# Patient Record
Sex: Female | Born: 1960 | Race: Black or African American | Hispanic: No | Marital: Married | Smoking: Current some day smoker
Health system: Southern US, Community
[De-identification: ages and names within clinical notes are randomized; demographics above are authoritative.]

## PROBLEM LIST (undated history)

## (undated) ENCOUNTER — Emergency Department (HOSPITAL_COMMUNITY): Admission: EM | Payer: Medicare Other | Source: Home / Self Care

## (undated) DIAGNOSIS — I10 Essential (primary) hypertension: Secondary | ICD-10-CM

## (undated) DIAGNOSIS — I5032 Chronic diastolic (congestive) heart failure: Secondary | ICD-10-CM

## (undated) DIAGNOSIS — F32A Depression, unspecified: Secondary | ICD-10-CM

## (undated) DIAGNOSIS — M1990 Unspecified osteoarthritis, unspecified site: Secondary | ICD-10-CM

## (undated) DIAGNOSIS — N189 Chronic kidney disease, unspecified: Secondary | ICD-10-CM

## (undated) DIAGNOSIS — J449 Chronic obstructive pulmonary disease, unspecified: Secondary | ICD-10-CM

## (undated) DIAGNOSIS — E0590 Thyrotoxicosis, unspecified without thyrotoxic crisis or storm: Secondary | ICD-10-CM

## (undated) DIAGNOSIS — E039 Hypothyroidism, unspecified: Secondary | ICD-10-CM

## (undated) DIAGNOSIS — I219 Acute myocardial infarction, unspecified: Secondary | ICD-10-CM

## (undated) DIAGNOSIS — F419 Anxiety disorder, unspecified: Secondary | ICD-10-CM

## (undated) DIAGNOSIS — F329 Major depressive disorder, single episode, unspecified: Secondary | ICD-10-CM

## (undated) DIAGNOSIS — G459 Transient cerebral ischemic attack, unspecified: Secondary | ICD-10-CM

## (undated) DIAGNOSIS — E7800 Pure hypercholesterolemia, unspecified: Secondary | ICD-10-CM

## (undated) HISTORY — PX: OTHER SURGICAL HISTORY: SHX169

## (undated) HISTORY — DX: Pure hypercholesterolemia, unspecified: E78.00

## (undated) HISTORY — DX: Transient cerebral ischemic attack, unspecified: G45.9

## (undated) HISTORY — DX: Thyrotoxicosis, unspecified without thyrotoxic crisis or storm: E05.90

## (undated) HISTORY — DX: Depression, unspecified: F32.A

## (undated) HISTORY — DX: Essential (primary) hypertension: I10

## (undated) HISTORY — DX: Anxiety disorder, unspecified: F41.9

## (undated) HISTORY — DX: Acute myocardial infarction, unspecified: I21.9

## (undated) HISTORY — DX: Major depressive disorder, single episode, unspecified: F32.9

## (undated) HISTORY — PX: BREAST CYST EXCISION: SHX579

## (undated) HISTORY — PX: THYROIDECTOMY: SHX17

## (undated) HISTORY — DX: Chronic obstructive pulmonary disease, unspecified: J44.9

## (undated) HISTORY — PX: KNEE SURGERY: SHX244

## (undated) HISTORY — PX: COLONOSCOPY: SHX174

## (undated) HISTORY — DX: Chronic kidney disease, unspecified: N18.9

---

## 1997-08-01 HISTORY — PX: CORONARY STENT PLACEMENT: SHX1402

## 2013-08-01 DIAGNOSIS — N200 Calculus of kidney: Secondary | ICD-10-CM

## 2013-08-01 HISTORY — DX: Calculus of kidney: N20.0

## 2014-12-03 ENCOUNTER — Telehealth: Payer: Self-pay | Admitting: Cardiology

## 2014-12-03 NOTE — Telephone Encounter (Signed)
Received records from [place] for appointment with Dr Martinique on 01/16/15.  Records given to [facility] (medical records) for Dr Doug Sou schedule on 01/16/15. lp

## 2015-01-14 ENCOUNTER — Ambulatory Visit (INDEPENDENT_AMBULATORY_CARE_PROVIDER_SITE_OTHER): Payer: Medicaid Other | Admitting: Cardiology

## 2015-01-14 ENCOUNTER — Encounter: Payer: Self-pay | Admitting: Cardiology

## 2015-01-14 VITALS — BP 120/60 | HR 73 | Ht 64.0 in | Wt 193.0 lb

## 2015-01-14 DIAGNOSIS — I2510 Atherosclerotic heart disease of native coronary artery without angina pectoris: Secondary | ICD-10-CM | POA: Insufficient documentation

## 2015-01-14 DIAGNOSIS — E7800 Pure hypercholesterolemia, unspecified: Secondary | ICD-10-CM

## 2015-01-14 DIAGNOSIS — I5022 Chronic systolic (congestive) heart failure: Secondary | ICD-10-CM

## 2015-01-14 DIAGNOSIS — Z720 Tobacco use: Secondary | ICD-10-CM | POA: Diagnosis not present

## 2015-01-14 DIAGNOSIS — I25118 Atherosclerotic heart disease of native coronary artery with other forms of angina pectoris: Secondary | ICD-10-CM | POA: Diagnosis not present

## 2015-01-14 DIAGNOSIS — I10 Essential (primary) hypertension: Secondary | ICD-10-CM | POA: Diagnosis not present

## 2015-01-14 MED ORDER — ISOSORBIDE MONONITRATE ER 30 MG PO TB24: 30.0000 mg | ORAL_TABLET | Freq: Every day | ORAL | Status: DC

## 2015-01-14 NOTE — Patient Instructions (Addendum)
Continue your current therapy  Start Imdur 30 mg daily  Stop smoking  We will schedule you for an Echocardiogram  We will request your lab work from primary care and the cath films and records from [place].  Dr Martinique recommends that you schedule a follow-up appointment in 2 months.

## 2015-01-14 NOTE — Progress Notes (Addendum)
Cardiology Office Note   Date:  01/14/2015   ID:  Jocelyn [name], DOB [birth year], MRN [id number]  PCP:  Benito Mccreedy, MD  Cardiologist:   Jacalyn Biggs Martinique, MD   Chief Complaint  Patient presents with  . Chest Pain      History of Present Illness: Jocelyn [name] is a 54 y.o. female who presents for evaluation of chest pain. She has a history of CAD with prior MI in 1999. She apparently had a stent placed at that time. She reports several heart cath since then at several places but she cannot remember details. Was admitted in February of this year in [place] and reports cardiac cath showed occlusion of the vessel that was stented with collateral flow. Medical therapy was recommended. She reports symptoms of chest heaviness, SOB, and left shoulder and arm pain. Not really related to activity. Some relief with sl Ntg. She states she has been told she has CHF. EF is unknown. She also reports a history of TIA 1.5 years ago. She has a history of HTN, hypercholesterolemia, and tobacco abuse. Still smokes at least 0.5 pk/day. She does tire easily. She is under a lot of stress with recent separation from her husband. No edema.   Past Medical History  Diagnosis Date  . Myocardial infarction   . Thyrotoxicosis   . TIA (transient ischemic attack)   . HTN (hypertension)   . Hypercholesterolemia   . CHF (congestive heart failure)     Past Surgical History  Procedure Laterality Date  . Partial hysterec    . Knee surgery    . Thyroidectomy    . Breast cyst excision    . Coronary stent placement  1999     Current Outpatient Prescriptions  Medication Sig Dispense Refill  . ALPRAZolam (XANAX) 1 MG tablet 1 mg.  0  . aspirin 325 MG tablet Take 325 mg by mouth daily.    Marland Kitchen doxepin (SINEQUAN) 10 MG capsule 10 mg.  0  . estradiol (ESTRACE) 0.5 MG tablet Take 0.5 mg by mouth daily.  1  . FLUoxetine (PROZAC) 10 MG capsule Take 10 mg by mouth.  0  . furosemide (LASIX) 40 MG tablet Take 40 mg by  mouth daily.  1  . KLOR-CON M10 10 MEQ tablet Take 10 mEq by mouth 2 (two) times daily.  0  . metoprolol tartrate (LOPRESSOR) 25 MG tablet Take 12.5 mg by mouth 2 (two) times daily.  2  . simvastatin (ZOCOR) 20 MG tablet Take 20 mg by mouth at bedtime.  1  . spironolactone (ALDACTONE) 25 MG tablet Take 25 mg by mouth 2 (two) times daily.  1  . isosorbide mononitrate (IMDUR) 30 MG 24 hr tablet Take 1 tablet (30 mg total) by mouth daily. 30 tablet 11   No current facility-administered medications for this visit.    Allergies:   Review of patient's allergies indicates not on file.    Social History:  The patient  reports that she has been smoking Cigarettes.  She has been smoking about 0.50 packs per day. She does not have any smokeless tobacco history on file.   Family History:  The patient's family history is significant for CAD and HTN.   ROS:  Please see the history of present illness.   Otherwise, review of systems are positive for none.   All other systems are reviewed and negative.    PHYSICAL EXAM: VS:  BP 120/60 mmHg  Pulse 73  Ht 5\' 4"  (1.626 m)  Wt 87.544 kg (193 lb)  BMI 33.11 kg/m2 , BMI Body mass index is 33.11 kg/(m^2). GEN: Well nourished, well developed, in no acute distress HEENT: normal Neck: no JVD, carotid bruits, or masses Cardiac: RRR; no murmurs, rubs, or gallops,no edema  Respiratory:  clear to auscultation bilaterally, normal work of breathing GI: soft, nontender, nondistended, + BS MS: no deformity or atrophy Skin: warm and dry, no rash Neuro:  Strength and sensation are intact Psych: euthymic mood, full affect   EKG:  EKG is ordered today. The ekg ordered today demonstrates NSR with early transition. Otherwise normal. I have personally reviewed and interpreted this study.    Recent Labs: No results found for requested labs within last 365 days.    Lipid Panel No results found for: CHOL, TRIG, HDL, CHOLHDL, VLDL, LDLCALC, LDLDIRECT    Wt  Readings from Last 3 Encounters:  01/14/15 87.544 kg (193 lb)      Other studies Reviewed: Additional studies/ records that were reviewed today include: none. Review of the above records demonstrates: N/A   ASSESSMENT AND PLAN:  1.  CAD with class 2 symptoms of angina. On lopressor. Will add Imdur 30 mg daily. Request records from last heart cath including cath films to review to see if she would be a candidate for CTO PCI. Continue statin and ASA therapy  2. Hypercholesterolemia. Request most recent lab work from primary care. On statin.   3. Hypertension. Controlled  4. Tobacco abuse. Counseled on smoking cessation.  5. CHF chronic. EF is unknown. Will schedule for Echocardiogram. Continue beta blocker, lasix, and aldactone. If EF low consider ACEi/ ARB. She does not appear volume overloaded today.   Current medicines are reviewed at length with the patient today.  The patient does not have concerns regarding medicines.  The following changes have been made:  See above  Labs/ tests ordered today include:  Orders Placed This Encounter  Procedures  . EKG 12-Lead  . ECHOCARDIOGRAM COMPLETE     Disposition:   FU with me  in 2 months  Signed, Faydra Korman Martinique, [place]  01/14/2015 3:28 PM    [facility] [address] [place], [place], [address] Phone [phone], Fax [phone]   I received and reviewed cardiac cath films from [facility] center from 10/29/14. She has occlusion of the first OM. There are some left to left collaterals. The vessel is small and diffusely diseased. Otherwise no significant disease. This does not appear suitable for CTO PCI due to diffusely diseased distal vessel.  Gennifer Potenza Martinique MD, [facility]  06/10/2015 2:28 PM

## 2015-01-16 ENCOUNTER — Ambulatory Visit: Payer: Self-pay | Admitting: Cardiology

## 2015-01-19 ENCOUNTER — Inpatient Hospital Stay (HOSPITAL_COMMUNITY): Admission: RE | Admit: 2015-01-19 | Payer: Medicaid Other | Source: Ambulatory Visit

## 2015-01-23 ENCOUNTER — Telehealth (HOSPITAL_COMMUNITY): Payer: Self-pay | Admitting: *Deleted

## 2015-01-27 ENCOUNTER — Telehealth (HOSPITAL_COMMUNITY): Payer: Self-pay | Admitting: *Deleted

## 2015-02-09 ENCOUNTER — Ambulatory Visit (HOSPITAL_COMMUNITY)
Admission: RE | Admit: 2015-02-09 | Discharge: 2015-02-09 | Disposition: A | Discharge status: Home / Self Care | Payer: 59 | Source: Ambulatory Visit | Attending: Cardiovascular Disease | Admitting: Cardiovascular Disease

## 2015-02-09 DIAGNOSIS — I10 Essential (primary) hypertension: Secondary | ICD-10-CM

## 2015-02-09 DIAGNOSIS — I25118 Atherosclerotic heart disease of native coronary artery with other forms of angina pectoris: Secondary | ICD-10-CM | POA: Diagnosis not present

## 2015-02-09 DIAGNOSIS — I2510 Atherosclerotic heart disease of native coronary artery without angina pectoris: Secondary | ICD-10-CM | POA: Diagnosis not present

## 2015-02-09 DIAGNOSIS — I5022 Chronic systolic (congestive) heart failure: Secondary | ICD-10-CM

## 2015-02-11 ENCOUNTER — Telehealth: Payer: Self-pay | Admitting: Cardiology

## 2015-02-11 NOTE — Telephone Encounter (Signed)
Received records from [facility] however did not receive disc of Echo images that was requested per Dr Martinique..  Re Faxed and ask when we would receive.  lp

## 2015-02-11 NOTE — Telephone Encounter (Signed)
Re faxed release to get Disc for upcoming appointment on 03/18/15 with Dr Martinique.

## 2015-02-11 NOTE — Telephone Encounter (Signed)
Received records from [facility] for appointment on 03/18/15 with Dr Martinique.  Records given to C. Pugh, RN for Dr Doug Sou schedule on 02/11/15. lp

## 2015-03-06 ENCOUNTER — Encounter: Payer: Self-pay | Admitting: *Deleted

## 2015-03-18 ENCOUNTER — Encounter: Payer: Self-pay | Admitting: Cardiology

## 2015-03-18 ENCOUNTER — Ambulatory Visit (INDEPENDENT_AMBULATORY_CARE_PROVIDER_SITE_OTHER): Payer: Medicaid Other | Admitting: Cardiology

## 2015-03-18 VITALS — BP 106/70 | HR 81 | Ht 63.0 in | Wt 187.0 lb

## 2015-03-18 DIAGNOSIS — Z720 Tobacco use: Secondary | ICD-10-CM | POA: Diagnosis not present

## 2015-03-18 DIAGNOSIS — E7800 Pure hypercholesterolemia, unspecified: Secondary | ICD-10-CM

## 2015-03-18 DIAGNOSIS — I25118 Atherosclerotic heart disease of native coronary artery with other forms of angina pectoris: Secondary | ICD-10-CM | POA: Diagnosis not present

## 2015-03-18 DIAGNOSIS — I10 Essential (primary) hypertension: Secondary | ICD-10-CM | POA: Diagnosis not present

## 2015-03-18 NOTE — Progress Notes (Signed)
Cardiology Office Note   Date:  03/18/2015   ID:  Jocelyn [name], DOB [birth year], MRN [id number]  PCP:  Benito Mccreedy, MD  Cardiologist:   Veva Grimley Martinique, MD   Chief Complaint  Patient presents with  . Follow-up    2 mo/one time visit  . Chest Pain    running down left arm/ burning feeling in chest area, pain in arm  . Shortness of Breath    constant  . Dizziness    frequent dizziness, can happen even when just sitting  . Fatigue    pt states 98% of the time  . Weight Loss    has lost 70 lbs in 6 months/ pt states shes not trying to lose weight      History of Present Illness: Jocelyn [name] is a 54 y.o. female is seen for follow up CAD. She has a history of CAD with prior MI in 45. She  had a stent placed at that time. She reports several heart cath since then at several places but she cannot remember details. Was admitted in March of this year in [place]. She had an Echo that showed mild inferior HK with normal EF. Myoview showed a lateral scar with some ischemia in the inferior wall. EF 55%. Cardiac cath showed occlusion of the first OM at site of prior stent. There were some left to left collaterals.  Medical therapy was recommended.  On follow up today she reports symptoms of chest pain intermittently. This is described as burning in her throat, chest, and left arm. Not really related to activity. She has not tried sl Ntg. Takes antacids and states discomfort only lasts 3 minutes.  She states she has been told she has CHF.  She also reports a history of TIA 1.5 years ago. She has a history of HTN, hypercholesterolemia, and tobacco abuse. Still smokes at least 0.5 pk/day. She does tire easily. She is under a lot of stress with recent separation from her husband. No edema.   Past Medical History  Diagnosis Date  . Myocardial infarction   . Thyrotoxicosis   . TIA (transient ischemic attack)   . HTN (hypertension)   . Hypercholesterolemia   . CHF (congestive heart failure)      Past Surgical History  Procedure Laterality Date  . Partial hysterec    . Knee surgery    . Thyroidectomy    . Breast cyst excision    . Coronary stent placement  1999     Current Outpatient Prescriptions  Medication Sig Dispense Refill  . ALPRAZolam (XANAX) 0.5 MG tablet Take 1 tablet by mouth 3 (three) times daily.  1  . aspirin 325 MG tablet Take 325 mg by mouth daily.    Marland Kitchen doxepin (SINEQUAN) 10 MG capsule 10 mg.  0  . estradiol (ESTRACE) 0.5 MG tablet Take 0.5 mg by mouth daily.  1  . FLUoxetine (PROZAC) 10 MG capsule Take 10 mg by mouth.  0  . isosorbide mononitrate (IMDUR) 30 MG 24 hr tablet Take 1 tablet (30 mg total) by mouth daily. 30 tablet 11  . metoprolol tartrate (LOPRESSOR) 25 MG tablet Take 12.5 mg by mouth 2 (two) times daily.  2  . simvastatin (ZOCOR) 20 MG tablet Take 20 mg by mouth at bedtime.  1  . spironolactone (ALDACTONE) 25 MG tablet Take 25 mg by mouth 2 (two) times daily.  1   No current facility-administered medications for this visit.    Allergies:   Review  of patient's allergies indicates not on file.    Social History:  The patient  reports that she has been smoking Cigarettes.  She has been smoking about 0.50 packs per day. She does not have any smokeless tobacco history on file.   Family History:  The patient's family history is significant for CAD and HTN.   ROS:  Please see the history of present illness.   Otherwise, review of systems are positive for none.   All other systems are reviewed and negative.    PHYSICAL EXAM: VS:  BP 106/70 mmHg  Pulse 81  Ht 5\' 3"  (1.6 m)  Wt 84.823 kg (187 lb)  BMI 33.13 kg/m2 , BMI Body mass index is 33.13 kg/(m^2). GEN: Well nourished, well developed, in no acute distress HEENT: normal Neck: no JVD, carotid bruits, or masses Cardiac: RRR; no murmurs, rubs, or gallops,no edema  Respiratory:  clear to auscultation bilaterally, normal work of breathing GI: soft, nontender, nondistended, + BS MS: no  deformity or atrophy Skin: warm and dry, no rash Neuro:  Strength and sensation are intact Psych: euthymic mood, full affect   EKG:  EKG is ordered today. The ekg ordered today demonstrates NSR with early transition. Otherwise normal. I have personally reviewed and interpreted this study.    Recent Labs: No results found for requested labs within last 365 days.    Lipid Panel No results found for: CHOL, TRIG, HDL, CHOLHDL, VLDL, LDLCALC, LDLDIRECT    Wt Readings from Last 3 Encounters:  03/18/15 84.823 kg (187 lb)  01/14/15 87.544 kg (193 lb)      Other studies Reviewed: Additional studies/ records that were reviewed today include: Complete hospital records from [place]. Review of the above records demonstrates: as noted above. Also BNP was 116. LV EDP was 10 at time of cath.    ASSESSMENT AND PLAN:  1.  CAD with class 2 symptoms of angina. On lopressor and  Imdur 30 mg daily. Request films from last heart cath  to review to see if she would be a candidate for CTO PCI but given that this was an OM branch and previously stented it will most likely be treated medically. Continue statin and ASA therapy  2. Hypercholesterolemia.  On statin.   3. Hypertension. Controlled  4. Tobacco abuse. Counseled on smoking cessation.  5. ? History of CHF. Echo is really normal. She states she has taken lasix since the 1990s but I doubt this is needed. Will stop lasix and potassium. Continue aldactone for now.    Current medicines are reviewed at length with the patient today.  The patient does not have concerns regarding medicines.  The following changes have been made:  See above  Labs/ tests ordered today include:  No orders of the defined types were placed in this encounter.     Disposition:   FU with me  in 6  months  Signed, Eberardo Demello Martinique, [place]  03/18/2015 5:24 PM    [facility] [address], [place], [place], [address] Phone [phone], Fax  [phone]

## 2015-03-18 NOTE — Patient Instructions (Signed)
Stop taking lasix and potassium  Continue your other medications  I will see you in 6 months

## 2015-08-21 ENCOUNTER — Emergency Department (HOSPITAL_COMMUNITY): Payer: Medicaid Other

## 2015-08-21 ENCOUNTER — Inpatient Hospital Stay (HOSPITAL_COMMUNITY)
Admission: EM | Admit: 2015-08-21 | Discharge: 2015-08-24 | DRG: 287 | Disposition: A | Discharge status: Home / Self Care | Payer: Medicaid Other | Attending: Cardiology | Admitting: Cardiology

## 2015-08-21 ENCOUNTER — Encounter (HOSPITAL_COMMUNITY): Payer: Self-pay | Admitting: *Deleted

## 2015-08-21 DIAGNOSIS — I252 Old myocardial infarction: Secondary | ICD-10-CM | POA: Diagnosis not present

## 2015-08-21 DIAGNOSIS — Z8673 Personal history of transient ischemic attack (TIA), and cerebral infarction without residual deficits: Secondary | ICD-10-CM | POA: Diagnosis not present

## 2015-08-21 DIAGNOSIS — Z720 Tobacco use: Secondary | ICD-10-CM | POA: Diagnosis present

## 2015-08-21 DIAGNOSIS — I25118 Atherosclerotic heart disease of native coronary artery with other forms of angina pectoris: Secondary | ICD-10-CM

## 2015-08-21 DIAGNOSIS — I509 Heart failure, unspecified: Secondary | ICD-10-CM | POA: Diagnosis present

## 2015-08-21 DIAGNOSIS — Z7982 Long term (current) use of aspirin: Secondary | ICD-10-CM | POA: Diagnosis not present

## 2015-08-21 DIAGNOSIS — I10 Essential (primary) hypertension: Secondary | ICD-10-CM | POA: Diagnosis not present

## 2015-08-21 DIAGNOSIS — E039 Hypothyroidism, unspecified: Secondary | ICD-10-CM | POA: Diagnosis present

## 2015-08-21 DIAGNOSIS — E7800 Pure hypercholesterolemia, unspecified: Secondary | ICD-10-CM | POA: Diagnosis present

## 2015-08-21 DIAGNOSIS — F17210 Nicotine dependence, cigarettes, uncomplicated: Secondary | ICD-10-CM | POA: Diagnosis present

## 2015-08-21 DIAGNOSIS — I200 Unstable angina: Secondary | ICD-10-CM | POA: Diagnosis not present

## 2015-08-21 DIAGNOSIS — R079 Chest pain, unspecified: Secondary | ICD-10-CM | POA: Diagnosis not present

## 2015-08-21 DIAGNOSIS — I472 Ventricular tachycardia: Secondary | ICD-10-CM | POA: Diagnosis present

## 2015-08-21 DIAGNOSIS — I110 Hypertensive heart disease with heart failure: Secondary | ICD-10-CM | POA: Diagnosis present

## 2015-08-21 DIAGNOSIS — Z79899 Other long term (current) drug therapy: Secondary | ICD-10-CM | POA: Diagnosis not present

## 2015-08-21 DIAGNOSIS — Z7951 Long term (current) use of inhaled steroids: Secondary | ICD-10-CM | POA: Diagnosis not present

## 2015-08-21 DIAGNOSIS — I25110 Atherosclerotic heart disease of native coronary artery with unstable angina pectoris: Secondary | ICD-10-CM | POA: Diagnosis present

## 2015-08-21 DIAGNOSIS — E785 Hyperlipidemia, unspecified: Secondary | ICD-10-CM | POA: Diagnosis present

## 2015-08-21 DIAGNOSIS — T82855A Stenosis of coronary artery stent, initial encounter: Secondary | ICD-10-CM | POA: Diagnosis present

## 2015-08-21 DIAGNOSIS — E0590 Thyrotoxicosis, unspecified without thyrotoxic crisis or storm: Secondary | ICD-10-CM | POA: Diagnosis present

## 2015-08-21 DIAGNOSIS — Y838 Other surgical procedures as the cause of abnormal reaction of the patient, or of later complication, without mention of misadventure at the time of the procedure: Secondary | ICD-10-CM | POA: Diagnosis present

## 2015-08-21 DIAGNOSIS — I2510 Atherosclerotic heart disease of native coronary artery without angina pectoris: Secondary | ICD-10-CM | POA: Insufficient documentation

## 2015-08-21 HISTORY — DX: Hypothyroidism, unspecified: E03.9

## 2015-08-21 HISTORY — DX: Chronic diastolic (congestive) heart failure: I50.32

## 2015-08-21 LAB — BASIC METABOLIC PANEL
ANION GAP: 8 (ref 5–15)
BUN: 10 mg/dL (ref 6–20)
CHLORIDE: 104 mmol/L (ref 101–111)
CO2: 27 mmol/L (ref 22–32)
Calcium: 9.6 mg/dL (ref 8.9–10.3)
Creatinine, Ser: 0.92 mg/dL (ref 0.44–1.00)
GFR calc Af Amer: >60 mL/min (ref >60)
GFR calc non Af Amer: >60 mL/min (ref >60)
Glucose, Bld: 92 mg/dL (ref 65–99)
POTASSIUM: 4.5 mmol/L (ref 3.5–5.1)
SODIUM: 139 mmol/L (ref 135–145)

## 2015-08-21 LAB — I-STAT TROPONIN, ED
TROPONIN I, POC: 0 ng/mL (ref 0.00–0.08)
Troponin i, poc: 0.01 ng/mL (ref 0.00–0.08)

## 2015-08-21 LAB — MAGNESIUM: MAGNESIUM: 1.9 mg/dL (ref 1.7–2.4)

## 2015-08-21 LAB — CBC
HEMATOCRIT: 42 % (ref 36.0–46.0)
HEMOGLOBIN: 14.6 g/dL (ref 12.0–15.0)
MCH: 30.7 pg (ref 26.0–34.0)
MCHC: 34.8 g/dL (ref 30.0–36.0)
MCV: 88.4 fL (ref 78.0–100.0)
Platelets: 253 10*3/uL (ref 150–400)
RBC: 4.75 MIL/uL (ref 3.87–5.11)
RDW: 13 % (ref 11.5–15.5)
WBC: 7.6 10*3/uL (ref 4.0–10.5)

## 2015-08-21 LAB — PHOSPHORUS: PHOSPHORUS: 3.1 mg/dL (ref 2.5–4.6)

## 2015-08-21 LAB — PROTIME-INR
INR: 1.08 (ref 0.00–1.49)
PROTHROMBIN TIME: 14.2 s (ref 11.6–15.2)

## 2015-08-21 MED ORDER — HEPARIN BOLUS VIA INFUSION
4000.0000 [IU] | Freq: Once | INTRAVENOUS | Status: AC
  Administered 2015-08-21: 4000 [IU] via INTRAVENOUS
  Filled 2015-08-21: qty 4000

## 2015-08-21 MED ORDER — HEPARIN (PORCINE) IN NACL 100-0.45 UNIT/ML-% IJ SOLN
900.0000 [IU]/h | INTRAMUSCULAR | Status: DC
  Administered 2015-08-21: 900 [IU]/h via INTRAVENOUS
  Filled 2015-08-21 (×2): qty 250

## 2015-08-21 MED ORDER — ASPIRIN 81 MG PO CHEW
324.0000 mg | CHEWABLE_TABLET | Freq: Once | ORAL | Status: AC
  Administered 2015-08-21: 324 mg via ORAL
  Filled 2015-08-21: qty 4

## 2015-08-21 MED ORDER — ALUM & MAG HYDROXIDE-SIMETH 200-200-20 MG/5ML PO SUSP
30.0000 mL | Freq: Once | ORAL | Status: AC
  Administered 2015-08-21: 30 mL via ORAL
  Filled 2015-08-21: qty 30

## 2015-08-21 NOTE — ED Provider Notes (Signed)
CSN: [id number]     Arrival date & time 08/21/15  1221 History   First MD Initiated Contact with Patient 08/21/15 1313     Chief Complaint  Patient presents with  . Chest Pain     (Consider location/radiation/quality/duration/timing/severity/associated sxs/prior Treatment) Patient is a 55 y.o. female presenting with chest pain. The history is provided by the patient.  Chest Pain Pain location:  L chest Pain quality: burning and pressure   Pain radiates to:  L arm Pain radiates to the back: no   Pain severity:  Mild Onset quality:  Gradual Duration:  1 month Timing:  Intermittent Progression:  Waxing and waning Chronicity:  New Context: movement and at rest   Context: not breathing, no stress and no trauma   Relieved by:  Rest and nitroglycerin Worsened by:  Exertion Ineffective treatments:  None tried Associated symptoms: no abdominal pain, no altered mental status, no cough, no fever, no lower extremity edema, no nausea, no shortness of breath and not vomiting   Risk factors: coronary artery disease     Past Medical History  Diagnosis Date  . Myocardial infarction ([place])   . Thyrotoxicosis   . TIA (transient ischemic attack)   . HTN (hypertension)   . Hypercholesterolemia   . CHF (congestive heart failure) [facility])    Past Surgical History  Procedure Laterality Date  . Partial hysterec    . Knee surgery    . Thyroidectomy    . Breast cyst excision    . Coronary stent placement  1999   Family History  Problem Relation Age of Onset  . Heart disease Mother   . Heart disease Father   . Cancer Father    Social History  Substance Use Topics  . Smoking status: Current Every Day Smoker -- 0.50 packs/day    Types: Cigarettes  . Smokeless tobacco: None  . Alcohol Use: None   OB History    No data available     Review of Systems  Constitutional: Negative for fever.  HENT: Negative.   Eyes: Negative for visual disturbance.  Respiratory: Negative for cough and  shortness of breath.   Cardiovascular: Positive for chest pain.  Gastrointestinal: Negative for nausea, vomiting and abdominal pain.  Genitourinary: Negative.   Musculoskeletal: Negative.   Skin: Negative.   Neurological: Negative.       Allergies  Review of patient's allergies indicates no known allergies.  Home Medications   Prior to Admission medications   Medication Sig Start Date End Date Taking? Authorizing Provider  albuterol (PROVENTIL HFA;VENTOLIN HFA) 108 (90 Base) MCG/ACT inhaler Inhale 2 puffs into the lungs every 4 (four) hours as needed. 06/21/15 06/20/16 Yes Historical Provider, MD  ALPRAZolam [name]) 0.5 MG tablet Take 1 mg by mouth 3 (three) times daily as needed for anxiety or sleep.  02/27/15  Yes Historical Provider, MD  aspirin 325 MG tablet Take 325 mg by mouth daily.   Yes Historical Provider, MD  Cholecalciferol (VITAMIN D-3) 1000 units CAPS Take 1 capsule by mouth daily.   Yes Historical Provider, MD  cyclobenzaprine (FLEXERIL) 10 MG tablet Take 10 mg by mouth daily.  06/30/15  Yes Historical Provider, MD  doxepin (SINEQUAN) 10 MG capsule Take 10-20 mg by mouth at bedtime as needed (for sleep).  01/02/15  Yes Historical Provider, MD  estradiol (ESTRACE) 0.5 MG tablet Take 0.5 mg by mouth daily. 01/05/15  Yes Historical Provider, MD  FLUoxetine (PROZAC) 20 MG capsule Take 20 mg by mouth daily as  needed.  07/27/15  Yes Historical Provider, MD  furosemide (LASIX) 40 MG tablet Take 40 mg by mouth daily as needed for fluid.   Yes Historical Provider, MD  isosorbide mononitrate (IMDUR) 30 MG 24 hr tablet Take 1 tablet (30 mg total) by mouth daily. 01/14/15  Yes Peter M Martinique, MD  KLOR-CON M10 10 MEQ tablet Take 10 mEq by mouth 2 (two) times daily. 07/06/15  Yes Historical Provider, MD  levothyroxine (SYNTHROID, LEVOTHROID) 25 MCG tablet Take 25 mcg by mouth daily. 07/06/15  Yes Historical Provider, MD  metoprolol tartrate (LOPRESSOR) 25 MG tablet Take 12.5 mg by mouth 2  (two) times daily. 01/03/15  Yes Historical Provider, MD  simvastatin (ZOCOR) 20 MG tablet Take 20 mg by mouth at bedtime. 01/03/15  Yes Historical Provider, MD  spironolactone (ALDACTONE) 25 MG tablet Take 25 mg by mouth 2 (two) times daily. 01/03/15  Yes Historical Provider, MD  triamcinolone cream (KENALOG) 0.5 % Apply 1 application topically daily as needed (for sun exposure).   Yes Historical Provider, MD   BP 117/79 mmHg  Pulse 84  Temp(Src) 98 F (36.7 C) (Oral)  Resp 23  Ht 5\' 4"  (1.626 m)  Wt 91.173 kg  BMI 34.48 kg/m2  SpO2 100% Physical Exam  Constitutional: She is oriented to person, place, and time. She appears well-developed and well-nourished. No distress.  HENT:  Head: Atraumatic.  Mouth/Throat: No oropharyngeal exudate.  Eyes: Pupils are equal, round, and reactive to light. No scleral icterus.  Neck: Normal range of motion. Neck supple. No JVD present.  Cardiovascular: Normal rate, regular rhythm, normal heart sounds and intact distal pulses.  Exam reveals no gallop and no friction rub.   No murmur heard. Pulmonary/Chest: Effort normal and breath sounds normal. No respiratory distress. She has no wheezes. She has no rales.  Abdominal: Soft. She exhibits no distension. There is no tenderness. There is no rebound and no guarding.  Musculoskeletal: Normal range of motion. She exhibits no edema.  Neurological: She is alert and oriented to person, place, and time. No cranial nerve deficit. She exhibits normal muscle tone. Coordination normal.  Skin: Skin is warm and dry. No rash noted. She is not diaphoretic. No erythema. No pallor.  Psychiatric: She has a normal mood and affect.  Nursing note and vitals reviewed.   ED Course  Procedures (including critical care time) Labs Review Labs Reviewed  BASIC METABOLIC PANEL  CBC  MAGNESIUM  PHOSPHORUS  PROTIME-INR  CBC  HEPARIN LEVEL (UNFRACTIONATED)  I-STAT TROPOININ, ED  Randolm Idol, ED    Imaging Review Dg Chest  2 View  08/21/2015  CLINICAL DATA:  Acute chest pain. EXAM: CHEST  2 VIEW COMPARISON:  None. FINDINGS: The heart size and mediastinal contours are within normal limits. Both lungs are clear. No pneumothorax or pleural effusion is noted. The visualized skeletal structures are unremarkable. IMPRESSION: No active cardiopulmonary disease. Electronically Signed   By: Marijo Conception, M.D.   On: 08/21/2015 12:56   I have personally reviewed and evaluated these images and lab results as part of my medical decision-making.   EKG Interpretation   Date/Time:  Friday August 21 2015 12:25:21 EST Ventricular Rate:  89 PR Interval:  150 QRS Duration: 82 QT Interval:  380 QTC Calculation: 462 R Axis:   58 Text Interpretation:  Normal sinus rhythm Possible Lateral infarct , age  undetermined Abnormal ECG No old tracing to compare Confirmed by KNAPP   MD-J, JON [id number]) on 08/21/2015 1:16:30 PM  MDM   Final diagnoses:  Chest pain, unspecified chest pain type  Coronary artery disease involving native coronary artery with other forms of angina pectoris [facility])    Patient is a 55 year old female with a history of CAD who presents with intermittent left-sided chest pain that radiates to her throat and left arm with some mild shortness of breath. This been going on for about a month but is increased in frequency and severity and now unrelieved with nitroglycerin. She has a high heart score but a negative troponin. EKG unchanged. Given continued pain despite aspirin and 2 nitros at home, cardiology consult for admission for ACS rule out.   During stay, patient had a run of v-tach but spontaneously converted.  Patient care handed off to Dr. Bess Harvest at [facility]. F/u cardiology recs.    Heriberto Antigua, MD 08/21/15 2100

## 2015-08-21 NOTE — ED Provider Notes (Signed)
I saw and evaluated the patient, reviewed the resident's note and I agree with the findings and plan.  Patient presented to the emergency room with complaints of chest pain that was radiating to her throat and left arm. Some mild shortness of breath. No nausea or vomiting.  She is given a dose of nitroglycerin with resolution of the pain.  She has known history of coronary artery disease. The symptoms feel similar. Plan on cardiac consultation.    EKG Interpretation   Date/Time:  Friday August 21 2015 12:25:21 EST Ventricular Rate:  89 PR Interval:  150 QRS Duration: 82 QT Interval:  380 QTC Calculation: 462 R Axis:   58 Text Interpretation:  Normal sinus rhythm Possible Lateral infarct , age  undetermined Abnormal ECG No old tracing to compare Confirmed by Aylana Hirschfeld   MD-J, Esparanza Krider [phone]) on 08/21/2015 1:16:30 PM        Dorie Rank, MD 08/21/15 [phone]

## 2015-08-21 NOTE — ED Notes (Signed)
Pt reports intermittent chest pains that radiates into her throat and left arm. Reports mild sob, denies swelling to extremities. Has cardiac hx, reports temporary decrease in pain when taking nitro.

## 2015-08-21 NOTE — ED Provider Notes (Signed)
Patient care assumed at 3pm.  Briefly, 1 month of chest pain that has been increasing in intensity and frequency. Today began having L sided chest pain that radiates to the L arm and jaw.  Worse with exertion and associated with sob. Patient has had 3 MI's. In the past.  Patient had a couple of PVC's.  Plan to admit for rule out.  Patient will be admitted to cards.  Awaiting step down bed.  Patient is on heparin gtt per cards.  Jarome Matin, MD 08/21/15 [id number]  Macarthur Critchley, MD 08/22/15 [id number]

## 2015-08-21 NOTE — Progress Notes (Signed)
ANTICOAGULATION CONSULT NOTE - Initial Consult  Pharmacy Consult for Heparin Indication: chest pain/ACS  No Known Allergies  Patient Measurements: Height: 5\' 4"  (162.6 cm) Weight: 201 lb (91.173 kg) IBW/kg (Calculated) : 54.7 Heparin Dosing Weight: 75.2  Vital Signs: Temp: 98 F (36.7 C) (01/20 1230) Temp Source: Oral (01/20 1230) BP: 117/79 mmHg (01/20 1745) Pulse Rate: 84 (01/20 1745)  Labs:  Recent Labs  08/21/15 1220  HGB 14.6  HCT 42.0  PLT 253  CREATININE 0.92    Estimated Creatinine Clearance: 76.5 mL/min (by C-G formula based on Cr of 0.92).   Medical History: Past Medical History  Diagnosis Date  . Myocardial infarction ([place])   . Thyrotoxicosis   . TIA (transient ischemic attack)   . HTN (hypertension)   . Hypercholesterolemia   . CHF (congestive heart failure) ([facility])     Medications:   (Not in a hospital admission) Scheduled:  Infusions:   Assessment: 55yo F w/ hx of coronary artery disease. Presented w/ intermittent chest pains that radiate into her throat and left arm, mild SOB, was given dose of nitro w/ resolution of pain. Pt had 5 beat run of V-tach and self-converted. Pharmacy consulted to dose heparin for ACS/chest pain. Trops 0.00, sCr 0.92, CrCl 76.5, Hgb 14.6, Plts 253, No s/sx of bleeding, not on PTA AC meds.  Goal of Therapy:  Heparin level 0.3-0.7 units/ml Monitor platelets by anticoagulation protocol: Yes   Plan:  Give 4000 units bolus x 1 Start heparin infusion at 900 units/hr Check anti-Xa level in 6 hours and daily while on heparin Continue to monitor H&H and platelets, s/sx of bleeding, clinical course,   Georgiann Mohs, PharmD Student   I agree with the assessment and plan   Hughes Better, PharmD, BCPS Clinical Pharmacist Pager: [phone] 08/21/2015 6:16 PM

## 2015-08-21 NOTE — ED Notes (Signed)
Report called to [phone] -- [phone]

## 2015-08-21 NOTE — ED Notes (Signed)
Pt had five beat run of V-tach and self converted.  Rhythm printed off and provided to MD.  Further orders placed.

## 2015-08-21 NOTE — H&P (Signed)
Admit date: 08/21/2015 Referring Physician: Dr. Marye Round Primary Cardiologist: Dr. Martinique Chief complaint/reason for admission:Chest pain  HPI: This is a 55yo AAF with a history of ASCAD and prior MI in 1999 at which time a stent was placed.  According to Dr. Doug Sou notes she has had several caths since then but does not remember details.  Myoview in the past showed lateral scar with some ischemia in the inferior wall and normal LVF.  Cath showed occluded OM at site of prior stent with left to left collaterals and she has been on medical therapy.    The patient presents today with complaints of chest pain that has been occurring for the past month intermittently.  She has a hard time describing the quality of the pain but it is left sided and radiates across into her left arm, jaw and between her shoulder blades.  It can occur with rest or with exertion.  She saw Dr. Martinique back in the summer with what sounds to be similar pain associated with dizziness.  She had pain this am and took SL NTG with really no relief and now presents to the ER.  While in the ER she has had several runs of nonsustained ventricular tachycardia.  Cardiology is now asked to admit for further evaluation       PMH:    Past Medical History  Diagnosis Date  . Myocardial infarction ([place])   . Thyrotoxicosis   . TIA (transient ischemic attack)   . HTN (hypertension)   . Hypercholesterolemia   . CHF (congestive heart failure) ([facility])     PSH:    Past Surgical History  Procedure Laterality Date  . Partial hysterec    . Knee surgery    . Thyroidectomy    . Breast cyst excision    . Coronary stent placement  1999    ALLERGIES:   Review of patient's allergies indicates no known allergies.  Prior to Admit Meds:   (Not in a hospital admission) Family HX:    Family History  Problem Relation Age of Onset  . Heart disease Mother   . Heart disease Father   . Cancer Father    Social HX:    Social History    Social History  . Marital Status: Legally Separated    Spouse Name: N/A  . Number of Children: 2  . Years of Education: N/A   Occupational History  . [employer]    Social History Main Topics  . Smoking status: Current Every Day Smoker -- 0.50 packs/day    Types: Cigarettes  . Smokeless tobacco: Not on file  . Alcohol Use: Not on file  . Drug Use: Not on file  . Sexual Activity: Not on file   Other Topics Concern  . Not on file   Social History Narrative     ROS:  All 11 ROS were addressed and are negative except what is stated in the HPI  PHYSICAL EXAM Filed Vitals:   08/21/15 2300 08/21/15 2323  BP: 93/58 90/60  Pulse: 89 86  Temp:    Resp: 18 16   General: Well developed, well nourished, in no acute distress Head: Eyes PERRLA, No xanthomas.   Normal cephalic and atramatic  Lungs:   Clear bilaterally to auscultation and percussion. Heart:   HRRR S1 S2 Pulses are 2+ & equal.            No carotid bruit. No JVD.  No abdominal bruits. No femoral bruits. Abdomen:  Bowel sounds are positive, abdomen soft and non-tender without masses  Extremities:   No clubbing, cyanosis or edema.  DP +1 Neuro: Alert and oriented X 3. Psych:  Good affect, responds appropriately   Labs:   Lab Results  Component Value Date   WBC 7.6 08/21/2015   HGB 14.6 08/21/2015   HCT 42.0 08/21/2015   MCV 88.4 08/21/2015   PLT 253 08/21/2015    Recent Labs Lab 08/21/15 1220  NA 139  K 4.5  CL 104  CO2 27  BUN 10  CREATININE 0.92  CALCIUM 9.6  GLUCOSE 92   No results found for: CKTOTAL, CKMB, CKMBINDEX, TROPONINI No results found for: PTT Lab Results  Component Value Date   INR 1.08 08/21/2015    No results found for: CHOL No results found for: HDL No results found for: LDLCALC No results found for: TRIG No results found for: CHOLHDL No results found for: LDLDIRECT    Radiology:  Dg Chest 2 View  08/21/2015  CLINICAL DATA:  Acute chest pain. EXAM: CHEST  2 VIEW COMPARISON:   None. FINDINGS: The heart size and mediastinal contours are within normal limits. Both lungs are clear. No pneumothorax or pleural effusion is noted. The visualized skeletal structures are unremarkable. IMPRESSION: No active cardiopulmonary disease. Electronically Signed   By: Marijo Conception, M.D.   On: 08/21/2015 12:56    EKG:  NSR with no ST changes  ASSESSMENT/PLAN: 1.  Chest pain that is somewhat atypical in that it seems to be worse at night and at rest but can come on with exertion as well . It does radiated into her jaw and shoulder and across her back.  NTG has not seemed to help.  It is somewhat different from her MI pain in the past in that it was pressure at that time.  She has had several runs of NSVT in the ER making coronary ischemia more concerning.  Will admit to stepdown to rule out MI.  Initial troponin is neg.  Start IV Heparin gtt.  Hold BB and NTG due to soft BP.  Continue ASA/statin.  Plan left heart cath on Monday to redefine coronary anatomy. 2.  ASCAD with several MIs in the past and known occlusion of OM1 at site of prior stent 3.  HTN - BP soft in the ER with SBP in the mid 90's 4.  Dyslipidemia - continue statin 5.  History of CHF with normal EF on last cath 6.  Nonsustained ventricular tachycardia - check 2D echo in am to assess LVF.   Sueanne Margarita, MD  08/21/2015  11:52 PM

## 2015-08-22 LAB — CBC WITH DIFFERENTIAL/PLATELET
BASOS PCT: 0 %
Basophils Absolute: 0 10*3/uL (ref 0.0–0.1)
EOS ABS: 0.1 10*3/uL (ref 0.0–0.7)
EOS PCT: 2 %
HEMATOCRIT: 38 % (ref 36.0–46.0)
Hemoglobin: 12.8 g/dL (ref 12.0–15.0)
Lymphocytes Relative: 54 %
Lymphs Abs: 4.3 10*3/uL — ABNORMAL HIGH (ref 0.7–4.0)
MCH: 29.6 pg (ref 26.0–34.0)
MCHC: 33.7 g/dL (ref 30.0–36.0)
MCV: 88 fL (ref 78.0–100.0)
Monocytes Absolute: 0.4 10*3/uL (ref 0.1–1.0)
Monocytes Relative: 5 %
NEUTROS PCT: 39 %
Neutro Abs: 3.1 10*3/uL (ref 1.7–7.7)
PLATELETS: 229 10*3/uL (ref 150–400)
RBC: 4.32 MIL/uL (ref 3.87–5.11)
RDW: 12.9 % (ref 11.5–15.5)
WBC: 8 10*3/uL (ref 4.0–10.5)

## 2015-08-22 LAB — COMPREHENSIVE METABOLIC PANEL
ALBUMIN: 3.2 g/dL — AB (ref 3.5–5.0)
ALK PHOS: 48 U/L (ref 38–126)
ALT: 13 U/L — ABNORMAL LOW (ref 14–54)
ANION GAP: 12 (ref 5–15)
AST: 18 U/L (ref 15–41)
BUN: 14 mg/dL (ref 6–20)
CHLORIDE: 103 mmol/L (ref 101–111)
CO2: 23 mmol/L (ref 22–32)
Calcium: 9.2 mg/dL (ref 8.9–10.3)
Creatinine, Ser: 1 mg/dL (ref 0.44–1.00)
GFR calc Af Amer: >60 mL/min (ref >60)
GFR calc non Af Amer: >60 mL/min (ref >60)
GLUCOSE: 121 mg/dL — AB (ref 65–99)
POTASSIUM: 3.7 mmol/L (ref 3.5–5.1)
SODIUM: 138 mmol/L (ref 135–145)
Total Bilirubin: 0.3 mg/dL (ref 0.3–1.2)
Total Protein: 5.9 g/dL — ABNORMAL LOW (ref 6.5–8.1)

## 2015-08-22 LAB — PROTIME-INR
INR: 1.18 (ref 0.00–1.49)
Prothrombin Time: 15.2 seconds (ref 11.6–15.2)

## 2015-08-22 LAB — HEPARIN LEVEL (UNFRACTIONATED)
HEPARIN UNFRACTIONATED: 0.55 [IU]/mL (ref 0.30–0.70)
Heparin Unfractionated: 0.64 IU/mL (ref 0.30–0.70)

## 2015-08-22 LAB — TROPONIN I

## 2015-08-22 LAB — CBC
HEMATOCRIT: 39.2 % (ref 36.0–46.0)
Hemoglobin: 13.4 g/dL (ref 12.0–15.0)
MCH: 29.8 pg (ref 26.0–34.0)
MCHC: 34.2 g/dL (ref 30.0–36.0)
MCV: 87.3 fL (ref 78.0–100.0)
PLATELETS: 248 10*3/uL (ref 150–400)
RBC: 4.49 MIL/uL (ref 3.87–5.11)
RDW: 13 % (ref 11.5–15.5)
WBC: 6.4 10*3/uL (ref 4.0–10.5)

## 2015-08-22 LAB — APTT: APTT: 101 s — AB (ref 24–37)

## 2015-08-22 LAB — MRSA PCR SCREENING: MRSA by PCR: NEGATIVE

## 2015-08-22 LAB — MAGNESIUM: Magnesium: 1.8 mg/dL (ref 1.7–2.4)

## 2015-08-22 LAB — TSH: TSH: 10.048 u[IU]/mL — ABNORMAL HIGH (ref 0.350–4.500)

## 2015-08-22 MED ORDER — VITAMIN D 1000 UNITS PO TABS
1000.0000 [IU] | ORAL_TABLET | Freq: Every day | ORAL | Status: DC
  Administered 2015-08-22 – 2015-08-24 (×3): 1000 [IU] via ORAL
  Filled 2015-08-22 (×3): qty 1

## 2015-08-22 MED ORDER — SPIRONOLACTONE 25 MG PO TABS
25.0000 mg | ORAL_TABLET | Freq: Two times a day (BID) | ORAL | Status: DC
  Administered 2015-08-22 – 2015-08-24 (×6): 25 mg via ORAL
  Filled 2015-08-22 (×6): qty 1

## 2015-08-22 MED ORDER — ALPRAZOLAM 0.5 MG PO TABS
1.0000 mg | ORAL_TABLET | Freq: Three times a day (TID) | ORAL | Status: DC | PRN
  Administered 2015-08-22 – 2015-08-24 (×3): 1 mg via ORAL
  Filled 2015-08-22 (×3): qty 2

## 2015-08-22 MED ORDER — NICOTINE 14 MG/24HR TD PT24
14.0000 mg | MEDICATED_PATCH | Freq: Every day | TRANSDERMAL | Status: DC
  Administered 2015-08-23 – 2015-08-24 (×2): 14 mg via TRANSDERMAL
  Filled 2015-08-22 (×2): qty 1

## 2015-08-22 MED ORDER — LEVOTHYROXINE SODIUM 25 MCG PO TABS
25.0000 ug | ORAL_TABLET | Freq: Every day | ORAL | Status: DC
  Administered 2015-08-22 – 2015-08-24 (×3): 25 ug via ORAL
  Filled 2015-08-22 (×3): qty 1

## 2015-08-22 MED ORDER — METOPROLOL TARTRATE 12.5 MG HALF TABLET: 12.5000 mg | ORAL_TABLET | Freq: Two times a day (BID) | ORAL | Status: DC

## 2015-08-22 MED ORDER — ISOSORBIDE MONONITRATE ER 30 MG PO TB24
30.0000 mg | ORAL_TABLET | Freq: Every day | ORAL | Status: DC
  Administered 2015-08-22 – 2015-08-24 (×3): 30 mg via ORAL
  Filled 2015-08-22 (×3): qty 1

## 2015-08-22 MED ORDER — SIMVASTATIN 20 MG PO TABS
20.0000 mg | ORAL_TABLET | Freq: Every day | ORAL | Status: DC
  Administered 2015-08-22 – 2015-08-24 (×3): 20 mg via ORAL
  Filled 2015-08-22 (×3): qty 1

## 2015-08-22 MED ORDER — DOXEPIN HCL 10 MG PO CAPS
10.0000 mg | ORAL_CAPSULE | Freq: Every evening | ORAL | Status: DC | PRN
  Filled 2015-08-22: qty 2

## 2015-08-22 MED ORDER — ASPIRIN 325 MG PO TABS: 325.0000 mg | ORAL_TABLET | Freq: Every day | ORAL | Status: DC

## 2015-08-22 MED ORDER — ONDANSETRON HCL 4 MG/2ML IJ SOLN: 4.0000 mg | Freq: Four times a day (QID) | INTRAMUSCULAR | Status: DC | PRN

## 2015-08-22 MED ORDER — ASPIRIN EC 81 MG PO TBEC
81.0000 mg | DELAYED_RELEASE_TABLET | Freq: Every day | ORAL | Status: DC
  Administered 2015-08-22 – 2015-08-24 (×3): 81 mg via ORAL
  Filled 2015-08-22 (×3): qty 1

## 2015-08-22 MED ORDER — NITROGLYCERIN 0.4 MG SL SUBL
0.4000 mg | SUBLINGUAL_TABLET | SUBLINGUAL | Status: DC | PRN
  Filled 2015-08-22: qty 1

## 2015-08-22 MED ORDER — ACETAMINOPHEN 325 MG PO TABS: 650.0000 mg | ORAL_TABLET | ORAL | Status: DC | PRN

## 2015-08-22 MED ORDER — ASPIRIN 81 MG PO CHEW: 324.0000 mg | CHEWABLE_TABLET | ORAL | Status: DC

## 2015-08-22 MED ORDER — ASPIRIN 300 MG RE SUPP: 300.0000 mg | RECTAL | Status: DC

## 2015-08-22 MED ORDER — CETYLPYRIDINIUM CHLORIDE 0.05 % MT LIQD
7.0000 mL | Freq: Two times a day (BID) | OROMUCOSAL | Status: DC
  Administered 2015-08-22 – 2015-08-24 (×4): 7 mL via OROMUCOSAL

## 2015-08-22 NOTE — Progress Notes (Signed)
Pt watched video #115 regarding cath lab and pci

## 2015-08-22 NOTE — Progress Notes (Signed)
ANTICOAGULATION CONSULT NOTE - Follow Up Consult  Pharmacy Consult for Heparin  Indication: chest pain/ACS  No Known Allergies  Patient Measurements: Height: 5\' 4"  (162.6 cm) Weight: 202 lb 9.6 oz (91.9 kg) IBW/kg (Calculated) : 54.7   Vital Signs: Temp: 98.1 F (36.7 C) (01/20 2354) Temp Source: Oral (01/20 2354) BP: 103/58 mmHg (01/20 2354) Pulse Rate: 86 (01/20 2323)  Labs:  Recent Labs  08/21/15 1220 08/21/15 1856 08/22/15 0125  HGB 14.6  --  12.8  HCT 42.0  --  38.0  PLT 253  --  229  LABPROT  --  14.2 15.2  INR  --  1.08 1.18  HEPARINUNFRC  --   --  0.55  CREATININE 0.92  --   --     Estimated Creatinine Clearance: 76.8 mL/min (by C-G formula based on Cr of 0.92).  Assessment: Initial heparin level is therapeutic   Goal of Therapy:  Heparin level 0.3-0.7 units/ml Monitor platelets by anticoagulation protocol: Yes   Plan:  -Continue heparin at 900 units/hr -0800 confirmatory HL   Jocelyn [name] 08/22/2015,2:31 AM

## 2015-08-22 NOTE — Progress Notes (Signed)
SUBJECTIVE: The patient is doing well today.  She did have another episode of chest pain this morning around 8AM which lasted for approximately 4 minutes.  8/10 radiating down her arm, no changes on ECG.  She says the pain was similar to the pain that she has been having.  Marland Kitchen antiseptic oral rinse  7 mL Mouth Rinse BID  . aspirin EC  81 mg Oral Daily  . cholecalciferol  1,000 Units Oral Daily  . isosorbide mononitrate  30 mg Oral Daily  . levothyroxine  25 mcg Oral QAC breakfast  . simvastatin  20 mg Oral QHS  . spironolactone  25 mg Oral BID   . heparin 900 Units/hr (08/22/15 0100)    OBJECTIVE: Physical Exam: Filed Vitals:   08/21/15 2354 08/22/15 0353 08/22/15 0752 08/22/15 0805  BP: 103/58 94/54 98/59  105/66  Pulse:  80 79 75  Temp: 98.1 F (36.7 C) 97.7 F (36.5 C) 97.9 F (36.6 C)   TempSrc: Oral Oral Oral   Resp:  16 14 17   Height: 5\' 4"  (1.626 m)     Weight: 202 lb 9.6 oz (91.9 kg)     SpO2: 98% 97% 98% 100%    Intake/Output Summary (Last 24 hours) at 08/22/15 1023 Last data filed at 08/22/15 0800  Gross per 24 hour  Intake    310 ml  Output    125 ml  Net    185 ml    Telemetry reveals sinus rhythm  GEN- The patient is well appearing, alert and oriented x 3 today.   Head- normocephalic, atraumatic Eyes-  Sclera clear, conjunctiva pink Ears- hearing intact Oropharynx- clear Neck- supple, no JVP Lymph- no cervical lymphadenopathy Lungs- Clear to ausculation bilaterally, normal work of breathing Heart- Regular rate and rhythm, no murmurs, rubs or gallops, PMI not laterally displaced GI- soft, NT, ND, + BS Extremities- no clubbing, cyanosis, or edema Skin- no rash or lesion Psych- euthymic mood, full affect Neuro- strength and sensation are intact  LABS: Basic Metabolic Panel:  Recent Labs  08/21/15 1220 08/21/15 1658 08/22/15 0125  NA 139  --  138  K 4.5  --  3.7  CL 104  --  103  CO2 27  --  23  GLUCOSE 92  --  121*  BUN 10  --  14    CREATININE 0.92  --  1.00  CALCIUM 9.6  --  9.2  MG  --  1.9 1.8  PHOS  --  3.1  --    Liver Function Tests:  Recent Labs  08/22/15 0125  AST 18  ALT 13*  ALKPHOS 48  BILITOT 0.3  PROT 5.9*  ALBUMIN 3.2*   No results for input(s): LIPASE, AMYLASE in the last 72 hours. CBC:  Recent Labs  08/22/15 0125 08/22/15 0550  WBC 8.0 6.4  NEUTROABS 3.1  --   HGB 12.8 13.4  HCT 38.0 39.2  MCV 88.0 87.3  PLT 229 248   Cardiac Enzymes:  Recent Labs  08/22/15 0125 08/22/15 0550  TROPONINI <0.03 <0.03   BNP: Invalid input(s): POCBNP D-Dimer: No results for input(s): DDIMER in the last 72 hours. Hemoglobin A1C: No results for input(s): HGBA1C in the last 72 hours. Fasting Lipid Panel: No results for input(s): CHOL, HDL, LDLCALC, TRIG, CHOLHDL, LDLDIRECT in the last 72 hours. Thyroid Function Tests:  Recent Labs  08/22/15 0125  TSH 10.048*   Anemia Panel: No results for input(s): VITAMINB12, FOLATE, FERRITIN, TIBC, IRON, RETICCTPCT in the  last 72 hours.  RADIOLOGY: Dg Chest 2 View  08/21/2015  CLINICAL DATA:  Acute chest pain. EXAM: CHEST  2 VIEW COMPARISON:  None. FINDINGS: The heart size and mediastinal contours are within normal limits. Both lungs are clear. No pneumothorax or pleural effusion is noted. The visualized skeletal structures are unremarkable. IMPRESSION: No active cardiopulmonary disease. Electronically Signed   By: Marijo Conception, M.D.   On: 08/21/2015 12:56    ASSESSMENT AND PLAN:  Active Problems:   Unstable angina ([place]) 1. Unstable angina: continue IV Heparin gtt. Continue ASA/statin. Episode of chest pain today without ECG changes or other abnormalities.  Plan left heart cath on Monday to redefine coronary anatomy. 2. ASCAD with several MIs in the past and known occlusion of OM1 at site of prior stent 3. HTN - BP continues to be low, hold BB, NTG if more chest pain 4. Dyslipidemia - continue statin 5. History of CHF with normal EF on  last cath 6. Nonsustained ventricular tachycardia - check 2D echo in am to assess LVF.  Plan for cath on Monday.  Tanijah Morais Meredith Leeds, MD 08/22/2015 10:23 AM

## 2015-08-22 NOTE — Progress Notes (Signed)
Notified MD Patient BP 94/54 orders received patient stable with good mentation.

## 2015-08-22 NOTE — Progress Notes (Signed)
Pt c/o chest pain 8/10 , appliet 2l/min of oxygen, bp 105/66,  Went to get nitro sublingual, pt stated pain gone. Did not give nitro, 12 lead ekg tech arrived to room to do AM ekg

## 2015-08-23 DIAGNOSIS — I10 Essential (primary) hypertension: Secondary | ICD-10-CM

## 2015-08-23 DIAGNOSIS — I25110 Atherosclerotic heart disease of native coronary artery with unstable angina pectoris: Principal | ICD-10-CM

## 2015-08-23 LAB — CBC
HCT: 39 % (ref 36.0–46.0)
Hemoglobin: 13.3 g/dL (ref 12.0–15.0)
MCH: 29.9 pg (ref 26.0–34.0)
MCHC: 34.1 g/dL (ref 30.0–36.0)
MCV: 87.6 fL (ref 78.0–100.0)
PLATELETS: 244 10*3/uL (ref 150–400)
RBC: 4.45 MIL/uL (ref 3.87–5.11)
RDW: 13.1 % (ref 11.5–15.5)
WBC: 7.4 10*3/uL (ref 4.0–10.5)

## 2015-08-23 LAB — HEPARIN LEVEL (UNFRACTIONATED)
HEPARIN UNFRACTIONATED: 0.8 [IU]/mL — AB (ref 0.30–0.70)
Heparin Unfractionated: 0.4 IU/mL (ref 0.30–0.70)

## 2015-08-23 MED ORDER — HEPARIN (PORCINE) IN NACL 100-0.45 UNIT/ML-% IJ SOLN
800.0000 [IU]/h | INTRAMUSCULAR | Status: DC
  Filled 2015-08-23: qty 250

## 2015-08-23 NOTE — Progress Notes (Signed)
ANTICOAGULATION CONSULT NOTE - Follow Up Consult  Pharmacy Consult for Heparin  Indication: chest pain/ACS  No Known Allergies  Patient Measurements: Height: 5\' 4"  (162.6 cm) Weight: 202 lb 9.6 oz (91.9 kg) IBW/kg (Calculated) : 54.7   Vital Signs: Temp: 98.3 F (36.8 C) (01/22 0403) Temp Source: Oral (01/22 0403) BP: 108/67 mmHg (01/22 0403) Pulse Rate: 71 (01/22 0403)  Labs:  Recent Labs  08/21/15 1220 08/21/15 1856 08/22/15 0125 08/22/15 0550 08/22/15 0743 08/22/15 1219 08/23/15 0546  HGB 14.6  --  12.8 13.4  --   --  13.3  HCT 42.0  --  38.0 39.2  --   --  39.0  PLT 253  --  229 248  --   --  244  APTT  --   --  101*  --   --   --   --   LABPROT  --  14.2 15.2  --   --   --   --   INR  --  1.08 1.18  --   --   --   --   HEPARINUNFRC  --   --  0.55  --  0.64  --  0.80*  CREATININE 0.92  --  1.00  --   --   --   --   TROPONINI  --   --  <0.03 <0.03  --  <0.03  --     Estimated Creatinine Clearance: 70.7 mL/min (by C-G formula based on Cr of 1).  Assessment: Heparin level is supra-therapeutic this AM, no issues per RN.   Goal of Therapy:  Heparin level 0.3-0.7 units/ml Monitor platelets by anticoagulation protocol: Yes   Plan:  -Decrease heparin to 750 units/hr -1300 HL  Olie Scaffidi 08/23/2015,6:33 AM

## 2015-08-23 NOTE — Progress Notes (Signed)
ANTICOAGULATION CONSULT NOTE - Follow Up Consult  Pharmacy Consult for Heparin  Indication: chest pain/ACS  No Known Allergies  Patient Measurements: Height: 5\' 4"  (162.6 cm) Weight: 202 lb 9.6 oz (91.9 kg) IBW/kg (Calculated) : 54.7   Vital Signs: Temp: 98.4 F (36.9 C) (01/22 1445) Temp Source: Oral (01/22 1445) BP: 100/74 mmHg (01/22 1445) Pulse Rate: 95 (01/22 1445)  Labs:  Recent Labs  08/21/15 1220 08/21/15 1856  08/22/15 0125 08/22/15 0550 08/22/15 0743 08/22/15 1219 08/23/15 0546 08/23/15 1417  HGB 14.6  --   --  12.8 13.4  --   --  13.3  --   HCT 42.0  --   --  38.0 39.2  --   --  39.0  --   PLT 253  --   --  229 248  --   --  244  --   APTT  --   --   --  101*  --   --   --   --   --   LABPROT  --  14.2  --  15.2  --   --   --   --   --   INR  --  1.08  --  1.18  --   --   --   --   --   HEPARINUNFRC  --   --   < > 0.55  --  0.64  --  0.80* 0.40  CREATININE 0.92  --   --  1.00  --   --   --   --   --   TROPONINI  --   --   --  <0.03 <0.03  --  <0.03  --   --   < > = values in this interval not displayed.  Estimated Creatinine Clearance: 70.7 mL/min (by C-G formula based on Cr of 1).  Assessment: 62 yof continues on IV heparin. Heparin level is now therapeutic on 750 units/hr. No bleeding noted.   Goal of Therapy:  Heparin level 0.3-0.7 units/ml Monitor platelets by anticoagulation protocol: Yes   Plan:  - Continue heparin gtt 750 units/hr - Daily heparin level and CBC  Salome Arnt, PharmD, BCPS Pager # [phone] 08/23/2015 3:32 PM

## 2015-08-23 NOTE — Progress Notes (Addendum)
SUBJECTIVE:  Intermittent chest pain.  Currently pain free. She had many questions about cath.  OBJECTIVE:   Vitals:   Filed Vitals:   08/23/15 0403 08/23/15 0715 08/23/15 0741 08/23/15 1126  BP: 108/67  103/69 99/60  Pulse: 71  76 107  Temp: 98.3 F (36.8 C) 98.1 F (36.7 C)  98.2 F (36.8 C)  TempSrc: Oral Oral  Axillary  Resp: 13  16 20   Height:      Weight:      SpO2: 100%  98% 97%   I&O's:   Intake/Output Summary (Last 24 hours) at 08/23/15 1235 Last data filed at 08/23/15 1126  Gross per 24 hour  Intake 1039.88 ml  Output   2800 ml  Net -1760.12 ml   TELEMETRY: Reviewed telemetry pt in NSR, sinus tachycardia:     PHYSICAL EXAM General: Well developed, well nourished, in no acute distress Head:   Normal cephalic and atramatic  Lungs:   Clear bilaterally to auscultation. Heart:   HRRR S1 S2  No JVD.   Abdomen: abdomen soft and non-tender Msk:  Back normal,  Normal strength and tone for age. Extremities:   No edema.  2+ right radial pulse Neuro: Alert and oriented. Psych:  Normal affect, responds appropriately Skin: No rash   LABS: Basic Metabolic Panel:  Recent Labs  08/21/15 1220 08/21/15 1658 08/22/15 0125  NA 139  --  138  K 4.5  --  3.7  CL 104  --  103  CO2 27  --  23  GLUCOSE 92  --  121*  BUN 10  --  14  CREATININE 0.92  --  1.00  CALCIUM 9.6  --  9.2  MG  --  1.9 1.8  PHOS  --  3.1  --    Liver Function Tests:  Recent Labs  08/22/15 0125  AST 18  ALT 13*  ALKPHOS 48  BILITOT 0.3  PROT 5.9*  ALBUMIN 3.2*   No results for input(s): LIPASE, AMYLASE in the last 72 hours. CBC:  Recent Labs  08/22/15 0125 08/22/15 0550 08/23/15 0546  WBC 8.0 6.4 7.4  NEUTROABS 3.1  --   --   HGB 12.8 13.4 13.3  HCT 38.0 39.2 39.0  MCV 88.0 87.3 87.6  PLT 229 248 244   Cardiac Enzymes:  Recent Labs  08/22/15 0125 08/22/15 0550 08/22/15 1219  TROPONINI <0.03 <0.03 <0.03   BNP: Invalid input(s): POCBNP D-Dimer: No results for  input(s): DDIMER in the last 72 hours. Hemoglobin A1C: No results for input(s): HGBA1C in the last 72 hours. Fasting Lipid Panel: No results for input(s): CHOL, HDL, LDLCALC, TRIG, CHOLHDL, LDLDIRECT in the last 72 hours. Thyroid Function Tests:  Recent Labs  08/22/15 0125  TSH 10.048*   Anemia Panel: No results for input(s): VITAMINB12, FOLATE, FERRITIN, TIBC, IRON, RETICCTPCT in the last 72 hours. Coag Panel:   Lab Results  Component Value Date   INR 1.18 08/22/2015   INR 1.08 08/21/2015    RADIOLOGY: Dg Chest 2 View  08/21/2015  CLINICAL DATA:  Acute chest pain. EXAM: CHEST  2 VIEW COMPARISON:  None. FINDINGS: The heart size and mediastinal contours are within normal limits. Both lungs are clear. No pneumothorax or pleural effusion is noted. The visualized skeletal structures are unremarkable. IMPRESSION: No active cardiopulmonary disease. Electronically Signed   By: Marijo Conception, M.D.   On: 08/21/2015 12:56      ASSESSMENT: Unstable angina  PLAN:  1) Plan for  cath in the AM.  Recurrent pain but negative enzymes.  Known occlusion of the OM stent from cath done in [place].  Continue IV heparin at this time. Risks and benefits of cath explained to the patient and all questions answered.   2) HTN: BP stable  3) Hyperlipidemia: Continue statin.  Normal LV function in 7/16.  Jettie Booze, MD  08/23/2015  12:35 PM

## 2015-08-24 ENCOUNTER — Encounter (HOSPITAL_COMMUNITY): Payer: Self-pay | Admitting: Physician Assistant

## 2015-08-24 ENCOUNTER — Encounter (HOSPITAL_COMMUNITY)
Admission: EM | Disposition: A | Discharge status: Home / Self Care | Payer: Self-pay | Source: Home / Self Care | Attending: Cardiology

## 2015-08-24 DIAGNOSIS — E7800 Pure hypercholesterolemia, unspecified: Secondary | ICD-10-CM

## 2015-08-24 DIAGNOSIS — E039 Hypothyroidism, unspecified: Secondary | ICD-10-CM

## 2015-08-24 HISTORY — PX: CARDIAC CATHETERIZATION: SHX172

## 2015-08-24 LAB — CBC
HEMATOCRIT: 37.1 % (ref 36.0–46.0)
Hemoglobin: 12.8 g/dL (ref 12.0–15.0)
MCH: 30 pg (ref 26.0–34.0)
MCHC: 34.5 g/dL (ref 30.0–36.0)
MCV: 87.1 fL (ref 78.0–100.0)
Platelets: 250 10*3/uL (ref 150–400)
RBC: 4.26 MIL/uL (ref 3.87–5.11)
RDW: 13.2 % (ref 11.5–15.5)
WBC: 6.9 10*3/uL (ref 4.0–10.5)

## 2015-08-24 LAB — HEPARIN LEVEL (UNFRACTIONATED): Heparin Unfractionated: 0.26 IU/mL — ABNORMAL LOW (ref 0.30–0.70)

## 2015-08-24 LAB — T4, FREE: Free T4: 0.74 ng/dL (ref 0.61–1.12)

## 2015-08-24 LAB — HEMOGLOBIN A1C
Hgb A1c MFr Bld: 6.3 % — ABNORMAL HIGH (ref 4.8–5.6)
Mean Plasma Glucose: 134 mg/dL

## 2015-08-24 SURGERY — LEFT HEART CATH AND CORONARY ANGIOGRAPHY
Anesthesia: LOCAL

## 2015-08-24 MED ORDER — FENTANYL CITRATE (PF) 100 MCG/2ML IJ SOLN
INTRAMUSCULAR | Status: AC
  Filled 2015-08-24: qty 2

## 2015-08-24 MED ORDER — SODIUM CHLORIDE 0.9 % WEIGHT BASED INFUSION
3.0000 mL/kg/h | INTRAVENOUS | Status: AC
  Administered 2015-08-24: 3 mL/kg/h via INTRAVENOUS

## 2015-08-24 MED ORDER — MORPHINE SULFATE (PF) 2 MG/ML IV SOLN
2.0000 mg | Freq: Once | INTRAVENOUS | Status: AC
  Administered 2015-08-24: 2 mg via INTRAVENOUS

## 2015-08-24 MED ORDER — HEPARIN SODIUM (PORCINE) 1000 UNIT/ML IJ SOLN
INTRAMUSCULAR | Status: AC
  Filled 2015-08-24: qty 1

## 2015-08-24 MED ORDER — VERAPAMIL HCL 2.5 MG/ML IV SOLN
INTRAVENOUS | Status: AC
  Filled 2015-08-24: qty 2

## 2015-08-24 MED ORDER — ASPIRIN 81 MG PO TBEC: 81.0000 mg | DELAYED_RELEASE_TABLET | Freq: Every day | ORAL | Status: DC

## 2015-08-24 MED ORDER — MIDAZOLAM HCL 2 MG/2ML IJ SOLN
INTRAMUSCULAR | Status: AC
  Filled 2015-08-24: qty 2

## 2015-08-24 MED ORDER — LIDOCAINE HCL (PF) 1 % IJ SOLN
INTRAMUSCULAR | Status: DC | PRN
  Administered 2015-08-24: 2 mL via INTRADERMAL

## 2015-08-24 MED ORDER — HEPARIN (PORCINE) IN NACL 2-0.9 UNIT/ML-% IJ SOLN
INTRAMUSCULAR | Status: DC | PRN
  Administered 2015-08-24: 1000 mL via INTRA_ARTERIAL

## 2015-08-24 MED ORDER — ASPIRIN 81 MG PO CHEW
81.0000 mg | CHEWABLE_TABLET | ORAL | Status: AC
  Administered 2015-08-24: 81 mg via ORAL
  Filled 2015-08-24: qty 1

## 2015-08-24 MED ORDER — SODIUM CHLORIDE 0.9 % IJ SOLN
3.0000 mL | Freq: Two times a day (BID) | INTRAMUSCULAR | Status: DC
  Administered 2015-08-24: 3 mL via INTRAVENOUS

## 2015-08-24 MED ORDER — SODIUM CHLORIDE 0.9 % WEIGHT BASED INFUSION: 1.0000 mL/kg/h | INTRAVENOUS | Status: DC

## 2015-08-24 MED ORDER — NITROGLYCERIN 0.4 MG SL SUBL: 0.4000 mg | SUBLINGUAL_TABLET | SUBLINGUAL | Status: DC | PRN

## 2015-08-24 MED ORDER — VERAPAMIL HCL 2.5 MG/ML IV SOLN
INTRAVENOUS | Status: DC | PRN
  Administered 2015-08-24: 17:00:00 via INTRA_ARTERIAL

## 2015-08-24 MED ORDER — HEPARIN SODIUM (PORCINE) 1000 UNIT/ML IJ SOLN
INTRAMUSCULAR | Status: DC | PRN
  Administered 2015-08-24: 5000 [IU] via INTRAVENOUS

## 2015-08-24 MED ORDER — SODIUM CHLORIDE 0.9 % IJ SOLN: 3.0000 mL | INTRAMUSCULAR | Status: DC | PRN

## 2015-08-24 MED ORDER — LIDOCAINE HCL (PF) 1 % IJ SOLN
INTRAMUSCULAR | Status: AC
  Filled 2015-08-24: qty 30

## 2015-08-24 MED ORDER — SODIUM CHLORIDE 0.9 % WEIGHT BASED INFUSION
1.0000 mL/kg/h | INTRAVENOUS | Status: DC
  Administered 2015-08-24: 1 mL/kg/h via INTRAVENOUS

## 2015-08-24 MED ORDER — MIDAZOLAM HCL 2 MG/2ML IJ SOLN
INTRAMUSCULAR | Status: DC | PRN
  Administered 2015-08-24: 1 mg via INTRAVENOUS
  Administered 2015-08-24: 2 mg via INTRAVENOUS

## 2015-08-24 MED ORDER — HEPARIN (PORCINE) IN NACL 2-0.9 UNIT/ML-% IJ SOLN
INTRAMUSCULAR | Status: AC
  Filled 2015-08-24: qty 1000

## 2015-08-24 MED ORDER — SODIUM CHLORIDE 0.9 % IV SOLN: 250.0000 mL | INTRAVENOUS | Status: DC | PRN

## 2015-08-24 MED ORDER — MORPHINE SULFATE (PF) 2 MG/ML IV SOLN
INTRAVENOUS | Status: AC
  Filled 2015-08-24: qty 1

## 2015-08-24 MED ORDER — FENTANYL CITRATE (PF) 100 MCG/2ML IJ SOLN
INTRAMUSCULAR | Status: DC | PRN
  Administered 2015-08-24 (×2): 25 ug via INTRAVENOUS

## 2015-08-24 MED ORDER — IOHEXOL 350 MG/ML SOLN
INTRAVENOUS | Status: DC | PRN
  Administered 2015-08-24: 55 mL via INTRA_ARTERIAL

## 2015-08-24 SURGICAL SUPPLY — 11 items

## 2015-08-24 NOTE — Progress Notes (Signed)
Patient Name: Jocelyn [name] Date of Encounter: 08/24/2015  Principal Problem:   Unstable angina ([place]) Active Problems:   HTN (hypertension)   Hypercholesterolemia   Tobacco abuse   Hypothyroidism (acquired)   Primary Cardiologist: Dr Martinique Patient Profile: 55 yo female w/ hx MI 1991>>stent OM, occluded at cath in [place] 09/2014, HTN, HL, TIA, D-CHF, hypothyroid (hx thyrotoxicosis), admitted 01/20 w/ chest pain, cath 01/23 pm  SUBJECTIVE: Feels palpitations at times, no angina; primary MD checked lipids a few weeks ago.  OBJECTIVE Filed Vitals:   08/24/15 0357 08/24/15 0400 08/24/15 0600 08/24/15 0736  BP:  100/53  112/71  Pulse:  87  83  Temp: 97.8 F (36.6 C)   97.9 F (36.6 C)  TempSrc: Oral     Resp:  16  15  Height:      Weight:   205 lb 11 oz (93.3 kg)   SpO2:  100%  99%    Intake/Output Summary (Last 24 hours) at 08/24/15 0917 Last data filed at 08/24/15 0000  Gross per 24 hour  Intake  912.5 ml  Output   2500 ml  Net -1587.5 ml   Filed Weights   08/21/15 1231 08/21/15 2354 08/24/15 0600  Weight: 201 lb (91.173 kg) 202 lb 9.6 oz (91.9 kg) 205 lb 11 oz (93.3 kg)    PHYSICAL EXAM General: Well developed, well nourished, female in no acute distress. Head: Normocephalic, atraumatic.  Neck: Supple without bruits, JVD not elevated. Lungs:  Resp regular and unlabored, CTA. Heart: RRR, S1, S2, no S3, S4, soft murmur; no rub. Abdomen: Soft, non-tender, non-distended, BS + x 4.  Extremities: No clubbing, cyanosis, edema.  Neuro: Alert and oriented X 3. Moves all extremities spontaneously. Psych: Normal affect.  LABS: CBC: Recent Labs  08/22/15 0125  08/23/15 0546 08/24/15 0422  WBC 8.0  < > 7.4 6.9  NEUTROABS 3.1  --   --   --   HGB 12.8  < > 13.3 12.8  HCT 38.0  < > 39.0 37.1  MCV 88.0  < > 87.6 87.1  PLT 229  < > 244 250  < > = values in this interval not displayed. INR:  Recent Labs  08/22/15 0125  INR [id number]   Basic Metabolic  Panel:  Recent Labs  08/21/15 1220 08/21/15 1658 08/22/15 0125  NA 139  --  138  K 4.5  --  3.7  CL 104  --  103  CO2 27  --  23  GLUCOSE 92  --  121*  BUN 10  --  14  CREATININE 0.92  --  1.00  CALCIUM 9.6  --  9.2  MG  --  1.9 1.8  PHOS  --  3.1  --    Liver Function Tests:  Recent Labs  08/22/15 0125  AST 18  ALT 13*  ALKPHOS 48  BILITOT 0.3  PROT 5.9*  ALBUMIN 3.2*   Cardiac Enzymes:  Recent Labs  08/22/15 0125 08/22/15 0550 08/22/15 1219  TROPONINI <0.03 <0.03 <0.03    Recent Labs  08/21/15 1247 08/21/15 1558  TROPIPOC 0.01 0.00   Thyroid Function Tests:  Recent Labs  08/22/15 0125  TSH 10.048*   TELE:  SR, ST, PVCs and rare pairs   Current Medications:  . antiseptic oral rinse  7 mL Mouth Rinse BID  . aspirin EC  81 mg Oral Daily  . cholecalciferol  1,000 Units Oral Daily  . isosorbide mononitrate  30 mg Oral  Daily  . levothyroxine  25 mcg Oral QAC breakfast  . nicotine  14 mg Transdermal Daily  . simvastatin  20 mg Oral QHS  . sodium chloride  3 mL Intravenous Q12H  . spironolactone  25 mg Oral BID   . sodium chloride 1 mL/kg/hr (08/24/15 0609)  . heparin 750 Units/hr (08/24/15 0000)    ASSESSMENT AND PLAN: Principal Problem:   Unstable angina ([facility]) - ez neg MI, for cath today - known occluded OM (previous stent site)  Active Problems:   HTN (hypertension) - good control on current rx    Hypercholesterolemia - followed by Dr Vista Lawman - recent profile - she will get records for [place]    Tobacco abuse - on a patch - reinforced need to quit    Hypothyroidism (acquired) - on home rx - TSH elevated - ck free T4  Signed, Barrett, Rhonda , PA-C 9:17 AM 08/24/2015 As above; patient seen and examined; mild chest pain this AM; enzymes negative; plan for cath today; risks and benefits discussed and pt agrees to proceed. Given h/o CAD, dc zocor and treat with lipitor 80 mg daily. Will need fu with primary care for elevated  TSH. Kirk Ruths

## 2015-08-24 NOTE — Discharge Instructions (Signed)
Radial Site Care °Refer to this sheet in the next few weeks. These instructions provide you with information about caring for yourself after your procedure. Your health care provider may also give you more specific instructions. Your treatment has been planned according to current medical practices, but problems sometimes occur. Call your health care provider if you have any problems or questions after your procedure. °WHAT TO EXPECT AFTER THE PROCEDURE °After your procedure, it is typical to have the following: °· Bruising at the radial site that usually fades within 1-2 weeks. °· Blood collecting in the tissue (hematoma) that may be painful to the touch. It should usually decrease in size and tenderness within 1-2 weeks. °HOME CARE INSTRUCTIONS °· Take medicines only as directed by your health care provider. °· You may shower 24-48 hours after the procedure or as directed by your health care provider. Remove the bandage (dressing) and gently wash the site with plain soap and water. Pat the area dry with a clean towel. Do not rub the site, because this may cause bleeding. °· Do not take baths, swim, or use a hot tub until your health care provider approves. °· Check your insertion site every day for redness, swelling, or drainage. °· Do not apply powder or lotion to the site. °· Do not flex or bend the affected arm for 24 hours or as directed by your health care provider. °· Do not push or pull heavy objects with the affected arm for 24 hours or as directed by your health care provider. °· Do not lift over 10 lb (4.5 kg) for 5 days after your procedure or as directed by your health care provider. °· Ask your health care provider when it is okay to: °¨ Return to work or school. °¨ Resume usual physical activities or sports. °¨ Resume sexual activity. °· Do not drive home if you are discharged the same day as the procedure. Have someone else drive you. °· You may drive 24 hours after the procedure unless otherwise  instructed by your health care provider. °· Do not operate machinery or power tools for 24 hours after the procedure. °· If your procedure was done as an outpatient procedure, which means that you went home the same day as your procedure, a responsible adult should be with you for the first 24 hours after you arrive home. °· Keep all follow-up visits as directed by your health care provider. This is important. °SEEK MEDICAL CARE IF: °· You have a fever. °· You have chills. °· You have increased bleeding from the radial site. Hold pressure on the site. °SEEK IMMEDIATE MEDICAL CARE IF: °· You have unusual pain at the radial site. °· You have redness, warmth, or swelling at the radial site. °· You have drainage (other than a small amount of blood on the dressing) from the radial site. °· The radial site is bleeding, and the bleeding does not stop after 30 minutes of holding steady pressure on the site. °· Your arm or hand becomes pale, cool, tingly, or numb. °  °This information is not intended to replace advice given to you by your health care provider. Make sure you discuss any questions you have with your health care provider. °  °Document Released: 08/20/2010 Document Revised: 08/08/2014 Document Reviewed: 02/03/2014 °[employer]vier Interactive Patient Education ©2016 [employer] ° °

## 2015-08-24 NOTE — Progress Notes (Signed)
    Cath unchanged. Patient stable and wants to go home.  Discharge orders done.  Full discharge summary to follow.  ASA dose decresed to 81 mg daily.  SL NTG prescribed.    F/u with Dr. Martinique to discuss possible CTO PCI.  Jettie Booze, MD

## 2015-08-24 NOTE — Care Management Note (Signed)
Case Management Note  Patient Details  Name: Jocelyn [name] MRN: [id number] Date of Birth: [birth year]  Subjective/Objective:      Date: 08/24/15 Spoke with patient at the bedside .  Introduced self as Tourist information centre manager and explained role in discharge planning and how to be reached.  Verified patient lives in town, alone. Expressed potential need for cpap machine, the one she had before does not work and she no longer has it, patient is also interested in a back brace and a knee brace.  NCM informed patient that she needs to speak with her MD about this because MD will have to order this if needed and she would need a sleep study. Verified patient anticipates to go home alone at time of discharge. Patient denied needing help with their medication.  Patient is driven by daughter to MD appointments.  Verified patient has PCP [name]. Patient was asking about the [facility] services with Medicaid,  NCM gave patient the paperwork to give to her PCP to fill out and send to [facility].     Plan: CM will continue to follow for discharge planning and [facility] resources.               Action/Plan:   Expected Discharge Date:  08/24/15               Expected Discharge Plan:  [place]  In-House Referral:     Discharge planning Services  CM Consult  Post Acute Care Choice:    Choice offered to:     DME Arranged:    DME Agency:     HH Arranged:    [place] Agency:     Status of Service:  In process, will continue to follow  Medicare Important Message Given:    Date Medicare IM Given:    Medicare IM give by:    Date Additional Medicare IM Given:    Additional Medicare Important Message give by:     If discussed at [place] of Stay Meetings, dates discussed:    Additional Comments:  Zenon Mayo, RN 08/24/2015, 3:16 PM

## 2015-08-24 NOTE — Progress Notes (Signed)
ANTICOAGULATION CONSULT NOTE - Follow Up Consult  Pharmacy Consult for Heparin  Indication: chest pain/ACS  No Known Allergies  Patient Measurements: Height: 5\' 4"  (162.6 cm) Weight: 205 lb 11 oz (93.3 kg) IBW/kg (Calculated) : 54.7   Vital Signs: Temp: Jocelyn.9 F (36.6 C) (01/23 0736) Temp Source: Oral (01/23 0357) BP: 112/71 mmHg (01/23 0736) Pulse Rate: 83 (01/23 0736)  Labs:  Recent Labs  08/21/15 1220 08/21/15 1856 08/22/15 0125 08/22/15 0550  08/22/15 1219 08/23/15 0546 08/23/15 1417 08/24/15 0422  HGB 14.6  --  12.8 13.4  --   --  13.3  --  12.8  HCT 42.0  --  38.0 39.2  --   --  39.0  --  37.1  PLT 253  --  229 248  --   --  244  --  250  APTT  --   --  101*  --   --   --   --   --   --   LABPROT  --  14.2 15.2  --   --   --   --   --   --   INR  --  1.08 1.18  --   --   --   --   --   --   HEPARINUNFRC  --   --  0.55  --   < >  --  0.80* 0.40 0.26*  CREATININE 0.92  --  1.00  --   --   --   --   --   --   TROPONINI  --   --  <0.03 <0.03  --  <0.03  --   --   --   < > = values in this interval not displayed.  Estimated Creatinine Clearance: 71.2 mL/min (by C-G formula based on Cr of 1).  Assessment: Jocelyn [name] continues on IV heparin. Heparin level is now therapeutic on 750 units/hr. No bleeding noted. Plan for cath at noon.  Goal of Therapy:  Heparin level 0.3-0.7 units/ml Monitor platelets by anticoagulation protocol: Yes   Plan:  - Increase heparin gtt slightly to 800 units/hr - f/u 6 hr heparin level at 1700 or f/u after cath. - Daily heparin level and CBC  Maryanna Shape, PharmD, BCPS  Clinical Pharmacist  Pager: [phone]   08/24/2015 10:22 AM

## 2015-08-24 NOTE — Discharge Summary (Signed)
Discharge Summary    Patient ID: Jocelyn [name],  MRN: [id number], DOB/AGE: [birth year] 55 y.o.  Admit date: 08/21/2015 Discharge date: 08/24/2015  Primary Care Provider: OSEI-BONSU,GEORGE Primary Cardiologist: Dr. 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 The lesion was previously treated with a stent (unknown type).  The left ventricular systolic function is normal.  No other obstructive disease.  Continue medical therapy. Noncardiac causes of her chest pain should be considered. Cardiology f/u with Dr. Martinique.     History of Present Illness     55 yo female w/ hx MI 1991>>stent OM, occluded at cath in [place] 09/2014, HTN, HL, TIA, D-CHF, hypothyroid (hx thyrotoxicosis), admitted 01/20 w/ chest pain,   She has had several caths since MI  but does not remember details. Myoview in the past showed lateral scar with some ischemia in the inferior wall and normal LVF. Cath showed occluded OM at site of prior stent with left to left collaterals and she has been on medical therapy.   The patient presented 08/21/15  with complaints of chest pain that has been occurring for the past month intermittently. She has a hard time describing the quality of the pain but it is left sided and radiates across into her left arm, jaw and between her shoulder blades. It can occur with rest or with exertion. She saw Dr. Martinique back in the summer with what sounds to be similar pain associated with dizziness. She had pain this am and took SL NTG with really no relief and now presents to the ER. While in the ER she has had several runs of nonsustained ventricular tachycardia.  Cardiology is now asked to admit for further evaluation. Initial troponin is neg. Start IV Heparin gtt. Hold BB and NTG due to soft BP. Continue ASA/statin.  Echo 02/09/15 showd normal LV function.   Hospital Course     Consultants: None  She continued to have a chest pain radiating down to her arm without change in EKD. She was treated medically over weekend with asa/statin and IV heparin. Top x 3 was negative. Cath showed 100% in stent restenosis in 1st Mrg lesion, The lesion was previously treated with a stent (unknown type) with left-to-left collateral flow. Unchanged from prior cath.  Patient stable and wanted to go home. No further chest pain.   A1c of 6.3. TSH was elevated with normal Free T4. Continued home medication dose.  Hypercholesterolemia followed by Dr Vista Lawman. Given h/o CAD, dc zocor and treat with lipitor 80 mg daily.   The patient has been seen by Dr. Irish Lack today and deemed ready for discharge home. All follow-up appointments have been scheduled. Discharge medications are listed below.   Will need f/u with primary care for elevated TSH. F/u with Dr. Martinique to discuss possible CTO PCI.  Discharge Vitals Blood pressure 154/80, pulse 89, temperature 97.9 F (36.6 C), temperature source Oral, resp. rate 22, height 5\' 4"  (1.626 m), weight 205 lb 11 oz (93.3 kg), SpO2 97 %.  Filed Weights   08/21/15 1231 08/21/15 2354 08/24/15 0600  Weight: 201 lb (91.173 kg) 202 lb 9.6 oz (91.9 kg) 205 lb 11 oz (93.3  kg)    Labs & Radiologic Studies     CBC  Recent Labs  08/22/15 0125  08/23/15 0546 08/24/15 0422  WBC 8.0  < > 7.4 6.9  NEUTROABS 3.1  --   --   --   HGB 12.8  < > 13.3 12.8  HCT 38.0  < > 39.0 37.1  MCV 88.0  < > 87.6 87.1  PLT 229  < > 244 250  < > = values in this interval not displayed. Basic Metabolic Panel  Recent Labs  08/22/15 0125  NA 138  K 3.7  CL 103  CO2 23  GLUCOSE 121*  BUN 14  CREATININE 1.00  CALCIUM 9.2  MG 1.8   Liver  Function Tests  Recent Labs  08/22/15 0125  AST 18  ALT 13*  ALKPHOS 48  BILITOT 0.3  PROT 5.9*  ALBUMIN 3.2*   No results for input(s): LIPASE, AMYLASE in the last 72 hours. Cardiac Enzymes  Recent Labs  08/22/15 0125 08/22/15 0550 08/22/15 1219  TROPONINI <0.03 <0.03 <0.03   Hemoglobin A1C  Recent Labs  08/22/15 0125  HGBA1C 6.3*   Thyroid Function Tests  Recent Labs  08/22/15 0125  TSH 10.048*    Dg Chest 2 View  08/21/2015  CLINICAL DATA:  Acute chest pain. EXAM: CHEST  2 VIEW COMPARISON:  None. FINDINGS: The heart size and mediastinal contours are within normal limits. Both lungs are clear. No pneumothorax or pleural effusion is noted. The visualized skeletal structures are unremarkable. IMPRESSION: No active cardiopulmonary disease. Electronically Signed   By: Marijo Conception, M.D.   On: 08/21/2015 12:56    Disposition   Pt is being discharged home today in good condition.  Follow-up Plans & Appointments    Follow-up Information    Follow up with Peter Martinique, MD. Call in 3 weeks.   Specialty:  Cardiology   Contact information:   [address] [place] [address] [phone]       Follow up with OSEI-BONSU,GEORGE, MD. Call in 1 week.   Specialty:  Internal Medicine   Why:  for TSH managment   Contact information:   [id number] [place] [address] [phone]        Discharge Medications   Current Discharge Medication List    START taking these medications   Details  aspirin EC 81 MG EC tablet Take 1 tablet (81 mg total) by mouth daily.    nitroGLYCERIN (NITROSTAT) 0.4 MG SL tablet Place 1 tablet (0.4 mg total) under the tongue every 5 (five) minutes x 3 doses as needed for chest pain. Qty: 25 tablet, Refills: 12      CONTINUE these medications which have NOT CHANGED   Details  albuterol (PROVENTIL HFA;VENTOLIN HFA) 108 (90 Base) MCG/ACT inhaler Inhale 2 puffs into the lungs every 4 (four) hours  as needed.    ALPRAZolam (XANAX) 0.5 MG tablet Take 1 mg by mouth 3 (three) times daily as needed for anxiety or sleep.  Refills: 1    Cholecalciferol (VITAMIN D-3) 1000 units CAPS Take 1 capsule by mouth daily.    cyclobenzaprine (FLEXERIL) 10 MG tablet Take 10 mg by mouth daily.  Refills: 0    doxepin (SINEQUAN) 10 MG capsule Take 10-20 mg by mouth at bedtime as needed (for sleep).  Refills: 0    estradiol (ESTRACE) 0.5 MG tablet Take 0.5 mg by mouth daily. Refills: 1    FLUoxetine (PROZAC) 20 MG  capsule Take 20 mg by mouth daily as needed.  Refills: 2    furosemide (LASIX) 40 MG tablet Take 40 mg by mouth daily as needed for fluid.    isosorbide mononitrate (IMDUR) 30 MG 24 hr tablet Take 1 tablet (30 mg total) by mouth daily. Qty: 30 tablet, Refills: 11   Associated Diagnoses: Coronary artery disease involving native coronary artery with other forms of angina pectoris (HCC)    KLOR-CON M10 10 MEQ tablet Take 10 mEq by mouth 2 (two) times daily. Refills: 0    levothyroxine (SYNTHROID, LEVOTHROID) 25 MCG tablet Take 25 mcg by mouth daily. Refills: 1    metoprolol tartrate (LOPRESSOR) 25 MG tablet Take 12.5 mg by mouth 2 (two) times daily. Refills: 2    simvastatin (ZOCOR) 20 MG tablet Take 20 mg by mouth at bedtime. Refills: 1    spironolactone (ALDACTONE) 25 MG tablet Take 25 mg by mouth 2 (two) times daily. Refills: 1    triamcinolone cream (KENALOG) 0.5 % Apply 1 application topically daily as needed (for sun exposure).      STOP taking these medications     aspirin 325 MG tablet             Outstanding Labs/Studies   None  Duration of Discharge Encounter   Greater than 30 minutes including physician time.  Signed, Bhagat,Bhavinkumar PA-C 08/24/2015, 10:04 PM    I have examined the patient and reviewed assessment and plan and discussed with patient.  Agree with above as stated.    Cath unchanged. Patient stable and wants to go home. Discharge  orders done. Full discharge summary to follow. ASA dose decresed to 81 mg daily. SL NTG prescribed. Continue aggressive secondary prevention.  F/u with Dr. Martinique to discuss possible CTO PCI.  Harrold Fitchett S.

## 2015-08-24 NOTE — Progress Notes (Signed)
TR BAND REMOVAL  LOCATION:    right radial  DEFLATED PER PROTOCOL:    Yes.    TIME BAND OFF / DRESSING APPLIED:    1940p   SITE UPON ARRIVAL:    Level 0  SITE AFTER BAND REMOVAL:    Level 0  CIRCULATION SENSATION AND MOVEMENT:    Within Normal Limits   Yes.    COMMENTS:   Pt medicated for Migraine HA with Morphine 2mg  IV .  Pain med effective

## 2015-08-25 ENCOUNTER — Encounter (HOSPITAL_COMMUNITY): Payer: Self-pay | Admitting: Interventional Cardiology

## 2015-10-14 ENCOUNTER — Telehealth: Payer: Self-pay | Admitting: Cardiology

## 2015-10-14 NOTE — Telephone Encounter (Signed)
Returned call to patient.She stated she would like to change care to Dr.Varanasi.Stated he did her cath 08/28/15 and she has not been seen since then.Advised I will let Dr.Jordan know.Scheduler will call back to schedule appointment with Dr.Varanasi.

## 2015-10-14 NOTE — Telephone Encounter (Signed)
Pt would like to see Dr Irish Lack please.  Pt stated he did her Cath and would prefer him.   Pt has called to make and appointment with him told her of the process.  Please give her a call back.

## 2015-10-30 ENCOUNTER — Other Ambulatory Visit: Payer: Self-pay | Admitting: Physician Assistant

## 2015-10-30 DIAGNOSIS — N644 Mastodynia: Secondary | ICD-10-CM

## 2015-11-06 ENCOUNTER — Ambulatory Visit
Admission: RE | Admit: 2015-11-06 | Discharge: 2015-11-06 | Disposition: A | Discharge status: Home / Self Care | Payer: Medicaid Other | Source: Ambulatory Visit | Attending: Physician Assistant | Admitting: Physician Assistant

## 2015-11-06 DIAGNOSIS — N644 Mastodynia: Secondary | ICD-10-CM

## 2015-11-12 ENCOUNTER — Other Ambulatory Visit: Payer: Self-pay | Admitting: Internal Medicine

## 2015-11-12 DIAGNOSIS — I714 Abdominal aortic aneurysm, without rupture, unspecified: Secondary | ICD-10-CM

## 2015-11-19 ENCOUNTER — Ambulatory Visit
Admission: RE | Admit: 2015-11-19 | Discharge: 2015-11-19 | Disposition: A | Discharge status: Home / Self Care | Payer: Medicaid Other | Source: Ambulatory Visit | Attending: Internal Medicine | Admitting: Internal Medicine

## 2015-11-19 DIAGNOSIS — I714 Abdominal aortic aneurysm, without rupture, unspecified: Secondary | ICD-10-CM

## 2015-11-19 MED ORDER — IOPAMIDOL (ISOVUE-300) INJECTION 61%
125.0000 mL | Freq: Once | INTRAVENOUS | Status: AC | PRN
  Administered 2015-11-19: 125 mL via INTRAVENOUS

## 2015-11-20 ENCOUNTER — Telehealth: Payer: Self-pay | Admitting: Interventional Cardiology

## 2015-11-20 NOTE — Telephone Encounter (Signed)
New message      Called pt to confirm her appt for 11-24-15.  She stated that she had a CT of her abdomin on yesterday at [facility].  It was ordered by Dr Manson Passey Bonsu.  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 She want Dr Irish Lack to know in case he want to look at the results in the computer.

## 2015-11-20 NOTE — Telephone Encounter (Signed)
The pt is advised that I will forward this message to Dr Irish Lack as Juluis Rainier. She verbalized understanding.

## 2015-11-24 ENCOUNTER — Encounter: Payer: Self-pay | Admitting: Interventional Cardiology

## 2015-11-24 ENCOUNTER — Ambulatory Visit (INDEPENDENT_AMBULATORY_CARE_PROVIDER_SITE_OTHER): Payer: Medicaid Other | Admitting: Interventional Cardiology

## 2015-11-24 VITALS — BP 100/60 | HR 102 | Ht 64.0 in | Wt 215.0 lb

## 2015-11-24 DIAGNOSIS — I700 Atherosclerosis of aorta: Secondary | ICD-10-CM

## 2015-11-24 DIAGNOSIS — I10 Essential (primary) hypertension: Secondary | ICD-10-CM

## 2015-11-24 DIAGNOSIS — E7800 Pure hypercholesterolemia, unspecified: Secondary | ICD-10-CM | POA: Diagnosis not present

## 2015-11-24 DIAGNOSIS — I25118 Atherosclerotic heart disease of native coronary artery with other forms of angina pectoris: Secondary | ICD-10-CM

## 2015-11-24 NOTE — Patient Instructions (Signed)
Medication Instructions:  Same-no change  Labwork: None  Testing/Procedures: None      If you need a refill on your cardiac medications before your next appointment, please call your pharmacy.

## 2015-11-24 NOTE — Progress Notes (Signed)
Patient ID: Jocelyn [name], female   DOB: 1960/08/13, 55 y.o.   MRN: [id number]     Cardiology Office Note   Date:  11/24/2015   ID:  Jocelyn [name], DOB Mar 14, 1961, MRN [id number]  PCP:  Benito Mccreedy, MD    No chief complaint on file. Follow-up CAD   Wt Readings from Last 3 Encounters:  11/24/15 215 lb (97.523 kg)  08/24/15 205 lb 11 oz (93.3 kg)  03/18/15 187 lb (84.823 kg)       History of Present Illness: Jocelyn [name] is a 55 y.o. female  who has had coronary artery disease. She had a chronically occluded obtuse marginal noted on catheterization from January. She has not had much in the LAD angina since that time. She continues on her medications and seems to be doing well. Her biggest complaints are weight gain, and ankle swelling. She had normal left ventricular function in 2016.  She does not exercise regularly. She eats restaurant food frequently. She eats canned food frequently.    Past Medical History  Diagnosis Date  . Myocardial infarction ([place])   . Thyrotoxicosis   . TIA (transient ischemic attack)   . HTN (hypertension)   . Hypercholesterolemia   . Chronic diastolic CHF (congestive heart failure), NYHA class 2 ([place])   . Hypothyroidism (acquired)     Past Surgical History  Procedure Laterality Date  . Partial hysterec    . Knee surgery    . Thyroidectomy    . Breast cyst excision    . Coronary stent placement  1999  . Cardiac catheterization N/A 08/24/2015    Procedure: Left Heart Cath and Coronary Angiography;  Surgeon: Jettie Booze, MD;  Location: [facility];  Service: Cardiovascular;  Laterality: N/A;     Current Outpatient Prescriptions  Medication Sig Dispense Refill  . albuterol (PROVENTIL HFA;VENTOLIN HFA) 108 (90 Base) MCG/ACT inhaler Inhale 2 puffs into the lungs every 4 (four) hours as needed for shortness of breath.     . ALPRAZolam (XANAX) 0.5 MG tablet Take 1 mg by mouth 3 (three) times daily as needed for anxiety or  sleep.   1  . aspirin EC 81 MG EC tablet Take 1 tablet (81 mg total) by mouth daily.    . Cholecalciferol (VITAMIN D-3) 1000 units CAPS Take 1 capsule by mouth daily.    . cyclobenzaprine (FLEXERIL) 10 MG tablet Take 10 mg by mouth daily.   0  . doxepin (SINEQUAN) 10 MG capsule Take 10-20 mg by mouth at bedtime as needed (for sleep).   0  . estradiol (ESTRACE) 0.5 MG tablet Take 0.5 mg by mouth daily.  1  . FLUoxetine (PROZAC) 20 MG capsule Take 20 mg by mouth daily as needed (ANXIETY).   2  . furosemide (LASIX) 40 MG tablet Take 40 mg by mouth daily as needed for fluid.    . isosorbide mononitrate (IMDUR) 30 MG 24 hr tablet Take 1 tablet (30 mg total) by mouth daily. 30 tablet 11  . KLOR-CON M10 10 MEQ tablet Take 10 mEq by mouth 2 (two) times daily.  0  . levothyroxine (SYNTHROID, LEVOTHROID) 50 MCG tablet Take 50 mcg by mouth daily.  2  . metoprolol tartrate (LOPRESSOR) 25 MG tablet Take 12.5 mg by mouth 2 (two) times daily.  2  . simvastatin (ZOCOR) 20 MG tablet Take 20 mg by mouth at bedtime.  1  . spironolactone (ALDACTONE) 25 MG tablet Take 25 mg by mouth 2 (two) times daily.  1  . triamcinolone cream (KENALOG) 0.5 % Apply 1 application topically daily as needed (for sun exposure).    . nitroGLYCERIN (NITROSTAT) 0.3 MG SL tablet TAKE 1 TABLET UNDER THE TONGUE EVERY 5 MINUTES AS NEEDED  0  . sulindac (CLINORIL) 200 MG tablet Take 200 mg by mouth as needed (BACK PAIN).   0   No current facility-administered medications for this visit.    Allergies:   Review of patient's allergies indicates no known allergies.    Social History:  The patient  reports that she has been smoking Cigarettes.  She has been smoking about 0.50 packs per day. She does not have any smokeless tobacco history on file.   Family History:  The patient's family history includes Cancer in her father; Heart disease in her father and mother; Hypertension in her maternal grandmother and mother; Stroke in her brother and  mother. There is no history of Heart attack.    ROS:  Please see the history of present illness.   Otherwise, review of systems are positive for ankle swelling.   All other systems are reviewed and negative.    PHYSICAL EXAM: VS:  BP 100/60 mmHg  Pulse 102  Ht 5\' 4"  (1.626 m)  Wt 215 lb (97.523 kg)  BMI 36.89 kg/m2 , BMI Body mass index is 36.89 kg/(m^2). GEN: Well nourished, well developed, in no acute distress HEENT: normal Neck: no JVD, carotid bruits, or masses Cardiac: RRR; no murmurs, rubs, or gallops,no edema  Respiratory:  clear to auscultation bilaterally, normal work of breathing GI: soft, nontender, nondistended, + BS MS: no deformity or atrophy Skin: warm and dry, no rash Neuro:  Strength and sensation are intact Psych: euthymic mood, full affect   EKG:   The ekg ordered today demonstrates sinus tachycardia, PVCs   Recent Labs: 08/22/2015: ALT 13*; BUN 14; Creatinine, Ser 1.00; Magnesium 1.8; Potassium 3.7; Sodium 138; TSH 10.048* 08/24/2015: Hemoglobin 12.8; Platelets 250   Lipid Panel No results found for: CHOL, TRIG, HDL, CHOLHDL, VLDL, LDLCALC, LDLDIRECT   Other studies Reviewed: Additional studies/ records that were reviewed today with results demonstrating: cath record reviewed.   ASSESSMENT AND PLAN:  1. CAD:  Angina well controlled. Continue current medical therapy. I stressed the importance of exercise to help lose weight and improve her overall cardiac status.   2. Hypertension: Blood pressure well controlled. Continue current medicines. 3. Hyperlipidemia: Continue simvastatin. 4. Aortic atherosclerosis: I reviewed her CT scan results. There is no evidence of aneurysm. This was clarified to the patient. She needs risk factor modification to help prevent additional atherosclerosis. 5. She was given instructions about reducing salt intake in her diet.   Current medicines are reviewed at length with the patient today.  The patient concerns regarding  her medicines were addressed.  The following changes have been made:  No change  Labs/ tests ordered today include:  No orders of the defined types were placed in this encounter.    Recommend 150 minutes/week of aerobic exercise Low fat, low carb, high fiber diet recommended  Disposition:   FU with Dr. Martinique in 6 months.  She was inadvertently placed on my clinic schedule today.   Teresita Madura., MD  11/24/2015 10:02 AM    [facility] [place], [place], [place]  [address] Phone: [phone]; Fax: [phone]

## 2015-11-25 NOTE — Addendum Note (Signed)
Addended by: Freada Bergeron on: 11/25/2015 05:10 PM   Modules accepted: Orders

## 2016-01-14 ENCOUNTER — Other Ambulatory Visit: Payer: Self-pay | Admitting: Cardiology

## 2016-01-15 NOTE — Telephone Encounter (Signed)
Rx(s) sent to pharmacy electronically.  

## 2016-03-14 ENCOUNTER — Emergency Department (HOSPITAL_COMMUNITY)
Admission: EM | Admit: 2016-03-14 | Discharge: 2016-03-14 | Disposition: A | Discharge status: Home / Self Care | Payer: Medicaid Other | Attending: Emergency Medicine | Admitting: Emergency Medicine

## 2016-03-14 DIAGNOSIS — H578 Other specified disorders of eye and adnexa: Secondary | ICD-10-CM | POA: Diagnosis present

## 2016-03-14 DIAGNOSIS — I2510 Atherosclerotic heart disease of native coronary artery without angina pectoris: Secondary | ICD-10-CM | POA: Diagnosis not present

## 2016-03-14 DIAGNOSIS — I5032 Chronic diastolic (congestive) heart failure: Secondary | ICD-10-CM | POA: Diagnosis not present

## 2016-03-14 DIAGNOSIS — Z79899 Other long term (current) drug therapy: Secondary | ICD-10-CM | POA: Diagnosis not present

## 2016-03-14 DIAGNOSIS — F17210 Nicotine dependence, cigarettes, uncomplicated: Secondary | ICD-10-CM | POA: Diagnosis not present

## 2016-03-14 DIAGNOSIS — I110 Hypertensive heart disease with heart failure: Secondary | ICD-10-CM | POA: Insufficient documentation

## 2016-03-14 DIAGNOSIS — E039 Hypothyroidism, unspecified: Secondary | ICD-10-CM | POA: Insufficient documentation

## 2016-03-14 DIAGNOSIS — Z955 Presence of coronary angioplasty implant and graft: Secondary | ICD-10-CM | POA: Insufficient documentation

## 2016-03-14 DIAGNOSIS — Z8673 Personal history of transient ischemic attack (TIA), and cerebral infarction without residual deficits: Secondary | ICD-10-CM | POA: Insufficient documentation

## 2016-03-14 DIAGNOSIS — Z7982 Long term (current) use of aspirin: Secondary | ICD-10-CM | POA: Diagnosis not present

## 2016-03-14 DIAGNOSIS — H1131 Conjunctival hemorrhage, right eye: Secondary | ICD-10-CM

## 2016-03-14 NOTE — ED Provider Notes (Signed)
[facility] DEPT Provider Note   CSN: [id number] Arrival date & time: 03/14/16  1925     History   Chief Complaint Chief Complaint  Patient presents with  . Eye Problem    HPI Jocelyn [name] is a 55 y.o. female. She has no physical complaint. She states that her 55 year old relative was looking at her today and asked her what was wrong with her eye. She has blood noted in her right eye on her sclera. Family was concerned "that she might have had a stroke".  HPI  Past Medical History:  Diagnosis Date  . Chronic diastolic CHF (congestive heart failure), NYHA class 2 ([place])   . HTN (hypertension)   . Hypercholesterolemia   . Hypothyroidism (acquired)   . Myocardial infarction ([place])   . Thyrotoxicosis   . TIA (transient ischemic attack)     Patient Active Problem List   Diagnosis Date Noted  . Aortic atherosclerosis ([place]) 11/24/2015  . Hypothyroidism (acquired)   . Unstable angina ([place]) 08/21/2015  . Pain in the chest   . Coronary artery disease involving native coronary artery   . CAD (coronary artery disease), native coronary artery 01/14/2015  . HTN (hypertension) 01/14/2015  . Hypercholesterolemia 01/14/2015  . Tobacco abuse 01/14/2015    Past Surgical History:  Procedure Laterality Date  . BREAST CYST EXCISION    . CARDIAC CATHETERIZATION N/A 08/24/2015   Procedure: Left Heart Cath and Coronary Angiography;  Surgeon: Jettie Booze, MD;  Location: [facility];  Service: Cardiovascular;  Laterality: N/A;  . CORONARY STENT PLACEMENT  1999  . KNEE SURGERY    . partial hysterec    . THYROIDECTOMY      OB History    No data available       Home Medications    Prior to Admission medications   Medication Sig Start Date End Date Taking? Authorizing Provider  albuterol (PROVENTIL HFA;VENTOLIN HFA) 108 (90 Base) MCG/ACT inhaler Inhale 2 puffs into the lungs every 4 (four) hours as needed for shortness of breath.  06/21/15 06/20/16  Historical  Provider, MD  ALPRAZolam [name]) 0.5 MG tablet Take 1 mg by mouth 3 (three) times daily as needed for anxiety or sleep.  02/27/15   Historical Provider, MD  aspirin EC 81 MG EC tablet Take 1 tablet (81 mg total) by mouth daily. 08/24/15   Jettie Booze, MD  Cholecalciferol (VITAMIN D-3) 1000 units CAPS Take 1 capsule by mouth daily.    Historical Provider, MD  cyclobenzaprine (FLEXERIL) 10 MG tablet Take 10 mg by mouth daily.  06/30/15   Historical Provider, MD  doxepin (SINEQUAN) 10 MG capsule Take 10-20 mg by mouth at bedtime as needed (for sleep).  01/02/15   Historical Provider, MD  estradiol (ESTRACE) 0.5 MG tablet Take 0.5 mg by mouth daily. 01/05/15   Historical Provider, MD  FLUoxetine (PROZAC) 20 MG capsule Take 20 mg by mouth daily as needed (ANXIETY).  07/27/15   Historical Provider, MD  furosemide (LASIX) 40 MG tablet Take 40 mg by mouth daily as needed for fluid.    Historical Provider, MD  isosorbide mononitrate (IMDUR) 30 MG 24 hr tablet Take 1 tablet (30 mg total) by mouth daily. PLEASE CONTACT OFFICE FOR APPOINTMENT 01/15/16   Peter M Martinique, MD  KLOR-CON M10 10 MEQ tablet Take 10 mEq by mouth 2 (two) times daily. 07/06/15   Historical Provider, MD  levothyroxine (SYNTHROID, LEVOTHROID) 50 MCG tablet Take 50 mcg by mouth daily. 10/27/15  Historical Provider, MD  metoprolol tartrate (LOPRESSOR) 25 MG tablet Take 12.5 mg by mouth 2 (two) times daily. 01/03/15   Historical Provider, MD  nitroGLYCERIN (NITROSTAT) 0.3 MG SL tablet TAKE 1 TABLET UNDER THE TONGUE EVERY 5 MINUTES AS NEEDED 10/28/15   Historical Provider, MD  simvastatin (ZOCOR) 20 MG tablet Take 20 mg by mouth at bedtime. 01/03/15   Historical Provider, MD  spironolactone (ALDACTONE) 25 MG tablet Take 25 mg by mouth 2 (two) times daily. 01/03/15   Historical Provider, MD  sulindac (CLINORIL) 200 MG tablet Take 200 mg by mouth as needed (BACK PAIN).  10/29/15   Historical Provider, MD  triamcinolone cream (KENALOG) 0.5 % Apply 1  application topically daily as needed (for sun exposure).    Historical Provider, MD    Family History Family History  Problem Relation Age of Onset  . Heart disease Mother   . Heart disease Father   . Cancer Father   . Heart attack Neg Hx   . Stroke Mother   . Stroke Brother   . Hypertension Mother   . Hypertension Maternal Grandmother     Social History Social History  Substance Use Topics  . Smoking status: Current Every Day Smoker    Packs/day: 0.50    Types: Cigarettes  . Smokeless tobacco: Not on file  . Alcohol use Not on file     Allergies   Review of patient's allergies indicates no known allergies.   Review of Systems Review of Systems  Constitutional: Negative for appetite change, chills, diaphoresis, fatigue and fever.  HENT: Negative for mouth sores, sore throat and trouble swallowing.   Eyes: Negative for visual disturbance.  Respiratory: Negative for cough, chest tightness, shortness of breath and wheezing.   Cardiovascular: Negative for chest pain.  Gastrointestinal: Negative for abdominal distention, abdominal pain, diarrhea, nausea and vomiting.  Endocrine: Negative for polydipsia, polyphagia and polyuria.  Genitourinary: Negative for dysuria, frequency and hematuria.  Musculoskeletal: Negative for gait problem.  Skin: Negative for color change, pallor and rash.  Neurological: Negative for dizziness, syncope, light-headedness and headaches.  Hematological: Does not bruise/bleed easily.  Psychiatric/Behavioral: Negative for behavioral problems and confusion.     Physical Exam Updated Vital Signs BP 113/70 (BP Location: Left Arm)   Pulse 92   Temp 98.6 F (37 C) (Oral)   Resp 18   SpO2 93%   Physical Exam  Constitutional: She is oriented to person, place, and time. She appears well-developed and well-nourished. No distress.  HENT:  Head: Normocephalic.  Eyes: Conjunctivae are normal. Pupils are equal, round, and reactive to light. No  scleral icterus.  Right eyes superior subconjunctival hemorrhage from 9:00 to 2:00. Normal pupil. No hyphema. Normal vision.  Neck: Normal range of motion. Neck supple. No thyromegaly present.  Cardiovascular: Normal rate and regular rhythm.  Exam reveals no gallop and no friction rub.   No murmur heard. Pulmonary/Chest: Effort normal and breath sounds normal. No respiratory distress. She has no wheezes. She has no rales.  Abdominal: Soft. Bowel sounds are normal. She exhibits no distension. There is no tenderness. There is no rebound.  Musculoskeletal: Normal range of motion.  Neurological: She is alert and oriented to person, place, and time.  Skin: Skin is warm and dry. No rash noted.  Psychiatric: She has a normal mood and affect. Her behavior is normal.     ED Treatments / Results  Labs (all labs ordered are listed, but only abnormal results are displayed) Labs Reviewed -  No data to display  EKG  EKG Interpretation None       Radiology No results found.  Procedures Procedures (including critical care time)  Medications Ordered in ED Medications - No data to display   Initial Impression / Assessment and Plan / ED Course  I have reviewed the triage vital signs and the nursing notes.  Pertinent labs & imaging results that were available during my care of the patient were reviewed by me and considered in my medical decision making (see chart for details).  Clinical Course    Reassured. No specific treatment. Expectant management.  Final Clinical Impressions(s) / ED Diagnoses   Final diagnoses:  Subconjunctival hemorrhage of right eye    New Prescriptions Discharge Medication List as of 03/14/2016  8:04 PM       Tanna Furry, MD 03/14/16 2120

## 2016-03-14 NOTE — ED Triage Notes (Signed)
Pt states that she first noticed a redness in her R eye 3-4 hours ago. States that she denies injury. Alert and oriented. Neuro intact.

## 2016-03-14 NOTE — ED Notes (Signed)
Patient was alert, oriented and stable upon discharge. RN went over AVS and patient had no further questions.  

## 2016-03-14 NOTE — Discharge Instructions (Signed)
This is a benign condition. This requires no treatment other than time.

## 2016-04-06 DIAGNOSIS — F419 Anxiety disorder, unspecified: Secondary | ICD-10-CM | POA: Insufficient documentation

## 2016-05-09 ENCOUNTER — Other Ambulatory Visit (HOSPITAL_BASED_OUTPATIENT_CLINIC_OR_DEPARTMENT_OTHER): Payer: Self-pay

## 2016-05-09 DIAGNOSIS — G4730 Sleep apnea, unspecified: Secondary | ICD-10-CM

## 2016-05-09 DIAGNOSIS — R5383 Other fatigue: Secondary | ICD-10-CM

## 2016-05-09 DIAGNOSIS — R0683 Snoring: Secondary | ICD-10-CM

## 2016-05-20 ENCOUNTER — Other Ambulatory Visit: Payer: Self-pay | Admitting: Cardiology

## 2016-06-02 ENCOUNTER — Ambulatory Visit (HOSPITAL_BASED_OUTPATIENT_CLINIC_OR_DEPARTMENT_OTHER): Payer: Medicaid Other | Attending: Internal Medicine | Admitting: Internal Medicine

## 2016-06-02 VITALS — Ht 64.0 in | Wt 200.0 lb

## 2016-06-02 DIAGNOSIS — Z79899 Other long term (current) drug therapy: Secondary | ICD-10-CM | POA: Diagnosis not present

## 2016-06-02 DIAGNOSIS — I493 Ventricular premature depolarization: Secondary | ICD-10-CM | POA: Insufficient documentation

## 2016-06-02 DIAGNOSIS — R5383 Other fatigue: Secondary | ICD-10-CM | POA: Diagnosis not present

## 2016-06-02 DIAGNOSIS — R0683 Snoring: Secondary | ICD-10-CM | POA: Insufficient documentation

## 2016-06-02 DIAGNOSIS — G4733 Obstructive sleep apnea (adult) (pediatric): Secondary | ICD-10-CM | POA: Insufficient documentation

## 2016-06-02 DIAGNOSIS — G4730 Sleep apnea, unspecified: Secondary | ICD-10-CM

## 2016-06-11 DIAGNOSIS — R0683 Snoring: Secondary | ICD-10-CM | POA: Diagnosis not present

## 2016-06-11 NOTE — Procedures (Signed)
   Patient Name: Jocelyn [name], Jocelyn [name] Date: 06/02/2016 Gender: Female D.O.B: 01-12-1961 Age (years): 54 Referring Provider: Doristine Section Bonsu Height (inches): 64 Interpreting Physician: Baird Lyons MD, ABSM Weight (lbs): 200 RPSGT: Gerhard Perches BMI: 34 MRN: [id number] Neck Size: 14.25 CLINICAL INFORMATION Sleep Study Type: NPSG Indication for sleep study: Fatigue, Snoring, Witnessed Apneas Epworth Sleepiness Score: 13  SLEEP STUDY TECHNIQUE As per the AASM Manual for the Scoring of Sleep and Associated Events v2.3 (April 2016) with a hypopnea requiring 4% desaturations. The channels recorded and monitored were frontal, central and occipital EEG, electrooculogram (EOG), submentalis EMG (chin), nasal and oral airflow, thoracic and abdominal wall motion, anterior tibialis EMG, snore microphone, electrocardiogram, and pulse oximetry.  MEDICATIONS Medications self-administered by patient taken the night of the study : METOPROLOL TARTRATE, Methocarbamol, ALPRAZOLAM  SLEEP ARCHITECTURE The study was initiated at 11:03:32 PM and ended at 5:05:36 AM. Sleep onset time was 0.5 minutes and the sleep efficiency was 96.8%. The total sleep time was 350.6 minutes. Stage REM latency was 85.0 minutes. The patient spent 4.14% of the night in stage N1 sleep, 73.19% in stage N2 sleep, 0.00% in stage N3 and 22.68% in REM. Alpha intrusion was absent. Supine sleep was 6.54%.  RESPIRATORY PARAMETERS The overall apnea/hypopnea index (AHI) was 6.7 per hour. There were 14 total apneas, including 14 obstructive, 0 central and 0 mixed apneas. There were 25 hypopneas and 13 RERAs. The AHI during Stage REM sleep was 23.4 per hour. AHI while supine was 18.3 per hour. The mean oxygen saturation was 93.69%. The minimum SpO2 during sleep was 83.00%. Moderate snoring was noted during this study.  CARDIAC DATA The 2 lead EKG demonstrated sinus rhythm. The mean heart rate was 88.41 beats per minute. Other  EKG findings include: PVCs.  LEG MOVEMENT DATA The total PLMS were 0 with a resulting PLMS index of 0.00. Associated arousal with leg movement index was 0.0 .  IMPRESSIONS - Mild obstructive sleep apnea occurred during this study (AHI = 6.7/h). - Insufficient early events to meet protocol requirements for split CPAP titration. - No significant central sleep apnea occurred during this study (CAI = 0.0/h). - Mild oxygen desaturation was noted during this study (Min O2 = 83.00%). - The patient snored with Moderate snoring volume. - EKG findings include PVCs. - Clinically significant periodic limb movements did not occur during sleep. No significant associated arousals.  DIAGNOSIS - Obstructive Sleep Apnea (327.23 [G47.33 ICD-10])  RECOMMENDATIONS - Positional therapy avoiding supine position during sleep. - Very mild obstructive sleep apnea. Return to discuss treatment options. - Avoid alcohol, sedatives and other CNS depressants that may worsen sleep apnea and disrupt normal sleep architecture. - Sleep hygiene should be reviewed to assess factors that may improve sleep quality. - Weight management and regular exercise should be initiated or continued if appropriate.  [Electronically signed] 06/11/2016 12:51 PM  Baird Lyons MD, [place], American Board of Sleep Medicine   NPI: [id number] [place], [place] [facility]  ELECTRONICALLY SIGNED ON:  06/11/2016, 12:51 PM [place] PH: [phone]   FX: [phone] [place]

## 2016-06-29 ENCOUNTER — Encounter (INDEPENDENT_AMBULATORY_CARE_PROVIDER_SITE_OTHER): Payer: Self-pay | Admitting: Orthopaedic Surgery

## 2016-06-29 ENCOUNTER — Ambulatory Visit (INDEPENDENT_AMBULATORY_CARE_PROVIDER_SITE_OTHER): Payer: Medicaid Other | Admitting: Orthopaedic Surgery

## 2016-06-29 ENCOUNTER — Ambulatory Visit (INDEPENDENT_AMBULATORY_CARE_PROVIDER_SITE_OTHER): Payer: Medicaid Other

## 2016-06-29 VITALS — BP 115/85 | HR 84 | Ht 64.0 in | Wt 210.0 lb

## 2016-06-29 DIAGNOSIS — M545 Low back pain: Secondary | ICD-10-CM

## 2016-06-29 NOTE — Progress Notes (Signed)
Office Visit Note   Patient: Jocelyn [name]           Date of Birth: [birth year]           MRN: [id number] Visit Date: 06/29/2016              Requested by: Benito Mccreedy, MD [place] [id number] [place], [place] [id number] PCP: Benito Mccreedy, MD   Assessment & Plan: Visit Diagnoses:  1. Bilateral low back pain, unspecified chronicity, with sciatica presence unspecified     Plan: Patient's been on anti-inflammatories Clinoril without relief. She's had persistent intermittent pain with numbness on the right side. She's been through chiropractic treatments. We'll proceed with lumbar MRI to rule out lateral recess stenosis.  Follow-Up Instructions: No Follow-up on file.   Orders:  Orders Placed This Encounter  Procedures  . XR Lumbar Spine 2-3 Views   No orders of the defined types were placed in this encounter.     Procedures: No procedures performed   Clinical Data: No additional findings.   Subjective: Chief Complaint  Patient presents with  . Lower Back - Pain    Patient is here with complaints of low back pain. She states that she used to work with [employer] and was deployed and moved suitcase wrong and irritated sciatic nerve. This was two years ago. She has had back pain off and on since then. This last occurrence has been about 2-3 months. She states that she has back pain that radiates between sides but is mostly on the right. The pain goes to about the level of mid leg on the right. The pain does not radiate down left leg. She states that her right leg feels different than the left. She denies numbness. She has seen a chiropractor for the past few weeks and has gotten adjustments. She states that he took x-rays and said her right hip is lower than the left.    Review of Systems  Constitutional: Negative for chills and diaphoresis.  HENT: Negative for ear discharge, ear pain and nosebleeds.   Eyes: Negative for discharge and visual disturbance.    Respiratory: Negative for cough, choking and shortness of breath.   Cardiovascular: Negative for chest pain and palpitations.       Patient's previous history of coronary artery disease status post stent later had problems with the blockage of the stent but has some collateral flow.  Gastrointestinal: Negative for abdominal distention and abdominal pain.  Endocrine: Negative for cold intolerance and heat intolerance.  Genitourinary: Negative for flank pain and hematuria.  Musculoskeletal:       Low back pain times greater than that 2 years with back pain of right leg pain and right leg numbness.  Skin: Negative for rash and wound.  Neurological: Negative for seizures and speech difficulty.       Patient states she had a history of a mini stroke about 5 years ago  Hematological: Negative for adenopathy. Does not bruise/bleed easily.  Psychiatric/Behavioral: Negative for agitation and suicidal ideas.     Objective: Vital Signs: BP 115/85   Pulse 84   Ht 5\' 4"  (1.626 m)   Wt 210 lb (95.3 kg)   BMI 36.05 kg/m   Physical Exam  Constitutional: She is oriented to person, place, and time. She appears well-developed.  HENT:  Head: Normocephalic.  Right Ear: External ear normal.  Left Ear: External ear normal.  Eyes: Pupils are equal, round, and reactive to light.  Neck: Normal range of  motion. No tracheal deviation present. No thyromegaly present.  There is partial thyroid resection. Well-healed incision.  Cardiovascular: Normal rate.   Pulmonary/Chest: Effort normal.  Abdominal: Soft.  Musculoskeletal:  Patient's able heel and toe walk. She slow getting on and off the exam table. Negative logroll both hips good internal/external rotation of hip flexion contracture reflexes are 1+ and symmetrical. Spine is straight there is tenderness over the lumbosacral junction and tenderness over the right sciatic notch name on the left.  Neurological: She is alert and oriented to person, place,  and time.  Skin: Skin is warm and dry.  Psychiatric: She has a normal mood and affect. Her behavior is normal.    Ortho Exam no synovitis the wrist upper extremities no rash or exposed skin. Negative straight leg raising 90. Anterior tib EHL is strong peroneal's are strong. She has bilateral trochanteric bursal tenderness. Tenderness over the L4-5 interspinous ligament.  Specialty Comments:  No specialty comments available.  Imaging: Xr Lumbar Spine 2-3 Views  Result Date: 06/29/2016 AP and lateral lumbar x-rays are obtained and reviewed with the patient. This shows normal bony architecture no disc space narrowing normal lumbar curvature. Mild facet sclerosis at L4-5. No anterolisthesis. Impression: Normal lumbar x-rays other than the slight facet degenerative changes    PMFS History: Patient Active Problem List   Diagnosis Date Noted  . Aortic atherosclerosis ([place]) 11/24/2015  . Hypothyroidism (acquired)   . Unstable angina ([place]) 08/21/2015  . Pain in the chest   . Coronary artery disease involving native coronary artery   . CAD (coronary artery disease), native coronary artery 01/14/2015  . HTN (hypertension) 01/14/2015  . Hypercholesterolemia 01/14/2015  . Tobacco abuse 01/14/2015   Past Medical History:  Diagnosis Date  . Chronic diastolic CHF (congestive heart failure), NYHA class 2 ([place])   . HTN (hypertension)   . Hypercholesterolemia   . Hypothyroidism (acquired)   . Myocardial infarction   . Thyrotoxicosis   . TIA (transient ischemic attack)     Family History  Problem Relation Age of Onset  . Heart disease Mother   . Heart disease Father   . Cancer Father   . Heart attack Neg Hx   . Stroke Mother   . Stroke Brother   . Hypertension Mother   . Hypertension Maternal Grandmother     Past Surgical History:  Procedure Laterality Date  . BREAST CYST EXCISION    . CARDIAC CATHETERIZATION N/A 08/24/2015   Procedure: Left Heart Cath and Coronary Angiography;   Surgeon: Jettie Booze, MD;  Location: [facility];  Service: Cardiovascular;  Laterality: N/A;  . CORONARY STENT PLACEMENT  1999  . KNEE SURGERY    . partial hysterec    . THYROIDECTOMY     Social History   Occupational History  . [employer]    Social History Main Topics  . Smoking status: Current Every Day Smoker    Packs/day: 0.50    Types: Cigarettes  . Smokeless tobacco: Not on file  . Alcohol use Not on file  . Drug use: Unknown  . Sexual activity: Not on file

## 2016-06-29 NOTE — Patient Instructions (Signed)
Patient can continue chiropractic treatment. She'll return to see Dr. Lorin Mercy after MRI is performed to review the scan. Should continue her walking program.

## 2016-07-10 ENCOUNTER — Ambulatory Visit
Admission: RE | Admit: 2016-07-10 | Discharge: 2016-07-10 | Disposition: A | Discharge status: Home / Self Care | Payer: Medicaid Other | Source: Ambulatory Visit | Attending: Orthopaedic Surgery | Admitting: Orthopaedic Surgery

## 2016-07-10 DIAGNOSIS — M545 Low back pain: Secondary | ICD-10-CM

## 2016-07-16 ENCOUNTER — Observation Stay (HOSPITAL_COMMUNITY)
Admission: EM | Admit: 2016-07-16 | Discharge: 2016-07-17 | Disposition: A | Discharge status: Home / Self Care | Payer: Medicaid Other | Attending: Internal Medicine | Admitting: Internal Medicine

## 2016-07-16 ENCOUNTER — Encounter (HOSPITAL_COMMUNITY): Payer: Self-pay | Admitting: *Deleted

## 2016-07-16 ENCOUNTER — Emergency Department (HOSPITAL_COMMUNITY): Payer: Medicaid Other

## 2016-07-16 DIAGNOSIS — F17210 Nicotine dependence, cigarettes, uncomplicated: Secondary | ICD-10-CM | POA: Insufficient documentation

## 2016-07-16 DIAGNOSIS — Z8249 Family history of ischemic heart disease and other diseases of the circulatory system: Secondary | ICD-10-CM | POA: Insufficient documentation

## 2016-07-16 DIAGNOSIS — I5032 Chronic diastolic (congestive) heart failure: Secondary | ICD-10-CM | POA: Insufficient documentation

## 2016-07-16 DIAGNOSIS — R079 Chest pain, unspecified: Secondary | ICD-10-CM | POA: Diagnosis not present

## 2016-07-16 DIAGNOSIS — Z8673 Personal history of transient ischemic attack (TIA), and cerebral infarction without residual deficits: Secondary | ICD-10-CM | POA: Diagnosis not present

## 2016-07-16 DIAGNOSIS — E7800 Pure hypercholesterolemia, unspecified: Secondary | ICD-10-CM | POA: Diagnosis not present

## 2016-07-16 DIAGNOSIS — I2582 Chronic total occlusion of coronary artery: Secondary | ICD-10-CM | POA: Diagnosis not present

## 2016-07-16 DIAGNOSIS — I959 Hypotension, unspecified: Secondary | ICD-10-CM | POA: Diagnosis not present

## 2016-07-16 DIAGNOSIS — R0789 Other chest pain: Principal | ICD-10-CM | POA: Insufficient documentation

## 2016-07-16 DIAGNOSIS — Z7982 Long term (current) use of aspirin: Secondary | ICD-10-CM | POA: Diagnosis not present

## 2016-07-16 DIAGNOSIS — T82855D Stenosis of coronary artery stent, subsequent encounter: Secondary | ICD-10-CM | POA: Insufficient documentation

## 2016-07-16 DIAGNOSIS — I700 Atherosclerosis of aorta: Secondary | ICD-10-CM | POA: Insufficient documentation

## 2016-07-16 DIAGNOSIS — I252 Old myocardial infarction: Secondary | ICD-10-CM | POA: Insufficient documentation

## 2016-07-16 DIAGNOSIS — I10 Essential (primary) hypertension: Secondary | ICD-10-CM | POA: Diagnosis present

## 2016-07-16 DIAGNOSIS — I2510 Atherosclerotic heart disease of native coronary artery without angina pectoris: Secondary | ICD-10-CM

## 2016-07-16 DIAGNOSIS — Z823 Family history of stroke: Secondary | ICD-10-CM | POA: Diagnosis not present

## 2016-07-16 DIAGNOSIS — F418 Other specified anxiety disorders: Secondary | ICD-10-CM | POA: Diagnosis not present

## 2016-07-16 DIAGNOSIS — E890 Postprocedural hypothyroidism: Secondary | ICD-10-CM | POA: Diagnosis not present

## 2016-07-16 DIAGNOSIS — I25119 Atherosclerotic heart disease of native coronary artery with unspecified angina pectoris: Secondary | ICD-10-CM | POA: Diagnosis not present

## 2016-07-16 DIAGNOSIS — I110 Hypertensive heart disease with heart failure: Secondary | ICD-10-CM | POA: Diagnosis not present

## 2016-07-16 DIAGNOSIS — I200 Unstable angina: Secondary | ICD-10-CM

## 2016-07-16 LAB — CBC
HEMATOCRIT: 39 % (ref 36.0–46.0)
HEMOGLOBIN: 13.4 g/dL (ref 12.0–15.0)
MCH: 29.7 pg (ref 26.0–34.0)
MCHC: 34.4 g/dL (ref 30.0–36.0)
MCV: 86.5 fL (ref 78.0–100.0)
PLATELETS: 298 10*3/uL (ref 150–400)
RBC: 4.51 MIL/uL (ref 3.87–5.11)
RDW: 13.2 % (ref 11.5–15.5)
WBC: 7.8 10*3/uL (ref 4.0–10.5)

## 2016-07-16 LAB — BASIC METABOLIC PANEL
ANION GAP: 8 (ref 5–15)
BUN: 15 mg/dL (ref 6–20)
CHLORIDE: 104 mmol/L (ref 101–111)
CO2: 26 mmol/L (ref 22–32)
CREATININE: 1.13 mg/dL — AB (ref 0.44–1.00)
Calcium: 9.6 mg/dL (ref 8.9–10.3)
GFR calc non Af Amer: 54 mL/min — ABNORMAL LOW (ref >60)
Glucose, Bld: 104 mg/dL — ABNORMAL HIGH (ref 65–99)
POTASSIUM: 4.4 mmol/L (ref 3.5–5.1)
SODIUM: 138 mmol/L (ref 135–145)

## 2016-07-16 LAB — TROPONIN I: Troponin I: <0.03 ng/mL (ref <0.03)

## 2016-07-16 MED ORDER — ASPIRIN 325 MG PO TABS
325.0000 mg | ORAL_TABLET | Freq: Every day | ORAL | Status: DC
  Administered 2016-07-17: 325 mg via ORAL
  Filled 2016-07-16: qty 1

## 2016-07-16 MED ORDER — ISOSORBIDE MONONITRATE ER 60 MG PO TB24
60.0000 mg | ORAL_TABLET | Freq: Every day | ORAL | Status: DC
  Administered 2016-07-17: 60 mg via ORAL
  Filled 2016-07-16: qty 1

## 2016-07-16 MED ORDER — ALBUTEROL SULFATE (2.5 MG/3ML) 0.083% IN NEBU: 3.0000 mL | INHALATION_SOLUTION | RESPIRATORY_TRACT | Status: DC | PRN

## 2016-07-16 MED ORDER — DOXEPIN HCL 10 MG PO CAPS: 10.0000 mg | ORAL_CAPSULE | Freq: Every evening | ORAL | Status: DC | PRN

## 2016-07-16 MED ORDER — METOPROLOL TARTRATE 12.5 MG HALF TABLET: 12.5000 mg | ORAL_TABLET | Freq: Two times a day (BID) | ORAL | Status: DC

## 2016-07-16 MED ORDER — HYDROCODONE-IBUPROFEN 7.5-200 MG PO TABS: 1.0000 | ORAL_TABLET | Freq: Four times a day (QID) | ORAL | Status: DC | PRN

## 2016-07-16 MED ORDER — SIMVASTATIN 20 MG PO TABS
20.0000 mg | ORAL_TABLET | Freq: Every day | ORAL | Status: DC
  Administered 2016-07-17: 20 mg via ORAL
  Filled 2016-07-16 (×2): qty 1

## 2016-07-16 MED ORDER — SODIUM CHLORIDE 0.9% FLUSH
3.0000 mL | Freq: Two times a day (BID) | INTRAVENOUS | Status: DC
  Administered 2016-07-16: 3 mL via INTRAVENOUS

## 2016-07-16 MED ORDER — SODIUM CHLORIDE 0.9% FLUSH: 3.0000 mL | INTRAVENOUS | Status: DC | PRN

## 2016-07-16 MED ORDER — ENOXAPARIN SODIUM 40 MG/0.4ML ~~LOC~~ SOLN
40.0000 mg | Freq: Every day | SUBCUTANEOUS | Status: DC
  Administered 2016-07-17: 40 mg via SUBCUTANEOUS
  Filled 2016-07-16: qty 0.4

## 2016-07-16 MED ORDER — HYDROCODONE-ACETAMINOPHEN 5-325 MG PO TABS: 1.0000 | ORAL_TABLET | ORAL | Status: DC | PRN

## 2016-07-16 MED ORDER — ONDANSETRON HCL 4 MG/2ML IJ SOLN: 4.0000 mg | Freq: Four times a day (QID) | INTRAMUSCULAR | Status: DC | PRN

## 2016-07-16 MED ORDER — SODIUM CHLORIDE 0.9 % IV SOLN
250.0000 mL | INTRAVENOUS | Status: DC | PRN
Start: 2016-07-16 — End: 2016-07-17

## 2016-07-16 MED ORDER — CYCLOBENZAPRINE HCL 10 MG PO TABS
10.0000 mg | ORAL_TABLET | Freq: Three times a day (TID) | ORAL | Status: DC | PRN
  Administered 2016-07-17: 10 mg via ORAL
  Filled 2016-07-16: qty 1

## 2016-07-16 MED ORDER — NITROGLYCERIN 0.4 MG SL SUBL
0.4000 mg | SUBLINGUAL_TABLET | SUBLINGUAL | Status: DC | PRN
  Administered 2016-07-17: 0.4 mg via SUBLINGUAL
  Filled 2016-07-16: qty 1

## 2016-07-16 MED ORDER — ACETAMINOPHEN 325 MG PO TABS: 650.0000 mg | ORAL_TABLET | ORAL | Status: DC | PRN

## 2016-07-16 MED ORDER — ALPRAZOLAM 0.5 MG PO TABS
1.0000 mg | ORAL_TABLET | Freq: Three times a day (TID) | ORAL | Status: DC | PRN
  Administered 2016-07-17: 1 mg via ORAL
  Filled 2016-07-16: qty 2

## 2016-07-16 MED ORDER — PROMETHAZINE-CODEINE 6.25-10 MG/5ML PO SYRP: 5.0000 mL | ORAL_SOLUTION | Freq: Four times a day (QID) | ORAL | Status: DC | PRN

## 2016-07-16 NOTE — Consult Note (Signed)
Patient ID: Jocelyn [name] MRN: [id number], DOB/AGE: June 10, 1961  Admit date: 07/16/2016 Primary Physician: Benito Mccreedy, MD Primary Cardiologist: Irish Lack, MD  CARDIOLOGY CONSULT NOTE   PATIENT PROFILE   Jocelyn [name] is a 55 y.o. female  who has had coronary artery disease. She had a chronically occluded obtuse marginal noted on catheterization from January 2017 (in-stent). She has a history of diastolic dysfunction, and occasional angina that is medically managed.   HISTORY OF PRESENT ILLNESS   The patient presents with chest heaviness and left shoulder discomfort. She states that she experiences these symptoms occasionally as an outpatient. Regarding frequency of these symptoms, it is difficult to get a direct answer, but it seems as though she will go months without any chest discomfort, but then some times will experience this several times within one day. It is occasionally at rest, occasionally with exertion, and often with stress/anxiety. Yesterday she was moving furniture and belongings around the house. Later that evening she experienced chest heaviness and left arm pain that lasted about one hour before self-resolving. This morning she had three discrete episodes each lasting about 15-30 minutes over the morning. She and her sister came to the ED for evaluation. These episodes have persisted throughout the day, and when the chest pain subsides her shoulder heaviness persists. While we were talking she had one episode of chest pain that lasted about two minutes (ECG unchanged at the time). She denies syncope, presyncope, palpitations, orthopnea, SOB/dyspnea, diaphoresis, jaw pain, LE edema, weight gain. She states she is unsure if she just "overworked" yesterday and that's why her shoulder hurts.  Review of Systems General:  No chills, fever, night sweats or weight changes.  Cardiovascular:  See HPI Dermatological: No rash, lesions/masses Respiratory: No cough, dyspnea Urologic:  No hematuria, dysuria Abdominal:   No nausea, vomiting, diarrhea, bright red blood per rectum, melena, or hematemesis Neurologic:  No visual changes, wkns, changes in mental status. All other systems reviewed and are otherwise negative except as noted above.   MEDICAL, FAMILY, AND SOCIAL HISTORY   Past Medical History:  Diagnosis Date  . Chronic diastolic CHF (congestive heart failure), NYHA class 2 ([place])   . HTN (hypertension)   . Hypercholesterolemia   . Hypothyroidism (acquired)   . Myocardial infarction   . Thyrotoxicosis   . TIA (transient ischemic attack)     Past Surgical History:  Procedure Laterality Date  . BREAST CYST EXCISION    . CARDIAC CATHETERIZATION N/A 08/24/2015   Procedure: Left Heart Cath and Coronary Angiography;  Surgeon: Jettie Booze, MD;  Location: [facility];  Service: Cardiovascular;  Laterality: N/A;  . CORONARY STENT PLACEMENT  1999  . KNEE SURGERY    . partial hysterec    . THYROIDECTOMY      No Known Allergies Prior to Admission medications   Medication Sig Start Date End Date Taking? Authorizing Provider  albuterol (PROVENTIL HFA;VENTOLIN HFA) 108 (90 Base) MCG/ACT inhaler Inhale 2 puffs into the lungs every 4 (four) hours as needed for shortness of breath.  06/21/15 07/16/16 Yes Historical Provider, MD  ALPRAZolam (XANAX) 1 MG tablet TAKE 1 TABLET BY MOUTH 3 TIMES A DAY AS NEEDED FOR ANXIETY OR PANIC ATTACKS 06/03/16  Yes Historical Provider, MD  aspirin 325 MG tablet Take 325 mg by mouth daily.   Yes Historical Provider, MD  cyclobenzaprine (FLEXERIL) 10 MG tablet Take 10 mg by mouth 3 (three) times daily as needed for muscle spasms.  06/30/15  Yes Historical Provider,  MD  doxepin (SINEQUAN) 10 MG capsule Take 10-20 mg by mouth at bedtime as needed (for sleep).  01/02/15  Yes Historical Provider, MD  estradiol (ESTRACE) 0.5 MG tablet Take 0.5 mg by mouth daily. 01/05/15  Yes Historical Provider, MD  furosemide (LASIX) 40 MG tablet Take 40  mg by mouth daily.    Yes Historical Provider, MD  HYDROcodone-ibuprofen (VICOPROFEN) 7.5-200 MG tablet Take 1 tablet by mouth every 6 (six) hours as needed. 06/22/16  Yes Historical Provider, MD  isosorbide mononitrate (IMDUR) 30 MG 24 hr tablet Take 1 tablet (30 mg total) by mouth daily. 05/23/16  Yes Jocelyn M Martinique, MD  KLOR-CON M10 10 MEQ tablet Take 10 mEq by mouth 2 (two) times daily. 07/06/15  Yes Historical Provider, MD  levothyroxine (SYNTHROID, LEVOTHROID) 50 MCG tablet Take 50 mcg by mouth daily. 10/27/15  Yes Historical Provider, MD  methimazole (TAPAZOLE) 5 MG tablet Take 2.5 mg by mouth daily. 05/29/16  Yes Historical Provider, MD  methocarbamol (ROBAXIN) 500 MG tablet TAKE 2 TABLETS BY MOUTH FOUR TIMES A DAY FOR 5 DAYS 04/29/16  Yes Historical Provider, MD  metoprolol tartrate (LOPRESSOR) 25 MG tablet Take 12.5 mg by mouth 2 (two) times daily. 01/03/15  Yes Historical Provider, MD  nitroGLYCERIN (NITROSTAT) 0.3 MG SL tablet TAKE 1 TABLET UNDER THE TONGUE EVERY 5 MINUTES AS NEEDED FOR CHEST PAIN 10/28/15  Yes Historical Provider, MD  promethazine-codeine (PHENERGAN WITH CODEINE) 6.25-10 MG/5ML syrup Take 5 mLs by mouth every 6 (six) hours as needed for cough.   Yes Historical Provider, MD  simvastatin (ZOCOR) 20 MG tablet Take 20 mg by mouth at bedtime. 01/03/15  Yes Historical Provider, MD  spironolactone (ALDACTONE) 25 MG tablet Take 25 mg by mouth 2 (two) times daily. 01/03/15  Yes Historical Provider, MD  sulindac (CLINORIL) 200 MG tablet Take 200 mg by mouth daily as needed (BACK PAIN).  10/29/15  Yes Historical Provider, MD  triamcinolone cream (KENALOG) 0.5 % Apply 1 application topically daily as needed (for sun exposure).   Yes Historical Provider, MD   Family History  Problem Relation Age of Onset  . Heart disease Mother   . Stroke Mother   . Hypertension Mother   . Heart disease Father   . Cancer Father   . Stroke Brother   . Hypertension Maternal Grandmother   . Heart attack  Neg Hx    Social History   Social History  . Marital status: Legally Separated    Spouse name: N/A  . Number of children: 2  . Years of education: N/A   Occupational History  . [employer]    Social History Main Topics  . Smoking status: Current Every Day Smoker    Packs/day: 0.50    Types: Cigarettes  . Smokeless tobacco: Never Used  . Alcohol use No  . Drug use: Unknown  . Sexual activity: Not on file   Other Topics Concern  . Not on file   Social History Narrative  . No narrative on file      PHYSICAL EXAM  Blood pressure 99/61, pulse 88, resp. rate 18, SpO2 97 %.  General: Pleasant, NAD Psych: Normal affect. Neuro: Alert and oriented X 3. Moves all extremities spontaneously. HEENT: Sclera anicteric, noninjected. MMM.  Neck: JVP is not elevated. Lungs:  Clear with normal WOB Heart: RRR no m/r/g Abdomen: Soft, non-tender, non-distended, BS + x 4.  Extremities: No clubbing or cyanosis. Edema: none. DP/PT/Radials 2+ and equal bilaterally.   LABS and  STUDIES  Troponin (Point of Care Test) No results for input(s): TROPIPOC in the last 72 hours.  Recent Labs  07/16/16 1545  TROPONINI <0.03   Lab Results  Component Value Date   WBC 7.8 07/16/2016   HGB 13.4 07/16/2016   HCT 39.0 07/16/2016   MCV 86.5 07/16/2016   PLT 298 07/16/2016    Recent Labs Lab 07/16/16 1545  NA 138  K 4.4  CL 104  CO2 26  BUN 15  CREATININE 1.13*  CALCIUM 9.6  GLUCOSE 104*   No results found for: CHOL, HDL, LDLCALC, TRIG No results found for: DDIMER   Radiology/Studies. Independently Reviewed CXR with clear lung fields  ECG was independently reviewed. Sinus, HR 85 bpm, left atrial enlargement, 4mm Qwaves in aVF and 55mm in II  As well as lateral leads unchanged from prior. Large R wave in V1.  Echocardiogram 02/09/2015 - Left ventricle: The cavity size was normal. Wall thickness was   normal. Systolic function was normal. The estimated ejection   fraction was in the range  of 55% to 60%. Wall motion was normal;   there were no regional wall motion abnormalities. Doppler   parameters are consistent with abnormal left ventricular   relaxation (grade 1 diastolic dysfunction). The E/e&' ratio is <8,   suggesting normal LV filling pressure. - Left atrium: The atrium was normal in size.  Myocardial Perfusion Study 09/2014 Lateral infarct, mild reversible ischemia inferiorly  Left Heart Cath and Coronary Angiography 08/24/2015 1st Mrg lesion, 100% in stent restenosis. The lesion was previously treated with a stent (unknown type). The left ventricular systolic function is normal. No other obstructive disease.   ASSESSMENT and RECOMMENDATIONS   Karysa Hirschy is a 55 y.o. female  who has had coronary artery disease who presents to the ED with chest pain.  Her last myocardial perfusion study suggests that she has a large area of infarcted myocardium in her lateral wall, although there may be a small area of ischemia. This study was followed by cardiac cath less than one year go showing single vessel disease restricted to a chronically totally occluded obtuse marginal branch (in-stent). She has left-to-left collaterals supplying this area. She does have some occasional chest pain at baseline that is possibly anginal and treated with beta blocker and long-acting nitrates. It is unclear if her chest pain today in the ED is truly anginal - she has no ECG changes and troponin is negative. For now, I recommend close monitoring and medical management. It is unclear if she would benefit from coronary angiography, as she has no high-risk atherosclerotic lesions that would benefit from regascularization.   - Monitor on tele overnight - Trend troponin - Repeat ECG prn for chest pain - Hold on anticoagulation unless there is a worsening of chest pain, ischemic ECG changes, or troponin elevation - Continue home medications with the exception of increasing IMDUR to 60mg  daily - May need  to decrease Spironolactone if the patient has problems with low Bps - Will continue to follow and discuss the utility of repeat ischemic evaluation in the AM. If she has chest pain despite max antianginal therapy, the risks/benefits of CTO intervention could be discussed.  Odelle Kosier D, MD 07/16/2016, 8:25 PM

## 2016-07-16 NOTE — ED Notes (Signed)
Pt. Placed in Rm 35, placed in gown, placed on the monitor , BP cuff and pulse ox.  Pt. Reports that her lt. Side chest pain began 3 months ago and at present time 3/10.  Skin is warm and dry.  Pt. Is in NAD.  Call bell within reach.

## 2016-07-16 NOTE — ED Notes (Signed)
Pt stating that she wants to leave to go home and come back later. She was instructed on the process of leaving and coming back and informed of the risks as well as the procedure she would have to go through to be readmitted. Pt states that she must go to her car to retrieve her purse and charger for her phone. Call placed to security to escort patient to her vehicle to retrieve her belongings. Gae Bon EMT escorted pt to waiting room to meet with security.

## 2016-07-16 NOTE — ED Triage Notes (Signed)
The pt is c/o lt sided chest pain for a long time but this am she developed the pain and the pain lasted longer than usual.  She took one sl nitro that helped for awhile but she did not take another one

## 2016-07-16 NOTE — ED Provider Notes (Signed)
[facility] DEPT Provider Note   CSN: [id number] Arrival date & time: 07/16/16  1533     History   Chief Complaint Chief Complaint  Patient presents with  . Chest Pain    HPI Jocelyn [name] is a 55 y.o. female.  The history is provided by the patient. No language interpreter was used.  Chest Pain   This is a recurrent problem. The problem occurs daily. The pain is associated with rest and exertion (now present even at rest). The pain is present in the substernal region. The pain is moderate. Quality: achey. Associated symptoms include shortness of breath. Pertinent negatives include no abdominal pain, no fever, no nausea, no numbness, no vomiting and no weakness. Risk factors include obesity.  Procedure history is positive for cardiac catheterization (mrg 100% occluded).    Past Medical History:  Diagnosis Date  . Chronic diastolic CHF (congestive heart failure), NYHA class 2 ([place])   . HTN (hypertension)   . Hypercholesterolemia   . Hypothyroidism (acquired)   . Myocardial infarction   . Thyrotoxicosis   . TIA (transient ischemic attack)     Patient Active Problem List   Diagnosis Date Noted  . Thyroid disease   . Depression with anxiety 07/16/2016  . Chronic diastolic CHF (congestive heart failure) ([place]) 07/16/2016  . Aortic atherosclerosis ([place]) 11/24/2015  . Hypothyroidism (acquired)   . Unstable angina ([place]) 08/21/2015  . Chest pain   . Coronary artery disease   . CAD (coronary artery disease), native coronary artery 01/14/2015  . Hypertension 01/14/2015  . Hypercholesterolemia 01/14/2015  . Tobacco abuse 01/14/2015    Past Surgical History:  Procedure Laterality Date  . BREAST CYST EXCISION    . CARDIAC CATHETERIZATION N/A 08/24/2015   Procedure: Left Heart Cath and Coronary Angiography;  Surgeon: Jettie Booze, MD;  Location: [facility];  Service: Cardiovascular;  Laterality: N/A;  . CORONARY STENT PLACEMENT  1999  . KNEE SURGERY    .  partial hysterec    . THYROIDECTOMY      OB History    No data available       Home Medications    Prior to Admission medications   Medication Sig Start Date End Date Taking? Authorizing Provider  albuterol (PROVENTIL HFA;VENTOLIN HFA) 108 (90 Base) MCG/ACT inhaler Inhale 2 puffs into the lungs every 4 (four) hours as needed for shortness of breath.  06/21/15 07/16/16 Yes Historical Provider, MD  ALPRAZolam (XANAX) 1 MG tablet TAKE 1 TABLET BY MOUTH 3 TIMES A DAY AS NEEDED FOR ANXIETY OR PANIC ATTACKS 06/03/16  Yes Historical Provider, MD  aspirin 325 MG tablet Take 325 mg by mouth daily.   Yes Historical Provider, MD  cyclobenzaprine (FLEXERIL) 10 MG tablet Take 10 mg by mouth 3 (three) times daily as needed for muscle spasms.  06/30/15  Yes Historical Provider, MD  doxepin (SINEQUAN) 10 MG capsule Take 10-20 mg by mouth at bedtime as needed (for sleep).  01/02/15  Yes Historical Provider, MD  estradiol (ESTRACE) 0.5 MG tablet Take 0.5 mg by mouth daily. 01/05/15  Yes Historical Provider, MD  furosemide (LASIX) 40 MG tablet Take 40 mg by mouth daily.    Yes Historical Provider, MD  HYDROcodone-ibuprofen (VICOPROFEN) 7.5-200 MG tablet Take 1 tablet by mouth every 6 (six) hours as needed. 06/22/16  Yes Historical Provider, MD  isosorbide mononitrate (IMDUR) 30 MG 24 hr tablet Take 1 tablet (30 mg total) by mouth daily. 05/23/16  Yes Peter M Martinique, MD  KLOR-CON M10 10 MEQ tablet Take 10 mEq by mouth 2 (two) times daily. 07/06/15  Yes Historical Provider, MD  levothyroxine (SYNTHROID, LEVOTHROID) 50 MCG tablet Take 50 mcg by mouth daily. 10/27/15  Yes Historical Provider, MD  methimazole (TAPAZOLE) 5 MG tablet Take 2.5 mg by mouth daily. 05/29/16  Yes Historical Provider, MD  methocarbamol (ROBAXIN) 500 MG tablet TAKE 2 TABLETS BY MOUTH FOUR TIMES A DAY FOR 5 DAYS 04/29/16  Yes Historical Provider, MD  metoprolol tartrate (LOPRESSOR) 25 MG tablet Take 12.5 mg by mouth 2 (two) times daily. 01/03/15   Yes Historical Provider, MD  nitroGLYCERIN (NITROSTAT) 0.3 MG SL tablet TAKE 1 TABLET UNDER THE TONGUE EVERY 5 MINUTES AS NEEDED FOR CHEST PAIN 10/28/15  Yes Historical Provider, MD  promethazine-codeine (PHENERGAN WITH CODEINE) 6.25-10 MG/5ML syrup Take 5 mLs by mouth every 6 (six) hours as needed for cough.   Yes Historical Provider, MD  simvastatin (ZOCOR) 20 MG tablet Take 20 mg by mouth at bedtime. 01/03/15  Yes Historical Provider, MD  spironolactone (ALDACTONE) 25 MG tablet Take 25 mg by mouth 2 (two) times daily. 01/03/15  Yes Historical Provider, MD  sulindac (CLINORIL) 200 MG tablet Take 200 mg by mouth daily as needed (BACK PAIN).  10/29/15  Yes Historical Provider, MD  triamcinolone cream (KENALOG) 0.5 % Apply 1 application topically daily as needed (for sun exposure).   Yes Historical Provider, MD    Family History Family History  Problem Relation Age of Onset  . Heart disease Mother   . Stroke Mother   . Hypertension Mother   . Heart disease Father   . Cancer Father   . Stroke Brother   . Hypertension Maternal Grandmother   . Heart attack Neg Hx     Social History Social History  Substance Use Topics  . Smoking status: Current Every Day Smoker    Packs/day: 0.50    Types: Cigarettes  . Smokeless tobacco: Never Used  . Alcohol use No     Allergies   Patient has no known allergies.   Review of Systems Review of Systems  Constitutional: Negative for chills and fever.  HENT: Negative for congestion and rhinorrhea.   Respiratory: Positive for shortness of breath. Negative for wheezing.   Cardiovascular: Positive for chest pain. Negative for leg swelling.  Gastrointestinal: Negative for abdominal pain, nausea and vomiting.  Genitourinary: Negative for dysuria and hematuria.  Skin: Negative for pallor and rash.  Neurological: Negative for weakness and numbness.  Psychiatric/Behavioral: Negative for agitation and confusion.  All other systems reviewed and are  negative.    Physical Exam Updated Vital Signs BP (!) 93/49 (BP Location: Right Arm)   Pulse (!) 101   Temp 97.7 F (36.5 C) (Oral)   Resp 20   Ht 5\' 4"  (1.626 m)   Wt 98 kg   SpO2 100%   BMI 37.09 kg/m   Physical Exam  Constitutional: She is oriented to person, place, and time. She appears well-developed and well-nourished. No distress.  HENT:  Head: Normocephalic and atraumatic.  Eyes: Conjunctivae and EOM are normal.  Neck: Normal range of motion. Neck supple.  Cardiovascular: Normal rate and regular rhythm.   Pulmonary/Chest: Effort normal and breath sounds normal. No respiratory distress.  Abdominal: Soft. She exhibits no distension. There is no tenderness.  Musculoskeletal: Normal range of motion.  Neurological: She is alert and oriented to person, place, and time.  Skin: She is not diaphoretic.  Nursing note and vitals reviewed.  ED Treatments / Results  Labs (all labs ordered are listed, but only abnormal results are displayed) Labs Reviewed  BASIC METABOLIC PANEL - Abnormal; Notable for the following:       Result Value   Glucose, Bld 104 (*)    Creatinine, Ser 1.13 (*)    GFR calc non Af Amer 54 (*)    All other components within normal limits  TSH - Abnormal; Notable for the following:    TSH 0.195 (*)    All other components within normal limits  CBC  TROPONIN I  T4, FREE  TROPONIN I  TROPONIN I  BASIC METABOLIC PANEL  BRAIN NATRIURETIC PEPTIDE  LACTIC ACID, PLASMA    EKG  EKG Interpretation NSR, normal R wave progression. Rate = 84 bpm. Non-specific St-T wave changes in lateral leads. Does not appear to be changed when compared to 11/24/2015.        Radiology Dg Chest 2 View  Result Date: 07/16/2016 CLINICAL DATA:  Left chest pain radiating down the left arm to the hand since 5 a.m. today. Intermittent chest pains for the past 3 months. EXAM: CHEST  2 VIEW COMPARISON:  08/21/2015. FINDINGS: Normal sized heart. Clear lungs with normal  vascularity. Mild thoracic spine degenerative changes. IMPRESSION: No acute abnormality. Electronically Signed   By: Claudie Revering M.D.   On: 07/16/2016 16:27    Procedures Procedures (including critical care time)  Medications Ordered in ED Medications  isosorbide mononitrate (IMDUR) 24 hr tablet 60 mg (not administered)  aspirin tablet 325 mg (not administered)  promethazine-codeine (PHENERGAN with CODEINE) 6.25-10 MG/5ML syrup 5 mL (not administered)  ALPRAZolam (XANAX) tablet 1 mg (1 mg Oral Given 07/17/16 0020)  albuterol (PROVENTIL) (2.5 MG/3ML) 0.083% nebulizer solution 3 mL (not administered)  cyclobenzaprine (FLEXERIL) tablet 10 mg (10 mg Oral Given 07/17/16 0020)  doxepin (SINEQUAN) capsule 10-20 mg (not administered)  metoprolol tartrate (LOPRESSOR) tablet 12.5 mg (not administered)  simvastatin (ZOCOR) tablet 20 mg (20 mg Oral Given 07/17/16 0020)  nitroGLYCERIN (NITROSTAT) SL tablet 0.4 mg (not administered)  acetaminophen (TYLENOL) tablet 650 mg (not administered)  ondansetron (ZOFRAN) injection 4 mg (not administered)  enoxaparin (LOVENOX) injection 40 mg (40 mg Subcutaneous Given 07/17/16 0020)  sodium chloride flush (NS) 0.9 % injection 3 mL (3 mLs Intravenous Given 07/16/16 2307)  sodium chloride flush (NS) 0.9 % injection 3 mL (not administered)  0.9 %  sodium chloride infusion (not administered)  HYDROcodone-acetaminophen (NORCO/VICODIN) 5-325 MG per tablet 1-2 tablet (not administered)  0.9 %  sodium chloride infusion ( Intravenous New Bag/Given 07/17/16 0022)     Initial Impression / Assessment and Plan / ED Course  I have reviewed the triage vital signs and the nursing notes.  Pertinent labs & imaging results that were available during my care of the patient were reviewed by me and considered in my medical decision making (see chart for details).  Clinical Course     Patient is a 55 year old female who presents today with chest pain today and it has changed  compared to her baseline chest pain. Chest pain is now present even at rest. No acute changes appreciated on EKG when compared to prior. Her initial troponin is negative. Patient does have significant coronary artery occlusion noted on her recent cath in January 2017.  Discussed with cardiology who evaluated the patient and recommends admission to medicine for ACS rule out. Patient is agreeable with this. She was in stable condition at the time of admission.  Final Clinical  Impressions(s) / ED Diagnoses   Final diagnoses:  Unstable angina [facility])    New Prescriptions Current Discharge Medication List       Theodosia Quay, MD 07/17/16 [id number]    Carmin Muskrat, MD 07/21/16 [phone]

## 2016-07-16 NOTE — H&P (Signed)
History and Physical    Jocelyn [name] [id number] DOB: 06/12/1961 DOA: 07/16/2016  PCP: Benito Mccreedy, MD   Patient coming from: Home  Chief Complaint: Chest pain   HPI: Jocelyn [name] is a 55 y.o. female with medical history significant for thyroid disease, hypertension, coronary artery disease with chronic occlusion of first marginal due to in-stent restenosis, depression, and anxiety who presents to the emergency department with left-sided chest pain. Patient reports suffering from mild intermittent left-sided chest pain for the past 3 months or so, but today experienced pain which did not resolve, prompting her presentation for evaluation. She has history of CAD with remote stent to the first marginal, noted to be 100% occluded on coronary angiography in January of this year, but with collateral flow reported. Remaining coronaries were patent on that study and the patient has been medically managed. She reports recurrent intermittent left-sided chest pain lasting 15-30 minutes for the past 3 months, occurring in different scenarios, including sleep. She had similar discomfort today, described as a dull tightness at the left chest, moderate in intensity, and associated with a heavy sensation in the left arm, but no nausea, diaphoresis, or dyspnea. Patient reports that with her MI that prompted the stent placement remotely, she experienced severe dyspnea which has not been present with the recent episodes. Patient took one sublingual nitroglycerin at home with possible improvement in her symptoms. She takes a daily aspirin 325 mg. She denies recent fevers or chills, and denies dyspnea, but notes a recent cough which has been slowly improving after a recent URI.  ED Course: Upon arrival to the ED, patient is found to be saturating well on room air, hypotensive to 88/42, and with vitals otherwise stable. EKG features a sinus rhythm with inferior Q waves and no significant change relative to priors.  Chest x-ray is negative for acute cardiopulmonary disease. She panels notable for serum creatinine 1.13, up from 1.0 in January of this year. CBC is unremarkable and serum troponin is undetectable. Blood pressure spontaneously improved and stabilized in the 110/80 range. Cardiology was consulted by the ED physician who evaluated the patient in the emergency department. Patient has remained hemodynamically stable in the emergency department and is currently free of chest pain. She will be observed on the telemetry unit for ongoing evaluation and management of chest pain with both typical and atypical features, and with a history significant for known coronary artery disease.  Review of Systems:  All other systems reviewed and apart from HPI, are negative.  Past Medical History:  Diagnosis Date  . Chronic diastolic CHF (congestive heart failure), NYHA class 2 ([place])   . HTN (hypertension)   . Hypercholesterolemia   . Hypothyroidism (acquired)   . Myocardial infarction   . Thyrotoxicosis   . TIA (transient ischemic attack)     Past Surgical History:  Procedure Laterality Date  . BREAST CYST EXCISION    . CARDIAC CATHETERIZATION N/A 08/24/2015   Procedure: Left Heart Cath and Coronary Angiography;  Surgeon: Jettie Booze, MD;  Location: [facility];  Service: Cardiovascular;  Laterality: N/A;  . CORONARY STENT PLACEMENT  1999  . KNEE SURGERY    . partial hysterec    . THYROIDECTOMY       reports that she has been smoking Cigarettes.  She has been smoking about 0.50 packs per day. She has never used smokeless tobacco. She reports that she does not drink alcohol. Her drug history is not on file.  No Known Allergies  Family History  Problem Relation Age of Onset  . Heart disease Mother   . Stroke Mother   . Hypertension Mother   . Heart disease Father   . Cancer Father   . Stroke Brother   . Hypertension Maternal Grandmother   . Heart attack Neg Hx      Prior to  Admission medications   Medication Sig Start Date End Date Taking? Authorizing Provider  albuterol (PROVENTIL HFA;VENTOLIN HFA) 108 (90 Base) MCG/ACT inhaler Inhale 2 puffs into the lungs every 4 (four) hours as needed for shortness of breath.  06/21/15 07/16/16 Yes Historical Provider, MD  ALPRAZolam (XANAX) 1 MG tablet TAKE 1 TABLET BY MOUTH 3 TIMES A DAY AS NEEDED FOR ANXIETY OR PANIC ATTACKS 06/03/16  Yes Historical Provider, MD  aspirin 325 MG tablet Take 325 mg by mouth daily.   Yes Historical Provider, MD  cyclobenzaprine (FLEXERIL) 10 MG tablet Take 10 mg by mouth 3 (three) times daily as needed for muscle spasms.  06/30/15  Yes Historical Provider, MD  doxepin (SINEQUAN) 10 MG capsule Take 10-20 mg by mouth at bedtime as needed (for sleep).  01/02/15  Yes Historical Provider, MD  estradiol (ESTRACE) 0.5 MG tablet Take 0.5 mg by mouth daily. 01/05/15  Yes Historical Provider, MD  furosemide (LASIX) 40 MG tablet Take 40 mg by mouth daily.    Yes Historical Provider, MD  HYDROcodone-ibuprofen (VICOPROFEN) 7.5-200 MG tablet Take 1 tablet by mouth every 6 (six) hours as needed. 06/22/16  Yes Historical Provider, MD  isosorbide mononitrate (IMDUR) 30 MG 24 hr tablet Take 1 tablet (30 mg total) by mouth daily. 05/23/16  Yes Peter M Martinique, MD  KLOR-CON M10 10 MEQ tablet Take 10 mEq by mouth 2 (two) times daily. 07/06/15  Yes Historical Provider, MD  levothyroxine (SYNTHROID, LEVOTHROID) 50 MCG tablet Take 50 mcg by mouth daily. 10/27/15  Yes Historical Provider, MD  methimazole (TAPAZOLE) 5 MG tablet Take 2.5 mg by mouth daily. 05/29/16  Yes Historical Provider, MD  methocarbamol (ROBAXIN) 500 MG tablet TAKE 2 TABLETS BY MOUTH FOUR TIMES A DAY FOR 5 DAYS 04/29/16  Yes Historical Provider, MD  metoprolol tartrate (LOPRESSOR) 25 MG tablet Take 12.5 mg by mouth 2 (two) times daily. 01/03/15  Yes Historical Provider, MD  nitroGLYCERIN (NITROSTAT) 0.3 MG SL tablet TAKE 1 TABLET UNDER THE TONGUE EVERY 5 MINUTES AS  NEEDED FOR CHEST PAIN 10/28/15  Yes Historical Provider, MD  promethazine-codeine (PHENERGAN WITH CODEINE) 6.25-10 MG/5ML syrup Take 5 mLs by mouth every 6 (six) hours as needed for cough.   Yes Historical Provider, MD  simvastatin (ZOCOR) 20 MG tablet Take 20 mg by mouth at bedtime. 01/03/15  Yes Historical Provider, MD  spironolactone (ALDACTONE) 25 MG tablet Take 25 mg by mouth 2 (two) times daily. 01/03/15  Yes Historical Provider, MD  sulindac (CLINORIL) 200 MG tablet Take 200 mg by mouth daily as needed (BACK PAIN).  10/29/15  Yes Historical Provider, MD  triamcinolone cream (KENALOG) 0.5 % Apply 1 application topically daily as needed (for sun exposure).   Yes Historical Provider, MD    Physical Exam: Vitals:   07/16/16 2015 07/16/16 2030 07/16/16 2045 07/16/16 2115  BP: 105/85 110/86 101/74 108/79  Pulse: 83 87 90 89  Resp: 21 25 (!) 28 17  SpO2: 98% 98% 96% 99%      Constitutional: NAD, calm, comfortable Eyes: PERTLA, lids and conjunctivae normal ENMT: Mucous membranes are moist. Posterior pharynx clear of any exudate or lesions.  Neck: normal, supple, no masses, no thyromegaly Respiratory: clear to auscultation bilaterally, no wheezing, no crackles. Normal respiratory effort.   Cardiovascular: S1 & S2 heard, regular rate and rhythm, no significant murmur. No significant JVD. Abdomen: No distension, no tenderness, no masses palpated. Bowel sounds normal.  Musculoskeletal: no clubbing / cyanosis. No joint deformity upper and lower extremities. Normal muscle tone.  Skin: no significant rashes, lesions, ulcers. Warm, dry, well-perfused. Neurologic: CN 2-12 grossly intact. Sensation intact, DTR normal. Strength 5/5 in all 4 limbs.  Psychiatric: Normal judgment and insight. Alert and oriented x 3. Normal mood and affect.     Labs on Admission: I have personally reviewed following labs and imaging studies  CBC:  Recent Labs Lab 07/16/16 1545  WBC 7.8  HGB 13.4  HCT 39.0  MCV  86.5  PLT Q000111Q   Basic Metabolic Panel:  Recent Labs Lab 07/16/16 1545  NA 138  K 4.4  CL 104  CO2 26  GLUCOSE 104*  BUN 15  CREATININE 1.13*  CALCIUM 9.6   GFR: CrCl cannot be calculated (Unknown ideal weight.). Liver Function Tests: No results for input(s): AST, ALT, ALKPHOS, BILITOT, PROT, ALBUMIN in the last 168 hours. No results for input(s): LIPASE, AMYLASE in the last 168 hours. No results for input(s): AMMONIA in the last 168 hours. Coagulation Profile: No results for input(s): INR, PROTIME in the last 168 hours. Cardiac Enzymes:  Recent Labs Lab 07/16/16 1545  TROPONINI <0.03   BNP (last 3 results) No results for input(s): PROBNP in the last 8760 hours. HbA1C: No results for input(s): HGBA1C in the last 72 hours. CBG: No results for input(s): GLUCAP in the last 168 hours. Lipid Profile: No results for input(s): CHOL, HDL, LDLCALC, TRIG, CHOLHDL, LDLDIRECT in the last 72 hours. Thyroid Function Tests: No results for input(s): TSH, T4TOTAL, FREET4, T3FREE, THYROIDAB in the last 72 hours. Anemia Panel: No results for input(s): VITAMINB12, FOLATE, FERRITIN, TIBC, IRON, RETICCTPCT in the last 72 hours. Urine analysis: No results found for: COLORURINE, APPEARANCEUR, LABSPEC, PHURINE, GLUCOSEU, HGBUR, BILIRUBINUR, KETONESUR, PROTEINUR, UROBILINOGEN, NITRITE, LEUKOCYTESUR Sepsis Labs: @LABRCNTIP (procalcitonin:4,lacticidven:4) )No results found for this or any previous visit (from the past 240 hour(s)).   Radiological Exams on Admission: Dg Chest 2 View  Result Date: 07/16/2016 CLINICAL DATA:  Left chest pain radiating down the left arm to the hand since 5 a.m. today. Intermittent chest pains for the past 3 months. EXAM: CHEST  2 VIEW COMPARISON:  08/21/2015. FINDINGS: Normal sized heart. Clear lungs with normal vascularity. Mild thoracic spine degenerative changes. IMPRESSION: No acute abnormality. Electronically Signed   By: Claudie Revering M.D.   On: 07/16/2016  16:27    EKG: Independently reviewed. Sinus rhythm, inferior Q-wave, no significant change from prior  Assessment/Plan  1. Chest pain, coronary artery disease  - Pt presents with intermittent chest pains for past 3 mos, but lasting longer on day of admission  - She has known 100% occlusion of 1st marginal d/t in-stent restenosis, but remaining vessels patent on angiography in January 2017  - She is medically-managed with daily ASA 325, Lopressor, and Imdur at home - Initial EKG with no acute ischemic features or significant change from prior; initial troponin undetectable  - Cardiology is consulting and much appreciated; initial recommendations include increasing Imdur to 60 mg qD, monitoring on telemetry, and obtaining serial troponin measurements  - Per cardiology recs, plan to hold-off on anticoagulation unless CP worsens, troponin becomes elevated, or EKG demonstrates ischemia  - Cardiology plans  to evaluate in am regarding ?further ischemic eval    2. Hypotension, hx of hypertension  - BP as low as 88/42 in ED, initially improved before trending back down once up on the floor  - There is no fever or leukocytosis to suggest infectious process; she is being evaluated for potential ACS as above  - Plan to check BNP and lactate and start a gentle IVF hydration  - She is managed with Lopressor, Lasix, and Aldactone at home; the diuretics are held on admission and Lopressor continued with holding parameters    3. Thyroid disease  - Pt was diagnosed with post-surgical hypothyroidism and had been taking Synthroid, but was evaluated by endocrinology in September 2017 for very low TSH, diagnosed with thyrotoxicosis  - Endocrinology advised stopping Synthroid in September 2017 and starting a low-dose methimazole; patient has been taking both Synthroid and methimazole  - Plan to check thyroid studies and hold both medications for now until current status can be clarified    4. Chronic diastolic  CHF  - Pt appears roughly euvolemic on admission, possibly dry  - TTE (02/09/15) with EF 0000000, grade 1 diastolic dysfunction, no WMA or significant valvular disease noted  - She is managed with Lasix, Aldactone, and Lopressor at home; diuretics held on admission and gentle IVF hydration provided in setting of low BPs  - Follow daily wts and I/Os, resume diuretics as appropriate   5. Depression, anxiety  - Pt reports that this is stable  - Continue Sinequan and prn Xanax    DVT prophylaxis: sq Lovenox  Code Status: Full  Family Communication: Sister updated at bedside at pt's request Disposition Plan: Observe on telemetry Consults called: Cardiology Admission status: Observation    Vianne Bulls, MD [facility] Pager [phone]  If 7PM-7AM, please contact night-coverage [email or web address] Password [email or web address]  07/16/2016, 10:19 PM

## 2016-07-17 DIAGNOSIS — I5032 Chronic diastolic (congestive) heart failure: Secondary | ICD-10-CM

## 2016-07-17 DIAGNOSIS — T82855D Stenosis of coronary artery stent, subsequent encounter: Secondary | ICD-10-CM | POA: Diagnosis not present

## 2016-07-17 DIAGNOSIS — E079 Disorder of thyroid, unspecified: Secondary | ICD-10-CM

## 2016-07-17 DIAGNOSIS — I25119 Atherosclerotic heart disease of native coronary artery with unspecified angina pectoris: Secondary | ICD-10-CM

## 2016-07-17 DIAGNOSIS — I10 Essential (primary) hypertension: Secondary | ICD-10-CM

## 2016-07-17 DIAGNOSIS — R079 Chest pain, unspecified: Secondary | ICD-10-CM

## 2016-07-17 DIAGNOSIS — F418 Other specified anxiety disorders: Secondary | ICD-10-CM | POA: Diagnosis not present

## 2016-07-17 DIAGNOSIS — R0789 Other chest pain: Secondary | ICD-10-CM | POA: Diagnosis not present

## 2016-07-17 DIAGNOSIS — E039 Hypothyroidism, unspecified: Secondary | ICD-10-CM

## 2016-07-17 DIAGNOSIS — I2510 Atherosclerotic heart disease of native coronary artery without angina pectoris: Secondary | ICD-10-CM | POA: Diagnosis not present

## 2016-07-17 DIAGNOSIS — I2582 Chronic total occlusion of coronary artery: Secondary | ICD-10-CM | POA: Diagnosis not present

## 2016-07-17 LAB — BASIC METABOLIC PANEL
Anion gap: 11 (ref 5–15)
BUN: 14 mg/dL (ref 6–20)
CALCIUM: 9.3 mg/dL (ref 8.9–10.3)
CO2: 22 mmol/L (ref 22–32)
CREATININE: 1.02 mg/dL — AB (ref 0.44–1.00)
Chloride: 104 mmol/L (ref 101–111)
Glucose, Bld: 115 mg/dL — ABNORMAL HIGH (ref 65–99)
Potassium: 4.3 mmol/L (ref 3.5–5.1)
SODIUM: 137 mmol/L (ref 135–145)

## 2016-07-17 LAB — TROPONIN I

## 2016-07-17 LAB — LACTIC ACID, PLASMA: Lactic Acid, Venous: 1.8 mmol/L (ref 0.5–1.9)

## 2016-07-17 LAB — BRAIN NATRIURETIC PEPTIDE: B NATRIURETIC PEPTIDE 5: 53.4 pg/mL (ref 0.0–100.0)

## 2016-07-17 LAB — T4, FREE: FREE T4: 0.93 ng/dL (ref 0.61–1.12)

## 2016-07-17 LAB — TSH: TSH: 0.195 u[IU]/mL — AB (ref 0.350–4.500)

## 2016-07-17 MED ORDER — FAMOTIDINE 20 MG PO TABS: 20.0000 mg | ORAL_TABLET | Freq: Two times a day (BID) | ORAL | 0 refills | Status: DC

## 2016-07-17 MED ORDER — SODIUM CHLORIDE 0.9 % IV SOLN
INTRAVENOUS | Status: AC
  Administered 2016-07-17: via INTRAVENOUS

## 2016-07-17 MED ORDER — METOPROLOL TARTRATE 25 MG PO TABS: 25.0000 mg | ORAL_TABLET | Freq: Two times a day (BID) | ORAL | 2 refills | Status: DC

## 2016-07-17 MED ORDER — METOPROLOL TARTRATE 25 MG PO TABS
25.0000 mg | ORAL_TABLET | Freq: Two times a day (BID) | ORAL | Status: DC
  Administered 2016-07-17: 25 mg via ORAL
  Filled 2016-07-17: qty 1

## 2016-07-17 MED ORDER — ISOSORBIDE MONONITRATE ER 30 MG PO TB24: 60.0000 mg | ORAL_TABLET | Freq: Every day | ORAL | 5 refills | Status: DC

## 2016-07-17 NOTE — Progress Notes (Signed)
Patient ID: Jocelyn [name], female   DOB: [birth year], 55 y.o.   MRN: [id number]   Patient Name: Jocelyn [name] Date of Encounter: 07/17/2016  Primary Cardiologist: Martinique  Hospital Problem List     Principal Problem:   Chest pain Active Problems:   CAD (coronary artery disease), native coronary artery   Hypertension   Hypothyroidism (acquired)   Depression with anxiety   Chronic diastolic CHF (congestive heart failure) (H[facility])   Thyroid disease     Subjective   No pain this am wants to go home   Inpatient Medications    Scheduled Meds: . aspirin  325 mg Oral Daily  . enoxaparin (LOVENOX) injection  40 mg Subcutaneous QHS  . isosorbide mononitrate  60 mg Oral Daily  . metoprolol tartrate  12.5 mg Oral BID  . simvastatin  20 mg Oral QHS  . sodium chloride flush  3 mL Intravenous Q12H   Continuous Infusions: . sodium chloride 100 mL/hr at 07/17/16 0022   PRN Meds: sodium chloride, acetaminophen, albuterol, ALPRAZolam, cyclobenzaprine, doxepin, HYDROcodone-acetaminophen, nitroGLYCERIN, ondansetron (ZOFRAN) IV, promethazine-codeine, sodium chloride flush   Vital Signs    Vitals:   07/16/16 2307 07/17/16 0101 07/17/16 0113 07/17/16 0452  BP: (!) 93/49 117/66 108/64 (!) 123/57  Pulse: (!) 101 93 94 98  Resp: 20 20  20   Temp: 97.7 F (36.5 C)   98.5 F (36.9 C)  TempSrc: Oral   Oral  SpO2: 100% 98%  98%  Weight: 216 lb 1.6 oz (98 kg)   215 lb (97.5 kg)  Height: 5\' 4"  (1.626 m)      No intake or output data in the 24 hours ending 07/17/16 0759 Filed Weights   07/16/16 2307 07/17/16 0452  Weight: 216 lb 1.6 oz (98 kg) 215 lb (97.5 kg)    Physical Exam    GEN: Well nourished, well developed, in no acute distress.  HEENT: Grossly normal.  Neck: Supple, no JVD, carotid bruits, or masses. Cardiac: RRR, no murmurs, rubs, or gallops. No clubbing, cyanosis, edema.  Radials/DP/PT 2+ and equal bilaterally.  Respiratory:  Respirations regular and unlabored, clear to  auscultation bilaterally. GI: Soft, nontender, nondistended, BS + x 4. MS: no deformity or atrophy. Skin: warm and dry, no rash. Neuro:  Strength and sensation are intact. Psych: AAOx3.  Normal affect.  Labs    CBC  Recent Labs  07/16/16 1545  WBC 7.8  HGB 13.4  HCT 39.0  MCV 86.5  PLT Q000111Q   Basic Metabolic Panel  Recent Labs  07/16/16 1545 07/17/16 0419  NA 138 137  K 4.4 4.3  CL 104 104  CO2 26 22  GLUCOSE 104* 115*  BUN 15 14  CREATININE 1.13* 1.02*  CALCIUM 9.6 9.3   Liver Function Tests No results for input(s): AST, ALT, ALKPHOS, BILITOT, PROT, ALBUMIN in the last 72 hours. No results for input(s): LIPASE, AMYLASE in the last 72 hours. Cardiac Enzymes  Recent Labs  07/16/16 2313 07/17/16 0142 07/17/16 0419  TROPONINI <0.03 <0.03 <0.03   Thyroid Function Tests  Recent Labs  07/16/16 2313  TSH 0.195*    Telemetry    NSR 07/17/2016  - Personally Reviewed  ECG    NSR no acute ST changes  - Personally Reviewed  Radiology    Dg Chest 2 View  Result Date: 07/16/2016 CLINICAL DATA:  Left chest pain radiating down the left arm to the hand since 5 a.m. today. Intermittent chest pains for the past 3 months. EXAM: CHEST  2 VIEW COMPARISON:  08/21/2015. FINDINGS: Normal sized heart. Clear lungs with normal vascularity. Mild thoracic spine degenerative changes. IMPRESSION: No acute abnormality. Electronically Signed   By: Claudie Revering M.D.   On: 07/16/2016 16:27    Cardiac Studies   Reviewed cath from 08/24/15 occluded OM with collaterals   Patient Profile     55 y.o. with atypical chest pain history of occluded stent to OM  Assessment & Plan    Chest Pain: R/O no ECG changes pain last night related to food. Continue nitrates Increase beta blocker OUtpatient f/u with Dr Martinique will be arranged Given occluded Stent myovue with mostly infarct and small Om doubt he would consider CTO of vessel Can consider ranexa if pain continues  Thyroid:   TSH suppressed ? Further w/u  Chol on statin   Ok to [employer] home   Signed, Jenkins Rouge, MD  07/17/2016, 7:59 AM

## 2016-07-17 NOTE — Discharge Summary (Signed)
Physician Discharge Summary  Jocelyn [name] [id number] DOB: 02/24/1961 DOA: 07/16/2016  PCP: Benito Mccreedy, MD  Admit date: 07/16/2016 Discharge date: 07/17/2016  Admitted From: home  Disposition:  home   Recommendations for Outpatient Follow-up:  1. F/u on chest pain 2. F/u on concomitant use of Synthroid and Methimazole    Discharge Condition:  stable   CODE STATUS:  Full code   Diet recommendation:  Heart healthy, low sodium, bland diet   Consultations:  cardiology    Discharge Diagnoses:  Principal Problem:   Chest pain Active Problems:   CAD (coronary artery disease), native coronary artery   Hypertension   Hypothyroidism (acquired)   Depression with anxiety   Chronic diastolic CHF (congestive heart failure) (HCC)   Thyroid disease    Subjective: Chest pain resolved for now.   HPI: Jocelyn [name] is a 55 y.o. female with medical history significant for thyroid disease, hypertension, coronary artery disease with chronic occlusion of first marginal due to in-stent restenosis, depression, and anxiety who presents to the emergency department with left-sided chest pain. Patient reports suffering from mild intermittent left-sided chest pain for the past 3 months or so, but today experienced pain which did not resolve, prompting her presentation for evaluation. She has history of CAD with remote stent to the first marginal, noted to be 100% occluded on coronary angiography in January of this year, but with collateral flow reported. Remaining coronaries were patent on that study and the patient has been medically managed. She reports recurrent intermittent left-sided chest pain lasting 15-30 minutes for the past 3 months, occurring in different scenarios, including sleep. She had similar discomfort today, described as a dull tightness at the left chest, moderate in intensity, and associated with a heavy sensation in the left arm, but no nausea, diaphoresis, or dyspnea.  Patient reports that with her MI that prompted the stent placement remotely, she experienced severe dyspnea which has not been present with the recent episodes. Patient took one sublingual nitroglycerin at home with possible improvement in her symptoms. She takes a daily aspirin 325 mg. She denies recent fevers or chills, and denies dyspnea, but notes a recent cough which has been slowly improving after a recent URI.  Hospital Course:  Chest pain - left heart cath in 1/17 as above - cardiac vs GI- she is on NSAIDs at home- have added Pepcid, discussed dietary and medication changes (PRN instead of routine) - cardiology has increase Metoprolol and Imdur and recommends outpt f/u with with Dr [place] which they will arranged  Hypotension, hx of hypertension  - BP as low as 88/42 in ED, initially improved before trending back down once up on the floor  -  This has resolved and may have been a result of Nitro (?)  Thyroid disease  - Pt was diagnosed with post-surgical hypothyroidism and had been taking Synthroid, but was evaluated by endocrinology in September 2017 for very low TSH, diagnosed with thyrotoxicosis  - Endocrinology advised stopping Synthroid in September 2017 and starting a low-dose methimazole; patient has been taking both Synthroid and methimazole  - advised her to speak with her endocrinologist- as this is a weeknend, I am not able to call   Chronic diastolic CHF   - compensated -  TTE (02/09/15) with EF 0000000, grade 1 diastolic dysfunction, no WMA or significant valvular disease noted  - She is managed with Lasix, Aldactone, and Lopressor at home     Depression, anxiety  - Pt reports that this is stable  -  Continue Sinequan and prn Xanax    Discharge Instructions  Discharge Instructions    Diet - low sodium heart healthy    Complete by:  As directed    Increase activity slowly    Complete by:  As directed      Allergies as of 07/17/2016   No Known Allergies      Medication List    STOP taking these medications   methocarbamol 500 MG tablet Commonly known as:  ROBAXIN     TAKE these medications   albuterol 108 (90 Base) MCG/ACT inhaler Commonly known as:  PROVENTIL HFA;VENTOLIN HFA Inhale 2 puffs into the lungs every 4 (four) hours as needed for shortness of breath.   ALPRAZolam 1 MG tablet Commonly known as:  XANAX TAKE 1 TABLET BY MOUTH 3 TIMES A DAY AS NEEDED FOR ANXIETY OR PANIC ATTACKS   aspirin 325 MG tablet Take 325 mg by mouth daily.   cyclobenzaprine 10 MG tablet Commonly known as:  FLEXERIL Take 10 mg by mouth 3 (three) times daily as needed for muscle spasms.   doxepin 10 MG capsule Commonly known as:  SINEQUAN Take 10-20 mg by mouth at bedtime as needed (for sleep).   estradiol 0.5 MG tablet Commonly known as:  ESTRACE Take 0.5 mg by mouth daily.   famotidine 20 MG tablet Commonly known as:  PEPCID Take 1 tablet (20 mg total) by mouth 2 (two) times daily.   furosemide 40 MG tablet Commonly known as:  LASIX Take 40 mg by mouth daily.   HYDROcodone-ibuprofen 7.5-200 MG tablet Commonly known as:  VICOPROFEN Take 1 tablet by mouth every 6 (six) hours as needed.   isosorbide mononitrate 30 MG 24 hr tablet Commonly known as:  IMDUR Take 2 tablets (60 mg total) by mouth daily. What changed:  how much to take   KLOR-CON M10 10 MEQ tablet Generic drug:  potassium chloride Take 10 mEq by mouth 2 (two) times daily.   levothyroxine 50 MCG tablet Commonly known as:  SYNTHROID, LEVOTHROID Take 50 mcg by mouth daily.   methimazole 5 MG tablet Commonly known as:  TAPAZOLE Take 2.5 mg by mouth daily.   metoprolol tartrate 25 MG tablet Commonly known as:  LOPRESSOR Take 1 tablet (25 mg total) by mouth 2 (two) times daily. What changed:  how much to take   nitroGLYCERIN 0.3 MG SL tablet Commonly known as:  NITROSTAT TAKE 1 TABLET UNDER THE TONGUE EVERY 5 MINUTES AS NEEDED FOR CHEST PAIN   promethazine-codeine  6.25-10 MG/5ML syrup Commonly known as:  PHENERGAN with CODEINE Take 5 mLs by mouth every 6 (six) hours as needed for cough.   simvastatin 20 MG tablet Commonly known as:  ZOCOR Take 20 mg by mouth at bedtime.   spironolactone 25 MG tablet Commonly known as:  ALDACTONE Take 25 mg by mouth 2 (two) times daily.   sulindac 200 MG tablet Commonly known as:  CLINORIL Take 200 mg by mouth daily as needed (BACK PAIN).   triamcinolone cream 0.5 % Commonly known as:  KENALOG Apply 1 application topically daily as needed (for sun exposure).       No Known Allergies   Procedures/Studies:   Dg Chest 2 View  Result Date: 07/16/2016 CLINICAL DATA:  Left chest pain radiating down the left arm to the hand since 5 a.m. today. Intermittent chest pains for the past 3 months. EXAM: CHEST  2 VIEW COMPARISON:  08/21/2015. FINDINGS: Normal sized heart. Clear lungs with normal  vascularity. Mild thoracic spine degenerative changes. IMPRESSION: No acute abnormality. Electronically Signed   By: Claudie Revering M.D.   On: 07/16/2016 16:27   Mr Lumbar Spine W/o Contrast  Result Date: 07/11/2016 CLINICAL DATA:  55 year old female with lumbar back pain radiating to both hips for 2 years after twisting injury. Right buttock pain. Subsequent encounter. EXAM: MRI LUMBAR SPINE WITHOUT CONTRAST TECHNIQUE: Multiplanar, multisequence MR imaging of the lumbar spine was performed. No intravenous contrast was administered. COMPARISON:  Lumbar spine radiographs 06/29/2016. CT Abdomen 11/19/2015 FINDINGS: Segmentation:  Normal as seen on the comparison radiographs. Alignment: Preserved lordosis. Vertebral height and alignment within normal limits. Vertebrae: Mild marrow edema in the right L5 superior articulating facet (series 3, image 3), superimposed on chronic facet degeneration detailed below. No other No acute osseous abnormality identified. Visible sacrum and SI joints appear normal. Conus medullaris: Extends to the  L1-L2 level and appears normal. Paraspinal and other soft tissues: Large body habitus. Otherwise negative. Disc levels: T11-T12: Anterior eccentric disc bulge. Up to moderate posterior element hypertrophy. No definite spinal stenosis, but moderate right T11 foraminal stenosis. T12-L1: Anterior eccentric disc bulge. Mild to moderate posterior element hypertrophy. Mild right T12 foraminal stenosis. L1-L2: Mild to moderate facet hypertrophy greater on the left. Mild disc desiccation. No stenosis. L2-L3:  Mild facet hypertrophy. L3-L4:  Mild facet hypertrophy greater on the left. L4-L5: Moderate to severe facet hypertrophy. Up to mild bilateral L4 foraminal stenosis. Mild epidural lipomatosis. No significant spinal or lateral recess stenosis. L5-S1: Moderate to severe facet hypertrophy greater on the left. Trace right facet joint fluid. Mild far lateral endplate spurring. No stenosis. IMPRESSION: 1. Dominant degenerative finding in the lumbar spine is facet arthropathy which is moderate to severe at L4-L5 and L5-S1. Favor facet disease is the source of the pain symptoms. Superimposed mild edema in the superior right L5 facet compatible with acute exacerbation of the chronic facet joint arthritis. 2. No lumbar disc herniation. No lumbar spinal or lateral recess stenosis despite mild epidural lipomatosis. There is mild bilateral L4 neural foraminal stenosis. 3. Lower thoracic disc, endplate, and posterior element degeneration. Moderate right T11, and mild right T12 neural foraminal stenosis. Electronically Signed   By: Genevie Ann M.D.   On: 07/11/2016 07:58   Xr Lumbar Spine 2-3 Views  Result Date: 06/29/2016 AP and lateral lumbar x-rays are obtained and reviewed with the patient. This shows normal bony architecture no disc space narrowing normal lumbar curvature. Mild facet sclerosis at L4-5. No anterolisthesis. Impression: Normal lumbar x-rays other than the slight facet degenerative changes       Discharge  Exam: Vitals:   07/17/16 0113 07/17/16 0452  BP: 108/64 (!) 123/57  Pulse: 94 98  Resp:  20  Temp:  98.5 F (36.9 C)   Vitals:   07/16/16 2307 07/17/16 0101 07/17/16 0113 07/17/16 0452  BP: (!) 93/49 117/66 108/64 (!) 123/57  Pulse: (!) 101 93 94 98  Resp: 20 20  20   Temp: 97.7 F (36.5 C)   98.5 F (36.9 C)  TempSrc: Oral   Oral  SpO2: 100% 98%  98%  Weight: 98 kg (216 lb 1.6 oz)   97.5 kg (215 lb)  Height: 5\' 4"  (1.626 m)       General: Pt is alert, awake, not in acute distress Cardiovascular: RRR, S1/S2 +, no rubs, no gallops Respiratory: CTA bilaterally, no wheezing, no rhonchi Abdominal: Soft, NT, ND, bowel sounds + Extremities: no edema, no cyanosis    The  results of significant diagnostics from this hospitalization (including imaging, microbiology, ancillary and laboratory) are listed below for reference.     Microbiology: No results found for this or any previous visit (from the past 240 hour(s)).   Labs: BNP (last 3 results)  Recent Labs  07/17/16 0020  BNP [id number]   Basic Metabolic Panel:  Recent Labs Lab 07/16/16 1545 07/17/16 0419  NA 138 137  K 4.4 4.3  CL 104 104  CO2 26 22  GLUCOSE 104* 115*  BUN 15 14  CREATININE 1.13* 1.02*  CALCIUM 9.6 9.3   Liver Function Tests: No results for input(s): AST, ALT, ALKPHOS, BILITOT, PROT, ALBUMIN in the last 168 hours. No results for input(s): LIPASE, AMYLASE in the last 168 hours. No results for input(s): AMMONIA in the last 168 hours. CBC:  Recent Labs Lab 07/16/16 1545  WBC 7.8  HGB 13.4  HCT 39.0  MCV 86.5  PLT 298   Cardiac Enzymes:  Recent Labs Lab 07/16/16 1545 07/16/16 2313 07/17/16 0142 07/17/16 0419  TROPONINI <0.03 <0.03 <0.03 <0.03   BNP: Invalid input(s): POCBNP CBG: No results for input(s): GLUCAP in the last 168 hours. D-Dimer No results for input(s): DDIMER in the last 72 hours. Hgb A1c No results for input(s): HGBA1C in the last 72 hours. Lipid Profile No  results for input(s): CHOL, HDL, LDLCALC, TRIG, CHOLHDL, LDLDIRECT in the last 72 hours. Thyroid function studies  Recent Labs  07/16/16 2313  TSH 0.195*   Anemia work up No results for input(s): VITAMINB12, FOLATE, FERRITIN, TIBC, IRON, RETICCTPCT in the last 72 hours. Urinalysis No results found for: COLORURINE, APPEARANCEUR, LABSPEC, Filer, GLUCOSEU, HGBUR, BILIRUBINUR, KETONESUR, PROTEINUR, UROBILINOGEN, NITRITE, LEUKOCYTESUR Sepsis Labs Invalid input(s): PROCALCITONIN,  WBC,  LACTICIDVEN Microbiology No results found for this or any previous visit (from the past 240 hour(s)).   Time coordinating discharge: Over 30 minutes  SIGNED:   [name], MD  [facility] 07/17/2016, 10:37 AM Pager   If 7PM-7AM, please contact night-coverage [email or web address] Password [email or web address]

## 2016-07-22 ENCOUNTER — Ambulatory Visit (INDEPENDENT_AMBULATORY_CARE_PROVIDER_SITE_OTHER): Payer: Medicaid Other | Admitting: Orthopaedic Surgery

## 2016-07-22 ENCOUNTER — Encounter (INDEPENDENT_AMBULATORY_CARE_PROVIDER_SITE_OTHER): Payer: Self-pay | Admitting: Orthopaedic Surgery

## 2016-07-22 VITALS — BP 104/84 | HR 106

## 2016-07-22 DIAGNOSIS — M5441 Lumbago with sciatica, right side: Secondary | ICD-10-CM | POA: Diagnosis not present

## 2016-07-22 DIAGNOSIS — M5442 Lumbago with sciatica, left side: Secondary | ICD-10-CM | POA: Diagnosis not present

## 2016-07-22 NOTE — Progress Notes (Signed)
Office Visit Note   Patient: Jocelyn [name]           Date of Birth: 06/15/1961           MRN: [id number] Visit Date: 07/22/2016              Requested by: Benito Mccreedy, MD [id number] [place], [place] [address] PCP: Benito Mccreedy, MD   Assessment & Plan: Visit Diagnoses:  1. Low back pain with bilateral sciatica, unspecified back pain laterality, unspecified chronicity     Plan: We'll proceed with facet injection for what looks like facet syndrome L4-5 L5-S1 on the right side. Also follow-up after injection if she's having persistent symptoms.  Follow-Up Instructions: No Follow-up on file.   Orders:  No orders of the defined types were placed in this encounter.  No orders of the defined types were placed in this encounter.     Procedures: No procedures performed   Clinical Data: No additional findings.   Subjective: Chief Complaint  Patient presents with  . Lower Back - Follow-up, Pain    HPI patient had persistent back pain she's been on Clinoril without relief. She has some occasional intermittent numbness on her right side. Just been through chiropractic treatments had an MRI scan that showed some increase facet fluid rated at moderate to severe at the L4-5 and L5-S1 level. Mild edema superior right L5 facet consistent with acute exacerbation of chronic chronic facet arthritis. No disc herniation is central stenosis. We reviewed the MRI scan with the patient and discussed options.  Review of Systems updated unchanged from 06/29/2016 office visit. Special note is coronary disease with a blocked stent anterior sister back pain as well as mini stroke 5 years ago per   Objective: Vital Signs: BP 104/84   Pulse (!) 106   Physical Exam  Constitutional: She is oriented to person, place, and time. She appears well-developed.  HENT:  Head: Normocephalic.  Right Ear: External ear normal.  Left Ear: External ear normal.  Eyes: Pupils are  equal, round, and reactive to light.  Neck: No tracheal deviation present. No thyromegaly present.  Cardiovascular: Normal rate.   Pulmonary/Chest: Effort normal.  Abdominal: Soft.  Musculoskeletal:  Prosser pain with the extension. She has pain with rotation. Some pain that radiates down the right leg down of the knee region doesn't really go into the foot no left leg symptoms. Reflexes are 2+ and symmetrical.  Neurological: She is alert and oriented to person, place, and time.  Skin: Skin is warm and dry.  Psychiatric: She has a normal mood and affect. Her behavior is normal.    Ortho Exam negative straight leg raising 90 dorsally motor weakness she can heel and toe walk. Tenderness palpation along lumbar spine. Pain with lumbar extension.  Specialty Comments:  No specialty comments available.  Imaging: No results found.   PMFS History: Patient Active Problem List   Diagnosis Date Noted  . Thyroid disease   . Depression with anxiety 07/16/2016  . Chronic diastolic CHF (congestive heart failure) ([place]) 07/16/2016  . Aortic atherosclerosis ([place]) 11/24/2015  . Hypothyroidism (acquired)   . Unstable angina ([place]) 08/21/2015  . Chest pain   . Coronary artery disease   . CAD (coronary artery disease), native coronary artery 01/14/2015  . Hypertension 01/14/2015  . Hypercholesterolemia 01/14/2015  . Tobacco abuse 01/14/2015   Past Medical History:  Diagnosis Date  . Chronic diastolic CHF (congestive heart failure), NYHA class 2 ([place])   .  HTN (hypertension)   . Hypercholesterolemia   . Hypothyroidism (acquired)   . Myocardial infarction   . Thyrotoxicosis   . TIA (transient ischemic attack)     Family History  Problem Relation Age of Onset  . Heart disease Mother   . Stroke Mother   . Hypertension Mother   . Heart disease Father   . Cancer Father   . Stroke Brother   . Hypertension Maternal Grandmother   . Heart attack Neg Hx     Past Surgical History:  Procedure  Laterality Date  . BREAST CYST EXCISION    . CARDIAC CATHETERIZATION N/A 08/24/2015   Procedure: Left Heart Cath and Coronary Angiography;  Surgeon: Jettie Booze, MD;  Location: [facility];  Service: Cardiovascular;  Laterality: N/A;  . CORONARY STENT PLACEMENT  1999  . KNEE SURGERY    . partial hysterec    . THYROIDECTOMY     Social History   Occupational History  . [employer]    Social History Main Topics  . Smoking status: Current Every Day Smoker    Packs/day: 0.50    Types: Cigarettes  . Smokeless tobacco: Never Used  . Alcohol use No  . Drug use: Unknown  . Sexual activity: Not on file

## 2016-07-29 NOTE — Progress Notes (Signed)
Order has been entered

## 2016-08-04 ENCOUNTER — Telehealth (INDEPENDENT_AMBULATORY_CARE_PROVIDER_SITE_OTHER): Payer: Self-pay | Admitting: Physical Medicine and Rehabilitation

## 2016-08-04 MED ORDER — DIAZEPAM 5 MG PO TABS: ORAL_TABLET | ORAL | 0 refills | Status: DC

## 2016-08-04 NOTE — Telephone Encounter (Signed)
Prescription faxed to patient's preferred pharmacy.

## 2016-08-04 NOTE — Telephone Encounter (Signed)
See if it printed.

## 2016-08-04 NOTE — Telephone Encounter (Signed)
rx for valium pre-procedure

## 2016-08-15 ENCOUNTER — Encounter (INDEPENDENT_AMBULATORY_CARE_PROVIDER_SITE_OTHER): Payer: Self-pay | Admitting: Physical Medicine and Rehabilitation

## 2016-08-15 ENCOUNTER — Ambulatory Visit (INDEPENDENT_AMBULATORY_CARE_PROVIDER_SITE_OTHER): Payer: Medicaid Other | Admitting: Physical Medicine and Rehabilitation

## 2016-08-15 ENCOUNTER — Encounter (INDEPENDENT_AMBULATORY_CARE_PROVIDER_SITE_OTHER): Payer: Self-pay

## 2016-08-15 VITALS — BP 104/69 | HR 72 | Temp 98.0°F

## 2016-08-15 DIAGNOSIS — M47816 Spondylosis without myelopathy or radiculopathy, lumbar region: Secondary | ICD-10-CM | POA: Diagnosis not present

## 2016-08-15 MED ORDER — LIDOCAINE HCL (PF) 1 % IJ SOLN: 0.3300 mL | Freq: Once | INTRAMUSCULAR | Status: DC

## 2016-08-15 MED ORDER — METHYLPREDNISOLONE ACETATE 80 MG/ML IJ SUSP
80.0000 mg | Freq: Once | INTRAMUSCULAR | Status: AC
  Administered 2016-08-15: 80 mg

## 2016-08-15 NOTE — Patient Instructions (Signed)

## 2016-08-15 NOTE — Progress Notes (Signed)
Jocelyn [name] - 56 y.o. female MRN [id number]  Date of birth: 22-Jun-1961  Office Visit Note: Visit Date: 08/15/2016 PCP: Benito Mccreedy, MD Referred by: Benito Mccreedy, MD  Subjective: Chief Complaint  Patient presents with  . Lower Back - Pain   HPI: Jocelyn [name] is a 56 year old female with complaints of severe worsening chronic lower back pain and radiating down right leg to knee for several months. Occasionally radiates down left leg to knee but states it always worse on the right side. Numbnes right buttock. Dr. Lorin Mercy is following her and request a right L4-5 and L5-S1 facet joint block. Her MRI is reviewed below.    ROS Otherwise per HPI.  Assessment & Plan: Visit Diagnoses:  1. Spondylosis without myelopathy or radiculopathy, lumbar region     Plan: Findings:  Right L4-5 and L5-S1 facet joint block. The future patient may want to have a mild bit of sedation prior to the injection. She did well with the injection however.    Meds & Orders:  Meds ordered this encounter  Medications  . lidocaine (PF) (XYLOCAINE) 1 % injection 0.3 mL  . methylPREDNISolone acetate (DEPO-MEDROL) injection 80 mg    Orders Placed This Encounter  Procedures  . Facet Injection    Follow-up: Return if symptoms worsen or fail to improve after 2 weeks, for follow up with Dr. Lorin Mercy.   Procedures: No procedures performed  Lumbar Facet Joint Intra-Articular Injection(s) with Fluoroscopic Guidance  Patient: Jocelyn [name]      Date of Birth: 02-26-1961 MRN: [id number] PCP: Benito Mccreedy, MD      Visit Date: 08/15/2016   Universal Protocol:    Date/Time: 01/15/182:03 PM  Consent Given By: the patient  Position: PRONE   Additional Comments: Vital signs were monitored before and after the procedure. Patient was prepped and draped in the usual sterile fashion. The correct patient, procedure, and site was verified.   Injection Procedure Details:  Procedure Site One Meds  Administered:  Meds ordered this encounter  Medications  . lidocaine (PF) (XYLOCAINE) 1 % injection 0.3 mL  . methylPREDNISolone acetate (DEPO-MEDROL) injection 80 mg     Laterality: Right  Location/Site:  L4-L5 L5-S1  Needle size: 22 guage  Needle type: Spinal  Needle Placement: Articular  Findings:  -Contrast Used: 2 mL iohexol 180 mg iodine/mL   -Comments: Excellent flow of contrast producing a partial arthrogram.  Procedure Details: The fluoroscope beam is vertically oriented in AP, and the inferior recess is visualized beneath the lower pole of the inferior apophyseal process, which represents the target point for needle insertion. When direct visualization is difficult the target point is located at the medial projection of the vertebral pedicle. The region overlying each aforementioned target is locally anesthetized with a 1 to 2 ml. volume of 1% Lidocaine without Epinephrine.   The spinal needle was inserted into each of the above mentioned facet joints using biplanar fluoroscopic guidance. A 0.25 to 0.5 ml. volume of Isovue-250 was injected and a partial facet joint arthrogram was obtained. A single spot film was obtained of the resulting arthrogram.    One to 1.25 ml of the steroid/anesthetic solution was then injected into each of the facet joints noted above.   Additional Comments:  The patient tolerated the procedure well Dressing: Band-Aid    Post-procedure details: Patient was observed during the procedure. Post-procedure instructions were reviewed.  Patient left the clinic in stable condition.       Clinical History: IMPRESSION: 1. Dominant degenerative finding in  the lumbar spine is facet arthropathy which is moderate to severe at L4-L5 and L5-S1. Favor facet disease is the source of the pain symptoms. Superimposed mild edema in the superior right L5 facet compatible with acute exacerbation of the chronic facet joint arthritis. 2. No lumbar disc  herniation. No lumbar spinal or lateral recess stenosis despite mild epidural lipomatosis. There is mild bilateral L4 neural foraminal stenosis. 3. Lower thoracic disc, endplate, and posterior element degeneration. Moderate right T11, and mild right T12 neural foraminal stenosis.   Electronically Signed   By: Genevie Ann M.D.   On: 07/11/2016 07:58  She reports that she has been smoking Cigarettes.  She has been smoking about 0.50 packs per day. She has never used smokeless tobacco.   Recent Labs  08/22/15 0125  HGBA1C 6.3*    Objective:  VS:  HT:    WT:   BMI:     BP:104/69  HR:72bpm  TEMP:98 F (36.7 C)(Oral)  RESP:100 % Physical Exam  Musculoskeletal:  The patient ambulates without aid with concordant pain with extension rotation of the lumbar spine. Good distal strength.    Ortho Exam Imaging: No results found.  Past Medical/Family/Surgical/Social History: Medications & Allergies reviewed per EMR Patient Active Problem List   Diagnosis Date Noted  . Thyroid disease   . Depression with anxiety 07/16/2016  . Chronic diastolic CHF (congestive heart failure) ([place]) 07/16/2016  . Aortic atherosclerosis ([place]) 11/24/2015  . Hypothyroidism (acquired)   . Unstable angina ([place]) 08/21/2015  . Chest pain   . Coronary artery disease   . CAD (coronary artery disease), native coronary artery 01/14/2015  . Hypertension 01/14/2015  . Hypercholesterolemia 01/14/2015  . Tobacco abuse 01/14/2015   Past Medical History:  Diagnosis Date  . Chronic diastolic CHF (congestive heart failure), NYHA class 2 ([place])   . HTN (hypertension)   . Hypercholesterolemia   . Hypothyroidism (acquired)   . Myocardial infarction   . Thyrotoxicosis   . TIA (transient ischemic attack)    Family History  Problem Relation Age of Onset  . Heart disease Mother   . Stroke Mother   . Hypertension Mother   . Heart disease Father   . Cancer Father   . Stroke Brother   . Hypertension Maternal  Grandmother   . Heart attack Neg Hx    Past Surgical History:  Procedure Laterality Date  . BREAST CYST EXCISION    . CARDIAC CATHETERIZATION N/A 08/24/2015   Procedure: Left Heart Cath and Coronary Angiography;  Surgeon: Jettie Booze, MD;  Location: [facility];  Service: Cardiovascular;  Laterality: N/A;  . CORONARY STENT PLACEMENT  1999  . KNEE SURGERY    . partial hysterec    . THYROIDECTOMY     Social History   Occupational History  . [employer]    Social History Main Topics  . Smoking status: Current Every Day Smoker    Packs/day: 0.50    Types: Cigarettes  . Smokeless tobacco: Never Used  . Alcohol use No  . Drug use: Unknown  . Sexual activity: Not on file

## 2016-08-15 NOTE — Procedures (Signed)
Lumbar Facet Joint Intra-Articular Injection(s) with Fluoroscopic Guidance  Patient: Jocelyn [name]      Date of Birth: June 10, 1961 MRN: [id number] PCP: Benito Mccreedy, MD      Visit Date: 08/15/2016   Universal Protocol:    Date/Time: 01/15/182:03 PM  Consent Given By: the patient  Position: PRONE   Additional Comments: Vital signs were monitored before and after the procedure. Patient was prepped and draped in the usual sterile fashion. The correct patient, procedure, and site was verified.   Injection Procedure Details:  Procedure Site One Meds Administered:  Meds ordered this encounter  Medications  . lidocaine (PF) (XYLOCAINE) 1 % injection 0.3 mL  . methylPREDNISolone acetate (DEPO-MEDROL) injection 80 mg     Laterality: Right  Location/Site:  L4-L5 L5-S1  Needle size: 22 guage  Needle type: Spinal  Needle Placement: Articular  Findings:  -Contrast Used: 2 mL iohexol 180 mg iodine/mL   -Comments: Excellent flow of contrast producing a partial arthrogram.  Procedure Details: The fluoroscope beam is vertically oriented in AP, and the inferior recess is visualized beneath the lower pole of the inferior apophyseal process, which represents the target point for needle insertion. When direct visualization is difficult the target point is located at the medial projection of the vertebral pedicle. The region overlying each aforementioned target is locally anesthetized with a 1 to 2 ml. volume of 1% Lidocaine without Epinephrine.   The spinal needle was inserted into each of the above mentioned facet joints using biplanar fluoroscopic guidance. A 0.25 to 0.5 ml. volume of Isovue-250 was injected and a partial facet joint arthrogram was obtained. A single spot film was obtained of the resulting arthrogram.    One to 1.25 ml of the steroid/anesthetic solution was then injected into each of the facet joints noted above.   Additional Comments:  The patient tolerated the  procedure well Dressing: Band-Aid    Post-procedure details: Patient was observed during the procedure. Post-procedure instructions were reviewed.  Patient left the clinic in stable condition.

## 2016-08-19 ENCOUNTER — Emergency Department (HOSPITAL_COMMUNITY)
Admission: EM | Admit: 2016-08-19 | Discharge: 2016-08-20 | Disposition: A | Discharge status: Home / Self Care | Payer: Medicaid Other | Attending: Emergency Medicine | Admitting: Emergency Medicine

## 2016-08-19 DIAGNOSIS — Y939 Activity, unspecified: Secondary | ICD-10-CM | POA: Diagnosis not present

## 2016-08-19 DIAGNOSIS — Z79899 Other long term (current) drug therapy: Secondary | ICD-10-CM | POA: Insufficient documentation

## 2016-08-19 DIAGNOSIS — I5032 Chronic diastolic (congestive) heart failure: Secondary | ICD-10-CM | POA: Insufficient documentation

## 2016-08-19 DIAGNOSIS — S3992XA Unspecified injury of lower back, initial encounter: Secondary | ICD-10-CM | POA: Diagnosis present

## 2016-08-19 DIAGNOSIS — F17210 Nicotine dependence, cigarettes, uncomplicated: Secondary | ICD-10-CM | POA: Diagnosis not present

## 2016-08-19 DIAGNOSIS — W108XXA Fall (on) (from) other stairs and steps, initial encounter: Secondary | ICD-10-CM | POA: Diagnosis not present

## 2016-08-19 DIAGNOSIS — Y929 Unspecified place or not applicable: Secondary | ICD-10-CM | POA: Diagnosis not present

## 2016-08-19 DIAGNOSIS — I252 Old myocardial infarction: Secondary | ICD-10-CM | POA: Insufficient documentation

## 2016-08-19 DIAGNOSIS — Z8673 Personal history of transient ischemic attack (TIA), and cerebral infarction without residual deficits: Secondary | ICD-10-CM | POA: Insufficient documentation

## 2016-08-19 DIAGNOSIS — Z955 Presence of coronary angioplasty implant and graft: Secondary | ICD-10-CM | POA: Insufficient documentation

## 2016-08-19 DIAGNOSIS — E039 Hypothyroidism, unspecified: Secondary | ICD-10-CM | POA: Diagnosis not present

## 2016-08-19 DIAGNOSIS — I110 Hypertensive heart disease with heart failure: Secondary | ICD-10-CM | POA: Diagnosis not present

## 2016-08-19 DIAGNOSIS — R930 Abnormal findings on diagnostic imaging of skull and head, not elsewhere classified: Secondary | ICD-10-CM | POA: Insufficient documentation

## 2016-08-19 DIAGNOSIS — Z7982 Long term (current) use of aspirin: Secondary | ICD-10-CM | POA: Diagnosis not present

## 2016-08-19 DIAGNOSIS — W19XXXA Unspecified fall, initial encounter: Secondary | ICD-10-CM

## 2016-08-19 DIAGNOSIS — Y999 Unspecified external cause status: Secondary | ICD-10-CM | POA: Insufficient documentation

## 2016-08-19 DIAGNOSIS — I2510 Atherosclerotic heart disease of native coronary artery without angina pectoris: Secondary | ICD-10-CM | POA: Insufficient documentation

## 2016-08-19 HISTORY — DX: Unspecified osteoarthritis, unspecified site: M19.90

## 2016-08-19 NOTE — ED Triage Notes (Signed)
Per EMS pt was found on the third step and had fallen from the top which was 6 steps up. Pt had been laying there for about 20 minutes. Pt stated she did not hit her head and did not loose LOC.Pt stated she hit the middle of her back on the step. She is complaining of right hip pain, left leg pain and lower back pain. EMS applied C-COLLAR. PT stated there was no blurred vision nor headache. Pt would not let them take anything off due to the pain.

## 2016-08-20 ENCOUNTER — Emergency Department (HOSPITAL_COMMUNITY): Payer: Medicaid Other

## 2016-08-20 ENCOUNTER — Encounter (HOSPITAL_COMMUNITY): Payer: Self-pay | Admitting: Emergency Medicine

## 2016-08-20 MED ORDER — KETOROLAC TROMETHAMINE 30 MG/ML IJ SOLN
30.0000 mg | Freq: Once | INTRAMUSCULAR | Status: AC
  Administered 2016-08-20: 30 mg via INTRAVENOUS
  Filled 2016-08-20: qty 1

## 2016-08-20 MED ORDER — TRAMADOL HCL 50 MG PO TABS: 50.0000 mg | ORAL_TABLET | Freq: Two times a day (BID) | ORAL | 0 refills | Status: DC | PRN

## 2016-08-20 MED ORDER — MORPHINE SULFATE (PF) 4 MG/ML IV SOLN
4.0000 mg | Freq: Once | INTRAVENOUS | Status: AC
  Administered 2016-08-20: 4 mg via INTRAVENOUS
  Filled 2016-08-20: qty 1

## 2016-08-20 NOTE — ED Provider Notes (Signed)
[facility] DEPT Provider Note   CSN: [id number] Arrival date & time: 08/19/16  2347  By signing my name below, I, Evelene Croon, attest that this documentation has been prepared under the direction and in the presence of Everlene Balls, MD. Electronically Signed: Evelene Croon, Scribe. 08/20/2016. 1:20 AM.  History   Chief Complaint Chief Complaint  Patient presents with  . Fall    The history is provided by the patient. No language interpreter was used.     HPI Comments:  Jocelyn [name] is a 56 y.o. female who presents to the Emergency Department s/p fall PTA complaining of constant pain across her lower back and buttocks, and following the incident. She describes her pain as sharp and states pain shoots down her legs. Pt states she slipped on the snow and fell onto her buttocks but notes she fell down steps and struck her back on a step behind her. No LOC.  Pt reports associated neck pain. Pt arrives to ED via EMS in C-collar.   Past Medical History:  Diagnosis Date  . Arthritis   . Chronic diastolic CHF (congestive heart failure), NYHA class 2 ([place])   . HTN (hypertension)   . Hypercholesterolemia   . Hypothyroidism (acquired)   . Myocardial infarction   . Thyrotoxicosis   . TIA (transient ischemic attack)     Patient Active Problem List   Diagnosis Date Noted  . Thyroid disease   . Depression with anxiety 07/16/2016  . Chronic diastolic CHF (congestive heart failure) ([place]) 07/16/2016  . Aortic atherosclerosis ([place]) 11/24/2015  . Hypothyroidism (acquired)   . Unstable angina ([place]) 08/21/2015  . Chest pain   . Coronary artery disease   . CAD (coronary artery disease), native coronary artery 01/14/2015  . Hypertension 01/14/2015  . Hypercholesterolemia 01/14/2015  . Tobacco abuse 01/14/2015    Past Surgical History:  Procedure Laterality Date  . BREAST CYST EXCISION    . CARDIAC CATHETERIZATION N/A 08/24/2015   Procedure: Left Heart Cath and Coronary Angiography;   Surgeon: Jettie Booze, MD;  Location: [facility];  Service: Cardiovascular;  Laterality: N/A;  . CORONARY STENT PLACEMENT  1999  . KNEE SURGERY    . partial hysterec    . THYROIDECTOMY      OB History    No data available       Home Medications    Prior to Admission medications   Medication Sig Start Date End Date Taking? Authorizing Provider  albuterol (PROVENTIL HFA;VENTOLIN HFA) 108 (90 Base) MCG/ACT inhaler Inhale 2 puffs into the lungs every 4 (four) hours as needed for shortness of breath.  06/21/15 07/16/16  Historical Provider, MD  ALPRAZolam (XANAX) 1 MG tablet TAKE 1 TABLET BY MOUTH 3 TIMES A DAY AS NEEDED FOR ANXIETY OR PANIC ATTACKS 06/03/16   Historical Provider, MD  aspirin 325 MG tablet Take 325 mg by mouth daily.    Historical Provider, MD  cyclobenzaprine (FLEXERIL) 10 MG tablet Take 10 mg by mouth 3 (three) times daily as needed for muscle spasms.  06/30/15   Historical Provider, MD  diazepam (VALIUM) 5 MG tablet Take 1 by mouth 1 to 2 hours pre-procedure. May repeat if necessary. 08/04/16   Magnus Sinning, MD  doxepin (SINEQUAN) 10 MG capsule Take 10-20 mg by mouth at bedtime as needed (for sleep).  01/02/15   Historical Provider, MD  estradiol (ESTRACE) 0.5 MG tablet Take 0.5 mg by mouth daily. 01/05/15   Historical Provider, MD  famotidine (PEPCID) 20  MG tablet Take 1 tablet (20 mg total) by mouth 2 (two) times daily. 07/17/16   [name], MD  furosemide (LASIX) 40 MG tablet Take 40 mg by mouth daily.     Historical Provider, MD  HYDROcodone-ibuprofen (VICOPROFEN) 7.5-200 MG tablet Take 1 tablet by mouth every 6 (six) hours as needed. 06/22/16   Historical Provider, MD  isosorbide mononitrate (IMDUR) 30 MG 24 hr tablet Take 2 tablets (60 mg total) by mouth daily. 07/17/16   [name], MD  KLOR-CON M10 10 MEQ tablet Take 10 mEq by mouth 2 (two) times daily. 07/06/15   Historical Provider, MD  levothyroxine (SYNTHROID, LEVOTHROID) 50 MCG tablet Take 50  mcg by mouth daily. 10/27/15   Historical Provider, MD  methimazole (TAPAZOLE) 5 MG tablet Take 2.5 mg by mouth daily. 05/29/16   Historical Provider, MD  metoprolol tartrate (LOPRESSOR) 25 MG tablet Take 1 tablet (25 mg total) by mouth 2 (two) times daily. 07/17/16   [name], MD  nitroGLYCERIN (NITROSTAT) 0.3 MG SL tablet TAKE 1 TABLET UNDER THE TONGUE EVERY 5 MINUTES AS NEEDED FOR CHEST PAIN 10/28/15   Historical Provider, MD  promethazine-codeine (PHENERGAN WITH CODEINE) 6.25-10 MG/5ML syrup Take 5 mLs by mouth every 6 (six) hours as needed for cough.    Historical Provider, MD  simvastatin (ZOCOR) 20 MG tablet Take 20 mg by mouth at bedtime. 01/03/15   Historical Provider, MD  spironolactone (ALDACTONE) 25 MG tablet Take 25 mg by mouth 2 (two) times daily. 01/03/15   Historical Provider, MD  sulindac (CLINORIL) 200 MG tablet Take 200 mg by mouth daily as needed (BACK PAIN).  10/29/15   Historical Provider, MD  triamcinolone cream (KENALOG) 0.5 % Apply 1 application topically daily as needed (for sun exposure).    Historical Provider, MD    Family History Family History  Problem Relation Age of Onset  . Heart disease Mother   . Stroke Mother   . Hypertension Mother   . Heart disease Father   . Cancer Father   . Stroke Brother   . Hypertension Maternal Grandmother   . Heart attack Neg Hx     Social History Social History  Substance Use Topics  . Smoking status: Current Every Day Smoker    Packs/day: 0.50    Types: Cigarettes  . Smokeless tobacco: Never Used  . Alcohol use No     Allergies   Patient has no known allergies.   Review of Systems Review of Systems  10 systems reviewed and all are negative for acute change except as noted in the HPI.   Physical Exam Updated Vital Signs BP 113/77 (BP Location: Left Arm)   Pulse 90   Resp 18   Ht 5\' 4"  (1.626 m)   SpO2 95%   Physical Exam  Constitutional: She is oriented to person, place, and time. She appears  well-developed and well-nourished. No distress.  HENT:  Head: Normocephalic and atraumatic.  Nose: Nose normal.  Mouth/Throat: Oropharynx is clear and moist. No oropharyngeal exudate.  Eyes: Conjunctivae and EOM are normal. Pupils are equal, round, and reactive to light. No scleral icterus.  Neck: Normal range of motion. Neck supple. No JVD present. No tracheal deviation present. No thyromegaly present.  C-collar in place.   Cardiovascular: Normal rate, regular rhythm and normal heart sounds.  Exam reveals no gallop and no friction rub.   No murmur heard. Pulmonary/Chest: Effort normal and breath sounds normal. No respiratory distress. She has no wheezes. She exhibits  no tenderness.  Abdominal: Soft. Bowel sounds are normal. She exhibits no distension and no mass. There is no tenderness. There is no rebound and no guarding.  Musculoskeletal: Normal range of motion. She exhibits no edema or tenderness.  Lymphadenopathy:    She has no cervical adenopathy.  Neurological: She is alert and oriented to person, place, and time. No cranial nerve deficit. She exhibits normal muscle tone.  Normal strength and sensation to all extremities Normal cerebellar testing  Skin: Skin is warm and dry. No rash noted. No erythema. No pallor.  Nursing note and vitals reviewed.    ED Treatments / Results  DIAGNOSTIC STUDIES:  Oxygen Saturation is 97% on RA, normal by my interpretation.    COORDINATION OF CARE:  1:13 AM Discussed treatment plan with pt at bedside and pt agreed to plan.  Labs (all labs ordered are listed, but only abnormal results are displayed) Labs Reviewed - No data to display  EKG  EKG Interpretation None       Radiology Ct Head Wo Contrast  Result Date: 08/20/2016 CLINICAL DATA:  Slip on snow and fall down 6 steps. Concern for head and cervical spine injury. EXAM: CT HEAD WITHOUT CONTRAST CT CERVICAL SPINE WITHOUT CONTRAST TECHNIQUE: Multidetector CT imaging of the head and  cervical spine was performed following the standard protocol without intravenous contrast. Multiplanar CT image reconstructions of the cervical spine were also generated. COMPARISON:  None. FINDINGS: CT HEAD FINDINGS Brain: No evidence of acute infarction, hemorrhage, hydrocephalus, extra-axial collection or mass lesion/mass effect. Vascular: No hyperdense vessel or unexpected calcification. Skull: Normal. Negative for fracture or focal lesion. Sinuses/Orbits: Paranasal sinuses and mastoid air cells are clear. The visualized orbits are unremarkable. Other: None. CT CERVICAL SPINE FINDINGS Alignment: Straightening of normal lordosis. No listhesis, jumped or perched facets. Skull base and vertebrae: No acute fracture. The dens is intact. No primary bone lesion or focal pathologic process. Soft tissues and spinal canal: No prevertebral fluid or swelling. No visible canal hematoma. Disc levels: Endplate spurring with mild disc space narrowing C4-C5 through C6-C7. Scattered facet arthropathy. Upper chest: No definite acute abnormality, mild motion artifact. Other: None. IMPRESSION: 1.  No acute intracranial abnormality.  No skull fracture. 2. Degenerative change in the cervical spine without acute fracture or subluxation. Electronically Signed   By: Jeb Levering M.D.   On: 08/20/2016 02:34   Ct Cervical Spine Wo Contrast  Result Date: 08/20/2016 CLINICAL DATA:  Slip on snow and fall down 6 steps. Concern for head and cervical spine injury. EXAM: CT HEAD WITHOUT CONTRAST CT CERVICAL SPINE WITHOUT CONTRAST TECHNIQUE: Multidetector CT imaging of the head and cervical spine was performed following the standard protocol without intravenous contrast. Multiplanar CT image reconstructions of the cervical spine were also generated. COMPARISON:  None. FINDINGS: CT HEAD FINDINGS Brain: No evidence of acute infarction, hemorrhage, hydrocephalus, extra-axial collection or mass lesion/mass effect. Vascular: No hyperdense  vessel or unexpected calcification. Skull: Normal. Negative for fracture or focal lesion. Sinuses/Orbits: Paranasal sinuses and mastoid air cells are clear. The visualized orbits are unremarkable. Other: None. CT CERVICAL SPINE FINDINGS Alignment: Straightening of normal lordosis. No listhesis, jumped or perched facets. Skull base and vertebrae: No acute fracture. The dens is intact. No primary bone lesion or focal pathologic process. Soft tissues and spinal canal: No prevertebral fluid or swelling. No visible canal hematoma. Disc levels: Endplate spurring with mild disc space narrowing C4-C5 through C6-C7. Scattered facet arthropathy. Upper chest: No definite acute abnormality, mild motion artifact.  Other: None. IMPRESSION: 1.  No acute intracranial abnormality.  No skull fracture. 2. Degenerative change in the cervical spine without acute fracture or subluxation. Electronically Signed   By: Jeb Levering M.D.   On: 08/20/2016 02:34   Ct Thoracic Spine Wo Contrast  Result Date: 08/20/2016 CLINICAL DATA:  Patient fell down 6 steps, pain EXAM: CT THORACIC AND LUMBAR SPINE WITHOUT CONTRAST TECHNIQUE: Multidetector CT imaging of the thoracic and lumbar spine was performed without contrast. Multiplanar CT image reconstructions were also generated. COMPARISON:  07/10/2016, 06/29/2016, 11/19/2015 FINDINGS: CT THORACIC SPINE FINDINGS Alignment: Thoracic alignment within normal limits. Vertebrae: Vertebral body heights are normal. No fracture identified. Paraspinal and other soft tissues: No paravertebral or paraspinal soft tissue abnormality. Atherosclerosis of the aorta. Lung fields are clear. Disc levels: Minimal anterior osteophytes. No significant disc space narrowing. CT LUMBAR SPINE FINDINGS Segmentation: Normal, 5 non rib-bearing lumbar type vertebra. Alignment: Lumbar lordosis.  No subluxation. Vertebrae: Normal stature.  No fracture. Paraspinal and other soft tissues: 11 mm stone in the lower left kidney.  Atherosclerosis of the aorta. Disc levels: Suspect broad based disc bulge at L4-L5 and L5-S1. Bilateral facet arthropathy from L3 through S1. IMPRESSION: CT THORACIC SPINE IMPRESSION No acute fracture or malalignment CT LUMBAR SPINE IMPRESSION No acute fracture or malalignment. 11 mm stone in the lower left kidney. Electronically Signed   By: Donavan Foil M.D.   On: 08/20/2016 02:57   Ct Lumbar Spine Wo Contrast  Result Date: 08/20/2016 CLINICAL DATA:  Patient fell down 6 steps, pain EXAM: CT THORACIC AND LUMBAR SPINE WITHOUT CONTRAST TECHNIQUE: Multidetector CT imaging of the thoracic and lumbar spine was performed without contrast. Multiplanar CT image reconstructions were also generated. COMPARISON:  07/10/2016, 06/29/2016, 11/19/2015 FINDINGS: CT THORACIC SPINE FINDINGS Alignment: Thoracic alignment within normal limits. Vertebrae: Vertebral body heights are normal. No fracture identified. Paraspinal and other soft tissues: No paravertebral or paraspinal soft tissue abnormality. Atherosclerosis of the aorta. Lung fields are clear. Disc levels: Minimal anterior osteophytes. No significant disc space narrowing. CT LUMBAR SPINE FINDINGS Segmentation: Normal, 5 non rib-bearing lumbar type vertebra. Alignment: Lumbar lordosis.  No subluxation. Vertebrae: Normal stature.  No fracture. Paraspinal and other soft tissues: 11 mm stone in the lower left kidney. Atherosclerosis of the aorta. Disc levels: Suspect broad based disc bulge at L4-L5 and L5-S1. Bilateral facet arthropathy from L3 through S1. IMPRESSION: CT THORACIC SPINE IMPRESSION No acute fracture or malalignment CT LUMBAR SPINE IMPRESSION No acute fracture or malalignment. 11 mm stone in the lower left kidney. Electronically Signed   By: Donavan Foil M.D.   On: 08/20/2016 02:57   Dg Hip Unilat W Or Wo Pelvis 2-3 Views Left  Result Date: 08/20/2016 CLINICAL DATA:  Fall down stairs tonight, left hip pain. EXAM: DG HIP (WITH OR WITHOUT PELVIS) 2-3V LEFT  COMPARISON:  None. FINDINGS: The cortical margins of the bony pelvis and left hip are intact. No fracture. Pubic symphysis and sacroiliac joints are congruent. Both femoral heads are well-seated in the respective acetabula. Minimal acetabular and femoral head spurring. IMPRESSION: No fracture or subluxation of the pelvis or left hip. Electronically Signed   By: Jeb Levering M.D.   On: 08/20/2016 02:56    Procedures Procedures (including critical care time)  Medications Ordered in ED Medications  ketorolac (TORADOL) 30 MG/ML injection 30 mg (30 mg Intravenous Given 08/20/16 0237)  morphine 4 MG/ML injection 4 mg (4 mg Intravenous Given 08/20/16 0237)     Initial Impression / Assessment and  Plan / ED Course  I have reviewed the triage vital signs and the nursing notes.  Pertinent labs & imaging results that were available during my care of the patient were reviewed by me and considered in my medical decision making (see chart for details).    Patient presents to the ED for a fall.  She has pain over her head and entire spine with history of nerve compression in her spine.  Also pain in her hip.  CT and XR neg for injury. She was given toradol and morphine which has relieved her pain.  Advised on ibuprofen at home and tramadol for severe pain. PCP fu advised within 3 days.  Patient educated on bruising in the area.  She appears well and in NAD. VS remain within her normal llimits and she is safe for DC.    Final Clinical Impressions(s) / ED Diagnoses   Final diagnoses:  None    New Prescriptions New Prescriptions   No medications on file   I personally performed the services described in this documentation, which was scribed in my presence. The recorded information has been reviewed and is accurate.       Everlene Balls, MD 08/20/16 [phone]

## 2016-08-20 NOTE — ED Notes (Signed)
Pt.is dressed into a gown.

## 2016-08-20 NOTE — ED Notes (Signed)
Pt transported to CT ?

## 2016-09-19 ENCOUNTER — Telehealth (INDEPENDENT_AMBULATORY_CARE_PROVIDER_SITE_OTHER): Payer: Self-pay | Admitting: *Deleted

## 2016-09-19 NOTE — Telephone Encounter (Signed)
Pt calling for tramadol refill.

## 2016-09-19 NOTE — Telephone Encounter (Signed)
Please advise 

## 2016-09-19 NOTE — Telephone Encounter (Signed)
OK for tramadol  # 15    One po daily prn pain. ucall thanks

## 2016-09-20 MED ORDER — TRAMADOL HCL 50 MG PO TABS: 50.0000 mg | ORAL_TABLET | Freq: Every day | ORAL | 0 refills | Status: DC | PRN

## 2016-09-20 NOTE — Telephone Encounter (Signed)
Entered and called to pharmacy

## 2016-10-19 ENCOUNTER — Ambulatory Visit (INDEPENDENT_AMBULATORY_CARE_PROVIDER_SITE_OTHER): Payer: Medicaid Other | Admitting: Family

## 2016-10-19 ENCOUNTER — Encounter (INDEPENDENT_AMBULATORY_CARE_PROVIDER_SITE_OTHER): Payer: Self-pay | Admitting: Family

## 2016-10-19 DIAGNOSIS — M4726 Other spondylosis with radiculopathy, lumbar region: Secondary | ICD-10-CM | POA: Diagnosis not present

## 2016-10-19 MED ORDER — OXYCODONE-ACETAMINOPHEN 5-325 MG PO TABS: 1.0000 | ORAL_TABLET | Freq: Four times a day (QID) | ORAL | 0 refills | Status: DC | PRN

## 2016-10-19 NOTE — Progress Notes (Signed)
Office Visit Note   Patient: Jocelyn [name]           Date of Birth: 06/17/1961           MRN: [id number] Visit Date: 10/19/2016              Requested by: Benito Mccreedy, MD [address] [place], [place] [address] PCP: Benito Mccreedy, MD  Chief Complaint  Patient presents with  . Lower Back - Pain    Previous injection with Dr. Ernestina Patches    HPI: The patient is a 56 year old woman who presents today for evaluation of low back pain with radicular symptoms down the right buttock and thigh. This is made worse by ambulating. Is leaning off in the chair to the left for comfort. Did have a fall down the stairs in which she fell on her lumbar spine. States her right buttock cheek is numb, this is chronic. Radiographs and CTs done in the emergency department were reassuring for no acute injury. These were reviewed today.  She has had epidural steroids with Dr. Ernestina Patches in the past which have worked well. Last one was in January. She has been taking Vicodin and tramadol with minimal relief. States was like to proceed with another injection.  States the Vicodin did make her have an upset stomach.    Assessment & Plan: Visit Diagnoses:  1. Other spondylosis with radiculopathy, lumbar region     Plan: We'll set her up for an appointment with Dr. Ernestina Patches. Have provided a short-term prescription for Percocet for pain to get her through to her injection.  Follow-Up Instructions: Return if symptoms worsen or fail to improve.   Back Exam   Tenderness  The patient is experiencing tenderness in the lumbar and sacroiliac.  Range of Motion  The patient has normal back ROM.  Muscle Strength  The patient has normal back strength.  Other  Gait: normal      Physical Exam  Constitutional: Appears well-developed.  Head: Normocephalic.  Eyes: EOM are normal.  Neck: Normal range of motion.  Cardiovascular: Normal rate.   Pulmonary/Chest: Effort normal.  Neurological: Is  alert.  Skin: Skin is warm.  Psychiatric: Has a normal mood and affect.  Imaging: No results found.  Labs: Lab Results  Component Value Date   HGBA1C 6.3 (H) 08/22/2015    Orders:  No orders of the defined types were placed in this encounter.  No orders of the defined types were placed in this encounter.    Procedures: No procedures performed  Clinical Data: No additional findings.  Subjective: Review of Systems  Constitutional: Negative for chills and fever.  Musculoskeletal: Positive for back pain.  Neurological: Positive for numbness. Negative for weakness.    Objective: Vital Signs: Ht 5\' 4"  (1.626 m)   Wt 215 lb (97.5 kg)   BMI 36.90 kg/m   Specialty Comments:  No specialty comments available.  PMFS History: Patient Active Problem List   Diagnosis Date Noted  . Other spondylosis with radiculopathy, lumbar region 10/19/2016  . Thyroid disease   . Depression with anxiety 07/16/2016  . Chronic diastolic CHF (congestive heart failure) ([place]) 07/16/2016  . Aortic atherosclerosis ([place]) 11/24/2015  . Hypothyroidism (acquired)   . Unstable angina ([place]) 08/21/2015  . Chest pain   . Coronary artery disease   . CAD (coronary artery disease), native coronary artery 01/14/2015  . Hypertension 01/14/2015  . Hypercholesterolemia 01/14/2015  . Tobacco abuse 01/14/2015   Past Medical History:  Diagnosis Date  .  Arthritis   . Chronic diastolic CHF (congestive heart failure), NYHA class 2 ([place])   . HTN (hypertension)   . Hypercholesterolemia   . Hypothyroidism (acquired)   . Myocardial infarction   . Thyrotoxicosis   . TIA (transient ischemic attack)     Family History  Problem Relation Age of Onset  . Heart disease Mother   . Stroke Mother   . Hypertension Mother   . Heart disease Father   . Cancer Father   . Stroke Brother   . Hypertension Maternal Grandmother   . Heart attack Neg Hx     Past Surgical History:  Procedure Laterality Date  . BREAST  CYST EXCISION    . CARDIAC CATHETERIZATION N/A 08/24/2015   Procedure: Left Heart Cath and Coronary Angiography;  Surgeon: Jettie Booze, MD;  Location: [facility];  Service: Cardiovascular;  Laterality: N/A;  . CORONARY STENT PLACEMENT  1999  . KNEE SURGERY    . partial hysterec    . THYROIDECTOMY     Social History   Occupational History  . [employer]    Social History Main Topics  . Smoking status: Current Every Day Smoker    Packs/day: 0.50    Types: Cigarettes  . Smokeless tobacco: Never Used  . Alcohol use No  . Drug use: Unknown  . Sexual activity: Not on file

## 2016-10-26 ENCOUNTER — Ambulatory Visit (INDEPENDENT_AMBULATORY_CARE_PROVIDER_SITE_OTHER): Payer: Medicaid Other

## 2016-10-26 ENCOUNTER — Ambulatory Visit (INDEPENDENT_AMBULATORY_CARE_PROVIDER_SITE_OTHER): Payer: Medicaid Other | Admitting: Physical Medicine and Rehabilitation

## 2016-10-26 VITALS — BP 110/77 | HR 98

## 2016-10-26 DIAGNOSIS — M47816 Spondylosis without myelopathy or radiculopathy, lumbar region: Secondary | ICD-10-CM | POA: Diagnosis not present

## 2016-10-26 MED ORDER — LIDOCAINE HCL (PF) 1 % IJ SOLN
0.3300 mL | Freq: Once | INTRAMUSCULAR | Status: AC
  Administered 2016-10-26: 0.3 mL

## 2016-10-26 MED ORDER — METHYLPREDNISOLONE ACETATE 80 MG/ML IJ SUSP
80.0000 mg | Freq: Once | INTRAMUSCULAR | Status: AC
  Administered 2016-10-26: 80 mg

## 2016-10-26 NOTE — Patient Instructions (Signed)

## 2016-10-26 NOTE — Progress Notes (Signed)
Jocelyn [name] - 56 y.o. female MRN [id number]  Date of birth: [birth year]  Office Visit Note: Visit Date: 10/26/2016 PCP: Benito Mccreedy, MD Referred by: Benito Mccreedy, MD  Subjective: Chief Complaint  Patient presents with  . Lower Back - Pain   HPI: Jocelyn [name] is a 56 year old female with chronic worsening severe axial low back pain in the lower back pain and pain on bilateral sides. Pain level is a (5/10). Pain is constant. Takes oxycodone for the pain which helps some. Worse when weightbearing. She has some tingling at times. Pain radiates down thigh. She did get relief with prior facet joint blocks on the right but now her symptoms are bilateral.    ROS Otherwise per HPI.  Assessment & Plan: Visit Diagnoses:  1. Spondylosis without myelopathy or radiculopathy, lumbar region     Plan: Findings:  Bilateral L4-5 and L5-S1 facet joint blocks.    Meds & Orders:  Meds ordered this encounter  Medications  . lidocaine (PF) (XYLOCAINE) 1 % injection 0.3 mL  . methylPREDNISolone acetate (DEPO-MEDROL) injection 80 mg    Orders Placed This Encounter  Procedures  . Facet Injection  . XR C-ARM NO REPORT    Follow-up: Return if symptoms worsen or fail to improve.   Procedures: No procedures performed  Lumbar Facet Joint Intra-Articular Injection(s) with Fluoroscopic Guidance  Patient: Jocelyn [name]      Date of Birth: October 15, 1960 MRN: [id number] PCP: Benito Mccreedy, MD      Visit Date: 10/26/2016   Universal Protocol:    Date/Time: 03/30/185:48 AM  Consent Given By: the patient  Position: PRONE   Additional Comments: Vital signs were monitored before and after the procedure. Patient was prepped and draped in the usual sterile fashion. The correct patient, procedure, and site was verified.   Injection Procedure Details:  Procedure Site One Meds Administered:  Meds ordered this encounter  Medications  . lidocaine (PF) (XYLOCAINE) 1 % injection 0.3 mL    . methylPREDNISolone acetate (DEPO-MEDROL) injection 80 mg     Laterality: Bilateral  Location/Site:  L4-L5 L5-S1  Needle size: 22 guage  Needle type: Spinal  Needle Placement: Articular  Findings:  -Contrast Used: 1 mL iohexol 180 mg iodine/mL   -Comments: Excellent flow of contrast producing a partial arthrogram.  Procedure Details: The fluoroscope beam is vertically oriented in AP, and the inferior recess is visualized beneath the lower pole of the inferior apophyseal process, which represents the target point for needle insertion. When direct visualization is difficult the target point is located at the medial projection of the vertebral pedicle. The region overlying each aforementioned target is locally anesthetized with a 1 to 2 ml. volume of 1% Lidocaine without Epinephrine.   The spinal needle was inserted into each of the above mentioned facet joints using biplanar fluoroscopic guidance. A 0.25 to 0.5 ml. volume of Isovue-250 was injected and a partial facet joint arthrogram was obtained. A single spot film was obtained of the resulting arthrogram.    One to 1.25 ml of the steroid/anesthetic solution was then injected into each of the facet joints noted above.   Additional Comments:  The patient tolerated the procedure well Dressing: Band-Aid    Post-procedure details: Patient was observed during the procedure. Post-procedure instructions were reviewed.  Patient left the clinic in stable condition.     Clinical History: Lumbar spine MRI 07/11/2016 IMPRESSION: 1. Dominant degenerative finding in the lumbar spine is facet arthropathy which is moderate to severe at L4-L5 and L5-S1. Favor  facet disease is the source of the pain symptoms. Superimposed mild edema in the superior right L5 facet compatible with acute exacerbation of the chronic facet joint arthritis. 2. No lumbar disc herniation. No lumbar spinal or lateral recess stenosis despite mild epidural  lipomatosis. There is mild bilateral L4 neural foraminal stenosis. 3. Lower thoracic disc, endplate, and posterior element degeneration. Moderate right T11, and mild right T12 neural foraminal stenosis.  She reports that she has been smoking Cigarettes.  She has been smoking about 0.50 packs per day. She has never used smokeless tobacco. No results for input(s): HGBA1C, LABURIC in the last 8760 hours.  Objective:  VS:  HT:    WT:   BMI:     BP:110/77  HR:98bpm  TEMP: ( )  RESP:  Physical Exam  Musculoskeletal:  Concordant low back pain with extension rotation bilaterally. She has good distal strength without clonus.    Ortho Exam Imaging: No results found.  Past Medical/Family/Surgical/Social History: Medications & Allergies reviewed per EMR Patient Active Problem List   Diagnosis Date Noted  . Other spondylosis with radiculopathy, lumbar region 10/19/2016  . Thyroid disease   . Depression with anxiety 07/16/2016  . Chronic diastolic CHF (congestive heart failure) ([place]) 07/16/2016  . Aortic atherosclerosis ([place]) 11/24/2015  . Hypothyroidism (acquired)   . Unstable angina ([place]) 08/21/2015  . Chest pain   . Coronary artery disease   . CAD (coronary artery disease), native coronary artery 01/14/2015  . Hypertension 01/14/2015  . Hypercholesterolemia 01/14/2015  . Tobacco abuse 01/14/2015   Past Medical History:  Diagnosis Date  . Arthritis   . Chronic diastolic CHF (congestive heart failure), NYHA class 2 ([place])   . HTN (hypertension)   . Hypercholesterolemia   . Hypothyroidism (acquired)   . Myocardial infarction   . Thyrotoxicosis   . TIA (transient ischemic attack)    Family History  Problem Relation Age of Onset  . Heart disease Mother   . Stroke Mother   . Hypertension Mother   . Heart disease Father   . Cancer Father   . Stroke Brother   . Hypertension Maternal Grandmother   . Heart attack Neg Hx    Past Surgical History:  Procedure Laterality Date  .  BREAST CYST EXCISION    . CARDIAC CATHETERIZATION N/A 08/24/2015   Procedure: Left Heart Cath and Coronary Angiography;  Surgeon: Jettie Booze, MD;  Location: [facility];  Service: Cardiovascular;  Laterality: N/A;  . CORONARY STENT PLACEMENT  1999  . KNEE SURGERY    . partial hysterec    . THYROIDECTOMY     Social History   Occupational History  . [employer]    Social History Main Topics  . Smoking status: Current Every Day Smoker    Packs/day: 0.50    Types: Cigarettes  . Smokeless tobacco: Never Used  . Alcohol use No  . Drug use: Unknown  . Sexual activity: Not on file

## 2016-10-28 NOTE — Procedures (Signed)
Lumbar Facet Joint Intra-Articular Injection(s) with Fluoroscopic Guidance  Patient: Jocelyn [name]      Date of Birth: 08/18/1960 MRN: [id number] PCP: Benito Mccreedy, MD      Visit Date: 10/26/2016   Universal Protocol:    Date/Time: 03/30/185:48 AM  Consent Given By: the patient  Position: PRONE   Additional Comments: Vital signs were monitored before and after the procedure. Patient was prepped and draped in the usual sterile fashion. The correct patient, procedure, and site was verified.   Injection Procedure Details:  Procedure Site One Meds Administered:  Meds ordered this encounter  Medications  . lidocaine (PF) (XYLOCAINE) 1 % injection 0.3 mL  . methylPREDNISolone acetate (DEPO-MEDROL) injection 80 mg     Laterality: Bilateral  Location/Site:  L4-L5 L5-S1  Needle size: 22 guage  Needle type: Spinal  Needle Placement: Articular  Findings:  -Contrast Used: 1 mL iohexol 180 mg iodine/mL   -Comments: Excellent flow of contrast producing a partial arthrogram.  Procedure Details: The fluoroscope beam is vertically oriented in AP, and the inferior recess is visualized beneath the lower pole of the inferior apophyseal process, which represents the target point for needle insertion. When direct visualization is difficult the target point is located at the medial projection of the vertebral pedicle. The region overlying each aforementioned target is locally anesthetized with a 1 to 2 ml. volume of 1% Lidocaine without Epinephrine.   The spinal needle was inserted into each of the above mentioned facet joints using biplanar fluoroscopic guidance. A 0.25 to 0.5 ml. volume of Isovue-250 was injected and a partial facet joint arthrogram was obtained. A single spot film was obtained of the resulting arthrogram.    One to 1.25 ml of the steroid/anesthetic solution was then injected into each of the facet joints noted above.   Additional Comments:  The patient tolerated  the procedure well Dressing: Band-Aid    Post-procedure details: Patient was observed during the procedure. Post-procedure instructions were reviewed.  Patient left the clinic in stable condition.

## 2016-10-29 ENCOUNTER — Encounter (HOSPITAL_COMMUNITY): Payer: Self-pay | Admitting: Emergency Medicine

## 2016-10-29 ENCOUNTER — Ambulatory Visit (HOSPITAL_COMMUNITY)
Admission: EM | Admit: 2016-10-29 | Discharge: 2016-10-29 | Disposition: A | Discharge status: Home / Self Care | Payer: Medicaid Other | Attending: Family Medicine | Admitting: Family Medicine

## 2016-10-29 DIAGNOSIS — M546 Pain in thoracic spine: Secondary | ICD-10-CM | POA: Diagnosis not present

## 2016-10-29 MED ORDER — IBUPROFEN 800 MG PO TABS: 800.0000 mg | ORAL_TABLET | Freq: Three times a day (TID) | ORAL | 0 refills | Status: DC

## 2016-10-29 MED ORDER — KETOROLAC TROMETHAMINE 30 MG/ML IJ SOLN
INTRAMUSCULAR | Status: AC
  Filled 2016-10-29: qty 1

## 2016-10-29 MED ORDER — KETOROLAC TROMETHAMINE 30 MG/ML IJ SOLN
30.0000 mg | Freq: Once | INTRAMUSCULAR | Status: AC
  Administered 2016-10-29: 30 mg via INTRAMUSCULAR

## 2016-10-29 NOTE — Discharge Instructions (Signed)
Stop taking Meloxicam and take Ibuprofen for anti inflammation.  Once prescription for motrin is finished may resume Meloxicam for arthritis.  Take Cyclobenzaprine 10mg  po tid prn muscle spasm.  May use heating pad on back as needed.
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
## 2016-12-05 ENCOUNTER — Ambulatory Visit (INDEPENDENT_AMBULATORY_CARE_PROVIDER_SITE_OTHER): Payer: Medicaid Other

## 2016-12-05 ENCOUNTER — Ambulatory Visit (INDEPENDENT_AMBULATORY_CARE_PROVIDER_SITE_OTHER): Payer: Medicaid Other | Admitting: Family

## 2016-12-05 DIAGNOSIS — M549 Dorsalgia, unspecified: Secondary | ICD-10-CM

## 2016-12-05 MED ORDER — TRAMADOL HCL 50 MG PO TABS: 50.0000 mg | ORAL_TABLET | Freq: Two times a day (BID) | ORAL | 0 refills | Status: DC
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
## 2016-12-14 ENCOUNTER — Encounter: Payer: Self-pay | Admitting: Physical Therapy

## 2016-12-14 ENCOUNTER — Ambulatory Visit: Payer: Medicaid Other | Attending: Family | Admitting: Physical Therapy

## 2016-12-14 DIAGNOSIS — M62830 Muscle spasm of back: Secondary | ICD-10-CM

## 2016-12-14 DIAGNOSIS — G8929 Other chronic pain: Secondary | ICD-10-CM | POA: Insufficient documentation

## 2016-12-14 DIAGNOSIS — M545 Low back pain: Secondary | ICD-10-CM | POA: Diagnosis present

## 2016-12-14 DIAGNOSIS — M546 Pain in thoracic spine: Secondary | ICD-10-CM | POA: Insufficient documentation

## 2016-12-14 DIAGNOSIS — R293 Abnormal posture: Secondary | ICD-10-CM | POA: Diagnosis present

## 2016-12-15 ENCOUNTER — Encounter: Payer: Self-pay | Admitting: Physical Therapy
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
## 2016-12-21 LAB — GLUCOSE, POCT (MANUAL RESULT ENTRY): POC Glucose: 95 mg/dl (ref 70–99)

## 2016-12-22 ENCOUNTER — Ambulatory Visit (INDEPENDENT_AMBULATORY_CARE_PROVIDER_SITE_OTHER): Payer: Medicaid Other | Admitting: Interventional Cardiology

## 2016-12-22 ENCOUNTER — Encounter (INDEPENDENT_AMBULATORY_CARE_PROVIDER_SITE_OTHER): Payer: Self-pay

## 2016-12-22 ENCOUNTER — Encounter: Payer: Self-pay | Admitting: Interventional Cardiology

## 2016-12-22 VITALS — BP 96/60 | HR 94 | Ht 64.0 in | Wt 214.0 lb

## 2016-12-22 DIAGNOSIS — I25118 Atherosclerotic heart disease of native coronary artery with other forms of angina pectoris: Secondary | ICD-10-CM | POA: Diagnosis not present

## 2016-12-22 DIAGNOSIS — I10 Essential (primary) hypertension: Secondary | ICD-10-CM

## 2016-12-22 DIAGNOSIS — I700 Atherosclerosis of aorta: Secondary | ICD-10-CM

## 2016-12-22 DIAGNOSIS — E7800 Pure hypercholesterolemia, unspecified: Secondary | ICD-10-CM | POA: Diagnosis not present

## 2016-12-22 DIAGNOSIS — M79604 Pain in right leg: Secondary | ICD-10-CM

## 2016-12-22 DIAGNOSIS — M79605 Pain in left leg: Secondary | ICD-10-CM

## 2016-12-22 MED ORDER — ASPIRIN EC 81 MG PO TBEC: 81.0000 mg | DELAYED_RELEASE_TABLET | Freq: Every day | ORAL | 3 refills | Status: DC

## 2016-12-22 NOTE — Progress Notes (Signed)
Patient ID: Jocelyn [name], female   DOB: [birth year], 56 y.o.   MRN: [id number]     Cardiology Office Note   Date:  12/22/2016   ID:  Jocelyn [name], DOB 02-25-1961, MRN [id number]  PCP:  Benito Mccreedy, MD    No chief complaint on file. Follow-up CAD   Wt Readings from Last 3 Encounters:  12/22/16 214 lb (97.1 kg)  10/29/16 200 lb (90.7 kg)  10/19/16 215 lb (97.5 kg)       History of Present Illness: Jocelyn [name] is a 56 y.o. female  who has had coronary artery disease. She had a chronically occluded obtuse marginal noted on catheterization from January 2017.   She continues on her medications.  Her biggest complaints are weight gain, and ankle swelling. She had normal left ventricular function in 2016.  She also reports some fatigue.  SHe feels that she has no energy.  She has no regular exercise program.  Limited by right leg sciatica.  She eats less at restaurants.  She has reduced canned foods as well.    Denies :   Leg edema.  Orthopnea. Palpitations. Paroxysmal nocturnal dyspnea. Syncope.   Reports postural dizziness on occasion, and some chest discomfort if she lifts something too heavy, she can take a NTG for relief.  She reports leg pains when someone touches her legs, or she is applying soap during a bath or using a towel.  Not related to walking.       Past Medical History:  Diagnosis Date  . Arthritis   . Chronic diastolic CHF (congestive heart failure), NYHA class 2 ([place])   . HTN (hypertension)   . Hypercholesterolemia   . Hypothyroidism (acquired)   . Myocardial infarction ([place])   . Thyrotoxicosis   . TIA (transient ischemic attack)     Past Surgical History:  Procedure Laterality Date  . BREAST CYST EXCISION    . CARDIAC CATHETERIZATION N/A 08/24/2015   Procedure: Left Heart Cath and Coronary Angiography;  Surgeon: Jettie Booze, MD;  Location: [facility];  Service: Cardiovascular;  Laterality: N/A;  . CORONARY STENT PLACEMENT  1999    . KNEE SURGERY    . partial hysterec    . THYROIDECTOMY       Current Outpatient Prescriptions  Medication Sig Dispense Refill  . albuterol (PROVENTIL HFA;VENTOLIN HFA) 108 (90 Base) MCG/ACT inhaler Inhale 2 puffs into the lungs every 4 (four) hours as needed for shortness of breath.     . ALPRAZolam (XANAX) 1 MG tablet TAKE 1 TABLET BY MOUTH 3 TIMES A DAY AS NEEDED FOR ANXIETY OR PANIC ATTACKS  1  . aspirin 325 MG tablet Take 325 mg by mouth daily.    . cyclobenzaprine (FLEXERIL) 10 MG tablet Take 10 mg by mouth 3 (three) times daily as needed for muscle spasms.   0  . furosemide (LASIX) 40 MG tablet Take 40 mg by mouth daily.     Marland Kitchen ibuprofen (ADVIL,MOTRIN) 800 MG tablet Take 1 tablet (800 mg total) by mouth 3 (three) times daily. 21 tablet 0  . isosorbide mononitrate (IMDUR) 30 MG 24 hr tablet Take 2 tablets (60 mg total) by mouth daily. 30 tablet 5  . metoprolol tartrate (LOPRESSOR) 25 MG tablet Take 1 tablet (25 mg total) by mouth 2 (two) times daily. 30 tablet 2  . nitroGLYCERIN (NITROSTAT) 0.3 MG SL tablet TAKE 1 TABLET UNDER THE TONGUE EVERY 5 MINUTES AS NEEDED FOR CHEST PAIN  0  . simvastatin (  ZOCOR) 20 MG tablet Take 20 mg by mouth at bedtime.  1  . spironolactone (ALDACTONE) 25 MG tablet Take 25 mg by mouth 2 (two) times daily.  1  . traMADol (ULTRAM) 50 MG tablet Take 1 tablet (50 mg total) by mouth 2 (two) times daily. 28 tablet 0  . triamcinolone cream (KENALOG) 0.5 % Apply 1 application topically daily as needed (for sun exposure).     No current facility-administered medications for this visit.     Allergies:   Hydrocodone    Social History:  The patient  reports that she has been smoking Cigarettes.  She has been smoking about 0.50 packs per day. She has never used smokeless tobacco. She reports that she does not drink alcohol or use drugs.   Family History:  The patient's family history includes Cancer in her father; Heart disease in her father and mother;  Hypertension in her maternal grandmother and mother; Stroke in her brother and mother.    ROS:  Please see the history of present illness.   Otherwise, review of systems are positive for ankle swelling.   All other systems are reviewed and negative.    PHYSICAL EXAM: VS:  BP 96/60 (BP Location: Left Arm, Patient Position: Sitting, Cuff Size: Normal)   Pulse 94   Ht 5\' 4"  (1.626 m)   Wt 214 lb (97.1 kg)   SpO2 94%   BMI 36.73 kg/m  , BMI Body mass index is 36.73 kg/m. GEN: Well nourished, well developed, in no acute distress  HEENT: normal  Neck: no JVD, carotid bruits, or masses Cardiac: RRR; no murmurs, rubs, or gallops,no edema  Respiratory:  clear to auscultation bilaterally, normal work of breathing GI: soft, nontender, nondistended, obese MS: no deformity or atrophy  Skin: warm and dry, no rash Neuro:  Strength and sensation are intact Psych: euthymic mood, full affect    EKG:   The ekg ordered 12/17 demonstrates NSR, no ST segment changes   Recent Labs: 07/16/2016: Hemoglobin 13.4; Platelets 298; TSH 0.195 07/17/2016: B Natriuretic Peptide 53.4; BUN 14; Creatinine, Ser 1.02; Potassium 4.3; Sodium 137   Lipid Panel No results found for: CHOL, TRIG, HDL, CHOLHDL, VLDL, LDLCALC, LDLDIRECT   Other studies Reviewed: Additional studies/ records that were reviewed today with results demonstrating: cath record reviewed, BP screening reviewed showing normal BP.   ASSESSMENT AND PLAN:  1. CAD: Angina well controlled on medical therapy. I stressed the importance of risk factor modification including regular exercise and changing her diet.   2. Hypertension: Well controlled. Continue current medicines. Once weight loss, she may be able to get off of some of her medication. 3. Hyperlipidemia: LDL followed with primary care physician. Continue lipid-lowering therapy. 4. Aortic atherosclerosis: No abdominal aortic aneurysm on prior screening 5. We talked about decreasing  carbohydrate intake on her diet. 6. Leg pain is not from PAD based on her description.    Current medicines are reviewed at length with the patient today.  The patient concerns regarding her medicines were addressed.  The following changes have been made:  No change  Labs/ tests ordered today include:  No orders of the defined types were placed in this encounter.   Recommend 150 minutes/week of aerobic exercise Low fat, low carb, high fiber diet recommended  Disposition:      Signed, Larae Grooms, MD  12/22/2016 3:56 PM    [facility] [place], [place], [place]  [address] Phone: [phone]; Fax: [phone]     

## 2016-12-22 NOTE — Patient Instructions (Signed)
Medication Instructions:  Your physician has recommended you make the following change in your medication:   1) DECREASE Aspirin to 81 mg daily.    Labwork: None ordered  Testing/Procedures: None ordered  Follow-Up: Your physician wants you to follow-up in: 1 year with Dr. Irish Lack. You will receive a reminder letter in the mail two months in advance. If you don't receive a letter, please call our office to schedule the follow-up appointment.   Any Other Special Instructions Will Be Listed Below (If Applicable).     If you need a refill on your cardiac medications before your next appointment, please call your pharmacy.

## 2016-12-28 ENCOUNTER — Ambulatory Visit: Payer: Medicaid Other | Admitting: Physical Therapy

## 2016-12-29 ENCOUNTER — Ambulatory Visit: Payer: Medicaid Other | Admitting: Physical Therapy

## 2016-12-29 ENCOUNTER — Encounter: Payer: Self-pay | Admitting: Physical Therapy

## 2016-12-29 DIAGNOSIS — G8929 Other chronic pain: Secondary | ICD-10-CM

## 2016-12-29 DIAGNOSIS — R293 Abnormal posture: Secondary | ICD-10-CM

## 2016-12-29 DIAGNOSIS — M545 Low back pain: Secondary | ICD-10-CM

## 2016-12-29 DIAGNOSIS — M546 Pain in thoracic spine: Secondary | ICD-10-CM | POA: Diagnosis not present

## 2016-12-29 DIAGNOSIS — M62830 Muscle spasm of back: Secondary | ICD-10-CM

## 2016-12-31 ENCOUNTER — Encounter: Payer: Self-pay | Admitting: Physical Therapy

## 2016-12-31 NOTE — Therapy (Signed)
Jocelyn [name], [place], [address] Phone: [phone]   Fax:  [phone]  Physical Therapy Treatment  Patient Details  Name: Jocelyn [name] MRN: [id number] Date of Birth: August 30, 1960 Referring Provider: Dondra Prader NP   Encounter Date: 12/29/2016      PT End of Session - 12/31/16 2115    Visit Number 2   Number of Visits 16   Date for PT Re-Evaluation 02/09/17   Authorization Type Medicaid but going through insurance    PT Start Time 5176   PT Stop Time 1513   PT Time Calculation (min) 58 min   Activity Tolerance Patient tolerated treatment well   Behavior During Therapy [facility] for tasks assessed/performed      Past Medical History:  Diagnosis Date  . Arthritis   . Chronic diastolic CHF (congestive heart failure), NYHA class 2 ([place])   . HTN (hypertension)   . Hypercholesterolemia   . Hypothyroidism (acquired)   . Myocardial infarction ([place])   . Thyrotoxicosis   . TIA (transient ischemic attack)     Past Surgical History:  Procedure Laterality Date  . BREAST CYST EXCISION    . CARDIAC CATHETERIZATION N/A 08/24/2015   Procedure: Left Heart Cath and Coronary Angiography;  Surgeon: Jettie Booze, MD;  Location: [place] [facility];  Service: Cardiovascular;  Laterality: N/A;  . CORONARY STENT PLACEMENT  1999  . KNEE SURGERY    . partial hysterec    . THYROIDECTOMY      There were no vitals filed for this visit.      Subjective Assessment - 12/31/16 2124    Subjective Patient reported significant pain after the last visit but she was given justlimited activity to do. She reports she was afarid to do her exercises. She has also not been to therapy in 2 weeks. She was advised if she does not come to therapy or do her exercises she likely will not improve.    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    How long can you sit  comfortably? < 15 minutes before she becomes uncomfortable    How long can you stand comfortably? Limited ability to stand for more then a few minutes without pain    How long can you walk comfortably? Pain walking limited community distances    Currently in Pain? Yes   Pain Score 7    Pain Location Thoracic   Pain Orientation Right;Left;Upper;Mid   Pain Descriptors / Indicators Aching   Pain Onset More than a month ago   Pain Frequency Intermittent   Aggravating Factors  prolonged positioning    Pain Relieving Factors flexoril    Effect of Pain on Daily Activities difficulty perfroming daily tasks    Multiple Pain Sites Yes   Pain Score 8   Pain Location Back   Pain Orientation Right;Left   Pain Descriptors / Indicators Aching   Pain Type Chronic pain   Pain Onset More than a month ago   Pain Frequency Constant   Aggravating Factors  standing and walking    Pain Relieving Factors ice, shots, medications    Effect of Pain on Daily Activities difficulty perfroming daily tasks                          [facility] Adult PT Treatment/Exercise - 12/31/16 0001      Lumbar Exercises: Stretches  Active Hamstring Stretch Limitations seated hamstring stretch in pain free range    Lower Trunk Rotation Limitations x10      Lumbar Exercises: Supine   AB Set Limitations x10    Glut Set Limitations x10      Modalities   Modalities Moist Heat;Electrical Stimulation     Moist Heat Therapy   Number Minutes Moist Heat 10 Minutes   Moist Heat Location Lumbar Spine     Electrical Stimulation   Electrical Stimulation Location 10   Electrical Stimulation Action IFC    Electrical Stimulation Parameters to tolerance    Electrical Stimulation Goals Pain     Manual Therapy   Manual therapy comments gentle LE distraction with foot in multpile positions to improve hip movement; Gentler Hip IR and ER; {Gentle posteriro hip mobilizations to improve flexion                    PT Short Term Goals - 12/15/16 0841      PT SHORT TERM GOAL #1   Title Patient will demsotrate fair abdominal contraction    Time 4   Period Weeks   Status New     PT SHORT TERM GOAL #2   Title Patient will be independent with initial HEP for right leg strengthening andcore strengthening    Time 4   Period Weeks   Status New     PT SHORT TERM GOAL #3   Title Patient will increase right gross lower extremity strength to 4/5    Time 4   Period Weeks   Status New           PT Long Term Goals - 12/15/16 0840      PT LONG TERM GOAL #1   Title Patient will transfer to supine or semi-reccumbent without significant pain    Time 8   Period Weeks   Status New     PT LONG TERM GOAL #2   Title Patient will stand for 20 minutes without increased pain in order to perfrom ADL's    Time 8   Period Weeks   Status New     PT LONG TERM GOAL #3   Title Patient will sleep through the nigh twithout increased pain    Time 4   Period Weeks   Status New               Plan - 12/31/16 2123    Clinical Impression Statement Patient has difficulty with guarding her right leg. She reported increased pain with just light movement of her left leg. After light distraction she reported pain behind her knee. After several minutes of manual therapy she stopped gaurding as much. Therapy tried  a light ball roll to improve throacic mobilitly. She tolerated this well. Patient recieved e-stim to her lower back. she was advised she nededs to start moving. She has an extreme fear of mobving that is making her more stiff and weak.    Clinical Presentation Evolving   Clinical Decision Making Moderate   PT Frequency 2x / week   PT Duration 8 weeks   PT Treatment/Interventions ADLs/Self Care Home Management;Cryotherapy;Electrical Stimulation;Iontophoresis 4mg /ml Dexamethasone;Stair training;Gait training;Moist Heat;Ultrasound;Therapeutic activities;Therapeutic exercise;Neuromuscular  re-education;Patient/family education;Passive range of motion;Manual techniques;Dry needling;Taping   PT Next Visit Plan consider manual hip stretching on a wedge; soft tissue work may have to be done in the [employer] chair or sitting.  Work on Chief Strategy Officer; Consdier standing hip flexion, quad set, LAQ; Seated clamshell; Seated  thoracic movements.    PT Home Exercise Plan thoracic pain free movements; hamstring stretch    Consulted and Agree with Plan of Care Patient      Patient will benefit from skilled therapeutic intervention in order to improve the following deficits and impairments:  Decreased activity tolerance, Decreased strength, Decreased mobility, Decreased range of motion, Difficulty walking, Decreased endurance, Increased muscle spasms, Postural dysfunction, Pain  Visit Diagnosis: Pain in thoracic spine  Chronic bilateral low back pain without sciatica  Muscle spasm of back  Abnormal posture     Problem List Patient Active Problem List   Diagnosis Date Noted  . Other spondylosis with radiculopathy, lumbar region 10/19/2016  . Thyroid disease   . Depression with anxiety 07/16/2016  . Chronic diastolic CHF (congestive heart failure) ([place]) 07/16/2016  . Aortic atherosclerosis ([place]) 11/24/2015  . Hypothyroidism (acquired)   . Unstable angina ([place]) 08/21/2015  . Chest pain   . Coronary artery disease   . CAD (coronary artery disease), native coronary artery 01/14/2015  . Hypertension 01/14/2015  . Hypercholesterolemia 01/14/2015  . Tobacco abuse 01/14/2015    Carney Living 12/31/2016, 9:26 PM  [facility] [address] [place], [place], [address] Phone: [phone]   Fax:  [phone]  Name: Morgen Linebaugh MRN: [id number] Date of Birth: 29-Apr-1961

## 2017-01-02 ENCOUNTER — Ambulatory Visit (INDEPENDENT_AMBULATORY_CARE_PROVIDER_SITE_OTHER): Payer: Medicaid Other | Admitting: Family

## 2017-01-02 ENCOUNTER — Ambulatory Visit: Payer: Medicaid Other | Admitting: Physical Therapy

## 2017-01-04 ENCOUNTER — Ambulatory Visit: Payer: Medicaid Other | Attending: Family | Admitting: Physical Therapy

## 2017-01-04 ENCOUNTER — Encounter: Payer: Self-pay | Admitting: Physical Therapy

## 2017-01-04 DIAGNOSIS — R293 Abnormal posture: Secondary | ICD-10-CM | POA: Insufficient documentation

## 2017-01-04 DIAGNOSIS — G8929 Other chronic pain: Secondary | ICD-10-CM | POA: Diagnosis present

## 2017-01-04 DIAGNOSIS — M62830 Muscle spasm of back: Secondary | ICD-10-CM | POA: Diagnosis present

## 2017-01-04 DIAGNOSIS — M545 Low back pain: Secondary | ICD-10-CM | POA: Insufficient documentation

## 2017-01-04 DIAGNOSIS — M546 Pain in thoracic spine: Secondary | ICD-10-CM | POA: Insufficient documentation

## 2017-01-05 NOTE — Therapy (Addendum)
[place], [place], [address] Phone: [phone]   Fax:  [phone]  Physical Therapy Treatment/ Discharge   Patient Details  Name: Jocelyn [name] MRN: [id number] Date of Birth: April 24, 1961 Referring Provider: Dondra Prader NP   Encounter Date: 01/04/2017      PT End of Session - 01/04/17 2127    Visit Number 3   Number of Visits 16   Date for PT Re-Evaluation 02/09/17   Authorization Type Medicaid but going through insurance    PT Start Time 3299   PT Stop Time 1458   PT Time Calculation (min) 43 min   Activity Tolerance Patient tolerated treatment well   Behavior During Therapy [facility] for tasks assessed/performed      Past Medical History:  Diagnosis Date  . Arthritis   . Chronic diastolic CHF (congestive heart failure), NYHA class 2 ([place])   . HTN (hypertension)   . Hypercholesterolemia   . Hypothyroidism (acquired)   . Myocardial infarction ([place])   . Thyrotoxicosis   . TIA (transient ischemic attack)     Past Surgical History:  Procedure Laterality Date  . BREAST CYST EXCISION    . CARDIAC CATHETERIZATION N/A 08/24/2015   Procedure: Left Heart Cath and Coronary Angiography;  Surgeon: Jettie Booze, MD;  Location: [place] [facility];  Service: Cardiovascular;  Laterality: N/A;  . CORONARY STENT PLACEMENT  1999  . KNEE SURGERY    . partial hysterec    . THYROIDECTOMY      There were no vitals filed for this visit.      Subjective Assessment - 01/04/17 1420    Subjective Patient reports her pain is only a 2/10 today but it is only because of the pain medication. She does not feel the therapy is working. Therapy advised her this is only the second visit   Pertinent History significant lumbar pain throught the years, Herat stent palcement which she now states is blocked.    Limitations Standing;Walking;House hold activities   [name] long can you sit comfortably? < 15 minutes before she  becomes uncomfortable    [name] long can you stand comfortably? Limited ability to stand for more then a few minutes without pain    [name] long can you walk comfortably? Pain walking limited community distances    Diagnostic tests Lumbar MRI: L4-L5 L5- S1 Moderate to severe degeneration Thoracic CT: Moderate stenosis    Currently in Pain? Yes   Pain Score 2    Pain Location Back   Pain Orientation Mid;Right   Pain Descriptors / Indicators Aching   Pain Onset More than a month ago   Pain Frequency Intermittent   Aggravating Factors  prolonged psoitioning    Pain Relieving Factors pain medications    Effect of Pain on Daily Activities difficulty perfroming daily tasks                          [facility] Adult PT Treatment/Exercise - 01/05/17 0001      Lumbar Exercises: Stretches   Active Hamstring Stretch Limitations seated hamstring stretch in pain free range    Lower Trunk Rotation Limitations x10      Lumbar Exercises: Supine   AB Set Limitations x10    Other Supine Lumbar Exercises seated clam 2x10 and ball squeeze 2x10      Modalities   Modalities --  declined      Manual Therapy   Manual therapy comments  gentle LE distraction with foot in multpile positions to improve hip movement; Gentler Hip IR and ER; {Gentle posteriro hip mobilizations to improve flexion; trigger point release to lumbar spine and thoracic spine. Moderate spasming felt;  Patient shown [name] to do self trigger point release.                  PT Education - 01/04/17 2126    Education provided Yes   Education Details Improtance of movement    Person(s) Educated Patient   Methods Explanation;Demonstration;Tactile cues;Verbal cues   Comprehension Verbalized understanding;Returned demonstration;Verbal cues required;Tactile cues required          PT Short Term Goals - 12/15/16 0841      PT SHORT TERM GOAL #1   Title Patient will demsotrate fair abdominal contraction    Time 4   Period  Weeks   Status New     PT SHORT TERM GOAL #2   Title Patient will be independent with initial HEP for right leg strengthening andcore strengthening    Time 4   Period Weeks   Status New     PT SHORT TERM GOAL #3   Title Patient will increase right gross lower extremity strength to 4/5    Time 4   Period Weeks   Status New           PT Long Term Goals - 12/15/16 0840      PT LONG TERM GOAL #1   Title Patient will transfer to supine or semi-reccumbent without significant pain    Time 8   Period Weeks   Status New     PT LONG TERM GOAL #2   Title Patient will stand for 20 minutes without increased pain in order to perfrom ADL's    Time 8   Period Weeks   Status New     PT LONG TERM GOAL #3   Title Patient will sleep through the nigh twithout increased pain    Time 4   Period Weeks   Status New               Plan - 01/04/17 2127    Clinical Impression Statement Patient's passive hip flexion mesured at 78 degrees today without significant pain.Her hip and overall movement have improved. She belives it is because of the same pain medications she has been taking for weeks. Her pain was only 2/10. She reports she will come for 1 more visit. She was able to comple hip exercises with core stability. Continue to progress as tolerate.d    Clinical Presentation Evolving   Clinical Decision Making Moderate   Rehab Potential Good   PT Frequency 2x / week   PT Duration 8 weeks   PT Treatment/Interventions ADLs/Self Care Home Management;Cryotherapy;Electrical Stimulation;Iontophoresis 68m/ml Dexamethasone;Stair training;Gait training;Moist Heat;Ultrasound;Therapeutic activities;Therapeutic exercise;Neuromuscular re-education;Patient/family education;Passive range of motion;Manual techniques;Dry needling;Taping   PT Next Visit Plan consider manual hip stretching on a wedge; soft tissue work may have to be done in the [employer] or sitting.  Work on tChief Strategy Officer  Consdier standing hip flexion, quad set, LAQ; Seated clamshell; Seated thoracic movements.    PT Home Exercise Plan thoracic pain free movements; hamstring stretch    Consulted and Agree with Plan of Care Patient      Patient will benefit from skilled therapeutic intervention in order to improve the following deficits and impairments:  Decreased activity tolerance, Decreased strength, Decreased mobility, Decreased range of motion, Difficulty walking, Decreased endurance, Increased muscle  spasms, Postural dysfunction, Pain  Visit Diagnosis: Pain in thoracic spine  Chronic bilateral low back pain without sciatica  Muscle spasm of back  Abnormal posture  PHYSICAL THERAPY DISCHARGE SUMMARY  Visits from Start of Care: 3  Current functional level related to goals / functional outcomes: Did Not come for last visit  Remaining deficits: Did not come for last visit    Education / Equipment: Did not come for last visit   Plan: Patient agrees to discharge.  Patient goals were not met. Patient is being discharged due to not returning since the last visit.  ?????     Problem List Patient Active Problem List   Diagnosis Date Noted  . Other spondylosis with radiculopathy, lumbar region 10/19/2016  . Thyroid disease   . Depression with anxiety 07/16/2016  . Chronic diastolic CHF (congestive heart failure) ([place]) 07/16/2016  . Aortic atherosclerosis ([place]) 11/24/2015  . Hypothyroidism (acquired)   . Unstable angina ([place]) 08/21/2015  . Chest pain   . Coronary artery disease   . CAD (coronary artery disease), native coronary artery 01/14/2015  . Hypertension 01/14/2015  . Hypercholesterolemia 01/14/2015  . Tobacco abuse 01/14/2015    Carney Living PT DPT  01/05/2017, 8:34 AM  [facility] [address] [place], [place], [address] Phone: [phone]   Fax:  [phone]  Name: Jocelyn [name] MRN: [id number] Date of Birth:  [birth year]

## 2017-01-06 ENCOUNTER — Other Ambulatory Visit (INDEPENDENT_AMBULATORY_CARE_PROVIDER_SITE_OTHER): Payer: Self-pay

## 2017-01-06 ENCOUNTER — Telehealth (INDEPENDENT_AMBULATORY_CARE_PROVIDER_SITE_OTHER): Payer: Self-pay | Admitting: Orthopedic Surgery

## 2017-01-06 MED ORDER — CYCLOBENZAPRINE HCL 10 MG PO TABS: 10.0000 mg | ORAL_TABLET | Freq: Three times a day (TID) | ORAL | 0 refills | Status: DC | PRN

## 2017-01-06 NOTE — Telephone Encounter (Signed)
Patient called asking for a refill on her muscle relaxer. CB # [phone]

## 2017-01-06 NOTE — Telephone Encounter (Signed)
Per Jocelyn [name] advised that we can refill her flexeril she has had this in the past.  This was faxed to pharm and called pt to advise.

## 2017-01-09 ENCOUNTER — Ambulatory Visit: Payer: Medicaid Other | Admitting: Physical Therapy

## 2017-01-11 ENCOUNTER — Ambulatory Visit: Payer: Medicaid Other | Admitting: Physical Therapy

## 2017-01-12 ENCOUNTER — Telehealth: Payer: Self-pay | Admitting: Physical Therapy

## 2017-01-12 ENCOUNTER — Ambulatory Visit (INDEPENDENT_AMBULATORY_CARE_PROVIDER_SITE_OTHER): Payer: Medicaid Other | Admitting: Family

## 2017-01-12 DIAGNOSIS — G8929 Other chronic pain: Secondary | ICD-10-CM | POA: Diagnosis not present

## 2017-01-12 DIAGNOSIS — M546 Pain in thoracic spine: Secondary | ICD-10-CM

## 2017-01-12 DIAGNOSIS — M5417 Radiculopathy, lumbosacral region: Secondary | ICD-10-CM | POA: Diagnosis not present

## 2017-01-12 DIAGNOSIS — M5441 Lumbago with sciatica, right side: Secondary | ICD-10-CM

## 2017-01-12 NOTE — Progress Notes (Signed)
Office Visit Note   Patient: Jocelyn [name]           Date of Birth: 07-29-1961           MRN: [id number] Visit Date: 01/12/2017              Requested by: Benito Mccreedy, MD [address] [place], [place] [address] PCP: Benito Mccreedy, MD  Chief Complaint  Patient presents with  . Middle Back - Follow-up      HPI: The patient is a 56 year old woman who presents today in follow-up for mid back pain. She has completed several outpatient physical therapy visits. Feels that they have just made her more sore. Continued mid back pain pain with twisting of the hips and bending over. Has a history of sciatica on the right. Today is complaining of pain radiating down her right buttock and posterior thigh. Does state she has some numbness in her right groin. Denies any weakness or any numbness or tingling in the foot. No heaviness of the right lower extremity. Does endorse having previous injections in her back for the same.  Assessment & Plan: Visit Diagnoses:  1. Lumbosacral radiculopathy   2. Chronic bilateral thoracic back pain   3. Chronic right-sided low back pain with right-sided sciatica     Plan: Will refer to [facility] for sciatica. Recommended further PT or HEP for mid back pain. Recommended against a back brace.   Follow-Up Instructions: Return if symptoms worsen or fail to improve.   Back Exam   Tenderness  The patient is experiencing tenderness in the lumbar and thoracic (pain out of proportion to exam).  Muscle Strength  The patient has normal back strength.      Patient is alert, oriented, no adenopathy, well-dressed, normal affect, normal respiratory effort.   Imaging: No results found.  Labs: Lab Results  Component Value Date   HGBA1C 6.3 (H) 08/22/2015    Orders:  Orders Placed This Encounter  Procedures  . Ambulatory referral to Physical Medicine Rehab   No orders of the defined types were placed in this encounter.    Procedures: No procedures performed  Clinical Data: No additional findings.  ROS:  All other systems negative, except as noted in the HPI. Review of Systems  Constitutional: Negative for chills and fever.  Musculoskeletal: Positive for back pain and myalgias.    Objective: Vital Signs: There were no vitals taken for this visit.  Specialty Comments:  No specialty comments available.  PMFS History: Patient Active Problem List   Diagnosis Date Noted  . Chronic right-sided low back pain with right-sided sciatica 01/12/2017  . Chronic bilateral thoracic back pain 01/12/2017  . Other spondylosis with radiculopathy, lumbar region 10/19/2016  . Thyroid disease   . Depression with anxiety 07/16/2016  . Chronic diastolic CHF (congestive heart failure) ([place]) 07/16/2016  . Aortic atherosclerosis ([place]) 11/24/2015  . Hypothyroidism (acquired)   . Unstable angina ([place]) 08/21/2015  . Chest pain   . Coronary artery disease   . CAD (coronary artery disease), native coronary artery 01/14/2015  . Hypertension 01/14/2015  . Hypercholesterolemia 01/14/2015  . Tobacco abuse 01/14/2015   Past Medical History:  Diagnosis Date  . Arthritis   . Chronic diastolic CHF (congestive heart failure), NYHA class 2 ([place])   . HTN (hypertension)   . Hypercholesterolemia   . Hypothyroidism (acquired)   . Myocardial infarction ([place])   . Thyrotoxicosis   . TIA (transient ischemic attack)     Family  History  Problem Relation Age of Onset  . Heart disease Mother   . Stroke Mother   . Hypertension Mother   . Heart disease Father   . Cancer Father   . Stroke Brother   . Hypertension Maternal Grandmother   . Heart attack Neg Hx     Past Surgical History:  Procedure Laterality Date  . BREAST CYST EXCISION    . CARDIAC CATHETERIZATION N/A 08/24/2015   Procedure: Left Heart Cath and Coronary Angiography;  Surgeon: Jettie Booze, MD;  Location: [place] [facility];  Service: Cardiovascular;   Laterality: N/A;  . CORONARY STENT PLACEMENT  1999  . KNEE SURGERY    . partial hysterec    . THYROIDECTOMY     Social History   Occupational History  . [employer]    Social History Main Topics  . Smoking status: Current Every Day Smoker    Packs/day: 0.50    Types: Cigarettes  . Smokeless tobacco: Never Used  . Alcohol use No  . Drug use: No  . Sexual activity: Not on file

## 2017-01-12 NOTE — Telephone Encounter (Signed)
Spoke with Patient regarding No-show for appointment on 01/11/2017. This is her second no-show appointment. The patient will return to the MD today. She reports she has too many visits to go to. Per policy remaining visit cancelled. She will call back and schedule per MD recommendations.

## 2017-01-16 ENCOUNTER — Encounter: Payer: Medicaid Other | Admitting: Physical Therapy

## 2017-01-18 ENCOUNTER — Ambulatory Visit: Payer: Medicaid Other | Admitting: Physical Therapy

## 2017-01-20 ENCOUNTER — Ambulatory Visit (INDEPENDENT_AMBULATORY_CARE_PROVIDER_SITE_OTHER): Payer: Medicaid Other | Admitting: Physical Medicine and Rehabilitation

## 2017-01-23 ENCOUNTER — Other Ambulatory Visit (INDEPENDENT_AMBULATORY_CARE_PROVIDER_SITE_OTHER): Payer: Self-pay | Admitting: Family

## 2017-02-13 ENCOUNTER — Other Ambulatory Visit (INDEPENDENT_AMBULATORY_CARE_PROVIDER_SITE_OTHER): Payer: Self-pay | Admitting: Family

## 2017-02-15 ENCOUNTER — Telehealth (INDEPENDENT_AMBULATORY_CARE_PROVIDER_SITE_OTHER): Payer: Self-pay | Admitting: Family

## 2017-02-15 ENCOUNTER — Other Ambulatory Visit (INDEPENDENT_AMBULATORY_CARE_PROVIDER_SITE_OTHER): Payer: Self-pay | Admitting: Family

## 2017-02-15 MED ORDER — CYCLOBENZAPRINE HCL 10 MG PO TABS: 10.0000 mg | ORAL_TABLET | Freq: Three times a day (TID) | ORAL | 0 refills | Status: DC | PRN

## 2017-02-15 NOTE — Telephone Encounter (Signed)
Patient called needing Rx refilled (Flexeril) The number to contact patient is [phone]

## 2017-03-02 ENCOUNTER — Telehealth (INDEPENDENT_AMBULATORY_CARE_PROVIDER_SITE_OTHER): Payer: Self-pay | Admitting: Orthopedic Surgery

## 2017-03-02 NOTE — Telephone Encounter (Signed)
Billing printed for patient to pickup at office.

## 2017-03-11 IMAGING — CR DG HIP (WITH OR WITHOUT PELVIS) 2-3V*L*
3 series · 3 of 3 positions shown · non-contrast
Comparison: None.

CLINICAL DATA: Fall down stairs tonight, left hip pain.

EXAM:
DG HIP (WITH OR WITHOUT PELVIS) 2-3V LEFT

[pelvis ap]
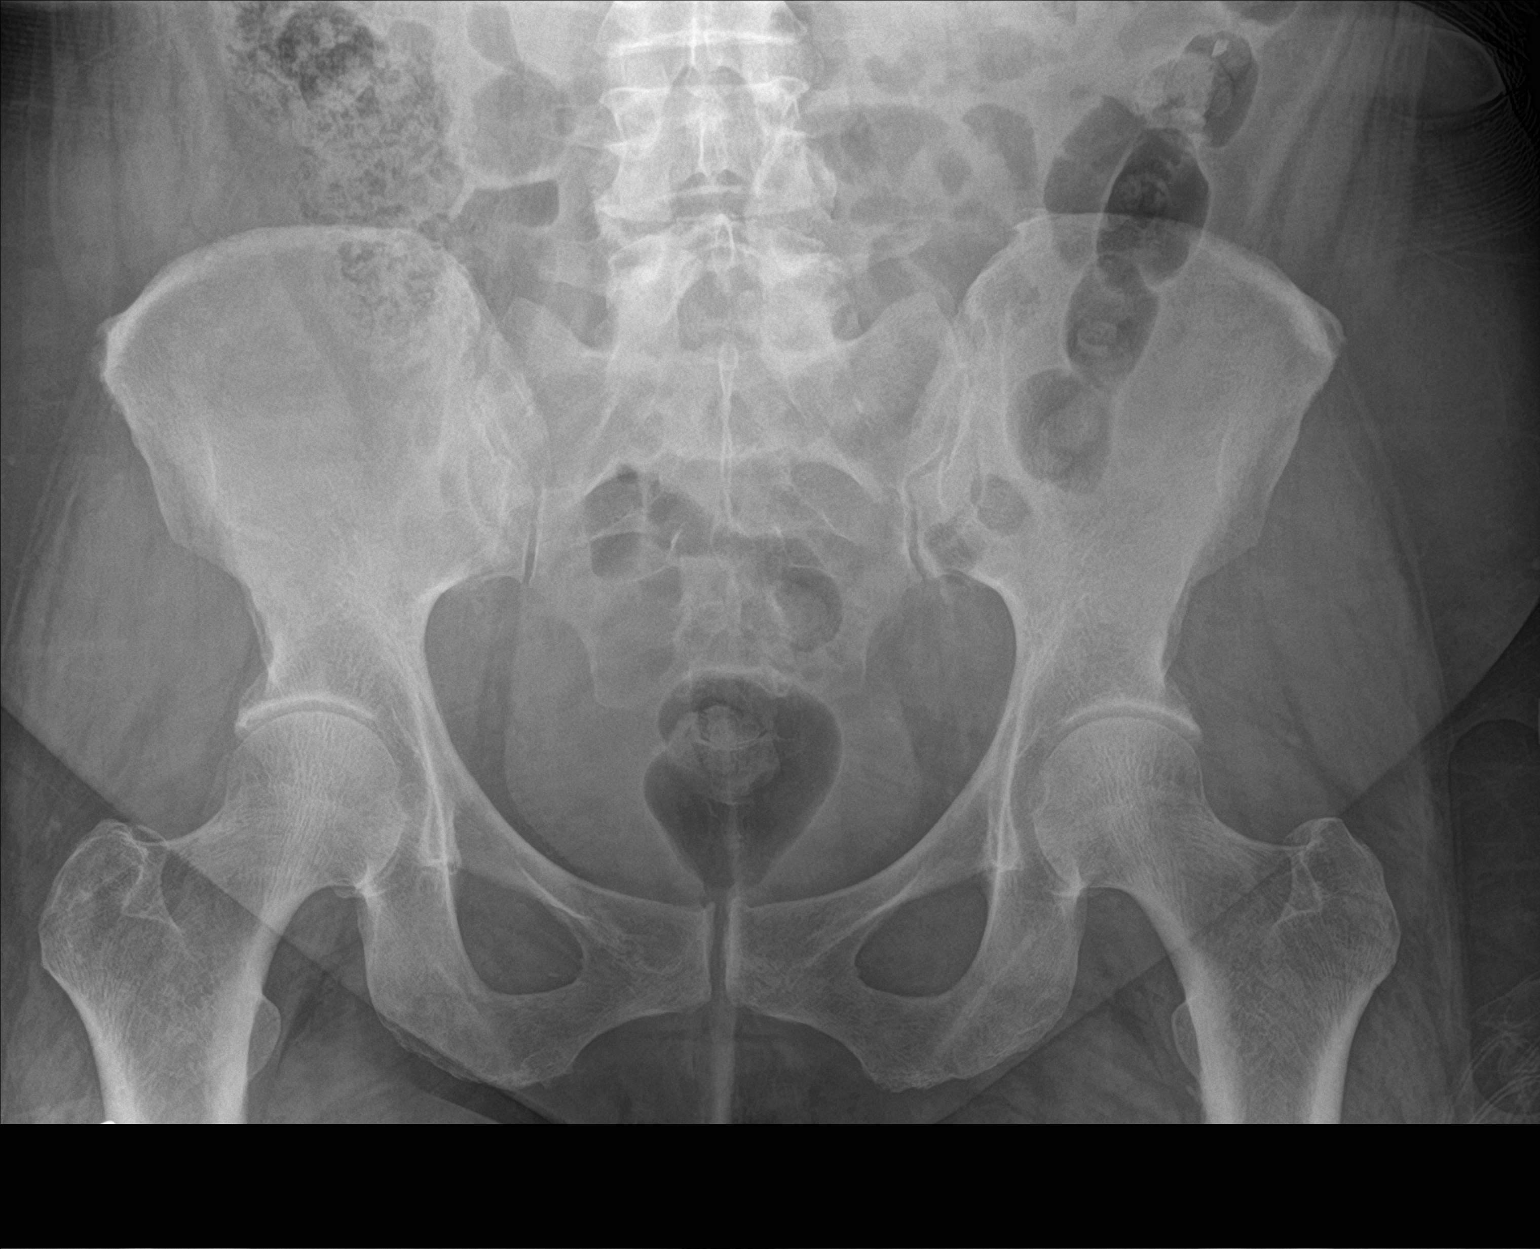

[hip ap]
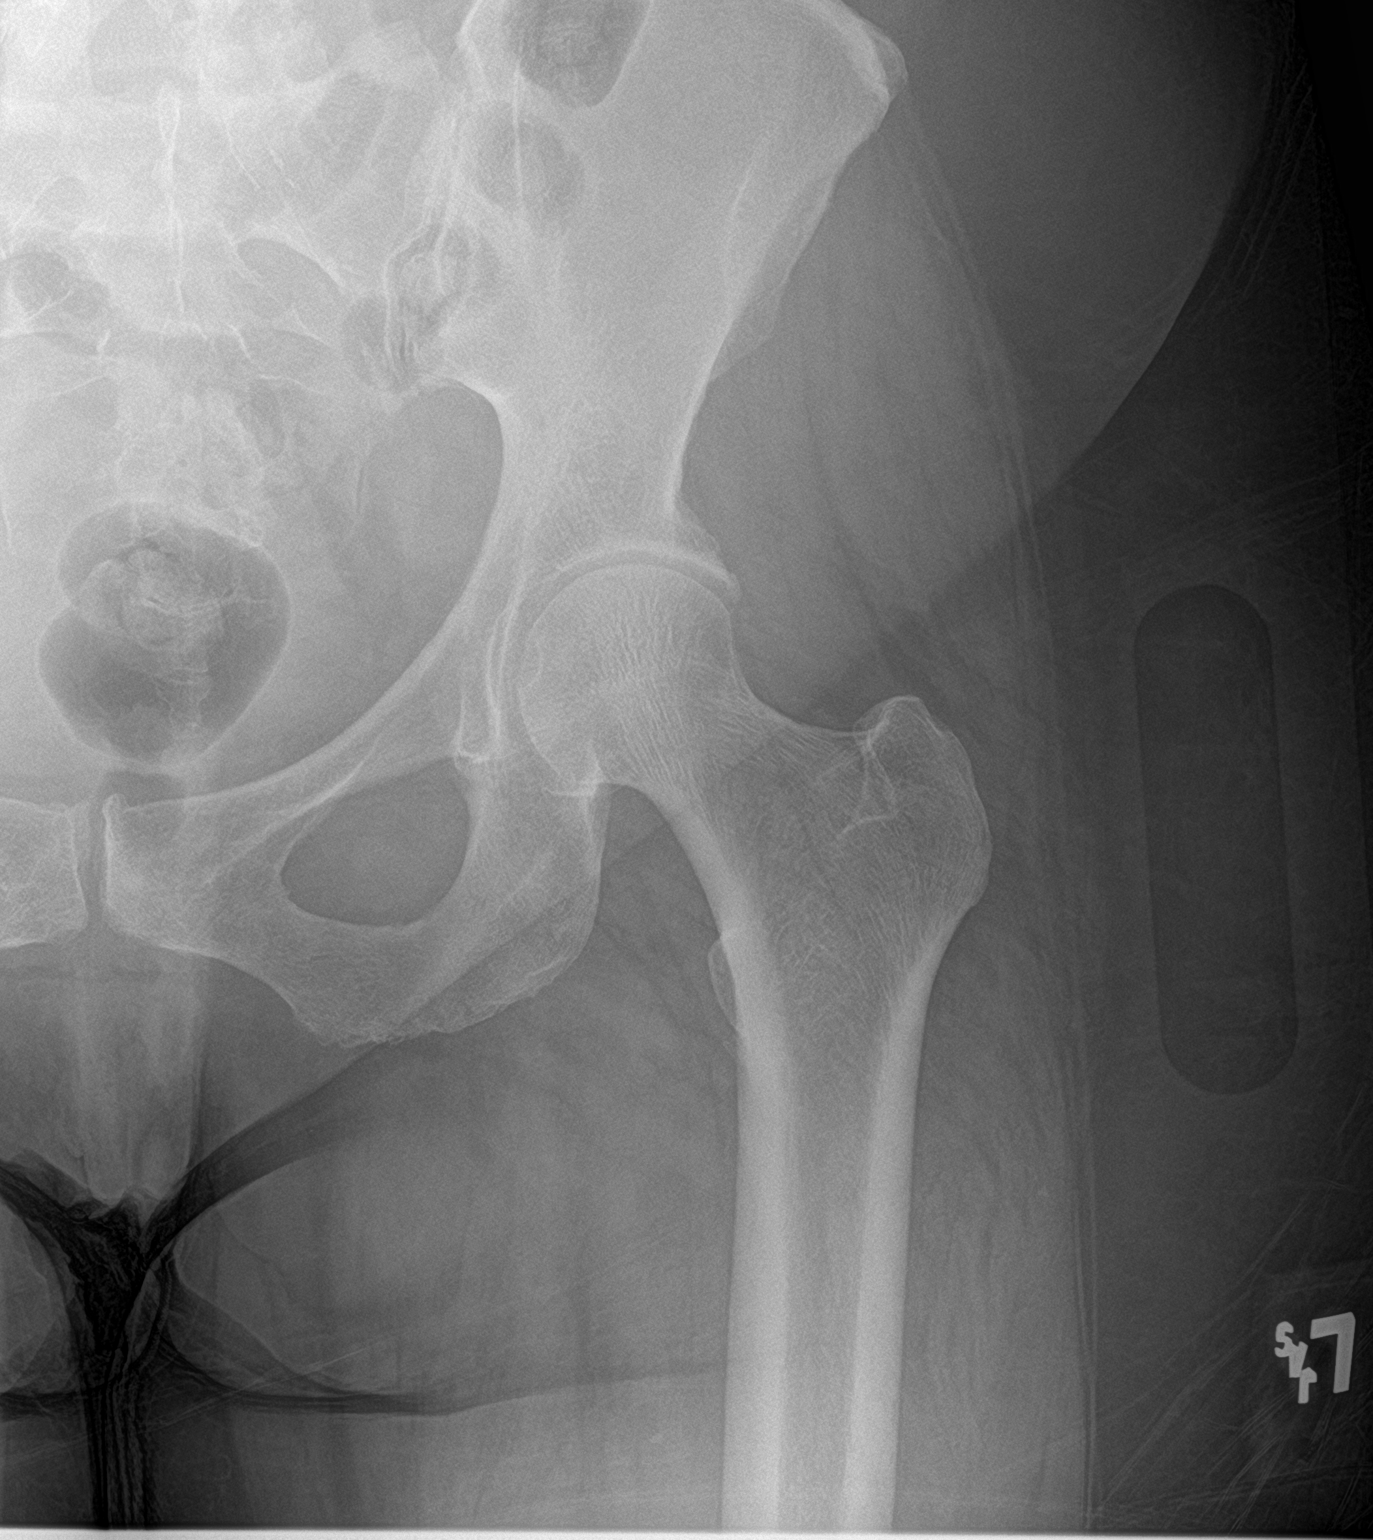

[hip lat]
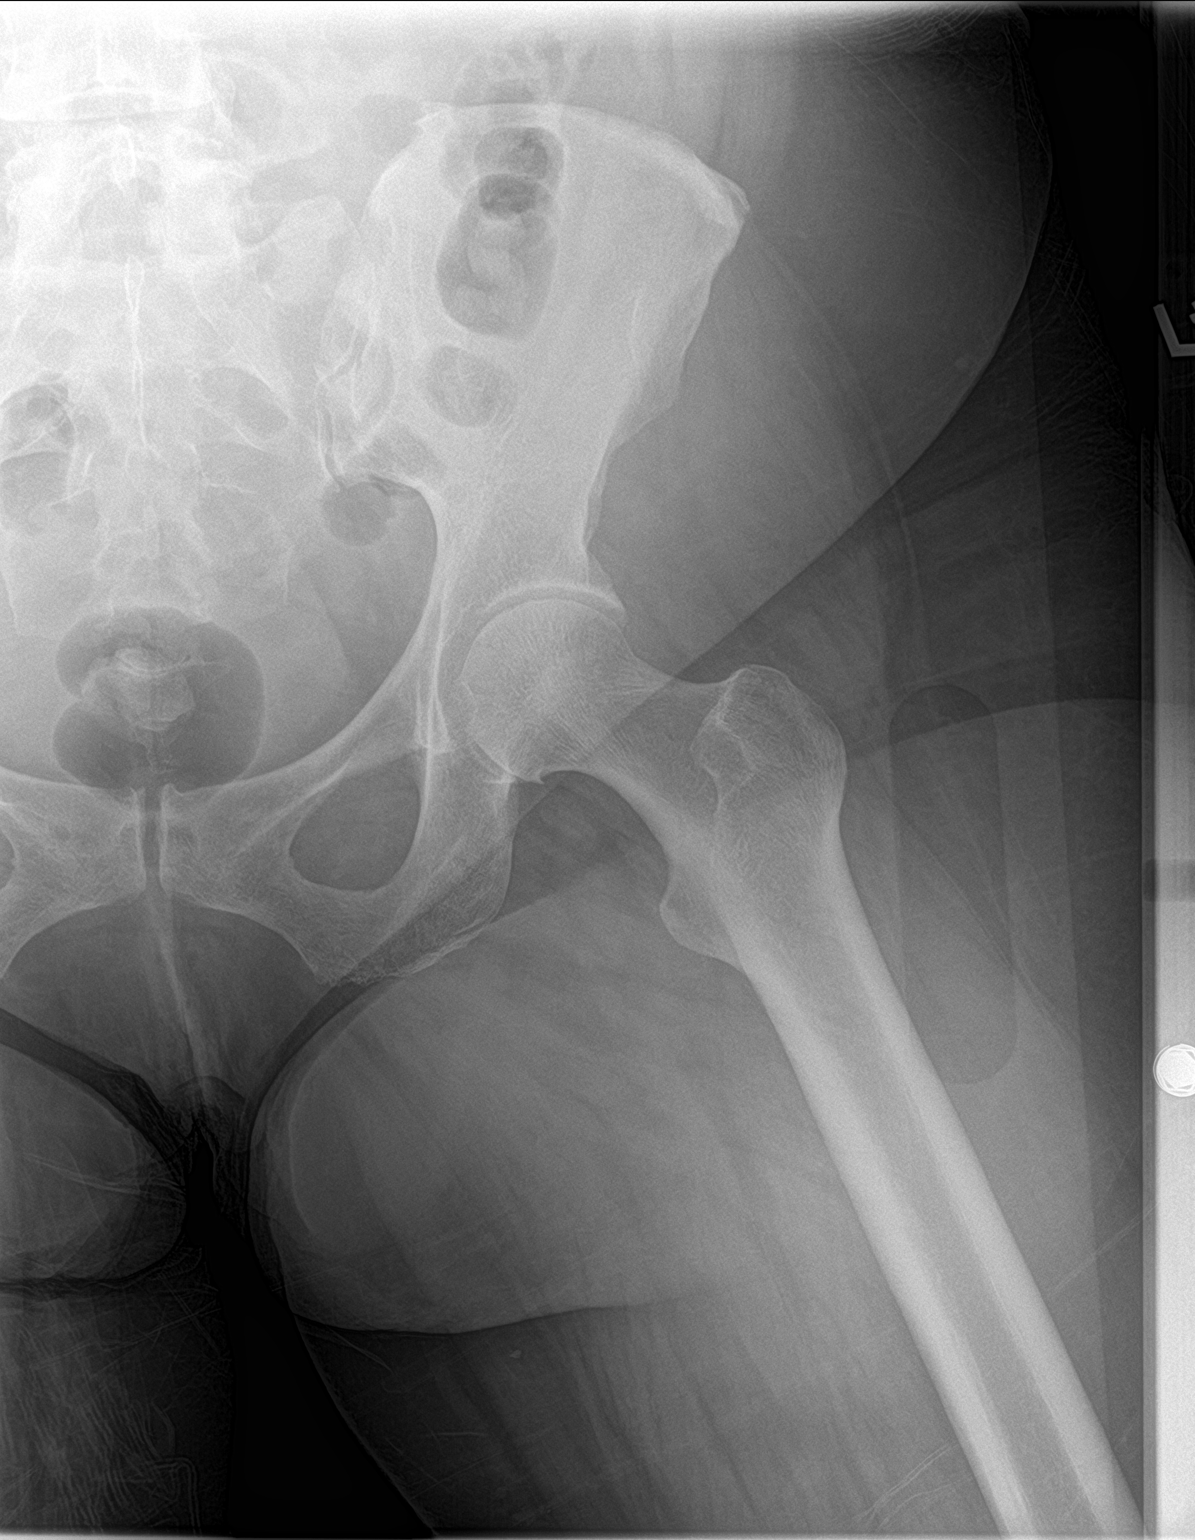

[3 of 3 positions shown; findings below may reference images not displayed]

FINDINGS: The cortical margins of the bony pelvis and left hip are intact. No
fracture. Pubic symphysis and sacroiliac joints are congruent. Both
femoral heads are well-seated in the respective acetabula. Minimal
acetabular and femoral head spurring.
IMPRESSION: No fracture or subluxation of the pelvis or left hip.

## 2017-03-24 ENCOUNTER — Telehealth (INDEPENDENT_AMBULATORY_CARE_PROVIDER_SITE_OTHER): Payer: Self-pay | Admitting: Physical Medicine and Rehabilitation

## 2017-03-27 NOTE — Telephone Encounter (Signed)
Patient called back. I called and left message #2.

## 2017-03-28 NOTE — Telephone Encounter (Signed)
Returning your call. States she was wondering if she could get in tomorrow; I told her I really didn't think, but I would tell her you called

## 2017-03-28 NOTE — Telephone Encounter (Signed)
Left message #3

## 2017-03-29 ENCOUNTER — Other Ambulatory Visit (INDEPENDENT_AMBULATORY_CARE_PROVIDER_SITE_OTHER): Payer: Self-pay | Admitting: Family

## 2017-03-29 MED ORDER — CYCLOBENZAPRINE HCL 10 MG PO TABS: 10.0000 mg | ORAL_TABLET | Freq: Three times a day (TID) | ORAL | 0 refills | Status: DC | PRN

## 2017-03-29 NOTE — Telephone Encounter (Signed)
I called and spoke with the patient advised that they cannot fill stronger flexeril. Also recommended strongly against two flexeril.

## 2017-03-29 NOTE — Telephone Encounter (Signed)
I called and spoke with patient advised flexeril was sent in, nothing stronger, and cannot take two at a time.

## 2017-04-10 ENCOUNTER — Ambulatory Visit (INDEPENDENT_AMBULATORY_CARE_PROVIDER_SITE_OTHER): Payer: Medicaid Other | Admitting: Physical Medicine and Rehabilitation

## 2017-04-10 ENCOUNTER — Ambulatory Visit (INDEPENDENT_AMBULATORY_CARE_PROVIDER_SITE_OTHER): Payer: Self-pay

## 2017-04-10 ENCOUNTER — Telehealth (INDEPENDENT_AMBULATORY_CARE_PROVIDER_SITE_OTHER): Payer: Self-pay

## 2017-04-10 ENCOUNTER — Encounter (INDEPENDENT_AMBULATORY_CARE_PROVIDER_SITE_OTHER): Payer: Self-pay | Admitting: Physical Medicine and Rehabilitation

## 2017-04-10 VITALS — BP 108/69 | HR 89

## 2017-04-10 DIAGNOSIS — M461 Sacroiliitis, not elsewhere classified: Secondary | ICD-10-CM | POA: Diagnosis not present

## 2017-04-10 NOTE — Telephone Encounter (Signed)
Patient seen today for Si joint injection with Dr. Ernestina Patches and she is requesting a refill on Flexeril.

## 2017-04-10 NOTE — Progress Notes (Signed)
Jocelyn [name] - 56 y.o. female MRN [id number]  Date of birth: [birth year]  Office Visit Note: Visit Date: 04/10/2017 PCP: Jocelyn Mccreedy, MD Referred by: Jocelyn Mccreedy, MD  Subjective: No chief complaint on file.  HPI: Jocelyn [name] is a 56 year old female that we seen in the past for lumbar facet joint injections. 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 Last injection performed at the facet joint really didn't give her any relief at all and she elicits she became ill after the injection. She comes in today for possible injection of the right sacroiliac joint. She is having more right buttock pain and low back pain that refers to the thigh. She is also having mid back pain of the upper lumbar lower thoracic region. She does not have much insight into realizing these are necessarily related from a spine standpoint. Imaging of the lumbar spine is mainly showed facet arthritis particularly at L4-5 and L5-S1. She reports constant pain worse with walking. She gets numbness in the leg.    ROS Otherwise per HPI.  Assessment & Plan: Visit Diagnoses:  1. Sacroiliitis (Jocelyn [name])     Plan: Findings:  Right sacroiliac joint injection fluoroscopic guidance. Depending on the relief she gets with this she'll follow-up with Jocelyn [name] for further evaluation and management. I'm not sure the interventions are point ultimately help her in the long run. She reports to me today wanting to have a solution to her back pain and why her pain continues. She has questions concerning TENS unit and lumbar braces or corsets. She might benefit from a lumbar brace that was fitted for her to wear only at times or she's active. We have probably reached a point where injections or not be limited usefulness in terms of long-term progress. They may aid in diagnosis.     Meds & Orders: No orders of the defined types were placed in this encounter.   Orders Placed This Encounter  Procedures  . Large  Joint Injection/Arthrocentesis  . XR C-ARM NO REPORT    Follow-up: Return in about 2 weeks (around 04/24/2017) for Jocelyn [name], Jocelyn [name], [place].   Procedures: Sacroiliac joint injection with fluoroscopic guidance Date/Time: 04/10/2017 2:26 PM Performed by: Jocelyn [name] Authorized by: Jocelyn [name]   Consent [name] by:  Patient Site marked: the procedure site was marked   Timeout: prior to procedure the correct patient, procedure, and site was verified   Indications:  Pain and diagnostic evaluation Location:  Sacroiliac Site:  R sacroiliac joint Prep: patient was prepped and draped in usual sterile fashion   Needle Size:  22 G Needle Length:  3.5 inches Approach:  Posterior Ultrasound Guidance: No   Fluoroscopic Guidance: Yes   Arthrogram: No   Medications:  2 mL bupivacaine 0.5 %; 80 mg methylPREDNISolone acetate 80 MG/ML Aspiration Attempted: No   Patient tolerance:  Patient tolerated the procedure well with no immediate complications  There was excellent flow of contrast producing a partial arthrogram of the sacroiliac joint.      No notes on file   Clinical History: Lumbar spine MRI 07/11/2016 IMPRESSION: 1. Dominant degenerative finding in the lumbar spine is facet arthropathy which is moderate to severe at L4-L5 and L5-S1. Favor facet disease is the source of the pain symptoms. Superimposed mild edema in the superior right L5 facet compatible with acute exacerbation of the chronic facet joint arthritis. 2. No lumbar disc herniation. No lumbar spinal or lateral recess stenosis despite mild epidural lipomatosis.  There is mild bilateral L4 neural foraminal stenosis. 3. Lower thoracic disc, endplate, and posterior element degeneration. Moderate right T11, and mild right T12 neural foraminal stenosis.  She reports that she has been smoking Cigarettes.  She has been smoking about 0.50 packs per day. She has never used smokeless tobacco. No results for input(s): HGBA1C,  LABURIC in the last 8760 hours.  Objective:  VS:  HT:    WT:   BMI:     BP:108/69  HR:89bpm  TEMP: ( )  RESP:  Physical Exam  Musculoskeletal:  Patient has a positive Fortin finger sign on the right. She has an equivocal Patrick's test on the right. She is tender all across the lower back and mid back as well as tenderness across lumbosacral junction and greater trochanters. She has good distal strength.    Ortho Exam Imaging: Xr C-arm No Report  Result Date: 04/10/2017 Please see Notes or Procedures tab for imaging impression.   Past Medical/Family/Surgical/Social History: Medications & Allergies reviewed per EMR Patient Active Problem List   Diagnosis Date Noted  . Chronic right-sided low back pain with right-sided sciatica 01/12/2017  . Chronic bilateral thoracic back pain 01/12/2017  . Other spondylosis with radiculopathy, lumbar region 10/19/2016  . Thyroid disease   . Depression with anxiety 07/16/2016  . Chronic diastolic CHF (congestive heart failure) ([place]) 07/16/2016  . Aortic atherosclerosis ([place]) 11/24/2015  . Hypothyroidism (acquired)   . Unstable angina ([place]) 08/21/2015  . Chest pain   . Coronary artery disease   . CAD (coronary artery disease), native coronary artery 01/14/2015  . Hypertension 01/14/2015  . Hypercholesterolemia 01/14/2015  . Tobacco abuse 01/14/2015   Past Medical History:  Diagnosis Date  . Arthritis   . Chronic diastolic CHF (congestive heart failure), NYHA class 2 ([place])   . HTN (hypertension)   . Hypercholesterolemia   . Hypothyroidism (acquired)   . Myocardial infarction ([place])   . Thyrotoxicosis   . TIA (transient ischemic attack)    Family History  Problem Relation Age of Onset  . Heart disease Mother   . Stroke Mother   . Hypertension Mother   . Heart disease Father   . Cancer Father   . Stroke Brother   . Hypertension Maternal Grandmother   . Heart attack Neg Hx    Past Surgical History:  Procedure Laterality Date    . BREAST CYST EXCISION    . CARDIAC CATHETERIZATION N/A 08/24/2015   Procedure: Left Heart Cath and Coronary Angiography;  Surgeon: Jocelyn Booze, MD;  Location: [facility];  Service: Cardiovascular;  Laterality: N/A;  . CORONARY STENT PLACEMENT  1999  . KNEE SURGERY    . partial hysterec    . THYROIDECTOMY     Social History   Occupational History  . [employer]    Social History Main Topics  . Smoking status: Current Every Day Smoker    Packs/day: 0.50    Types: Cigarettes  . Smokeless tobacco: Never Used  . Alcohol use No  . Drug use: No  . Sexual activity: Not on file

## 2017-04-10 NOTE — Progress Notes (Deleted)
Right side lower back into buttock. Radiates down back of right leg to knee. Constant pain. Worse with walking. States buttock feels numb constant. Requesting a refill on Flexeril.

## 2017-04-11 ENCOUNTER — Telehealth (INDEPENDENT_AMBULATORY_CARE_PROVIDER_SITE_OTHER): Payer: Self-pay

## 2017-04-11 MED ORDER — BUPIVACAINE HCL 0.5 % IJ SOLN
2.0000 mL | INTRAMUSCULAR | Status: AC | PRN
  Administered 2017-04-10: 2 mL via INTRA_ARTICULAR

## 2017-04-11 MED ORDER — METHYLPREDNISOLONE ACETATE 80 MG/ML IJ SUSP
80.0000 mg | INTRAMUSCULAR | Status: AC | PRN
  Administered 2017-04-10: 80 mg via INTRA_ARTICULAR

## 2017-04-11 NOTE — Telephone Encounter (Signed)
Talked with patient and advised her of message, but she would like to know if another Rx can be prescribed for her back pain.. Stated that she is not able to take Hydrocodone or Oxycodone.  CB# is [phone].  Please advise.  Thank You.

## 2017-04-11 NOTE — Patient Instructions (Signed)

## 2017-04-11 NOTE — Telephone Encounter (Signed)
Talked with patient and advised her of message per Dondra Prader concerning a refill for Flexeril.

## 2017-04-12 MED ORDER — NABUMETONE 750 MG PO TABS: 750.0000 mg | ORAL_TABLET | Freq: Two times a day (BID) | ORAL | 0 refills | Status: AC

## 2017-04-12 MED ORDER — TRAMADOL HCL 50 MG PO TABS: 50.0000 mg | ORAL_TABLET | Freq: Two times a day (BID) | ORAL | 0 refills | Status: DC

## 2017-04-12 NOTE — Addendum Note (Signed)
Addended by: Maxcine Ham on: 04/12/2017 04:41 PM   Modules accepted: Orders

## 2017-04-12 NOTE — Telephone Encounter (Signed)
Call patient. We can: Relafen 750 mg twice a day to be used as needed for pain.

## 2017-04-12 NOTE — Telephone Encounter (Signed)
Advised patient we could send in relafen to pharmacy, she request tramadol as well. Ok per Dr. Sharol Given advised that relafen would be safer. She expressed understanding. And this was called into her pharmacy [facility] [address].

## 2017-04-24 ENCOUNTER — Ambulatory Visit (INDEPENDENT_AMBULATORY_CARE_PROVIDER_SITE_OTHER): Payer: Medicaid Other | Admitting: Orthopedic Surgery

## 2017-04-24 ENCOUNTER — Encounter (INDEPENDENT_AMBULATORY_CARE_PROVIDER_SITE_OTHER): Payer: Self-pay | Admitting: Orthopedic Surgery

## 2017-04-24 DIAGNOSIS — M461 Sacroiliitis, not elsewhere classified: Secondary | ICD-10-CM | POA: Diagnosis not present

## 2017-04-24 MED ORDER — CYCLOBENZAPRINE HCL 10 MG PO TABS: 10.0000 mg | ORAL_TABLET | Freq: Three times a day (TID) | ORAL | 0 refills | Status: DC | PRN

## 2017-04-24 NOTE — Progress Notes (Signed)
Office Visit Note   Patient: Jocelyn [name]           Date of Birth: 1961/04/18           MRN: [id number] Visit Date: 04/24/2017              Requested by: Benito Mccreedy, MD [address] [place], [place] [address] PCP: Benito Mccreedy, MD  Chief Complaint  Patient presents with  . Middle Back - Follow-up      HPI: Patient is a 56 year old woman with inflammation of the sacroiliac joint on the right. Patient states that she has tried physical therapy in the past but stopped after a few visits stating that she did not notice improvement. Patient states she is not taking the Relafen because she has taken aspirin status post 3 myocardial infarctions with stent placement. She states that she is currently taking tramadol and Flexeril. Pain primarily directly at the sacroiliac joint on the right. She underwent injection with Dr. Ernestina Patches about 2 weeks ago and she states she is still having persistent pain. She states she is tired of shots she wants to know if there is something can be taken care of without surgery.  Assessment & Plan: Visit Diagnoses:  1. Sacroiliitis, not elsewhere classified [facility])     Plan: Recommended physical therapy this was sent to [facility] outpatient therapy to call her. She is given a refill prescription for Flexeril discussed the importance of strengthening relieve stress on the sacroiliac joint area do not feel that any surgical intervention would be warranted.  Follow-Up Instructions: Return if symptoms worsen or fail to improve.   Ortho Exam  Patient is alert, oriented, no adenopathy, well-dressed, normal affect, normal respiratory effort. Examination patient has an antalgic gait. She has a negative straight leg raise bilaterally. She has no focal motor weakness in either lower extremity. She is point tender to palpation over the sacroiliac joint on the right. She denies any radicular symptoms.  Imaging: No results found. No images are  attached to the encounter.  Labs: Lab Results  Component Value Date   HGBA1C 6.3 (H) 08/22/2015    Orders:  Orders Placed This Encounter  Procedures  . Ambulatory referral to Physical Therapy   Meds ordered this encounter  Medications  . cyclobenzaprine (FLEXERIL) 10 MG tablet    Sig: Take 1 tablet (10 mg total) by mouth 3 (three) times daily as needed for muscle spasms.    Dispense:  30 tablet    Refill:  0     Procedures: No procedures performed  Clinical Data: No additional findings.  ROS:  All other systems negative, except as noted in the HPI. Review of Systems  Objective: Vital Signs: There were no vitals taken for this visit.  Specialty Comments:  No specialty comments available.  PMFS History: Patient Active Problem List   Diagnosis Date Noted  . Sacroiliitis, not elsewhere classified ([place]) 04/24/2017  . Chronic right-sided low back pain with right-sided sciatica 01/12/2017  . Chronic bilateral thoracic back pain 01/12/2017  . Other spondylosis with radiculopathy, lumbar region 10/19/2016  . Thyroid disease   . Depression with anxiety 07/16/2016  . Chronic diastolic CHF (congestive heart failure) ([place]) 07/16/2016  . Aortic atherosclerosis ([place]) 11/24/2015  . Hypothyroidism (acquired)   . Unstable angina ([place]) 08/21/2015  . Chest pain   . Coronary artery disease   . CAD (coronary artery disease), native coronary artery 01/14/2015  . Hypertension 01/14/2015  . Hypercholesterolemia 01/14/2015  .  Tobacco abuse 01/14/2015   Past Medical History:  Diagnosis Date  . Arthritis   . Chronic diastolic CHF (congestive heart failure), NYHA class 2 ([place])   . HTN (hypertension)   . Hypercholesterolemia   . Hypothyroidism (acquired)   . Myocardial infarction ([place])   . Thyrotoxicosis   . TIA (transient ischemic attack)     Family History  Problem Relation Age of Onset  . Heart disease Mother   . Stroke Mother   . Hypertension Mother   . Heart disease  Father   . Cancer Father   . Stroke Brother   . Hypertension Maternal Grandmother   . Heart attack Neg Hx     Past Surgical History:  Procedure Laterality Date  . BREAST CYST EXCISION    . CARDIAC CATHETERIZATION N/A 08/24/2015   Procedure: Left Heart Cath and Coronary Angiography;  Surgeon: Jettie Booze, MD;  Location: [facility];  Service: Cardiovascular;  Laterality: N/A;  . CORONARY STENT PLACEMENT  1999  . KNEE SURGERY    . partial hysterec    . THYROIDECTOMY     Social History   Occupational History  . [employer]    Social History Main Topics  . Smoking status: Current Every Day Smoker    Packs/day: 0.50    Types: Cigarettes  . Smokeless tobacco: Never Used  . Alcohol use No  . Drug use: No  . Sexual activity: Not on file

## 2017-05-01 ENCOUNTER — Ambulatory Visit: Payer: Medicare Other | Admitting: Physical Therapy

## 2017-05-02 ENCOUNTER — Encounter: Payer: Self-pay | Admitting: Physical Therapy

## 2017-05-02 ENCOUNTER — Ambulatory Visit: Payer: Medicare Other | Attending: Orthopedic Surgery | Admitting: Physical Therapy

## 2017-05-02 DIAGNOSIS — M62830 Muscle spasm of back: Secondary | ICD-10-CM | POA: Diagnosis not present

## 2017-05-02 DIAGNOSIS — M546 Pain in thoracic spine: Secondary | ICD-10-CM | POA: Insufficient documentation

## 2017-05-02 DIAGNOSIS — M6281 Muscle weakness (generalized): Secondary | ICD-10-CM | POA: Insufficient documentation

## 2017-05-02 DIAGNOSIS — M79604 Pain in right leg: Secondary | ICD-10-CM | POA: Diagnosis not present

## 2017-05-02 DIAGNOSIS — R293 Abnormal posture: Secondary | ICD-10-CM | POA: Insufficient documentation

## 2017-05-02 DIAGNOSIS — M545 Low back pain: Secondary | ICD-10-CM | POA: Insufficient documentation

## 2017-05-02 DIAGNOSIS — G8929 Other chronic pain: Secondary | ICD-10-CM | POA: Insufficient documentation

## 2017-05-02 DIAGNOSIS — R262 Difficulty in walking, not elsewhere classified: Secondary | ICD-10-CM | POA: Diagnosis not present
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
## 2017-05-10 ENCOUNTER — Ambulatory Visit: Payer: Medicare Other | Admitting: Physical Therapy

## 2017-05-10 ENCOUNTER — Encounter: Payer: Self-pay | Admitting: Physical Therapy

## 2017-05-10 DIAGNOSIS — M79604 Pain in right leg: Secondary | ICD-10-CM | POA: Diagnosis not present

## 2017-05-10 DIAGNOSIS — G8929 Other chronic pain: Secondary | ICD-10-CM | POA: Diagnosis not present

## 2017-05-10 DIAGNOSIS — M6281 Muscle weakness (generalized): Secondary | ICD-10-CM | POA: Diagnosis not present

## 2017-05-10 DIAGNOSIS — M546 Pain in thoracic spine: Secondary | ICD-10-CM

## 2017-05-10 DIAGNOSIS — M62830 Muscle spasm of back: Secondary | ICD-10-CM | POA: Diagnosis not present

## 2017-05-10 DIAGNOSIS — R262 Difficulty in walking, not elsewhere classified: Secondary | ICD-10-CM | POA: Diagnosis not present

## 2017-05-10 DIAGNOSIS — M545 Low back pain: Secondary | ICD-10-CM | POA: Diagnosis not present

## 2017-05-10 DIAGNOSIS — R293 Abnormal posture: Secondary | ICD-10-CM | POA: Diagnosis not present

## 2017-05-10 NOTE — Patient Instructions (Signed)
Abdominal isometrics. Several times a day with good posture\Press both or single hand/hands into thighs hold for 1 breath out relax and repeat 5-10 x

## 2017-05-10 NOTE — Therapy (Signed)
[place], [place], [address] Phone: [phone]   Fax:  [phone]  Physical Therapy Treatment  Patient Details  Name: Jocelyn [name] MRN: [id number] Date of Birth: April 18, 1961 Referring Provider: Newt Minion, MD  Encounter Date: 05/10/2017      PT End of Session - 05/10/17 1745    Visit Number 2   Number of Visits 13   Date for PT Re-Evaluation 06/16/17   PT Start Time 1194  Patient 10 minutes late   PT Stop Time 1500   PT Time Calculation (min) 35 min   Activity Tolerance Patient tolerated treatment well   Behavior During Therapy [facility] for tasks assessed/performed      Past Medical History:  Diagnosis Date  . Arthritis   . Chronic diastolic CHF (congestive heart failure), NYHA class 2 ([place])   . HTN (hypertension)   . Hypercholesterolemia   . Hypothyroidism (acquired)   . Myocardial infarction ([place])   . Thyrotoxicosis   . TIA (transient ischemic attack)     Past Surgical History:  Procedure Laterality Date  . BREAST CYST EXCISION    . CARDIAC CATHETERIZATION N/A 08/24/2015   Procedure: Left Heart Cath and Coronary Angiography;  Surgeon: Jettie Booze, MD;  Location: [place] [facility];  Service: Cardiovascular;  Laterality: N/A;  . CORONARY STENT PLACEMENT  1999  . KNEE SURGERY    . partial hysterec    . THYROIDECTOMY      There were no vitals filed for this visit.      Subjective Assessment - 05/10/17 1429    Subjective I am about th same.  I try to sit in the car with good posture.  I want to exercise so I can do things.    Currently in Pain? Yes   Pain Score 5    Pain Location Back   Pain Orientation Left;Right;Lower;Mid   Pain Descriptors / Indicators Aching   Pain Type Chronic pain   Aggravating Factors  longer positions,  activities longer   Pain Relieving Factors pain meds   Effect of Pain on Daily Activities difficulty with ADL            [facility] PT  Assessment - 05/10/17 0001      Strength   Overall Strength Comments Patient able to walk without assistive devices,  however tested weaker than her function   Right Hip Flexion 2+/5  unable to do a SLR,    Right Hip Extension 2+/5   Right Hip ABduction 3+/5   Left Hip Flexion 2+/5  limited by pain   Left Hip Extension --  Unable to lift leg off mat?  did not check for hip flex ROM   Left Hip ABduction 3/5   Left Hip ADduction 3/5                     [facility] Adult PT Treatment/Exercise - 05/10/17 0001      Lumbar Exercises: Aerobic   Stationary Bike Nu step 7 minutes,  L5,  UE/LE     Lumbar Exercises: Supine   Ab Set 5 reps   Other Supine Lumbar Exercises transverse abdominus sitting with hand press into thighs for isometric abdominals and with single hands for obliques.  10 xeach ,   cued for techniques   Other Supine Lumbar Exercises a b set sitting with pillow squeeze     Knee/Hip Exercises: Standing   Forward Step Up 1 set;10 reps;Hand Hold:  2;Step Height: 4"  each     Knee/Hip Exercises: Seated   Sit to Sand 5 reps  from higher mat                PT Education - 05/10/17 1750    Education Details HEP   Methods Explanation;Verbal cues   Comprehension Verbalized understanding;Returned demonstration          PT Short Term Goals - 12/15/16 0841      PT SHORT TERM GOAL #1   Title Patient will demsotrate fair abdominal contraction    Time 4   Period Weeks   Status New     PT SHORT TERM GOAL #2   Title Patient will be independent with initial HEP for right leg strengthening andcore strengthening    Time 4   Period Weeks   Status New     PT SHORT TERM GOAL #3   Title Patient will increase right gross lower extremity strength to 4/5    Time 4   Period Weeks   Status New           PT Long Term Goals - 05/02/17 1802      PT LONG TERM GOAL #1   Title Pt will be able to perform bed mobility without increase in LBP   Baseline  significant pain at eval, began educating on log roll   Time 6   Period Weeks   Status New   Target Date 06/16/17     PT LONG TERM GOAL #2   Title Patient will stand for 30 minutes without increased pain in order to perfrom ADL's    Baseline 15 min at eval   Time 6   Period Weeks   Status New   Target Date 06/16/17     PT LONG TERM GOAL #3   Title Resolution of pain into lower extremities   Baseline pain in posterior right thigh at eval   Time 6   Period Weeks   Status New   Target Date 06/16/17     PT LONG TERM GOAL #4   Title Pt will verbalize at least 50% improvement in ability to complete self care and household chores, requiring fewer rest breaks due to pain   Baseline frequent breaks at eval   Time 6   Period Weeks   Status New   Target Date 06/16/17               Plan - 05/10/17 1746    Clinical Impression Statement Shorter session due to patient was late.  Exercise focus today.  patient was interested in all the exercises.  MMT, see flow sheet,  Hips tested weaker than what she presents functionally.  No pain increased at the ensd of the session.     PT Treatment/Interventions ADLs/Self Care Home Management;Cryotherapy;Electrical Stimulation;Iontophoresis 4mg /ml Dexamethasone;Stair training;Gait training;Moist Heat;Ultrasound;Therapeutic activities;Therapeutic exercise;Neuromuscular re-education;Patient/family education;Passive range of motion;Manual techniques;Dry needling;Taping;Functional mobility training;Balance training   PT Next Visit Plan review abdominal contraction, nu step with UE & LE for core,    PT Home Exercise Plan posterior pelvic tilt in supine & seated, log roll for bed mobility.  Abdominal isometrics/ obliques (no handout)   Consulted and Agree with Plan of Care Patient      Patient will benefit from skilled therapeutic intervention in order to improve the following deficits and impairments:  Difficulty walking, Obesity, Increased muscle  spasms, Decreased activity tolerance, Pain, Improper body mechanics, Impaired flexibility, Decreased strength, Postural dysfunction  Visit Diagnosis:  Chronic bilateral low back pain, with sciatica presence unspecified  Pain in right leg  Muscle weakness (generalized)  Difficulty in walking, not elsewhere classified  Pain in thoracic spine     Problem List Patient Active Problem List   Diagnosis Date Noted  . Sacroiliitis, not elsewhere classified ([place]) 04/24/2017  . Chronic right-sided low back pain with right-sided sciatica 01/12/2017  . Chronic bilateral thoracic back pain 01/12/2017  . Other spondylosis with radiculopathy, lumbar region 10/19/2016  . Thyroid disease   . Depression with anxiety 07/16/2016  . Chronic diastolic CHF (congestive heart failure) ([place]) 07/16/2016  . Aortic atherosclerosis ([place]) 11/24/2015  . Hypothyroidism (acquired)   . Unstable angina ([place]) 08/21/2015  . Chest pain   . Coronary artery disease   . CAD (coronary artery disease), native coronary artery 01/14/2015  . Hypertension 01/14/2015  . Hypercholesterolemia 01/14/2015  . Tobacco abuse 01/14/2015    Ronold Hardgrove PTA 05/10/2017, 5:51 PM  [facility] [address] [place], [place], [address] Phone: [phone]   Fax:  [phone]  Name: Jocelyn [name] MRN: [id number] Date of Birth: [birth year]

## 2017-05-15 ENCOUNTER — Telehealth: Payer: Self-pay | Admitting: Physical Therapy

## 2017-05-15 ENCOUNTER — Ambulatory Visit: Payer: Medicare Other | Admitting: Physical Therapy

## 2017-05-15 NOTE — Telephone Encounter (Signed)
Called patient due to not arriving. Marland Kitchen  She was pulling into the parking lot, 28 minutes late.  Appointment was cancelled.  She was reminded of her appointment tomorrow at 3:00. She had difficulty reaching the front office by phone to check appointment time for today. Melvenia Needles PTA

## 2017-05-16 ENCOUNTER — Ambulatory Visit: Payer: Medicare Other | Admitting: Physical Therapy

## 2017-05-16 ENCOUNTER — Encounter: Payer: Self-pay | Admitting: Physical Therapy

## 2017-05-16 DIAGNOSIS — R293 Abnormal posture: Secondary | ICD-10-CM | POA: Diagnosis not present

## 2017-05-16 DIAGNOSIS — G8929 Other chronic pain: Secondary | ICD-10-CM

## 2017-05-16 DIAGNOSIS — M545 Low back pain: Principal | ICD-10-CM

## 2017-05-16 DIAGNOSIS — M6281 Muscle weakness (generalized): Secondary | ICD-10-CM

## 2017-05-16 DIAGNOSIS — M546 Pain in thoracic spine: Secondary | ICD-10-CM | POA: Diagnosis not present

## 2017-05-16 DIAGNOSIS — M62830 Muscle spasm of back: Secondary | ICD-10-CM

## 2017-05-16 DIAGNOSIS — R262 Difficulty in walking, not elsewhere classified: Secondary | ICD-10-CM

## 2017-05-16 DIAGNOSIS — M79604 Pain in right leg: Secondary | ICD-10-CM

## 2017-05-16 NOTE — Therapy (Signed)
[place], [place], [address] Phone: [phone]   Fax:  [phone]  Physical Therapy Treatment  Patient Details  Name: Jocelyn [name] MRN: [id number] Date of Birth: May 27, 1961 Referring Provider: Newt Minion, MD  Encounter Date: 05/16/2017      PT End of Session - 05/16/17 1800    Visit Number 3   Number of Visits 13   Date for PT Re-Evaluation 06/16/17   PT Start Time 1501   PT Stop Time 1545   PT Time Calculation (min) 44 min   Activity Tolerance Patient tolerated treatment well   Behavior During Therapy [facility] for tasks assessed/performed      Past Medical History:  Diagnosis Date  . Arthritis   . Chronic diastolic CHF (congestive heart failure), NYHA class 2 ([place])   . HTN (hypertension)   . Hypercholesterolemia   . Hypothyroidism (acquired)   . Myocardial infarction ([place])   . Thyrotoxicosis   . TIA (transient ischemic attack)     Past Surgical History:  Procedure Laterality Date  . BREAST CYST EXCISION    . CARDIAC CATHETERIZATION N/A 08/24/2015   Procedure: Left Heart Cath and Coronary Angiography;  Surgeon: Jettie Booze, MD;  Location: [place] [facility];  Service: Cardiovascular;  Laterality: N/A;  . CORONARY STENT PLACEMENT  1999  . KNEE SURGERY    . partial hysterec    . THYROIDECTOMY      There were no vitals filed for this visit.      Subjective Assessment - 05/16/17 1756    Subjective I flared pain when I felt the spirit in church.  i did not feel the pain at the time,  it was later.    Currently in Pain? Yes   Pain Score 6    Pain Location Back   Pain Orientation Right;Left;Mid;Lower   Pain Descriptors / Indicators Aching   Pain Type Chronic pain   Pain Frequency Intermittent   Aggravating Factors  "Feeling the spirit"  in church   Pain Relieving Factors meds,   Effect of Pain on Daily Activities ADL difficult                         [facility]  Adult PT Treatment/Exercise - 05/16/17 0001      Self-Care   Self-Care ADL's;Lifting;Posture   Lifting golfer's lift,  squat to lift.     Therapeutic Activites    Therapeutic Activities Other Therapeutic Activities   Other Therapeutic Activities ADL simulation, lift practice,  Laundry simulation,  drawer pulling open,  min and low drawer heights,   sit to stand practiced.       Lumbar Exercises: Aerobic   Stationary Bike Nu step  minutes,  L5,  UE/LE                PT Education - 05/16/17 1506    Education provided Yes   Education Details ADL's   Person(s) Educated Patient   Methods Explanation;Demonstration;Verbal cues;Handout   Comprehension Verbalized understanding;Returned demonstration;Need further instruction          PT Short Term Goals - 12/15/16 0841      PT SHORT TERM GOAL #1   Title Patient will demsotrate fair abdominal contraction    Time 4   Period Weeks   Status New     PT SHORT TERM GOAL #2   Title Patient will be independent with initial HEP for right leg strengthening andcore  strengthening    Time 4   Period Weeks   Status New     PT SHORT TERM GOAL #3   Title Patient will increase right gross lower extremity strength to 4/5    Time 4   Period Weeks   Status New           PT Long Term Goals - 05/02/17 1802      PT LONG TERM GOAL #1   Title Pt will be able to perform bed mobility without increase in LBP   Baseline significant pain at eval, began educating on log roll   Time 6   Period Weeks   Status New   Target Date 06/16/17     PT LONG TERM GOAL #2   Title Patient will stand for 30 minutes without increased pain in order to perfrom ADL's    Baseline 15 min at eval   Time 6   Period Weeks   Status New   Target Date 06/16/17     PT LONG TERM GOAL #3   Title Resolution of pain into lower extremities   Baseline pain in posterior right thigh at eval   Time 6   Period Weeks   Status New   Target Date 06/16/17     PT  LONG TERM GOAL #4   Title Pt will verbalize at least 50% improvement in ability to complete self care and household chores, requiring fewer rest breaks due to pain   Baseline frequent breaks at eval   Time 6   Period Weeks   Status New   Target Date 06/16/17               Plan - 05/16/17 1800    Clinical Impression Statement Entier session spent on Ther activities or/ and ADL education.  patient was interested , asked questions and was engaged.  Lifting from lower levels require modifications.    PT Treatment/Interventions ADLs/Self Care Home Management;Cryotherapy;Electrical Stimulation;Iontophoresis 4mg /ml Dexamethasone;Stair training;Gait training;Moist Heat;Ultrasound;Therapeutic activities;Therapeutic exercise;Neuromuscular re-education;Patient/family education;Passive range of motion;Manual techniques;Dry needling;Taping;Functional mobility training;Balance training   PT Next Visit Plan review abdominal contraction, nu step with UE & LE for core,   Answer any body mechanics questions,  practice as needed.     PT Home Exercise Plan posterior pelvic tilt in supine & seated, log roll for bed mobility.  Abdominal isometrics/ obliques (no handout)   Consulted and Agree with Plan of Care Patient      Patient will benefit from skilled therapeutic intervention in order to improve the following deficits and impairments:  Difficulty walking, Obesity, Increased muscle spasms, Decreased activity tolerance, Pain, Improper body mechanics, Impaired flexibility, Decreased strength, Postural dysfunction  Visit Diagnosis: Chronic bilateral low back pain, with sciatica presence unspecified  Pain in right leg  Muscle weakness (generalized)  Difficulty in walking, not elsewhere classified  Pain in thoracic spine  Chronic bilateral low back pain without sciatica  Muscle spasm of back  Abnormal posture     Problem List Patient Active Problem List   Diagnosis Date Noted  .  Sacroiliitis, not elsewhere classified ([place]) 04/24/2017  . Chronic right-sided low back pain with right-sided sciatica 01/12/2017  . Chronic bilateral thoracic back pain 01/12/2017  . Other spondylosis with radiculopathy, lumbar region 10/19/2016  . Thyroid disease   . Depression with anxiety 07/16/2016  . Chronic diastolic CHF (congestive heart failure) ([place]) 07/16/2016  . Aortic atherosclerosis ([place]) 11/24/2015  . Hypothyroidism (acquired)   . Unstable angina ([place]) 08/21/2015  .  Chest pain   . Coronary artery disease   . CAD (coronary artery disease), native coronary artery 01/14/2015  . Hypertension 01/14/2015  . Hypercholesterolemia 01/14/2015  . Tobacco abuse 01/14/2015    Kyan Giannone PTA 05/16/2017, 6:03 PM  [facility] [address] [place], [place], [address] Phone: [phone]   Fax:  [phone]  Name: Jocelyn [name] MRN: [id number] Date of Birth: August 23, 1960

## 2017-05-16 NOTE — Patient Instructions (Signed)

## 2017-05-23 ENCOUNTER — Ambulatory Visit: Payer: Medicare Other | Admitting: Physical Therapy

## 2017-05-25 ENCOUNTER — Ambulatory Visit: Payer: Medicare Other | Admitting: Physical Therapy

## 2017-05-25 ENCOUNTER — Encounter: Payer: Self-pay | Admitting: Physical Therapy

## 2017-05-25 DIAGNOSIS — M79604 Pain in right leg: Secondary | ICD-10-CM | POA: Diagnosis not present

## 2017-05-25 DIAGNOSIS — M62830 Muscle spasm of back: Secondary | ICD-10-CM | POA: Diagnosis not present

## 2017-05-25 DIAGNOSIS — R262 Difficulty in walking, not elsewhere classified: Secondary | ICD-10-CM

## 2017-05-25 DIAGNOSIS — M546 Pain in thoracic spine: Secondary | ICD-10-CM

## 2017-05-25 DIAGNOSIS — M545 Low back pain: Secondary | ICD-10-CM | POA: Diagnosis not present

## 2017-05-25 DIAGNOSIS — R293 Abnormal posture: Secondary | ICD-10-CM | POA: Diagnosis not present

## 2017-05-25 DIAGNOSIS — G8929 Other chronic pain: Secondary | ICD-10-CM

## 2017-05-25 DIAGNOSIS — M6281 Muscle weakness (generalized): Secondary | ICD-10-CM

## 2017-05-25 NOTE — Therapy (Signed)
[place], [place], [address] Phone: [phone]   Fax:  [phone]  Physical Therapy Treatment  Patient Details  Name: Jocelyn [name] MRN: [id number] Date of Birth: 10-18-1960 Referring Provider: Newt Minion, MD  Encounter Date: 05/25/2017      PT End of Session - 05/25/17 1551    Visit Number 4   Number of Visits 13   Date for PT Re-Evaluation 06/16/17   PT Start Time 1501   PT Stop Time 1545   PT Time Calculation (min) 44 min   Activity Tolerance Patient tolerated treatment well   Behavior During Therapy [facility] for tasks assessed/performed      Past Medical History:  Diagnosis Date  . Arthritis   . Chronic diastolic CHF (congestive heart failure), NYHA class 2 ([place])   . HTN (hypertension)   . Hypercholesterolemia   . Hypothyroidism (acquired)   . Myocardial infarction ([place])   . Thyrotoxicosis   . TIA (transient ischemic attack)     Past Surgical History:  Procedure Laterality Date  . BREAST CYST EXCISION    . CARDIAC CATHETERIZATION N/A 08/24/2015   Procedure: Left Heart Cath and Coronary Angiography;  Surgeon: Jettie Booze, MD;  Location: [facility];  Service: Cardiovascular;  Laterality: N/A;  . CORONARY STENT PLACEMENT  1999  . KNEE SURGERY    . partial hysterec    . THYROIDECTOMY      There were no vitals filed for this visit.      Subjective Assessment - 05/25/17 1505    Subjective I have been under physical stress , mental stress and spirituat.  Nothing seems to go right today.  i haven't taken any pain meds today.   Currently in Pain? Yes   Pain Score 7    Pain Location Back   Pain Orientation Right;Left;Mid;Lower   Pain Descriptors / Indicators Aching;Spasm   Pain Type Chronic pain   Pain Radiating Towards up back to bra line   Pain Frequency Constant   Aggravating Factors  stress   Pain Relieving Factors heat,  meds,   Effect of Pain on Daily  Activities ADL diddicult                         [facility] Adult PT Treatment/Exercise - 05/25/17 0001      Transfers   Comments cued for technique on mat,  did better getting off.      Lumbar Exercises: Stretches   Double Knee to Chest Stretch Limitations 10 x 5 seconds , legs on ball   Lower Trunk Rotation --  10 x legs on ball,  small guarded movements.    Lower Trunk Rotation Limitations legs on ball,  small guarded motion.     Lumbar Exercises: Supine   Ab Set 5 reps   AB Set Limitations cued for breathing   Large Ball Abdominal Isometric 10 reps   Large Ball Oblique Isometric 10 reps   Large Ball Oblique Isometric Limitations cued breathing.   Other Supine Lumbar Exercises Yoga trunk rotation. 3 x 10 seconds.     Moist Heat Therapy   Number Minutes Moist Heat 15 Minutes   Moist Heat Location Lumbar Spine     Electrical Stimulation   Electrical Stimulation Location 15   Electrical Stimulation Action IFC   Electrical Stimulation Parameters 8   Electrical Stimulation Goals Pain  PT Short Term Goals - 12/15/16 0841      PT SHORT TERM GOAL #1   Title Patient will demsotrate fair abdominal contraction    Time 4   Period Weeks   Status New     PT SHORT TERM GOAL #2   Title Patient will be independent with initial HEP for right leg strengthening andcore strengthening    Time 4   Period Weeks   Status New     PT SHORT TERM GOAL #3   Title Patient will increase right gross lower extremity strength to 4/5    Time 4   Period Weeks   Status New           PT Long Term Goals - 05/02/17 1802      PT LONG TERM GOAL #1   Title Pt will be able to perform bed mobility without increase in LBP   Baseline significant pain at eval, began educating on log roll   Time 6   Period Weeks   Status New   Target Date 06/16/17     PT LONG TERM GOAL #2   Title Patient will stand for 30 minutes without increased pain in order to perfrom  ADL's    Baseline 15 min at eval   Time 6   Period Weeks   Status New   Target Date 06/16/17     PT LONG TERM GOAL #3   Title Resolution of pain into lower extremities   Baseline pain in posterior right thigh at eval   Time 6   Period Weeks   Status New   Target Date 06/16/17     PT LONG TERM GOAL #4   Title Pt will verbalize at least 50% improvement in ability to complete self care and household chores, requiring fewer rest breaks due to pain   Baseline frequent breaks at eval   Time 6   Period Weeks   Status New   Target Date 06/16/17               Plan - 05/25/17 1552    Clinical Impression Statement Paient depressed today.  Things in life have been bad.   Emotional positive support given as well as a gentle exercise program.  She left with no to minimal pain.    PT Treatment/Interventions ADLs/Self Care Home Management;Cryotherapy;Electrical Stimulation;Iontophoresis 4mg /ml Dexamethasone;Stair training;Gait training;Moist Heat;Ultrasound;Therapeutic activities;Therapeutic exercise;Neuromuscular re-education;Patient/family education;Passive range of motion;Manual techniques;Dry needling;Taping;Functional mobility training;Balance training   PT Next Visit Plan review abdominal contraction, nu step with UE & LE for core,   Answer any body mechanics questions,  practice as needed.     PT Home Exercise Plan posterior pelvic tilt in supine & seated, log roll for bed mobility.  Abdominal isometrics/ obliques (no handout)   Consulted and Agree with Plan of Care Patient      Patient will benefit from skilled therapeutic intervention in order to improve the following deficits and impairments:     Visit Diagnosis: Chronic bilateral low back pain, with sciatica presence unspecified  Pain in right leg  Muscle weakness (generalized)  Difficulty in walking, not elsewhere classified  Pain in thoracic spine  Chronic bilateral low back pain without sciatica  Muscle spasm of  back  Abnormal posture     Problem List Patient Active Problem List   Diagnosis Date Noted  . Sacroiliitis, not elsewhere classified ([place]) 04/24/2017  . Chronic right-sided low back pain with right-sided sciatica 01/12/2017  . Chronic bilateral thoracic back pain  01/12/2017  . Other spondylosis with radiculopathy, lumbar region 10/19/2016  . Thyroid disease   . Depression with anxiety 07/16/2016  . Chronic diastolic CHF (congestive heart failure) ([place]) 07/16/2016  . Aortic atherosclerosis ([place]) 11/24/2015  . Hypothyroidism (acquired)   . Unstable angina ([place]) 08/21/2015  . Chest pain   . Coronary artery disease   . CAD (coronary artery disease), native coronary artery 01/14/2015  . Hypertension 01/14/2015  . Hypercholesterolemia 01/14/2015  . Tobacco abuse 01/14/2015    HARRIS,KAREN PTA 05/25/2017, 3:55 PM  [facility] [address] [place], [place], [address] Phone: [phone]   Fax:  [phone]  Name: Jocelyn [name] MRN: [id number] Date of Birth: 12/15/1960

## 2017-05-29 ENCOUNTER — Encounter: Payer: Self-pay | Admitting: Physical Therapy

## 2017-05-29 ENCOUNTER — Ambulatory Visit: Payer: Medicare Other | Admitting: Physical Therapy

## 2017-05-29 DIAGNOSIS — R262 Difficulty in walking, not elsewhere classified: Secondary | ICD-10-CM | POA: Diagnosis not present

## 2017-05-29 DIAGNOSIS — R293 Abnormal posture: Secondary | ICD-10-CM | POA: Diagnosis not present

## 2017-05-29 DIAGNOSIS — G8929 Other chronic pain: Secondary | ICD-10-CM

## 2017-05-29 DIAGNOSIS — M546 Pain in thoracic spine: Secondary | ICD-10-CM | POA: Diagnosis not present

## 2017-05-29 DIAGNOSIS — M6281 Muscle weakness (generalized): Secondary | ICD-10-CM | POA: Diagnosis not present

## 2017-05-29 DIAGNOSIS — M545 Low back pain: Secondary | ICD-10-CM | POA: Diagnosis not present

## 2017-05-29 DIAGNOSIS — M79604 Pain in right leg: Secondary | ICD-10-CM | POA: Diagnosis not present

## 2017-05-29 DIAGNOSIS — M62830 Muscle spasm of back: Secondary | ICD-10-CM | POA: Diagnosis not present
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
## 2017-05-31 ENCOUNTER — Encounter: Payer: Self-pay | Admitting: Physical Therapy

## 2017-05-31 ENCOUNTER — Ambulatory Visit: Payer: Medicare Other | Admitting: Physical Therapy

## 2017-05-31 ENCOUNTER — Telehealth (INDEPENDENT_AMBULATORY_CARE_PROVIDER_SITE_OTHER): Payer: Self-pay | Admitting: Radiology

## 2017-05-31 DIAGNOSIS — M79604 Pain in right leg: Secondary | ICD-10-CM

## 2017-05-31 DIAGNOSIS — M546 Pain in thoracic spine: Secondary | ICD-10-CM | POA: Diagnosis not present

## 2017-05-31 DIAGNOSIS — M62830 Muscle spasm of back: Secondary | ICD-10-CM | POA: Diagnosis not present

## 2017-05-31 DIAGNOSIS — M545 Low back pain: Secondary | ICD-10-CM | POA: Diagnosis not present

## 2017-05-31 DIAGNOSIS — R262 Difficulty in walking, not elsewhere classified: Secondary | ICD-10-CM

## 2017-05-31 DIAGNOSIS — M6281 Muscle weakness (generalized): Secondary | ICD-10-CM | POA: Diagnosis not present

## 2017-05-31 DIAGNOSIS — G8929 Other chronic pain: Secondary | ICD-10-CM | POA: Diagnosis not present

## 2017-05-31 DIAGNOSIS — R293 Abnormal posture: Secondary | ICD-10-CM

## 2017-05-31 NOTE — Therapy (Signed)
[place], [place], [address] Phone: [phone]   Fax:  [phone]  Physical Therapy Treatment  Patient Details  Name: Jocelyn [name] MRN: [id number] Date of Birth: 18-Nov-1960 Referring Provider: Newt Minion, MD  Encounter Date: 05/31/2017      PT End of Session - 05/31/17 1548    Visit Number 6   Number of Visits 13   Date for PT Re-Evaluation 06/16/17   PT Start Time 1503   PT Stop Time 1542   PT Time Calculation (min) 39 min   Activity Tolerance Patient tolerated treatment well   Behavior During Therapy [facility] for tasks assessed/performed      Past Medical History:  Diagnosis Date  . Arthritis   . Chronic diastolic CHF (congestive heart failure), NYHA class 2 ([place])   . HTN (hypertension)   . Hypercholesterolemia   . Hypothyroidism (acquired)   . Myocardial infarction ([place])   . Thyrotoxicosis   . TIA (transient ischemic attack)     Past Surgical History:  Procedure Laterality Date  . BREAST CYST EXCISION    . CARDIAC CATHETERIZATION N/A 08/24/2015   Procedure: Left Heart Cath and Coronary Angiography;  Surgeon: Jettie Booze, MD;  Location: [place] [facility];  Service: Cardiovascular;  Laterality: N/A;  . CORONARY STENT PLACEMENT  1999  . KNEE SURGERY    . partial hysterec    . THYROIDECTOMY      There were no vitals filed for this visit.      Subjective Assessment - 05/31/17 1544    Subjective 5/10.  It is a good day.     Pain Score 5    Pain Location Back   Pain Orientation Right;Lower                         [facility] Adult PT Treatment/Exercise - 05/31/17 0001      Self-Care   Self-Care Other Self-Care Comments   Other Self-Care Comments  HOW to use home TENS,  precautions,  contraindicTIONS,  do, don'ts,  skin care,  electrode placement,  care.  Pins too loose in original package,  ours issued,  fit better.       Lumbar Exercises: Standing   Wall  Slides Limitations 3 X painful left knee,  with modifications.  so stopped.   Other Standing Lumbar Exercises abdominal bracing withstanding,  ,,   Other Standing Lumbar Exercises abdominal bracing , back to wall yellow band horizontal shoulder abd 5 X                PT Education - 05/31/17 1547    Education provided Yes   Education Details TENS use/ care   Person(s) Educated Patient   Methods Explanation;Demonstration;Verbal cues   Comprehension Verbalized understanding;Returned demonstration          PT Short Term Goals - 12/15/16 0841      PT SHORT TERM GOAL #1   Title Patient will demsotrate fair abdominal contraction    Time 4   Period Weeks   Status New     PT SHORT TERM GOAL #2   Title Patient will be independent with initial HEP for right leg strengthening andcore strengthening    Time 4   Period Weeks   Status New     PT SHORT TERM GOAL #3   Title Patient will increase right gross lower extremity strength to 4/5    Time 4  Period Weeks   Status New           PT Long Term Goals - 05/02/17 1802      PT LONG TERM GOAL #1   Title Pt will be able to perform bed mobility without increase in LBP   Baseline significant pain at eval, began educating on log roll   Time 6   Period Weeks   Status New   Target Date 06/16/17     PT LONG TERM GOAL #2   Title Patient will stand for 30 minutes without increased pain in order to perfrom ADL's    Baseline 15 min at eval   Time 6   Period Weeks   Status New   Target Date 06/16/17     PT LONG TERM GOAL #3   Title Resolution of pain into lower extremities   Baseline pain in posterior right thigh at eval   Time 6   Period Weeks   Status New   Target Date 06/16/17     PT LONG TERM GOAL #4   Title Pt will verbalize at least 50% improvement in ability to complete self care and household chores, requiring fewer rest breaks due to pain   Baseline frequent breaks at eval   Time 6   Period Weeks   Status  New   Target Date 06/16/17               Plan - 05/31/17 1651    Clinical Impression Statement TENS issued with verbal order,  Per PT Selinda Eon.  Patient able to operate TENS correctly.     PT Treatment/Interventions ADLs/Self Care Home Management;Cryotherapy;Electrical Stimulation;Iontophoresis 4mg /ml Dexamethasone;Stair training;Gait training;Moist Heat;Ultrasound;Therapeutic activities;Therapeutic exercise;Neuromuscular re-education;Patient/family education;Passive range of motion;Manual techniques;Dry needling;Taping;Functional mobility training;Balance training   PT Next Visit Plan Answer any  home tens questions.  Follow up on written TENS order, standing abdominal work, flexion bias   PT Home Exercise Plan posterior pelvic tilt in supine & seated, log roll for bed mobility.  Abdominal isometrics/ obliques (no handout)   Consulted and Agree with Plan of Care Patient      Patient will benefit from skilled therapeutic intervention in order to improve the following deficits and impairments:     Visit Diagnosis: Chronic bilateral low back pain, with sciatica presence unspecified  Pain in right leg  Muscle weakness (generalized)  Difficulty in walking, not elsewhere classified  Pain in thoracic spine  Chronic bilateral low back pain without sciatica  Muscle spasm of back  Abnormal posture     Problem List Patient Active Problem List   Diagnosis Date Noted  . Sacroiliitis, not elsewhere classified ([place]) 04/24/2017  . Chronic right-sided low back pain with right-sided sciatica 01/12/2017  . Chronic bilateral thoracic back pain 01/12/2017  . Other spondylosis with radiculopathy, lumbar region 10/19/2016  . Thyroid disease   . Depression with anxiety 07/16/2016  . Chronic diastolic CHF (congestive heart failure) ([place]) 07/16/2016  . Aortic atherosclerosis ([place]) 11/24/2015  . Hypothyroidism (acquired)   . Unstable angina ([place]) 08/21/2015  . Chest pain   .  Coronary artery disease   . CAD (coronary artery disease), native coronary artery 01/14/2015  . Hypertension 01/14/2015  . Hypercholesterolemia 01/14/2015  . Tobacco abuse 01/14/2015    Kimberla Driskill  PTA 05/31/2017, 4:54 PM  [facility] [address] [place], [place], [address] Phone: [phone]   Fax:  [phone]  Name: Alec Mcphee MRN: [id number] Date of Birth: [birth year]

## 2017-05-31 NOTE — Telephone Encounter (Signed)
[facility] called and wanted verbal ok to send pt home with TENS unit.  I advised ok.  They will fax order for you to sign as well, FYI.

## 2017-06-07 DIAGNOSIS — E785 Hyperlipidemia, unspecified: Secondary | ICD-10-CM | POA: Diagnosis not present

## 2017-06-07 DIAGNOSIS — I2510 Atherosclerotic heart disease of native coronary artery without angina pectoris: Secondary | ICD-10-CM | POA: Diagnosis not present

## 2017-06-07 DIAGNOSIS — I10 Essential (primary) hypertension: Secondary | ICD-10-CM | POA: Diagnosis not present

## 2017-06-07 DIAGNOSIS — E039 Hypothyroidism, unspecified: Secondary | ICD-10-CM | POA: Diagnosis not present

## 2017-06-14 ENCOUNTER — Ambulatory Visit: Payer: No Typology Code available for payment source | Attending: Orthopedic Surgery | Admitting: Physical Therapy

## 2017-06-14 ENCOUNTER — Encounter: Payer: Self-pay | Admitting: Physical Therapy

## 2017-06-14 DIAGNOSIS — M6281 Muscle weakness (generalized): Secondary | ICD-10-CM | POA: Insufficient documentation

## 2017-06-14 DIAGNOSIS — M545 Low back pain: Secondary | ICD-10-CM | POA: Diagnosis not present

## 2017-06-14 DIAGNOSIS — R293 Abnormal posture: Secondary | ICD-10-CM

## 2017-06-14 DIAGNOSIS — M546 Pain in thoracic spine: Secondary | ICD-10-CM | POA: Insufficient documentation

## 2017-06-14 DIAGNOSIS — G8929 Other chronic pain: Secondary | ICD-10-CM | POA: Insufficient documentation

## 2017-06-14 DIAGNOSIS — R262 Difficulty in walking, not elsewhere classified: Secondary | ICD-10-CM | POA: Insufficient documentation

## 2017-06-14 DIAGNOSIS — M79604 Pain in right leg: Secondary | ICD-10-CM | POA: Diagnosis not present

## 2017-06-14 DIAGNOSIS — M62830 Muscle spasm of back: Secondary | ICD-10-CM | POA: Diagnosis not present

## 2017-06-14 NOTE — Therapy (Signed)
[place], [place], [address] Phone: [phone]   Fax:  [phone]  Physical Therapy Treatment/Medicaid   Patient Details  Name: Jocelyn [name] MRN: [id number] Date of Birth: Apr 23, 1961 Referring Provider: Newt Minion, MD   Encounter Date: 06/14/2017  PT End of Session - 06/14/17 3419    Visit Number  7    Number of Visits  15    Date for PT Re-Evaluation  07/14/17    PT Start Time  1350    PT Stop Time  1430    PT Time Calculation (min)  40 min    Activity Tolerance  Patient tolerated treatment well    Behavior During Therapy  [facility] for tasks assessed/performed       Past Medical History:  Diagnosis Date  . Arthritis   . Chronic diastolic CHF (congestive heart failure), NYHA class 2 ([place])   . HTN (hypertension)   . Hypercholesterolemia   . Hypothyroidism (acquired)   . Myocardial infarction ([place])   . Thyrotoxicosis   . TIA (transient ischemic attack)     Past Surgical History:  Procedure Laterality Date  . BREAST CYST EXCISION    . CORONARY STENT PLACEMENT  1999  . KNEE SURGERY    . partial hysterec    . THYROIDECTOMY      There were no vitals filed for this visit.  Subjective Assessment - 06/14/17 1401    Subjective  Pt was unsure why she was here today.  TENS unit is helping her.  Uses it and the heat  at home.  She thinks she may not need to come back.     Currently in Pain?  Yes    Pain Score  3     Pain Location  Back    Pain Orientation  Right;Lower    Pain Descriptors / Indicators  Aching;Tightness    Pain Type  Chronic pain    Pain Radiating Towards  to mid thigh     Pain Onset  More than a month ago    Aggravating Factors   standing    Pain Relieving Factors  heat, meds     Multiple Pain Sites  No         [facility] PT Assessment - 06/14/17 0001      AROM   Lumbar Flexion  50% or more     Lumbar Extension  75% or more     Lumbar - Right Side Bend  pain on R shaky  limited 50%    Lumbar - Left Side Bend  pain on L , limted 50%    Lumbar - Right Rotation  limited 25%     Lumbar - Left Rotation  limited 25%      PROM   Overall PROM Comments  Rt. hip flex 100 deg, Lt hip 120 deg      Strength   Right Hip Flexion  4-/5    Right Hip ABduction  3+/5    Left Hip Flexion  4/5    Left Hip ABduction  3+/5    Right Knee Flexion  4/5    Right Knee Extension  4/5    Left Knee Flexion  4/5    Left Knee Extension  4/5      Flexibility   Hamstrings  tight, 50-55 deg                   [facility] Adult PT Treatment/Exercise - 06/14/17 0001  Self-Care   Self-Care  Heat/Ice Application;Other Self-Care Comments    Other Self-Care Comments   HEP, progress, see self care , proper abdominal contraction (isometric)       Lumbar Exercises: Stretches   Active Hamstring Stretch  3 reps;30 seconds    Single Knee to Chest Stretch  3 reps;10 seconds    Lower Trunk Rotation  10 seconds    Lower Trunk Rotation Limitations  x 10     Pelvic Tilt  10 seconds             PT Education - 06/14/17 1435    Education provided  Yes    Education Details  continued PT, progress, trunk flexibility, HEP and correction    Person(s) Educated  Patient    Methods  Explanation;Handout    Comprehension  Verbalized understanding;Returned demonstration;Verbal cues required;Tactile cues required       PT Short Term Goals - 06/14/17 1357      PT SHORT TERM GOAL #1   Title  Patient will demsotrate fair abdominal contraction     Baseline  with cues     Status  On-going      PT SHORT TERM GOAL #2   Title  Patient will be independent with initial HEP for right leg strengthening and core strengthening       PT SHORT TERM GOAL #3   Title  Patient will increase right gross lower extremity strength to 4/5     Status  On-going        PT Long Term Goals - 06/14/17 1401      PT LONG TERM GOAL #1   Title  Pt will be able to perform bed mobility without increase in  LBP    Status  Achieved      PT LONG TERM GOAL #2   Title  Patient will stand for 30 minutes without increased pain in order to perfrom ADL's     Baseline  improving, knows to prop her leg     Status  On-going      PT LONG TERM GOAL #3   Title  Resolution of pain into lower extremities    Baseline  occasionally in Rt. thigh     Status  On-going      PT LONG TERM GOAL #4   Title  Pt will verbalize at least 50% improvement in ability to complete self care and household chores, requiring fewer rest breaks due to pain    Baseline  improving, still limited     Status  On-going            Plan - 06/14/17 1412    Clinical Impression Statement  Patient seen for renewal of POC today.  MD note indicated she could still benefit from PT, patient thought she completed her first month of visits and that was the end.  She contoinues to have moderate levels of pain, LE weakness and poor trunk mobility.  She moves with trepidation and fears increasing pain.  She has improved but still needs more PT to provide further education, exercise and restore functional mobility.      PT Frequency  2x / week    PT Duration  4 weeks    PT Treatment/Interventions  ADLs/Self Care Home Management;Cryotherapy;Electrical Stimulation;Iontophoresis 4mg /ml Dexamethasone;Stair training;Gait training;Moist Heat;Ultrasound;Therapeutic activities;Therapeutic exercise;Neuromuscular re-education;Patient/family education;Passive range of motion;Manual techniques;Dry needling;Taping;Functional mobility training;Balance training    PT Next Visit Plan  check trunnk flexibility HEP, progress .  Standing abs.  NuStep. manual  to Rt. SIJ, L spine     PT Home Exercise Plan  posterior pelvic tilt in supine & seated, log roll for bed mobility.  Abdominal isometrics/ obliques (no handout)    Consulted and Agree with Plan of Care  Patient     FOTO    Patient will benefit from skilled therapeutic intervention in order to improve the  following deficits and impairments:  Difficulty walking, Obesity, Increased muscle spasms, Decreased activity tolerance, Pain, Improper body mechanics, Impaired flexibility, Decreased strength, Postural dysfunction  Visit Diagnosis: Chronic bilateral low back pain, with sciatica presence unspecified  Pain in right leg  Muscle weakness (generalized)  Difficulty in walking, not elsewhere classified  Muscle spasm of back  Abnormal posture     Problem List Patient Active Problem List   Diagnosis Date Noted  . Sacroiliitis, not elsewhere classified ([place]) 04/24/2017  . Chronic right-sided low back pain with right-sided sciatica 01/12/2017  . Chronic bilateral thoracic back pain 01/12/2017  . Other spondylosis with radiculopathy, lumbar region 10/19/2016  . Thyroid disease   . Depression with anxiety 07/16/2016  . Chronic diastolic CHF (congestive heart failure) ([place]) 07/16/2016  . Aortic atherosclerosis ([place]) 11/24/2015  . Hypothyroidism (acquired)   . Unstable angina ([place]) 08/21/2015  . Chest pain   . Coronary artery disease   . CAD (coronary artery disease), native coronary artery 01/14/2015  . Hypertension 01/14/2015  . Hypercholesterolemia 01/14/2015  . Tobacco abuse 01/14/2015    PAA,JENNIFER 06/14/2017, 2:45 PM  [facility] [address] [place], [place], [address] Phone: [phone]   Fax:  [phone]  Name: Jocelyn [name] MRN: [id number] Date of Birth: 1961-01-27   Raeford Razor, PT 06/14/17 2:46 PM Phone: [phone] Fax: [phone]

## 2017-06-20 ENCOUNTER — Ambulatory Visit: Payer: No Typology Code available for payment source | Admitting: Physical Therapy

## 2017-06-20 DIAGNOSIS — M546 Pain in thoracic spine: Secondary | ICD-10-CM | POA: Diagnosis not present

## 2017-06-20 DIAGNOSIS — G8929 Other chronic pain: Secondary | ICD-10-CM | POA: Diagnosis not present

## 2017-06-20 DIAGNOSIS — M545 Low back pain: Principal | ICD-10-CM

## 2017-06-20 DIAGNOSIS — R262 Difficulty in walking, not elsewhere classified: Secondary | ICD-10-CM | POA: Diagnosis not present

## 2017-06-20 DIAGNOSIS — M6281 Muscle weakness (generalized): Secondary | ICD-10-CM

## 2017-06-20 DIAGNOSIS — M79604 Pain in right leg: Secondary | ICD-10-CM

## 2017-06-20 DIAGNOSIS — M62830 Muscle spasm of back: Secondary | ICD-10-CM | POA: Diagnosis not present

## 2017-06-20 DIAGNOSIS — R293 Abnormal posture: Secondary | ICD-10-CM | POA: Diagnosis not present

## 2017-06-20 NOTE — Patient Instructions (Addendum)
Leg Extension (Hamstring)    Sit toward front edge of chair, with leg out straight, heel on floor, toes pointing toward body. Keeping back straight, bend forward at hip, breathing out through pursed lips. Return, breathing in. Repeat _3__ times. Repeat with other leg. Do _1__ sessions per day.   Hold 30 seconds. Variation: Perform from standing position, with support.   Also lumbar stabilization 1 issued from ex drawer,  Tilt,  Tilt with clam 1-2 x a day 5-10 X each Hold 5 seconds - tilt   Copyright  VHI. All rights reserved.    LIFE LONG

## 2017-06-20 NOTE — Therapy (Signed)
[place], [place], [address] Phone: [phone]   Fax:  [phone]  Physical Therapy Treatment  Patient Details  Name: Jocelyn [name] MRN: [id number] Date of Birth: 1960-09-08 Referring Provider: Newt Minion, MD   Encounter Date: 06/20/2017  PT End of Session - 06/20/17 1515    Visit Number  8    Number of Visits  15    Date for PT Re-Evaluation  07/14/17    PT Start Time  2703 short session due to patient arriving late.     PT Stop Time  1456    PT Time Calculation (min)  27 min    Activity Tolerance  Patient tolerated treatment well    Behavior During Therapy  WFL for tasks assessed/performed       Past Medical History:  Diagnosis Date  . Arthritis   . Chronic diastolic CHF (congestive heart failure), NYHA class 2 ([place])   . HTN (hypertension)   . Hypercholesterolemia   . Hypothyroidism (acquired)   . Myocardial infarction ([place])   . Thyrotoxicosis   . TIA (transient ischemic attack)     Past Surgical History:  Procedure Laterality Date  . BREAST CYST EXCISION    . CARDIAC CATHETERIZATION N/A 08/24/2015   Procedure: Left Heart Cath and Coronary Angiography;  Surgeon: Jettie Booze, MD;  Location: [place] [facility];  Service: Cardiovascular;  Laterality: N/A;  . CORONARY STENT PLACEMENT  1999  . KNEE SURGERY    . partial hysterec    . THYROIDECTOMY      There were no vitals filed for this visit.                   [facility] Adult PT Treatment/Exercise - 06/20/17 0001      Self-Care   Self-Care  Posture    Posture  sitting.  neutral spine practice .  patent tends to arch into lumbar extension .  able to improve with cues ,  demo.       Lumbar Exercises: Stretches   Active Hamstring Stretch  3 reps;30 seconds    Active Hamstring Stretch Limitations  sitting , both,  HEP    Lower Trunk Rotation  10 seconds    Lower Trunk Rotation Limitations  5 X      Lumbar  Exercises: Seated   Other Seated Lumbar Exercises  on sit fit,  pelvic mobility,  marching  cued for neutral spine,  worked on neutral spine,        Lumbar Exercises: Supine   Clam  10 reps             PT Education - 06/20/17 1509    Education provided  Yes    Education Details  HEP    Person(s) Educated  Patient    Methods  Explanation;Demonstration;Verbal cues;Handout    Comprehension  Verbalized understanding;Returned demonstration       PT Short Term Goals - 06/20/17 1521      PT SHORT TERM GOAL #1   Title  Patient will demsotrate fair abdominal contraction     Baseline  with cues initially supine and sitting    Time  4    Period  Weeks    Status  On-going      PT SHORT TERM GOAL #2   Title  Patient will be independent with initial HEP for right leg strengthening and core strengthening     Baseline  cues needed  Time  4    Period  Weeks    Status  On-going      PT SHORT TERM GOAL #3   Title  Patient will increase right gross lower extremity strength to 4/5     Time  4    Period  Weeks    Status  Unable to assess        PT Long Term Goals - 06/14/17 1401      PT LONG TERM GOAL #1   Title  Pt will be able to perform bed mobility without increase in LBP    Status  Achieved      PT LONG TERM GOAL #2   Title  Patient will stand for 30 minutes without increased pain in order to perfrom ADL's     Baseline  improving, knows to prop her leg     Status  On-going      PT LONG TERM GOAL #3   Title  Resolution of pain into lower extremities    Baseline  occasionally in Rt. thigh     Status  On-going      PT LONG TERM GOAL #4   Title  Pt will verbalize at least 50% improvement in ability to complete self care and household chores, requiring fewer rest breaks due to pain    Baseline  improving, still limited     Status  On-going            Plan - 06/20/17 1518    Clinical Impression Statement  Able to progress HEP.  Patient able to demo neutral  sitting with cues.  She was able to decrease pain  with this.  Patient is still uncomfortable in the hooklying position. Pain at end of session mildly increased, however she declined the need for modalities.     PT Treatment/Interventions  ADLs/Self Care Home Management;Cryotherapy;Electrical Stimulation;Iontophoresis 4mg /ml Dexamethasone;Stair training;Gait training;Moist Heat;Ultrasound;Therapeutic activities;Therapeutic exercise;Neuromuscular re-education;Patient/family education;Passive range of motion;Manual techniques;Dry needling;Taping;Functional mobility training;Balance training    PT Next Visit Plan  check trunk flexibility HEP, progress .  Standing abs.  NuStep. manual to Rt. SIJ, L spine     PT Home Exercise Plan  posterior pelvic tilt in supine & seated, log roll for bed mobility.  Abdominal isometrics/ obliques (no handout),  hamstring stretch sitting,  clam  with tilt.     Consulted and Agree with Plan of Care  Patient       Patient will benefit from skilled therapeutic intervention in order to improve the following deficits and impairments:     Visit Diagnosis: Chronic bilateral low back pain, with sciatica presence unspecified  Pain in right leg  Muscle weakness (generalized)  Difficulty in walking, not elsewhere classified  Muscle spasm of back  Abnormal posture  Pain in thoracic spine     Problem List Patient Active Problem List   Diagnosis Date Noted  . Sacroiliitis, not elsewhere classified ([place]) 04/24/2017  . Chronic right-sided low back pain with right-sided sciatica 01/12/2017  . Chronic bilateral thoracic back pain 01/12/2017  . Other spondylosis with radiculopathy, lumbar region 10/19/2016  . Thyroid disease   . Depression with anxiety 07/16/2016  . Chronic diastolic CHF (congestive heart failure) ([place]) 07/16/2016  . Aortic atherosclerosis ([place]) 11/24/2015  . Hypothyroidism (acquired)   . Unstable angina ([place]) 08/21/2015  . Chest pain   . Coronary  artery disease   . CAD (coronary artery disease), native coronary artery 01/14/2015  . Hypertension 01/14/2015  . Hypercholesterolemia 01/14/2015  .  Tobacco abuse 01/14/2015    Jackye Dever PTA 06/20/2017, 3:23 PM  [facility] [address] [place], [place], [address] Phone: [phone]   Fax:  [phone]  Name: Jocelyn [name] MRN: [id number] Date of Birth: 16-Sep-1960

## 2017-06-26 ENCOUNTER — Other Ambulatory Visit (INDEPENDENT_AMBULATORY_CARE_PROVIDER_SITE_OTHER): Payer: Self-pay | Admitting: Orthopedic Surgery

## 2017-06-26 ENCOUNTER — Ambulatory Visit: Payer: No Typology Code available for payment source | Admitting: Physical Therapy

## 2017-06-27 NOTE — Telephone Encounter (Signed)
Please advise 

## 2017-06-27 NOTE — Telephone Encounter (Signed)
Ok refill? 

## 2017-06-29 ENCOUNTER — Encounter: Payer: Self-pay | Admitting: Physical Therapy

## 2017-06-29 ENCOUNTER — Ambulatory Visit: Payer: No Typology Code available for payment source | Admitting: Physical Therapy

## 2017-06-29 DIAGNOSIS — M6281 Muscle weakness (generalized): Secondary | ICD-10-CM | POA: Diagnosis not present

## 2017-06-29 DIAGNOSIS — M546 Pain in thoracic spine: Secondary | ICD-10-CM | POA: Diagnosis not present

## 2017-06-29 DIAGNOSIS — R262 Difficulty in walking, not elsewhere classified: Secondary | ICD-10-CM | POA: Diagnosis not present

## 2017-06-29 DIAGNOSIS — M545 Low back pain: Principal | ICD-10-CM

## 2017-06-29 DIAGNOSIS — M79604 Pain in right leg: Secondary | ICD-10-CM | POA: Diagnosis not present

## 2017-06-29 DIAGNOSIS — R293 Abnormal posture: Secondary | ICD-10-CM | POA: Diagnosis not present

## 2017-06-29 DIAGNOSIS — G8929 Other chronic pain: Secondary | ICD-10-CM | POA: Diagnosis not present

## 2017-06-29 DIAGNOSIS — M62830 Muscle spasm of back: Secondary | ICD-10-CM

## 2017-06-29 NOTE — Therapy (Signed)
[place], [place], [address] Phone: [phone]   Fax:  [phone]  Physical Therapy Treatment  Patient Details  Name: Jocelyn [name] MRN: [id number] Date of Birth: [birth year] Referring Provider: Newt Minion, MD   Encounter Date: 06/29/2017  PT End of Session - 06/29/17 1512    Visit Number  9    Number of Visits  15    Date for PT Re-Evaluation  07/14/17    PT Start Time  2992    PT Stop Time  1500    PT Time Calculation (min)  40 min    Activity Tolerance  Patient tolerated treatment well    Behavior During Therapy  [facility] for tasks assessed/performed       Past Medical History:  Diagnosis Date  . Arthritis   . Chronic diastolic CHF (congestive heart failure), NYHA class 2 ([place])   . HTN (hypertension)   . Hypercholesterolemia   . Hypothyroidism (acquired)   . Myocardial infarction ([place])   . Thyrotoxicosis   . TIA (transient ischemic attack)     Past Surgical History:  Procedure Laterality Date  . BREAST CYST EXCISION    . CARDIAC CATHETERIZATION N/A 08/24/2015   Procedure: Left Heart Cath and Coronary Angiography;  Surgeon: Jettie Booze, MD;  Location: [place] [facility];  Service: Cardiovascular;  Laterality: N/A;  . CORONARY STENT PLACEMENT  1999  . KNEE SURGERY    . partial hysterec    . THYROIDECTOMY      There were no vitals filed for this visit.  Subjective Assessment - 06/29/17 1425    Subjective  1/10 pain.  i took 2 flexoril 30 minutes ago.  I feel good.   Able to use the golfer's lift at home with less pain.    Currently in Pain?  Yes    Pain Score  1     Pain Location  Back    Pain Orientation  Right;Lower    Pain Radiating Towards  to mid thigh  not today    Pain Frequency  Constant    Aggravating Factors   standing    Pain Relieving Factors  heat meds,  exercises    Effect of Pain on Daily Activities  ADL difficult.         [facility] PT Assessment - 06/29/17  0001      Flexibility   Soft Tissue Assessment /Muscle Length  -- able to reach 10.5 inches from the floor.                  [facility] Adult PT Treatment/Exercise - 06/29/17 0001      Lumbar Exercises: Stretches   Single Knee to Chest Stretch  2 reps;30 seconds AA    Lower Trunk Rotation  5 reps    Lower Trunk Rotation Limitations  10 seconds      Lumbar Exercises: Aerobic   Stationary Bike  6.6 minutes,  484 steps,  L4      Lumbar Exercises: Seated   Other Seated Lumbar Exercises  on green ball: practiced sitting posture,  pr=elvic mobility,  sit and reach,  bouncing iand bounce and reach.   Close to CGA for safety,  Patient fearful initially.    Other Seated Lumbar Exercises  transverse abdominus in sitting.      Lumbar Exercises: Supine   Ab Set  5 reps 2 sets    Clam  10 reps red band    Bent  Knee Raise  10 reps    Bridge  10 reps cued,  small lifts      Lumbar Exercises: Sidelying   Clam  10 reps each               PT Short Term Goals - 06/29/17 1515      PT SHORT TERM GOAL #1   Title  Patient will demsotrate fair abdominal contraction     Baseline  able  to in both supine anf sitting.      Time  4    Period  Weeks    Status  Achieved      PT SHORT TERM GOAL #2   Title  Patient will be independent with initial HEP for right leg strengthening and core strengthening     Baseline  cues needed    Time  4    Period  Weeks    Status  On-going      PT SHORT TERM GOAL #3   Title  Patient will increase right gross lower extremity strength to 4/5     Time  4    Period  Weeks    Status  Unable to assess        PT Long Term Goals - 06/14/17 1401      PT LONG TERM GOAL #1   Title  Pt will be able to perform bed mobility without increase in LBP    Status  Achieved      PT LONG TERM GOAL #2   Title  Patient will stand for 30 minutes without increased pain in order to perfrom ADL's     Baseline  improving, knows to prop her leg     Status  On-going       PT LONG TERM GOAL #3   Title  Resolution of pain into lower extremities    Baseline  occasionally in Rt. thigh     Status  On-going      PT LONG TERM GOAL #4   Title  Pt will verbalize at least 50% improvement in ability to complete self care and household chores, requiring fewer rest breaks due to pain    Baseline  improving, still limited     Status  On-going            Plan - 06/29/17 1512    Clinical Impression Statement  Patient was able to exercise without increasing her 1/10 pain.   She was challanged with the sitting ball exercises.  She was able to have good core activation in sitting after instruction.    PT Next Visit Plan  HEP, progress .  Standing abs.  NuStep. manual to Rt. SIJ, L spine ,  Consider hip flexor and quad stretch.     PT Home Exercise Plan  posterior pelvic tilt in supine & seated, log roll for bed mobility.  Abdominal isometrics/ obliques (no handout),  hamstring stretch sitting,  clam  with tilt.     Consulted and Agree with Plan of Care  Patient       Patient will benefit from skilled therapeutic intervention in order to improve the following deficits and impairments:     Visit Diagnosis: Chronic bilateral low back pain, with sciatica presence unspecified  Pain in right leg  Muscle weakness (generalized)  Difficulty in walking, not elsewhere classified  Muscle spasm of back  Abnormal posture  Pain in thoracic spine     Problem List Patient Active Problem List   Diagnosis  Date Noted  . Sacroiliitis, not elsewhere classified ([place]) 04/24/2017  . Chronic right-sided low back pain with right-sided sciatica 01/12/2017  . Chronic bilateral thoracic back pain 01/12/2017  . Other spondylosis with radiculopathy, lumbar region 10/19/2016  . Thyroid disease   . Depression with anxiety 07/16/2016  . Chronic diastolic CHF (congestive heart failure) ([place]) 07/16/2016  . Aortic atherosclerosis ([place]) 11/24/2015  . Hypothyroidism (acquired)    . Unstable angina ([place]) 08/21/2015  . Chest pain   . Coronary artery disease   . CAD (coronary artery disease), native coronary artery 01/14/2015  . Hypertension 01/14/2015  . Hypercholesterolemia 01/14/2015  . Tobacco abuse 01/14/2015    Sianne Tejada PTA 06/29/2017, 3:17 PM  [facility] [address] [place], [place], [address] Phone: [phone]   Fax:  [phone]  Name: Jocelyn [name] MRN: [id number] Date of Birth: [birth year]

## 2017-07-03 ENCOUNTER — Ambulatory Visit: Payer: No Typology Code available for payment source | Attending: Orthopedic Surgery | Admitting: Physical Therapy

## 2017-07-03 DIAGNOSIS — M545 Low back pain: Secondary | ICD-10-CM | POA: Diagnosis not present

## 2017-07-03 DIAGNOSIS — G8929 Other chronic pain: Secondary | ICD-10-CM | POA: Diagnosis not present

## 2017-07-03 DIAGNOSIS — M62830 Muscle spasm of back: Secondary | ICD-10-CM | POA: Insufficient documentation

## 2017-07-03 DIAGNOSIS — M79604 Pain in right leg: Secondary | ICD-10-CM | POA: Diagnosis not present

## 2017-07-03 DIAGNOSIS — M6281 Muscle weakness (generalized): Secondary | ICD-10-CM | POA: Diagnosis not present

## 2017-07-03 DIAGNOSIS — R293 Abnormal posture: Secondary | ICD-10-CM | POA: Diagnosis not present

## 2017-07-03 DIAGNOSIS — R262 Difficulty in walking, not elsewhere classified: Secondary | ICD-10-CM | POA: Insufficient documentation

## 2017-07-03 DIAGNOSIS — M546 Pain in thoracic spine: Secondary | ICD-10-CM | POA: Diagnosis not present
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
## 2017-07-03 NOTE — Patient Instructions (Signed)
Abduction: Side Leg Lift (Eccentric) - Side-Lying    Lie on side. Lift top leg slightly higher than shoulder level. Keep top leg straight with body, toes pointing forward. Slowly lower for 3-5 seconds. __10_ reps per set, _1-2__ sets per day, ___ days per week.   http://ecce.exer.us/63   Copyright  VHI. All rights reserved.    Straight Leg Raise    Tighten stomach and slowly raise locked right leg __8-10__ inches from floor. Repeat _10___ times per set. Do __1-2__ sets per session. Do ___2_ sessions per day.  http://orth.exer.us/1103   Copyright  VHI. All rights reserved.

## 2017-07-05 ENCOUNTER — Encounter: Payer: Self-pay | Admitting: Physical Therapy

## 2017-07-05 ENCOUNTER — Ambulatory Visit: Payer: No Typology Code available for payment source | Admitting: Physical Therapy

## 2017-07-05 DIAGNOSIS — M546 Pain in thoracic spine: Secondary | ICD-10-CM

## 2017-07-05 DIAGNOSIS — M6281 Muscle weakness (generalized): Secondary | ICD-10-CM

## 2017-07-05 DIAGNOSIS — M79604 Pain in right leg: Secondary | ICD-10-CM | POA: Diagnosis not present

## 2017-07-05 DIAGNOSIS — R262 Difficulty in walking, not elsewhere classified: Secondary | ICD-10-CM | POA: Diagnosis not present

## 2017-07-05 DIAGNOSIS — M62830 Muscle spasm of back: Secondary | ICD-10-CM | POA: Diagnosis not present

## 2017-07-05 DIAGNOSIS — R293 Abnormal posture: Secondary | ICD-10-CM | POA: Diagnosis not present

## 2017-07-05 DIAGNOSIS — M545 Low back pain: Principal | ICD-10-CM

## 2017-07-05 DIAGNOSIS — G8929 Other chronic pain: Secondary | ICD-10-CM | POA: Diagnosis not present

## 2017-07-05 NOTE — Patient Instructions (Signed)
Issued hip flexor stretch from ex drawer Daily 3 X 30 seconds

## 2017-07-05 NOTE — Therapy (Addendum)
[place], [place], [address] Phone: [phone]   Fax:  [phone]  Physical Therapy Treatment and Discharge   Patient Details  Name: Jocelyn [name] MRN: [id number] Date of Birth: [birth year] Referring Provider: Newt Minion, MD   Encounter Date: 07/05/2017  PT End of Session - 07/05/17 1759    Visit Number  11    Number of Visits  15    Date for PT Re-Evaluation  07/14/17    PT Start Time  1419    PT Stop Time  1500    PT Time Calculation (min)  41 min    Activity Tolerance  Patient tolerated treatment well    Behavior During Therapy  [facility] for tasks assessed/performed       Past Medical History:  Diagnosis Date  . Arthritis   . Chronic diastolic CHF (congestive heart failure), NYHA class 2 ([place])   . HTN (hypertension)   . Hypercholesterolemia   . Hypothyroidism (acquired)   . Myocardial infarction ([place])   . Thyrotoxicosis   . TIA (transient ischemic attack)     Past Surgical History:  Procedure Laterality Date  . BREAST CYST EXCISION    . CARDIAC CATHETERIZATION N/A 08/24/2015   Procedure: Left Heart Cath and Coronary Angiography;  Surgeon: Jettie Booze, MD;  Location: [facility];  Service: Cardiovascular;  Laterality: N/A;  . CORONARY STENT PLACEMENT  1999  . KNEE SURGERY    . partial hysterec    . THYROIDECTOMY      There were no vitals filed for this visit.  Subjective Assessment - 07/05/17 1623    Subjective  I think this is my last day.  It will take some time for me to build strength in the leg.  Pain 1/10.    Currently in Pain?  Yes    Pain Score  1     Pain Location  Back    Pain Orientation  Right;Lower    Pain Descriptors / Indicators  Aching;Tightness    Pain Frequency  Constant    Aggravating Factors   standing getting up from squatting    Pain Relieving Factors  heat meds exercise    Effect of Pain on Daily Activities  ADL difficult                       [facility] Adult PT Treatment/Exercise - 07/05/17 0001      Self-Care   Self-Care  Lifting;Posture;Other Self-Care Comments    Lifting  simulated correct technique,  moderate cues,  paint was able to feel the work in legs and not in the back after practice.    Posture  sitting,  standing practiced,  She tends to Sit good but in reality she was hyper extending lumbar spine.    Other Self-Care Comments   anatomy,  what hip flexor  does to low back if tight.      Lumbar Exercises: Stretches   Hip Flexor Stretch  3 reps;30 seconds concurrent with quad stretch,  heat used.  Tight             PT Education - 07/05/17 1759    Education provided  Yes    Education Details  HEP,  SELF care    Person(s) Educated  Patient    Methods  Demonstration;Handout    Comprehension  Verbalized understanding;Returned demonstration       PT Short Term Goals - 07/05/17 1803  PT SHORT TERM GOAL #1   Title  Patient will demsotrate fair abdominal contraction     Baseline  able  to in both supine anf sitting.      Time  4    Period  Weeks    Status  Achieved      PT SHORT TERM GOAL #2   Title  Patient will be independent with initial HEP for right leg strengthening and core strengthening     Baseline  independent    Time  4    Period  Weeks    Status  Achieved      PT SHORT TERM GOAL #3   Title  Patient will increase right gross lower extremity strength to 4/5     Baseline  not formally tested , but demo with ex in clinic    Time  4    Period  Weeks    Status  Achieved        PT Long Term Goals - 07/05/17 1804      PT LONG TERM GOAL #1   Title  Pt will be able to perform bed mobility without increase in LBP    Time  6    Period  Weeks    Status  Achieved      PT LONG TERM GOAL #2   Title  Patient will stand for 30 minutes without increased pain in order to perfrom ADL's     Baseline  some level of pain increases with this    Time  6    Period   Weeks    Status  Partially Met      PT LONG TERM GOAL #3   Title  Resolution of pain into lower extremities    Baseline  occasionally in Rt. thigh     Time  6    Period  Weeks    Status  Partially Met      PT LONG TERM GOAL #4   Title  Pt will verbalize at least 50% improvement in ability to complete self care and household chores, requiring fewer rest breaks due to pain    Baseline  pt report >50%     Time  6    Period  Weeks    Status  Achieved          FOTO 63%, 7% improved    Plan - 07/05/17 1800    Clinical Impression Statement  Pain 1/10 today.  Patient feels she is ready for discharge to home exercise and the gym.  Her hip flex right was tight with Marcello Moores test,  thigh at least 4 inches off table.  She needed education about lifting techniques and posture.  She was able to demo correct technique post session.  All goals met or partially met.    PT Next Visit Plan  D/C    PT Home Exercise Plan  posterior pelvic tilt in supine & seated, log roll for bed mobility.  Abdominal isometrics/ obliques (no handout),  hamstring stretch sitting,  clam  with tilt. Hip flex stretch    Consulted and Agree with Plan of Care  Patient       Patient will benefit from skilled therapeutic intervention in order to improve the following deficits and impairments:     Visit Diagnosis: Chronic bilateral low back pain, with sciatica presence unspecified  Pain in right leg  Muscle weakness (generalized)  Difficulty in walking, not elsewhere classified  Muscle spasm of back  Abnormal posture  Pain in thoracic spine  Chronic bilateral low back pain without sciatica     Problem List Patient Active Problem List   Diagnosis Date Noted  . Sacroiliitis, not elsewhere classified ([place]) 04/24/2017  . Chronic right-sided low back pain with right-sided sciatica 01/12/2017  . Chronic bilateral thoracic back pain 01/12/2017  . Other spondylosis with radiculopathy, lumbar region 10/19/2016  .  Thyroid disease   . Depression with anxiety 07/16/2016  . Chronic diastolic CHF (congestive heart failure) ([place]) 07/16/2016  . Aortic atherosclerosis ([place]) 11/24/2015  . Hypothyroidism (acquired)   . Unstable angina ([place]) 08/21/2015  . Chest pain   . Coronary artery disease   . CAD (coronary artery disease), native coronary artery 01/14/2015  . Hypertension 01/14/2015  . Hypercholesterolemia 01/14/2015  . Tobacco abuse 01/14/2015    Addilyne Backs  PTA 07/05/2017, 6:06 PM  [facility] [address] [place], [place], [address] Phone: [phone]   Fax:  [phone]  Name: Jocelyn [name] MRN: [id number] Date of Birth: [birth year]  PHYSICAL THERAPY DISCHARGE SUMMARY  Visits from Start of Care: 11  Current functional level related to goals / functional outcomes: See above   Remaining deficits: Pain is minimal, hip tightness, ROM in spine, strength  Education / Equipment: HEP, posture, MHP  Plan: Patient agrees to discharge.  Patient goals were partially met. Patient is being discharged due to being pleased with the current functional level.  ?????     Raeford Razor, PT 07/06/17 10:13 AM Phone: [phone] Fax: [phone]

## 2017-07-10 ENCOUNTER — Encounter: Payer: Medicaid Other | Admitting: Physical Therapy

## 2017-07-12 ENCOUNTER — Encounter: Payer: Medicaid Other | Admitting: Physical Therapy

## 2017-07-17 ENCOUNTER — Encounter: Payer: Medicaid Other | Admitting: Physical Therapy

## 2017-07-18 ENCOUNTER — Telehealth: Payer: Self-pay | Admitting: Physical Therapy

## 2017-07-18 NOTE — Telephone Encounter (Signed)
Returned patient's call about her request to get the bill for the TENS unit. Instructed her to call the number on the paperwork with the TENS from [employer] and to call us back if unable to get the info she needs.

## 2017-08-04 DIAGNOSIS — I2510 Atherosclerotic heart disease of native coronary artery without angina pectoris: Secondary | ICD-10-CM | POA: Diagnosis not present

## 2017-08-04 DIAGNOSIS — I10 Essential (primary) hypertension: Secondary | ICD-10-CM | POA: Diagnosis not present

## 2017-08-04 DIAGNOSIS — Z131 Encounter for screening for diabetes mellitus: Secondary | ICD-10-CM | POA: Diagnosis not present

## 2017-08-04 DIAGNOSIS — E039 Hypothyroidism, unspecified: Secondary | ICD-10-CM | POA: Diagnosis not present

## 2017-08-04 DIAGNOSIS — Z0110 Encounter for examination of ears and hearing without abnormal findings: Secondary | ICD-10-CM | POA: Diagnosis not present

## 2017-08-04 DIAGNOSIS — E785 Hyperlipidemia, unspecified: Secondary | ICD-10-CM | POA: Diagnosis not present

## 2017-08-04 DIAGNOSIS — H539 Unspecified visual disturbance: Secondary | ICD-10-CM | POA: Diagnosis not present

## 2017-09-12 DIAGNOSIS — E785 Hyperlipidemia, unspecified: Secondary | ICD-10-CM | POA: Diagnosis not present

## 2017-09-12 DIAGNOSIS — R7989 Other specified abnormal findings of blood chemistry: Secondary | ICD-10-CM | POA: Diagnosis not present

## 2017-09-12 DIAGNOSIS — I10 Essential (primary) hypertension: Secondary | ICD-10-CM | POA: Diagnosis not present

## 2017-09-12 DIAGNOSIS — Z720 Tobacco use: Secondary | ICD-10-CM | POA: Diagnosis not present

## 2017-09-12 DIAGNOSIS — I2510 Atherosclerotic heart disease of native coronary artery without angina pectoris: Secondary | ICD-10-CM | POA: Diagnosis not present

## 2017-09-14 DIAGNOSIS — R7989 Other specified abnormal findings of blood chemistry: Secondary | ICD-10-CM | POA: Diagnosis not present

## 2017-09-14 DIAGNOSIS — Z0000 Encounter for general adult medical examination without abnormal findings: Secondary | ICD-10-CM | POA: Diagnosis not present

## 2017-09-29 DIAGNOSIS — E785 Hyperlipidemia, unspecified: Secondary | ICD-10-CM | POA: Diagnosis not present

## 2017-09-29 DIAGNOSIS — I2510 Atherosclerotic heart disease of native coronary artery without angina pectoris: Secondary | ICD-10-CM | POA: Diagnosis not present

## 2017-09-29 DIAGNOSIS — R7989 Other specified abnormal findings of blood chemistry: Secondary | ICD-10-CM | POA: Diagnosis not present

## 2017-09-29 DIAGNOSIS — I10 Essential (primary) hypertension: Secondary | ICD-10-CM | POA: Diagnosis not present

## 2017-10-02 ENCOUNTER — Other Ambulatory Visit: Payer: Self-pay | Admitting: Interventional Cardiology

## 2017-10-16 ENCOUNTER — Other Ambulatory Visit: Payer: Self-pay | Admitting: Internal Medicine

## 2017-10-16 ENCOUNTER — Other Ambulatory Visit (HOSPITAL_BASED_OUTPATIENT_CLINIC_OR_DEPARTMENT_OTHER): Payer: Self-pay | Admitting: Internal Medicine

## 2017-10-16 DIAGNOSIS — Z1231 Encounter for screening mammogram for malignant neoplasm of breast: Secondary | ICD-10-CM

## 2017-11-01 DIAGNOSIS — I10 Essential (primary) hypertension: Secondary | ICD-10-CM | POA: Diagnosis not present

## 2017-11-01 DIAGNOSIS — E039 Hypothyroidism, unspecified: Secondary | ICD-10-CM | POA: Diagnosis not present

## 2017-11-02 ENCOUNTER — Ambulatory Visit
Admission: RE | Admit: 2017-11-02 | Discharge: 2017-11-02 | Disposition: A | Discharge status: Home / Self Care | Payer: Medicare Other | Source: Ambulatory Visit | Attending: Internal Medicine | Admitting: Internal Medicine

## 2017-11-02 DIAGNOSIS — Z1231 Encounter for screening mammogram for malignant neoplasm of breast: Secondary | ICD-10-CM

## 2017-11-03 ENCOUNTER — Ambulatory Visit (INDEPENDENT_AMBULATORY_CARE_PROVIDER_SITE_OTHER): Payer: Medicare Other | Admitting: Endocrinology

## 2017-11-03 ENCOUNTER — Encounter: Payer: Self-pay | Admitting: Endocrinology

## 2017-11-03 VITALS — BP 110/72 | HR 75 | Wt 216.4 lb

## 2017-11-03 DIAGNOSIS — E039 Hypothyroidism, unspecified: Secondary | ICD-10-CM | POA: Diagnosis not present

## 2017-11-03 LAB — T4, FREE: FREE T4: 1.02 ng/dL (ref 0.60–1.60)

## 2017-11-03 LAB — TSH

## 2017-11-03 NOTE — Patient Instructions (Signed)
blood tests are requested for you today.  We'll let you know about the results.  If you need to resume medication, we'll go with just 25 mcg/day.  Please come back for a follow-up appointment in 6 months.

## 2017-11-03 NOTE — Progress Notes (Signed)
Subjective:    Patient ID: Jocelyn [name], female    DOB: 1961-05-27, 57 y.o.   MRN: [id number]  HPI Pt is referred by for hyperthyroidism. She had partial thyroidectomy in 1989.  She has been on synthroid intermittently since then.  Most recently, she has been off x 1 month. she has never had XRT to the anterior neck.  she has not recently had had thyroid imaging.  she does not consume kelp or any other non-prescribed thyroid medication.  she has never been on amiodarone.    She has slight tremor of the hands, and assoc anxiety.   Past Medical History:  Diagnosis Date  . Arthritis   . Chronic diastolic CHF (congestive heart failure), NYHA class 2 ([place])   . HTN (hypertension)   . Hypercholesterolemia   . Hypothyroidism (acquired)   . Myocardial infarction ([place])   . Thyrotoxicosis   . TIA (transient ischemic attack)     Past Surgical History:  Procedure Laterality Date  . BREAST CYST EXCISION    . CARDIAC CATHETERIZATION N/A 08/24/2015   Procedure: Left Heart Cath and Coronary Angiography;  Surgeon: Jettie Booze, MD;  Location: [facility];  Service: Cardiovascular;  Laterality: N/A;  . CORONARY STENT PLACEMENT  1999  . KNEE SURGERY    . partial hysterec    . THYROIDECTOMY      Social History   Socioeconomic History  . Marital status: Legally Separated    Spouse name: Not on file  . Number of children: 2  . Years of education: Not on file  . Highest education level: Not on file  Occupational History  . Occupation: [employer]  Social Needs  . Financial resource strain: Not on file  . Food insecurity:    Worry: Not on file    Inability: Not on file  . Transportation needs:    Medical: Not on file    Non-medical: Not on file  Tobacco Use  . Smoking status: Current Every Day Smoker    Packs/day: 0.50    Types: Cigarettes  . Smokeless tobacco: Never Used  Substance and Sexual Activity  . Alcohol use: No    Alcohol/week: 0.0 oz  . Drug use: No  . Sexual  activity: Not on file  Lifestyle  . Physical activity:    Days per week: Not on file    Minutes per session: Not on file  . Stress: Not on file  Relationships  . Social connections:    Talks on phone: Not on file    Gets together: Not on file    Attends religious service: Not on file    Active member of club or organization: Not on file    Attends meetings of clubs or organizations: Not on file    Relationship status: Not on file  . Intimate partner violence:    Fear of current or ex partner: Not on file    Emotionally abused: Not on file    Physically abused: Not on file    Forced sexual activity: Not on file  Other Topics Concern  . Not on file  Social History Narrative  . Not on file    Current Outpatient Medications on File Prior to Visit  Medication Sig Dispense Refill  . ALPRAZolam (XANAX) 1 MG tablet TAKE 1 TABLET BY MOUTH BID  1  . [facility] ASPIRIN LOW DOSE 81 MG EC tablet TAKE 1 TABLET BY MOUTH EVERY DAY 90 tablet 0  . cyclobenzaprine (FLEXERIL) 10  MG tablet Take 1 tablet (10 mg total) by mouth 3 (three) times daily as needed for muscle spasms. 21 tablet 0  . cyclobenzaprine (FLEXERIL) 10 MG tablet Take 1 tablet (10 mg total) by mouth 3 (three) times daily as needed for muscle spasms. (Patient not taking: Reported on 11/04/2017) 30 tablet 0  . furosemide (LASIX) 40 MG tablet Take 40 mg by mouth daily.     . isosorbide mononitrate (IMDUR) 30 MG 24 hr tablet Take 2 tablets (60 mg total) by mouth daily. 30 tablet 5  . metoprolol tartrate (LOPRESSOR) 25 MG tablet Take 1 tablet (25 mg total) by mouth 2 (two) times daily. 30 tablet 2  . nitroGLYCERIN (NITROSTAT) 0.3 MG SL tablet TAKE 1 TABLET UNDER THE TONGUE EVERY 5 MINUTES AS NEEDED FOR CHEST PAIN  0  . simvastatin (ZOCOR) 20 MG tablet Take 20 mg by mouth at bedtime.  1  . spironolactone (ALDACTONE) 25 MG tablet Take 25 mg by mouth 2 (two) times daily.  1  . traMADol (ULTRAM) 50 MG tablet Take 1 tablet (50 mg total) by mouth 2  (two) times daily. (Patient not taking: Reported on 11/04/2017) 28 tablet 0  . traMADol (ULTRAM) 50 MG tablet Take 1 tablet (50 mg total) by mouth 2 (two) times daily. (Patient not taking: Reported on 11/04/2017) 40 tablet 0  . traMADol (ULTRAM) 50 MG tablet TAKE 1 TABLET BY MOUTH TWICE A DAY AS NEEDED (Patient not taking: Reported on 11/04/2017) 40 tablet 0  . triamcinolone cream (KENALOG) 0.5 % Apply 1 application topically daily as needed (for sun exposure).    Marland Kitchen albuterol (PROVENTIL HFA;VENTOLIN HFA) 108 (90 Base) MCG/ACT inhaler Inhale 2 puffs into the lungs every 4 (four) hours as needed for shortness of breath.     Marland Kitchen ibuprofen (ADVIL,MOTRIN) 800 MG tablet Take 1 tablet (800 mg total) by mouth 3 (three) times daily. (Patient not taking: Reported on 11/03/2017) 21 tablet 0   No current facility-administered medications on file prior to visit.     Allergies  Allergen Reactions  . Hydrocodone Nausea Only    Family History  Problem Relation Age of Onset  . Heart disease Mother   . Stroke Mother   . Hypertension Mother   . Heart disease Father   . Cancer Father   . Graves' disease Father   . Stroke Brother   . Hypertension Maternal Grandmother   . Heart attack Neg Hx     BP 110/72 (BP Location: Left Arm, Patient Position: Sitting, Cuff Size: Normal)   Pulse 75   Wt 216 lb 6.4 oz (98.2 kg)   SpO2 96%   BMI 37.14 kg/m    Review of Systems denies weight loss, headache, hoarseness, visual loss, palpitations, diarrhea, polyuria, muscle weakness, edema, excessive diaphoresis, heat intolerance, easy bruising, and rhinorrhea. She has mild doe.      Objective:   Physical Exam VS: see vs page GEN: no distress HEAD: head: no deformity eyes: no periorbital swelling; slight bilat proptosis.   external nose and ears are normal mouth: no lesion seen NECK: a healed scar is present.  I do not appreciate a nodule in the thyroid or elsewhere in the neck CHEST WALL: no deformity LUNGS: clear  to auscultation CV: reg rate and rhythm, no murmur ABD: abdomen is soft, nontender.  no hepatosplenomegaly.  not distended.  no hernia MUSCULOSKELETAL: muscle bulk and strength are grossly normal.  no obvious joint swelling.  gait is normal and steady EXTEMITIES: no  deformity.  no edema PULSES: no carotid bruit NEURO:  cn 2-12 grossly intact.   readily moves all 4's.  sensation is intact to touch on all 4's.  No tremor SKIN:  Normal texture and temperature.  No rash or suspicious lesion is visible.  Not diaphoretic NODES:  None palpable at the neck PSYCH: alert, well-oriented.  Does not appear anxious nor depressed.  I have reviewed outside records, and summarized: Pt was noted to have suppressed TSH.  synthroid 50/d was d/c'ed, and pt was referred here.  outside test results are reviewed: TSH is undetectable    Assessment & Plan:  Hyperthyroidism, new to me, recurrent after subtotal thyroidectomy.  TFT have apparently been variable, so we'll minimize med adjustments.  Patient Instructions  blood tests are requested for you today.  We'll let you know about the results.  If you need to resume medication, we'll go with just 25 mcg/day.  Please come back for a follow-up appointment in 6 months.

## 2017-11-04 ENCOUNTER — Emergency Department (HOSPITAL_COMMUNITY): Payer: Medicare Other

## 2017-11-04 ENCOUNTER — Other Ambulatory Visit: Payer: Self-pay

## 2017-11-04 ENCOUNTER — Ambulatory Visit (HOSPITAL_COMMUNITY)
Admission: EM | Admit: 2017-11-04 | Discharge: 2017-11-04 | Disposition: A | Discharge status: Home / Self Care | Payer: Medicare Other | Source: Home / Self Care

## 2017-11-04 ENCOUNTER — Encounter (HOSPITAL_COMMUNITY): Payer: Self-pay | Admitting: *Deleted

## 2017-11-04 ENCOUNTER — Emergency Department (HOSPITAL_COMMUNITY)
Admission: EM | Admit: 2017-11-04 | Discharge: 2017-11-04 | Disposition: A | Discharge status: Home / Self Care | Payer: Medicare Other | Attending: Emergency Medicine | Admitting: Emergency Medicine

## 2017-11-04 DIAGNOSIS — I110 Hypertensive heart disease with heart failure: Secondary | ICD-10-CM | POA: Diagnosis not present

## 2017-11-04 DIAGNOSIS — Z79899 Other long term (current) drug therapy: Secondary | ICD-10-CM | POA: Diagnosis not present

## 2017-11-04 DIAGNOSIS — I5032 Chronic diastolic (congestive) heart failure: Secondary | ICD-10-CM | POA: Insufficient documentation

## 2017-11-04 DIAGNOSIS — I2510 Atherosclerotic heart disease of native coronary artery without angina pectoris: Secondary | ICD-10-CM | POA: Diagnosis not present

## 2017-11-04 DIAGNOSIS — F17210 Nicotine dependence, cigarettes, uncomplicated: Secondary | ICD-10-CM | POA: Diagnosis not present

## 2017-11-04 DIAGNOSIS — E039 Hypothyroidism, unspecified: Secondary | ICD-10-CM | POA: Insufficient documentation

## 2017-11-04 DIAGNOSIS — Z7982 Long term (current) use of aspirin: Secondary | ICD-10-CM | POA: Insufficient documentation

## 2017-11-04 DIAGNOSIS — R079 Chest pain, unspecified: Secondary | ICD-10-CM | POA: Diagnosis not present

## 2017-11-04 LAB — CBC
HEMATOCRIT: 38.9 % (ref 36.0–46.0)
HEMOGLOBIN: 13 g/dL (ref 12.0–15.0)
MCH: 29.1 pg (ref 26.0–34.0)
MCHC: 33.4 g/dL (ref 30.0–36.0)
MCV: 87.2 fL (ref 78.0–100.0)
Platelets: 252 10*3/uL (ref 150–400)
RBC: 4.46 MIL/uL (ref 3.87–5.11)
RDW: 13.4 % (ref 11.5–15.5)
WBC: 6.8 10*3/uL (ref 4.0–10.5)

## 2017-11-04 LAB — BASIC METABOLIC PANEL
ANION GAP: 12 (ref 5–15)
BUN: 11 mg/dL (ref 6–20)
CO2: 25 mmol/L (ref 22–32)
Calcium: 9.2 mg/dL (ref 8.9–10.3)
Chloride: 103 mmol/L (ref 101–111)
Creatinine, Ser: 1.02 mg/dL — ABNORMAL HIGH (ref 0.44–1.00)
Glucose, Bld: 94 mg/dL (ref 65–99)
POTASSIUM: 3.8 mmol/L (ref 3.5–5.1)
Sodium: 140 mmol/L (ref 135–145)

## 2017-11-04 LAB — I-STAT TROPONIN, ED: TROPONIN I, POC: 0.01 ng/mL (ref 0.00–0.08)

## 2017-11-04 LAB — TROPONIN I: Troponin I: <0.03 ng/mL (ref <0.03)

## 2017-11-04 NOTE — Discharge Instructions (Addendum)
Return if any problems.  Call your Cardiologist on Monday to be seen for evaluation

## 2017-11-04 NOTE — ED Triage Notes (Signed)
The pt is c/o lt upper chest pain for the past two hours with sob and she  Has this all the time  No pain at this time

## 2017-11-04 NOTE — ED Provider Notes (Signed)
[facility] EMERGENCY DEPARTMENT Provider Note   CSN: [id number] Arrival date & time: 11/04/17  1544     History   Chief Complaint Chief Complaint  Patient presents with  . Chest Pain    HPI Jocelyn [name] is a 57 y.o. female.  The history is provided by the patient. No language interpreter was used.  Chest Pain   This is a new problem. The current episode started 1 to 2 hours ago. The problem occurs constantly. The problem has been resolved. The pain is associated with exertion. The pain is present in the lateral region. The pain is moderate. The quality of the pain is described as exertional. The pain radiates to the left shoulder.  Pt reports she was lifting heavy boxes.  Pt thinks she was over doing it.  Pt reports pain lasted for 15 seconds.  Pt is pain free.  Pt reports she has nitroglycernin but she did not have time to take.   Past Medical History:  Diagnosis Date  . Arthritis   . Chronic diastolic CHF (congestive heart failure), NYHA class 2 ([place])   . HTN (hypertension)   . Hypercholesterolemia   . Hypothyroidism (acquired)   . Myocardial infarction ([place])   . Thyrotoxicosis   . TIA (transient ischemic attack)     Patient Active Problem List   Diagnosis Date Noted  . Sacroiliitis, not elsewhere classified ([place]) 04/24/2017  . Chronic right-sided low back pain with right-sided sciatica 01/12/2017  . Chronic bilateral thoracic back pain 01/12/2017  . Other spondylosis with radiculopathy, lumbar region 10/19/2016  . Thyroid disease   . Depression with anxiety 07/16/2016  . Chronic diastolic CHF (congestive heart failure) ([place]) 07/16/2016  . Aortic atherosclerosis ([place]) 11/24/2015  . Hypothyroidism (acquired)   . Unstable angina ([place]) 08/21/2015  . Chest pain   . Coronary artery disease   . CAD (coronary artery disease), native coronary artery 01/14/2015  . Hypertension 01/14/2015  . Hypercholesterolemia 01/14/2015  . Tobacco abuse 01/14/2015      Past Surgical History:  Procedure Laterality Date  . BREAST CYST EXCISION    . CARDIAC CATHETERIZATION N/A 08/24/2015   Procedure: Left Heart Cath and Coronary Angiography;  Surgeon: Jettie Booze, MD;  Location: [facility];  Service: Cardiovascular;  Laterality: N/A;  . CORONARY STENT PLACEMENT  1999  . KNEE SURGERY    . partial hysterec    . THYROIDECTOMY       OB History   None      Home Medications    Prior to Admission medications   Medication Sig Start Date End Date Taking? Authorizing Provider  ALPRAZolam [name]) 1 MG tablet TAKE 1 TABLET BY MOUTH BID 06/03/16  Yes [provider]  [facility] ASPIRIN LOW DOSE 81 MG EC tablet TAKE 1 TABLET BY MOUTH EVERY DAY 10/02/17  Yes Jettie Booze, MD  cyclobenzaprine (FLEXERIL) 10 MG tablet Take 1 tablet (10 mg total) by mouth 3 (three) times daily as needed for muscle spasms. 03/29/17  Yes Dondra Prader R, NP  estradiol (ESTRACE) 0.5 MG tablet Take 0.5 mg by mouth daily. 08/15/17  Yes [provider]  furosemide (LASIX) 40 MG tablet Take 40 mg by mouth daily.    Yes [provider]  isosorbide mononitrate (IMDUR) 30 MG 24 hr tablet Take 2 tablets (60 mg total) by mouth daily. 07/17/16  Yes [name], MD  metoprolol tartrate (LOPRESSOR) 25 MG tablet Take 1 tablet (25 mg total) by  mouth 2 (two) times daily. 07/17/16  Yes [name], MD  simvastatin (ZOCOR) 20 MG tablet Take 20 mg by mouth at bedtime. 01/03/15  Yes [provider]  spironolactone (ALDACTONE) 25 MG tablet Take 25 mg by mouth 2 (two) times daily. 01/03/15  Yes [provider]  albuterol (PROVENTIL HFA;VENTOLIN HFA) 108 (90 Base) MCG/ACT inhaler Inhale 2 puffs into the lungs every 4 (four) hours as needed for shortness of breath.  06/21/15 12/22/16  [provider]  cyclobenzaprine (FLEXERIL) 10 MG tablet Take 1 tablet (10 mg total) by mouth 3 (three) times daily as needed for muscle spasms. Patient not  taking: Reported on 11/04/2017 04/24/17   Newt Minion, MD  ibuprofen (ADVIL,MOTRIN) 800 MG tablet Take 1 tablet (800 mg total) by mouth 3 (three) times daily. Patient not taking: Reported on 11/03/2017 10/29/16   Lysbeth Penner, FNP  nitroGLYCERIN (NITROSTAT) 0.3 MG SL tablet TAKE 1 TABLET UNDER THE TONGUE EVERY 5 MINUTES AS NEEDED FOR CHEST PAIN 10/28/15   [provider]  traMADol (ULTRAM) 50 MG tablet Take 1 tablet (50 mg total) by mouth 2 (two) times daily. Patient not taking: Reported on 11/04/2017 12/05/16   [name], NP  traMADol (ULTRAM) 50 MG tablet Take 1 tablet (50 mg total) by mouth 2 (two) times daily. Patient not taking: Reported on 11/04/2017 04/12/17   Newt Minion, MD  traMADol (ULTRAM) 50 MG tablet TAKE 1 TABLET BY MOUTH TWICE A DAY AS NEEDED Patient not taking: Reported on 11/04/2017 06/27/17   Newt Minion, MD  triamcinolone cream (KENALOG) 0.5 % Apply 1 application topically daily as needed (for sun exposure).    [provider]    Family History Family History  Problem Relation Age of Onset  . Heart disease Mother   . Stroke Mother   . Hypertension Mother   . Heart disease Father   . Cancer Father   . Graves' disease Father   . Stroke Brother   . Hypertension Maternal Grandmother   . Heart attack Neg Hx     Social History Social History   Tobacco Use  . Smoking status: Current Every Day Smoker    Packs/day: 0.50    Types: Cigarettes  . Smokeless tobacco: Never Used  Substance Use Topics  . Alcohol use: No    Alcohol/week: 0.0 oz  . Drug use: No     Allergies   Hydrocodone   Review of Systems Review of Systems  Cardiovascular: Positive for chest pain.  All other systems reviewed and are negative.    Physical Exam Updated Vital Signs BP 109/73   Pulse 79   Temp 98.3 F (36.8 C) (Oral)   Resp (!) 23   Ht 5\' 4"  (1.626 m)   Wt 95.3 kg (210 lb)   SpO2 98%   BMI 36.05 kg/m   Physical Exam  Constitutional: She appears  well-developed and well-nourished.  HENT:  Head: Normocephalic and atraumatic.  Eyes: EOM are normal.  Neck: Normal range of motion.  Cardiovascular: Normal rate, regular rhythm and normal pulses.  Pulmonary/Chest: Effort normal.  Abdominal: Soft.  Musculoskeletal: Normal range of motion.       Right lower leg: Normal.  Neurological: She is alert.  Skin: Skin is warm.  Psychiatric: She has a normal mood and affect.  Nursing note and vitals reviewed.    ED Treatments / Results  Labs (all labs ordered are listed, but only abnormal results are displayed) Labs Reviewed  BASIC METABOLIC PANEL - Abnormal; Notable for the following components:      Result Value   Creatinine, Ser 1.02 (*)    All other components within normal limits  CBC  TROPONIN I  I-STAT TROPONIN, ED    EKG EKG Interpretation  Date/Time:  Saturday November 04 2017 15:51:07 EDT Ventricular Rate:  82 PR Interval:  150 QRS Duration: 82 QT Interval:  398 QTC Calculation: 464 R Axis:   51 Text Interpretation:  Normal sinus rhythm Possible Left atrial enlargement Possible Inferior infarct , age undetermined Abnormal ECG poor baseline, no signifint change from prior 12/17 Confirmed by Aletta Edouard [phone]) on 11/04/2017 6:03:20 PM   Radiology Dg Chest 2 View  Result Date: 11/04/2017 CLINICAL DATA:  Chest pain radiating to the left arm for 3 hours. EXAM: CHEST - 2 VIEW COMPARISON:  Dec 05, 2016 FINDINGS: The heart size and mediastinal contours are within normal limits. There is no focal infiltrate, pulmonary edema, or pleural effusion. The visualized skeletal structures are unremarkable. IMPRESSION: No active cardiopulmonary disease. Electronically Signed   By: Abelardo Diesel M.D.   On: 11/04/2017 16:59    Procedures Procedures (including critical care time)  Medications Ordered in ED Medications - No data to display   Initial Impression / Assessment and Plan / ED Course  I have reviewed the triage vital signs  and the nursing notes.  Pertinent labs & imaging results that were available during my care of the patient were reviewed by me and considered in my medical decision making (see chart for details).  Clinical Course as of Nov 04 1945  Sat Nov 04, 2017  1758 ED EKG within 10 minutes [LS]    Clinical Course User Index [LS] Fransico Meadow, PA-C    MDM  EKg is unchanged.  Chest xray is reviewed and is normal.  Pt has 2 negative troponins.   Pt has remained chest pain free.  Pt feels well and wants to go home.  Pt feels certain her pain is from overexertion.   Pt advised to call her cardiologist on Monday to schedule to be seen for recheck.   Final Clinical Impressions(s) / ED Diagnoses   Final diagnoses:  Chest pain, unspecified type    ED Discharge Orders    None    An After Visit Summary was printed and given to the patient.    Sidney Ace 11/04/17 2214    Hayden Rasmussen, MD 11/06/17 1228

## 2017-11-04 NOTE — ED Triage Notes (Signed)
Pt decided to go to ED for further treatment

## 2017-11-07 DIAGNOSIS — R7989 Other specified abnormal findings of blood chemistry: Secondary | ICD-10-CM | POA: Diagnosis not present

## 2017-11-07 DIAGNOSIS — Z5181 Encounter for therapeutic drug level monitoring: Secondary | ICD-10-CM | POA: Diagnosis not present

## 2017-11-07 DIAGNOSIS — E785 Hyperlipidemia, unspecified: Secondary | ICD-10-CM | POA: Diagnosis not present

## 2017-11-07 DIAGNOSIS — I2510 Atherosclerotic heart disease of native coronary artery without angina pectoris: Secondary | ICD-10-CM | POA: Diagnosis not present

## 2017-11-07 DIAGNOSIS — I10 Essential (primary) hypertension: Secondary | ICD-10-CM | POA: Diagnosis not present

## 2017-12-11 ENCOUNTER — Ambulatory Visit (INDEPENDENT_AMBULATORY_CARE_PROVIDER_SITE_OTHER): Payer: Medicare Other | Admitting: Endocrinology

## 2017-12-11 ENCOUNTER — Encounter: Payer: Self-pay | Admitting: Endocrinology

## 2017-12-11 DIAGNOSIS — E0590 Thyrotoxicosis, unspecified without thyrotoxic crisis or storm: Secondary | ICD-10-CM

## 2017-12-11 LAB — TSH: TSH: <0.01 u[IU]/mL — ABNORMAL LOW (ref 0.35–4.50)

## 2017-12-11 LAB — T4, FREE: FREE T4: 1.02 ng/dL (ref 0.60–1.60)

## 2017-12-11 NOTE — Progress Notes (Signed)
Subjective:    Patient ID: Jocelyn [name], female    DOB: [birth year], 57 y.o.   MRN: [id number]  HPI Pt returns for f/u of hyperthyroidism (she had partial thyroidectomy in 1989; she then intermittently took synthroid intermittently; she has not recently had had thyroid imaging; synthroid was stopped in early 2019, due to suppressed TSH). Since off synthroid, she feels no better: she reports anxiety, slight tremor, and fatigue.   Past Medical History:  Diagnosis Date  . Arthritis   . Chronic diastolic CHF (congestive heart failure), NYHA class 2 ([place])   . HTN (hypertension)   . Hypercholesterolemia   . Hypothyroidism (acquired)   . Myocardial infarction ([place])   . Thyrotoxicosis   . TIA (transient ischemic attack)     Past Surgical History:  Procedure Laterality Date  . BREAST CYST EXCISION    . CARDIAC CATHETERIZATION N/A 08/24/2015   Procedure: Left Heart Cath and Coronary Angiography;  Surgeon: Jettie Booze, MD;  Location: [facility];  Service: Cardiovascular;  Laterality: N/A;  . CORONARY STENT PLACEMENT  1999  . KNEE SURGERY    . partial hysterec    . THYROIDECTOMY      Social History   Socioeconomic History  . Marital status: Legally Separated    Spouse name: Not on file  . Number of children: 2  . Years of education: Not on file  . Highest education level: Not on file  Occupational History  . Occupation: [employer]  Social Needs  . Financial resource strain: Not on file  . Food insecurity:    Worry: Not on file    Inability: Not on file  . Transportation needs:    Medical: Not on file    Non-medical: Not on file  Tobacco Use  . Smoking status: Current Every Day Smoker    Packs/day: 0.50    Types: Cigarettes  . Smokeless tobacco: Never Used  Substance and Sexual Activity  . Alcohol use: No    Alcohol/week: 0.0 oz  . Drug use: No  . Sexual activity: Not on file  Lifestyle  . Physical activity:    Days per week: Not on file    Minutes per  session: Not on file  . Stress: Not on file  Relationships  . Social connections:    Talks on phone: Not on file    Gets together: Not on file    Attends religious service: Not on file    Active member of club or organization: Not on file    Attends meetings of clubs or organizations: Not on file    Relationship status: Not on file  . Intimate partner violence:    Fear of current or ex partner: Not on file    Emotionally abused: Not on file    Physically abused: Not on file    Forced sexual activity: Not on file  Other Topics Concern  . Not on file  Social History Narrative  . Not on file    Current Outpatient Medications on File Prior to Visit  Medication Sig Dispense Refill  . ALPRAZolam (XANAX) 1 MG tablet TAKE 1 TABLET BY MOUTH BID  1  . [facility] ASPIRIN LOW DOSE 81 MG EC tablet TAKE 1 TABLET BY MOUTH EVERY DAY 90 tablet 0  . cyclobenzaprine (FLEXERIL) 10 MG tablet Take 1 tablet (10 mg total) by mouth 3 (three) times daily as needed for muscle spasms. 21 tablet 0  . cyclobenzaprine (FLEXERIL) 10 MG tablet Take 1  tablet (10 mg total) by mouth 3 (three) times daily as needed for muscle spasms. 30 tablet 0  . estradiol (ESTRACE) 0.5 MG tablet Take 0.5 mg by mouth daily.  0  . furosemide (LASIX) 40 MG tablet Take 40 mg by mouth daily.     Marland Kitchen ibuprofen (ADVIL,MOTRIN) 800 MG tablet Take 1 tablet (800 mg total) by mouth 3 (three) times daily. 21 tablet 0  . isosorbide mononitrate (IMDUR) 30 MG 24 hr tablet Take 2 tablets (60 mg total) by mouth daily. 30 tablet 5  . metoprolol tartrate (LOPRESSOR) 25 MG tablet Take 1 tablet (25 mg total) by mouth 2 (two) times daily. 30 tablet 2  . nitroGLYCERIN (NITROSTAT) 0.3 MG SL tablet TAKE 1 TABLET UNDER THE TONGUE EVERY 5 MINUTES AS NEEDED FOR CHEST PAIN  0  . simvastatin (ZOCOR) 20 MG tablet Take 20 mg by mouth at bedtime.  1  . spironolactone (ALDACTONE) 25 MG tablet Take 25 mg by mouth 2 (two) times daily.  1  . traMADol (ULTRAM) 50 MG tablet Take  1 tablet (50 mg total) by mouth 2 (two) times daily. 28 tablet 0  . traMADol (ULTRAM) 50 MG tablet Take 1 tablet (50 mg total) by mouth 2 (two) times daily. 40 tablet 0  . traMADol (ULTRAM) 50 MG tablet TAKE 1 TABLET BY MOUTH TWICE A DAY AS NEEDED 40 tablet 0  . triamcinolone cream (KENALOG) 0.5 % Apply 1 application topically daily as needed (for sun exposure).    Marland Kitchen albuterol (PROVENTIL HFA;VENTOLIN HFA) 108 (90 Base) MCG/ACT inhaler Inhale 2 puffs into the lungs every 4 (four) hours as needed for shortness of breath.      No current facility-administered medications on file prior to visit.     Allergies  Allergen Reactions  . Hydrocodone Nausea Only    Family History  Problem Relation Age of Onset  . Heart disease Mother   . Stroke Mother   . Hypertension Mother   . Heart disease Father   . Cancer Father   . Graves' disease Father   . Stroke Brother   . Hypertension Maternal Grandmother   . Heart attack Neg Hx     BP 114/68   Pulse 100   Wt 214 lb 9.6 oz (97.3 kg)   SpO2 97%   BMI 36.84 kg/m    Review of Systems Denies palpitations    Objective:   Physical Exam VITAL SIGNS:  See vs page GENERAL: no distress Neck: a healed scar is present.  I do not appreciate a nodule in the thyroid or elsewhere in the neck   Lab Results  Component Value Date   TSH <0.01 (L) 12/11/2017      Assessment & Plan:  Hyperthyroidism, persistent off synthroid.  We discussed options.  Pt chooses [facility], and I agree.  I ordered nuc med scan

## 2017-12-11 NOTE — Patient Instructions (Addendum)
blood tests are requested for you today.  We'll let you know about the results. If it is overactive, you have 2 options: take a daily medication to slow it down, or take a radioactive iodine pill, which works like this:  let's check a thyroid "scan" (a special, but easy and painless type of thyroid x ray).  you go to the x-ray department of the hospital to swallow a pill, which contains a miniscule amount of radiation.  You will not notice any symptoms from this.  You will go back to the x-ray department the next day, to lie down in front of a camera.  The results of this will be sent to me.   Based on the results, i hope to order for you a treatment pill of radioactive iodine.  Although it is a larger amount of radiation, you will again notice no symptoms from this.  The pill is gone from your body in a few days (during which you should stay away from other people), but takes several months to work.  Therefore, please return here approximately 6-8 weeks after the treatment.  This treatment has been available for many years, and the only known side-effect is an underactive thyroid.  It is possible that i would eventually prescribe for you a thyroid hormone pill, which is very inexpensive.  You don't have to worry about side-effects of this thyroid hormone pill, because it is the same molecule your thyroid makes.

## 2017-12-13 DIAGNOSIS — E039 Hypothyroidism, unspecified: Secondary | ICD-10-CM | POA: Diagnosis not present

## 2017-12-13 DIAGNOSIS — I10 Essential (primary) hypertension: Secondary | ICD-10-CM | POA: Diagnosis not present

## 2017-12-28 DIAGNOSIS — R7303 Prediabetes: Secondary | ICD-10-CM | POA: Diagnosis not present

## 2018-01-08 DIAGNOSIS — A048 Other specified bacterial intestinal infections: Secondary | ICD-10-CM | POA: Diagnosis not present

## 2018-01-08 DIAGNOSIS — E785 Hyperlipidemia, unspecified: Secondary | ICD-10-CM | POA: Diagnosis not present

## 2018-01-08 DIAGNOSIS — I2510 Atherosclerotic heart disease of native coronary artery without angina pectoris: Secondary | ICD-10-CM | POA: Diagnosis not present

## 2018-01-08 DIAGNOSIS — Z5181 Encounter for therapeutic drug level monitoring: Secondary | ICD-10-CM | POA: Diagnosis not present

## 2018-01-24 DIAGNOSIS — R7303 Prediabetes: Secondary | ICD-10-CM | POA: Diagnosis not present

## 2018-01-24 DIAGNOSIS — I10 Essential (primary) hypertension: Secondary | ICD-10-CM | POA: Diagnosis not present

## 2018-01-24 DIAGNOSIS — A048 Other specified bacterial intestinal infections: Secondary | ICD-10-CM | POA: Diagnosis not present

## 2018-01-24 DIAGNOSIS — R1013 Epigastric pain: Secondary | ICD-10-CM | POA: Diagnosis not present

## 2018-02-12 DIAGNOSIS — I10 Essential (primary) hypertension: Secondary | ICD-10-CM | POA: Diagnosis not present

## 2018-02-12 DIAGNOSIS — M545 Low back pain: Secondary | ICD-10-CM | POA: Diagnosis not present

## 2018-03-23 ENCOUNTER — Telehealth: Payer: Self-pay

## 2018-03-23 DIAGNOSIS — E0590 Thyrotoxicosis, unspecified without thyrotoxic crisis or storm: Secondary | ICD-10-CM

## 2018-03-23 NOTE — Telephone Encounter (Signed)
Ok, I reordered

## 2018-03-23 NOTE — Telephone Encounter (Addendum)
Orders have been placed for thyroid uptake and scan but it is an old order it needs to say multiple [id number]- please advise

## 2018-03-23 NOTE — Addendum Note (Signed)
Addended by: [name] on: 03/23/2018 04:01 PM   Modules accepted: Orders

## 2018-03-26 DIAGNOSIS — M545 Low back pain: Secondary | ICD-10-CM | POA: Diagnosis not present

## 2018-03-26 DIAGNOSIS — I10 Essential (primary) hypertension: Secondary | ICD-10-CM | POA: Diagnosis not present

## 2018-04-09 DIAGNOSIS — Z124 Encounter for screening for malignant neoplasm of cervix: Secondary | ICD-10-CM | POA: Diagnosis not present

## 2018-04-09 DIAGNOSIS — M545 Low back pain: Secondary | ICD-10-CM | POA: Diagnosis not present

## 2018-04-16 ENCOUNTER — Encounter: Payer: Self-pay | Admitting: Gastroenterology

## 2018-04-16 ENCOUNTER — Encounter (HOSPITAL_COMMUNITY): Admission: RE | Admit: 2018-04-16 | Payer: Medicare Other | Source: Ambulatory Visit

## 2018-04-16 ENCOUNTER — Encounter (HOSPITAL_COMMUNITY): Payer: Medicare Other

## 2018-04-17 ENCOUNTER — Encounter (HOSPITAL_COMMUNITY): Payer: Medicare Other

## 2018-04-19 ENCOUNTER — Telehealth: Payer: Self-pay | Admitting: Endocrinology

## 2018-04-19 NOTE — Telephone Encounter (Signed)
Jocelyn [name] with [name] ph# [phone] needs a Pre Cert for thyroid uptake scheduled for Tuesday 04/24/18 (patient already had to be rescheduled due to not having  A pre cert on file)

## 2018-04-20 ENCOUNTER — Ambulatory Visit (AMBULATORY_SURGERY_CENTER): Payer: Self-pay | Admitting: *Deleted

## 2018-04-20 VITALS — Ht 64.0 in | Wt 215.0 lb

## 2018-04-20 DIAGNOSIS — Z1211 Encounter for screening for malignant neoplasm of colon: Secondary | ICD-10-CM

## 2018-04-20 MED ORDER — NA SULFATE-K SULFATE-MG SULF 17.5-3.13-1.6 GM/177ML PO SOLN: ORAL | 0 refills | Status: DC

## 2018-04-20 NOTE — Progress Notes (Signed)
No egg or soy allergy  No home oxygen used or hx of sleep apnea  No intubation problems or anesthesia problems per pt  No diet medications taken  Pt states she had a colonoscopy more than 10 years ago in [place]. States she is unsure of polyps.  She also says he father had colon CA but was dx in his 77's.  She knows to take her medications the day of her procedure;as long as they are taken by 5:30 a.m.

## 2018-04-23 ENCOUNTER — Telehealth: Payer: Self-pay | Admitting: Endocrinology

## 2018-04-23 DIAGNOSIS — E0590 Thyrotoxicosis, unspecified without thyrotoxic crisis or storm: Secondary | ICD-10-CM

## 2018-04-23 NOTE — Telephone Encounter (Signed)
Jocelyn [name] for Jocelyn [name]- ph# [phone] re: Patient has appointment for thyroid uptake scheduled for tomorrow 04/24/18. They have left message on 04/19/18-patient has had to be rescheduled once before due to they do not have the precertification. Please call the ph# listed herein.

## 2018-04-23 NOTE — Telephone Encounter (Signed)
-----   Message from April H Pait sent at 04/23/2018  3:03 PM EDT ----- Regarding: preauth for pt Hello, This pt is scheduled for her nuc med thyroid scan tomorrow.  We have had to reschedule her from last week due to no preauth with her insurance.  Can you please check with your office staff regarding this?  [facility] has left multiple messages about this with no return call. Thank you! April

## 2018-04-24 ENCOUNTER — Encounter (HOSPITAL_COMMUNITY): Payer: Medicare Other

## 2018-04-24 ENCOUNTER — Telehealth: Payer: Self-pay | Admitting: Endocrinology

## 2018-04-24 NOTE — Telephone Encounter (Signed)
-----   Message from Jeannetta Nap, [place] sent at 04/24/2018  8:08 AM EDT ----- Regarding: RE: preauth for pt I will do her pre-auth today but the person in our office has not been able to get to many of our pre-auths for these services, unfortunately pt will have to reschedule again.  ----- Message ----- From: [name], MD Sent: 04/23/2018   4:45 PM EDT To: Jeannetta Nap, CMA Subject: FW: preauth for pt                               ----- Message ----- From: Belva Chimes H Sent: 04/23/2018   3:03 PM EDT To: [name], MD Subject: preauth for pt                                 Hello, This pt is scheduled for her nuc med thyroid scan tomorrow.  We have had to reschedule her from last week due to no preauth with her insurance.  Can you please check with your office staff regarding this?  [facility] has left multiple messages about this with no return call. Thank you! April

## 2018-04-25 ENCOUNTER — Encounter (HOSPITAL_COMMUNITY): Payer: Medicare Other

## 2018-04-30 ENCOUNTER — Ambulatory Visit (AMBULATORY_SURGERY_CENTER): Payer: Medicare Other | Admitting: Gastroenterology

## 2018-04-30 ENCOUNTER — Encounter: Payer: Self-pay | Admitting: Gastroenterology

## 2018-04-30 VITALS — BP 117/79 | HR 77 | Temp 97.5°F | Resp 24 | Ht 64.0 in | Wt 214.0 lb

## 2018-04-30 DIAGNOSIS — Z800 Family history of malignant neoplasm of digestive organs: Secondary | ICD-10-CM

## 2018-04-30 DIAGNOSIS — Z1211 Encounter for screening for malignant neoplasm of colon: Secondary | ICD-10-CM | POA: Diagnosis not present

## 2018-04-30 DIAGNOSIS — K635 Polyp of colon: Secondary | ICD-10-CM

## 2018-04-30 DIAGNOSIS — K5150 Left sided colitis without complications: Secondary | ICD-10-CM | POA: Diagnosis not present

## 2018-04-30 DIAGNOSIS — K6389 Other specified diseases of intestine: Secondary | ICD-10-CM | POA: Diagnosis not present

## 2018-04-30 DIAGNOSIS — D123 Benign neoplasm of transverse colon: Secondary | ICD-10-CM | POA: Diagnosis not present

## 2018-04-30 DIAGNOSIS — K529 Noninfective gastroenteritis and colitis, unspecified: Secondary | ICD-10-CM

## 2018-04-30 MED ORDER — SODIUM CHLORIDE 0.9 % IV SOLN: 500.0000 mL | Freq: Once | INTRAVENOUS | Status: DC

## 2018-04-30 NOTE — Progress Notes (Signed)
To PACU, VSS. Report to [email or web address].tb 

## 2018-04-30 NOTE — Op Note (Signed)
[facility] Patient Name: Jocelyn [name] Procedure Date: 04/30/2018 8:34 AM MRN: [id number] Endoscopist: Remo Lipps Jocelyn [name] , MD Age: 57 Referring MD:  Date of Birth: Mar 30, 1961 Gender: Female Account #: [id number] Procedure:                Colonoscopy Indications:              Screening patient at increased risk: Family history                            of 1st-degree relative with colorectal cancer                            (Father diagnosed age 8s) Medicines:                Monitored Anesthesia Care Procedure:                Pre-Anesthesia Assessment:                           - Prior to the procedure, a History and Physical                            was performed, and patient medications and                            allergies were reviewed. The patient's tolerance of                            previous anesthesia was also reviewed. The risks                            and benefits of the procedure and the sedation                            options and risks were discussed with the patient.                            All questions were answered, and informed consent                            was obtained. Prior Anticoagulants: The patient has                            taken no previous anticoagulant or antiplatelet                            agents. ASA Grade Assessment: III - A patient with                            severe systemic disease. After reviewing the risks                            and benefits, the patient was deemed in  satisfactory condition to undergo the procedure.                           After obtaining informed consent, the colonoscope                            was passed under direct vision. Throughout the                            procedure, the patient's blood pressure, pulse, and                            oxygen saturations were monitored continuously. The                            Colonoscope was introduced  through the anus and                            advanced to the the terminal ileum, with                            identification of the appendiceal orifice and IC                            valve. The colonoscopy was performed without                            difficulty. The patient tolerated the procedure                            well. The quality of the bowel preparation was                            good. The terminal ileum, ileocecal valve,                            appendiceal orifice, and rectum were photographed. Scope In: 8:39:03 AM Scope Out: 8:57:52 AM Scope Withdrawal Time: 0 hours 15 minutes 14 seconds  Total Procedure Duration: 0 hours 18 minutes 49 seconds  Findings:                 The perianal and digital rectal examinations were                            normal.                           The terminal ileum appeared normal.                           A 5 mm polyp was found in the transverse colon. The                            polyp was flat. The polyp was removed with a cold  snare. Resection and retrieval were complete.                           A localized area of moderately erythematous mucosa                            with altered vascularity was found in the                            descending colon. Biopsies were taken with a cold                            forceps for histology.                           The exam was otherwise without abnormality. Complications:            No immediate complications. Estimated blood loss:                            Minimal. Estimated Blood Loss:     Estimated blood loss was minimal. Impression:               - The examined portion of the ileum was normal.                           - One 5 mm polyp in the transverse colon, removed                            with a cold snare. Resected and retrieved.                           - Erythematous mucosa in the descending colon.                             Biopsied.                           - The examination was otherwise normal. Recommendation:           - Patient has a contact number available for                            emergencies. The signs and symptoms of potential                            delayed complications were discussed with the                            patient. Return to normal activities tomorrow.                            Written discharge instructions were provided to the                            patient.                           -  Resume previous diet.                           - Continue present medications.                           - Await pathology results.                           - Repeat colonoscopy for surveillance based on                            pathology results. Remo Lipps P. Jocelyn Monceaux, MD 04/30/2018 9:02:54 AM This report has been signed electronically.

## 2018-04-30 NOTE — Progress Notes (Signed)
Pt's states no medical or surgical changes since previsit or office visit. 

## 2018-04-30 NOTE — Progress Notes (Signed)
Called to room to assist during endoscopic procedure.  Patient ID and intended procedure confirmed with present staff. Received instructions for my participation in the procedure from the performing physician.  

## 2018-04-30 NOTE — Patient Instructions (Signed)
[place] given on polyps.   YOU HAD AN ENDOSCOPIC PROCEDURE TODAY AT [facility]:   Refer to the procedure report that was given to you for any specific questions about what was found during the examination.  If the procedure report does not answer your questions, please call your gastroenterologist to clarify.  If you requested that your care partner not be given the details of your procedure findings, then the procedure report has been included in a sealed envelope for you to review at your convenience later.  YOU SHOULD EXPECT: Some feelings of bloating in the abdomen. Passage of more gas than usual.  Walking can help get rid of the air that was put into your GI tract during the procedure and reduce the bloating. If you had a lower endoscopy (such as a colonoscopy or flexible sigmoidoscopy) you may notice spotting of blood in your stool or on the toilet paper. If you underwent a bowel prep for your procedure, you may not have a normal bowel movement for a few days.  Please Note:  You might notice some irritation and congestion in your nose or some drainage.  This is from the oxygen used during your procedure.  There is no need for concern and it should clear up in a day or so.  SYMPTOMS TO REPORT IMMEDIATELY:   Following lower endoscopy (colonoscopy or flexible sigmoidoscopy):  Excessive amounts of blood in the stool  Significant tenderness or worsening of abdominal pains  Swelling of the abdomen that is new, acute  Fever of 100F or higher   For urgent or emergent issues, a gastroenterologist can be reached at any hour by calling [phone].   DIET:  We do recommend a small meal at first, but then you may proceed to your regular diet.  Drink plenty of fluids but you should avoid alcoholic beverages for 24 hours.  ACTIVITY:  You should plan to take it easy for the rest of today and you should NOT DRIVE or use heavy machinery until tomorrow (because of the sedation  medicines used during the test).    FOLLOW UP: Our staff will call the number listed on your records the next business day following your procedure to check on you and address any questions or concerns that you may have regarding the information given to you following your procedure. If we do not reach you, we will leave a message.  However, if you are feeling well and you are not experiencing any problems, there is no need to return our call.  We will assume that you have returned to your regular daily activities without incident.  If any biopsies were taken you will be contacted by phone or by letter within the next 1-3 weeks.  Please call us at [phone] if you have not heard about the biopsies in 3 weeks.    SIGNATURES/CONFIDENTIALITY: You and/or your care partner have signed paperwork which will be entered into your electronic medical record.  These signatures attest to the fact that that the information above on your After Visit Summary has been reviewed and is understood.  Full responsibility of the confidentiality of this discharge information lies with you and/or your care-partner.

## 2018-05-01 ENCOUNTER — Telehealth: Payer: Self-pay

## 2018-05-01 DIAGNOSIS — Z79891 Long term (current) use of opiate analgesic: Secondary | ICD-10-CM | POA: Diagnosis not present

## 2018-05-01 NOTE — Telephone Encounter (Signed)
  Follow up Call-  Call back number 04/30/2018  Post procedure Call Back phone  # [phone]  Permission to leave phone message Yes  Some recent data might be hidden     Patient questions:  Do you have a fever, pain , or abdominal swelling? No. Pain Score  0 *  Have you tolerated food without any problems? Yes.    Have you been able to return to your normal activities? Yes.    Do you have any questions about your discharge instructions: Diet   No. Medications  No. Follow up visit  No.  Do you have questions or concerns about your Care? No.  Actions: * If pain score is 4 or above: No action needed, pain <4.

## 2018-05-02 NOTE — Telephone Encounter (Signed)
We do not have any one in the office doing our pre-auths at this time

## 2018-05-02 NOTE — Telephone Encounter (Signed)
This is not covered under Medicaid, spoke to [facility] scheduler Lovena Le to see what we could get it covered under and she said to try thyroidtoxicosis. Please advise.

## 2018-05-02 NOTE — Addendum Note (Signed)
Addended by: [name] on: 05/02/2018 12:48 PM   Modules accepted: Orders

## 2018-05-02 NOTE — Telephone Encounter (Signed)
Ok, I reordered under thyrotoxicosis

## 2018-05-03 NOTE — Telephone Encounter (Signed)
Procedure is not covered under pt insurance

## 2018-05-03 NOTE — Telephone Encounter (Signed)
Attempted to call Ula Lingo back but her mailbox was full. This procedure is not covered under this pt's insurance

## 2018-05-07 ENCOUNTER — Ambulatory Visit: Payer: Medicare Other | Admitting: Endocrinology

## 2018-05-07 ENCOUNTER — Other Ambulatory Visit (HOSPITAL_COMMUNITY): Payer: Medicare Other

## 2018-05-07 ENCOUNTER — Encounter (HOSPITAL_COMMUNITY): Payer: Medicare Other

## 2018-05-08 ENCOUNTER — Other Ambulatory Visit (HOSPITAL_COMMUNITY): Payer: Medicare Other

## 2018-05-08 ENCOUNTER — Encounter: Payer: Self-pay | Admitting: Gastroenterology

## 2018-05-09 DIAGNOSIS — R1013 Epigastric pain: Secondary | ICD-10-CM | POA: Diagnosis not present

## 2018-05-09 DIAGNOSIS — I10 Essential (primary) hypertension: Secondary | ICD-10-CM | POA: Diagnosis not present

## 2018-05-14 ENCOUNTER — Encounter (HOSPITAL_COMMUNITY)
Admission: RE | Admit: 2018-05-14 | Discharge: 2018-05-14 | Disposition: A | Discharge status: Home / Self Care | Payer: Medicare Other | Source: Ambulatory Visit | Attending: Endocrinology | Admitting: Endocrinology

## 2018-05-14 DIAGNOSIS — E0590 Thyrotoxicosis, unspecified without thyrotoxic crisis or storm: Secondary | ICD-10-CM | POA: Diagnosis not present

## 2018-05-14 MED ORDER — SODIUM IODIDE I-123 7.4 MBQ CAPS
410.9000 | ORAL_CAPSULE | Freq: Once | ORAL | Status: AC
  Administered 2018-05-14: 410.9 via ORAL

## 2018-05-15 ENCOUNTER — Other Ambulatory Visit: Payer: Self-pay | Admitting: Endocrinology

## 2018-05-15 ENCOUNTER — Encounter (HOSPITAL_COMMUNITY)
Admission: RE | Admit: 2018-05-15 | Discharge: 2018-05-15 | Disposition: A | Discharge status: Home / Self Care | Payer: Medicare Other | Source: Ambulatory Visit | Attending: Endocrinology | Admitting: Endocrinology

## 2018-05-15 DIAGNOSIS — R002 Palpitations: Secondary | ICD-10-CM | POA: Diagnosis not present

## 2018-05-15 DIAGNOSIS — R5383 Other fatigue: Secondary | ICD-10-CM | POA: Diagnosis not present

## 2018-05-15 DIAGNOSIS — E0590 Thyrotoxicosis, unspecified without thyrotoxic crisis or storm: Secondary | ICD-10-CM

## 2018-05-17 ENCOUNTER — Ambulatory Visit
Admission: RE | Admit: 2018-05-17 | Discharge: 2018-05-17 | Disposition: A | Discharge status: Home / Self Care | Payer: Medicare Other | Source: Ambulatory Visit | Attending: Endocrinology | Admitting: Endocrinology

## 2018-05-17 ENCOUNTER — Other Ambulatory Visit: Payer: Self-pay | Admitting: Endocrinology

## 2018-05-17 DIAGNOSIS — E0590 Thyrotoxicosis, unspecified without thyrotoxic crisis or storm: Secondary | ICD-10-CM

## 2018-05-17 DIAGNOSIS — E039 Hypothyroidism, unspecified: Secondary | ICD-10-CM | POA: Diagnosis not present

## 2018-05-31 ENCOUNTER — Other Ambulatory Visit (HOSPITAL_COMMUNITY): Payer: Medicare Other

## 2018-05-31 ENCOUNTER — Ambulatory Visit (HOSPITAL_COMMUNITY): Payer: Medicare Other

## 2018-05-31 DIAGNOSIS — M545 Low back pain: Secondary | ICD-10-CM | POA: Diagnosis not present

## 2018-05-31 DIAGNOSIS — I10 Essential (primary) hypertension: Secondary | ICD-10-CM | POA: Diagnosis not present

## 2018-05-31 DIAGNOSIS — Z79899 Other long term (current) drug therapy: Secondary | ICD-10-CM | POA: Diagnosis not present

## 2018-05-31 DIAGNOSIS — I25118 Atherosclerotic heart disease of native coronary artery with other forms of angina pectoris: Secondary | ICD-10-CM | POA: Diagnosis not present

## 2018-05-31 DIAGNOSIS — E785 Hyperlipidemia, unspecified: Secondary | ICD-10-CM | POA: Diagnosis not present

## 2018-06-01 ENCOUNTER — Other Ambulatory Visit (HOSPITAL_COMMUNITY): Payer: Medicare Other

## 2018-06-08 ENCOUNTER — Encounter (HOSPITAL_COMMUNITY)
Admission: RE | Admit: 2018-06-08 | Discharge: 2018-06-08 | Disposition: A | Discharge status: Home / Self Care | Payer: Medicare Other | Source: Ambulatory Visit | Attending: Endocrinology | Admitting: Endocrinology

## 2018-06-08 ENCOUNTER — Ambulatory Visit (INDEPENDENT_AMBULATORY_CARE_PROVIDER_SITE_OTHER): Payer: Medicare Other | Admitting: Endocrinology

## 2018-06-08 ENCOUNTER — Encounter: Payer: Self-pay | Admitting: Endocrinology

## 2018-06-08 VITALS — BP 108/60 | HR 97 | Ht 64.0 in | Wt 212.0 lb

## 2018-06-08 DIAGNOSIS — E0590 Thyrotoxicosis, unspecified without thyrotoxic crisis or storm: Secondary | ICD-10-CM

## 2018-06-08 MED ORDER — SODIUM IODIDE I 131 CAPSULE
30.0000 | Freq: Once | INTRAVENOUS | Status: AC | PRN
  Administered 2018-06-08: 30 via ORAL

## 2018-06-08 NOTE — Patient Instructions (Addendum)
Please take the pill today, as scheduled Please come back for a follow-up appointment in 1 month.

## 2018-06-08 NOTE — Progress Notes (Signed)
Subjective:    Patient ID: Jocelyn [name], female    DOB: Dec 01, 1960, 57 y.o.   MRN: [id number]  HPI Pt returns for f/u of hyperthyroidism (she had partial thyroidectomy in 1989; she then intermittently took synthroid intermittently; she has not recently had had thyroid imaging; synthroid was stopped in early 2019, due to suppressed TSH).  RAI rx is scheduled for today.  She has multiple questions.  pt states she feels well in general, except for anxiety.   Past Medical History:  Diagnosis Date  . Anxiety   . Arthritis   . Chronic diastolic CHF (congestive heart failure), NYHA class 2 ([place])   . Chronic kidney disease    kidney stones hx  . COPD (chronic obstructive pulmonary disease) ([place])   . Depression   . HTN (hypertension)   . Hypercholesterolemia   . Hypothyroidism (acquired)   . Myocardial infarction [facility])    x3- pt unsure if MIs were in 1996 or 1999, had stent fixed in 2016 but no MI at that time per pt  . Thyrotoxicosis   . TIA (transient ischemic attack)    2013    Past Surgical History:  Procedure Laterality Date  . BREAST CYST EXCISION    . CARDIAC CATHETERIZATION N/A 08/24/2015   Procedure: Left Heart Cath and Coronary Angiography;  Surgeon: Jettie Booze, MD;  Location: [facility];  Service: Cardiovascular;  Laterality: N/A;  . COLONOSCOPY    . [place]   and then had procedure to repair in 2016  . KNEE SURGERY    . partial hysterec    . THYROIDECTOMY      Social History   Socioeconomic History  . Marital status: Legally Separated    Spouse name: Not on file  . Number of children: 2  . Years of education: Not on file  . Highest education level: Not on file  Occupational History  . Occupation: [employer]  Social Needs  . Financial resource strain: Not on file  . Food insecurity:    Worry: Not on file    Inability: Not on file  . Transportation needs:    Medical: Not on file    Non-medical: Not on file  Tobacco Use  .  Smoking status: Current Every Day Smoker    Packs/day: 0.50    Types: Cigarettes  . Smokeless tobacco: Never Used  Substance and Sexual Activity  . Alcohol use: No    Alcohol/week: 0.0 standard drinks  . Drug use: No  . Sexual activity: Not on file  Lifestyle  . Physical activity:    Days per week: Not on file    Minutes per session: Not on file  . Stress: Not on file  Relationships  . Social connections:    Talks on phone: Not on file    Gets together: Not on file    Attends religious service: Not on file    Active member of club or organization: Not on file    Attends meetings of clubs or organizations: Not on file    Relationship status: Not on file  . Intimate partner violence:    Fear of current or ex partner: Not on file    Emotionally abused: Not on file    Physically abused: Not on file    Forced sexual activity: Not on file  Other Topics Concern  . Not on file  Social History Narrative  . Not on file    Current Outpatient Medications  on File Prior to Visit  Medication Sig Dispense Refill  . ALPRAZolam (XANAX) 1 MG tablet TAKE 1 TABLET BY MOUTH BID  1  . cyclobenzaprine (FLEXERIL) 10 MG tablet Take 1 tablet (10 mg total) by mouth 3 (three) times daily as needed for muscle spasms. 21 tablet 0  . estradiol (ESTRACE) 0.5 MG tablet Take 0.5 mg by mouth daily.  0  . furosemide (LASIX) 40 MG tablet Take 40 mg by mouth daily.     . isosorbide mononitrate (IMDUR) 30 MG 24 hr tablet Take 2 tablets (60 mg total) by mouth daily. 30 tablet 5  . metoprolol tartrate (LOPRESSOR) 25 MG tablet Take 1 tablet (25 mg total) by mouth 2 (two) times daily. 30 tablet 2  . nitroGLYCERIN (NITROSTAT) 0.3 MG SL tablet TAKE 1 TABLET UNDER THE TONGUE EVERY 5 MINUTES AS NEEDED FOR CHEST PAIN  0  . simvastatin (ZOCOR) 20 MG tablet Take 20 mg by mouth at bedtime.  1  . spironolactone (ALDACTONE) 25 MG tablet Take 25 mg by mouth 2 (two) times daily.  1  . triamcinolone cream (KENALOG) 0.5 % Apply 1  application topically daily as needed (for sun exposure).    Marland Kitchen albuterol (PROVENTIL HFA;VENTOLIN HFA) 108 (90 Base) MCG/ACT inhaler Inhale 2 puffs into the lungs every 4 (four) hours as needed for shortness of breath.     Marland Kitchen aspirin 325 MG tablet Take 325 mg by mouth daily.     No current facility-administered medications on file prior to visit.     Allergies  Allergen Reactions  . Hydrocodone Nausea Only    Has problems with high doses    Family History  Problem Relation Age of Onset  . Heart disease Mother   . Stroke Mother   . Hypertension Mother   . Heart disease Father   . Cancer Father   . Graves' disease Father   . Colon cancer Father        pt unsure of age onset, believes he was in his 5s  . Stroke Brother   . Hypertension Maternal Grandmother   . Heart attack Neg Hx   . Esophageal cancer Neg Hx   . Stomach cancer Neg Hx   . Rectal cancer Neg Hx     BP 108/60 (BP Location: Right Arm, Patient Position: Sitting, Cuff Size: Large)   Pulse 97   Ht 5\' 4"  (1.626 m)   Wt 212 lb (96.2 kg)   SpO2 94%   BMI 36.39 kg/m    Review of Systems No weight change.      Objective:   Physical Exam VITAL SIGNS:  See vs page GENERAL: no distress Neck: a healed scar is present.  I do not appreciate a nodule in the thyroid or elsewhere in the neck.        Assessment & Plan:  Hyperthyroidism: we discussed precautions, and decay of RAI isotope.  She agrees to go ahead with rx  Patient Instructions  Please take the pill today, as scheduled Please come back for a follow-up appointment in 1 month.

## 2018-06-19 DIAGNOSIS — I5032 Chronic diastolic (congestive) heart failure: Secondary | ICD-10-CM | POA: Diagnosis not present

## 2018-06-19 DIAGNOSIS — I25118 Atherosclerotic heart disease of native coronary artery with other forms of angina pectoris: Secondary | ICD-10-CM | POA: Diagnosis not present

## 2018-06-19 DIAGNOSIS — I110 Hypertensive heart disease with heart failure: Secondary | ICD-10-CM | POA: Diagnosis not present

## 2018-06-19 DIAGNOSIS — Z23 Encounter for immunization: Secondary | ICD-10-CM | POA: Diagnosis not present

## 2018-06-21 ENCOUNTER — Ambulatory Visit: Payer: Medicare Other | Admitting: Endocrinology

## 2018-07-09 DIAGNOSIS — H5213 Myopia, bilateral: Secondary | ICD-10-CM | POA: Diagnosis not present

## 2018-07-10 ENCOUNTER — Ambulatory Visit: Payer: Medicare Other | Admitting: Endocrinology

## 2018-08-05 ENCOUNTER — Emergency Department (HOSPITAL_COMMUNITY)
Admission: EM | Admit: 2018-08-05 | Discharge: 2018-08-05 | Disposition: A | Discharge status: Home / Self Care | Payer: Medicare Other | Attending: Emergency Medicine | Admitting: Emergency Medicine

## 2018-08-05 ENCOUNTER — Encounter (HOSPITAL_COMMUNITY): Payer: Self-pay

## 2018-08-05 ENCOUNTER — Other Ambulatory Visit: Payer: Self-pay

## 2018-08-05 ENCOUNTER — Emergency Department (HOSPITAL_COMMUNITY): Payer: Medicare Other

## 2018-08-05 DIAGNOSIS — Z8673 Personal history of transient ischemic attack (TIA), and cerebral infarction without residual deficits: Secondary | ICD-10-CM | POA: Diagnosis not present

## 2018-08-05 DIAGNOSIS — J9811 Atelectasis: Secondary | ICD-10-CM | POA: Diagnosis not present

## 2018-08-05 DIAGNOSIS — J209 Acute bronchitis, unspecified: Secondary | ICD-10-CM | POA: Diagnosis not present

## 2018-08-05 DIAGNOSIS — I5032 Chronic diastolic (congestive) heart failure: Secondary | ICD-10-CM | POA: Insufficient documentation

## 2018-08-05 DIAGNOSIS — I252 Old myocardial infarction: Secondary | ICD-10-CM | POA: Diagnosis not present

## 2018-08-05 DIAGNOSIS — F17210 Nicotine dependence, cigarettes, uncomplicated: Secondary | ICD-10-CM | POA: Diagnosis not present

## 2018-08-05 DIAGNOSIS — I2510 Atherosclerotic heart disease of native coronary artery without angina pectoris: Secondary | ICD-10-CM | POA: Insufficient documentation

## 2018-08-05 DIAGNOSIS — I10 Essential (primary) hypertension: Secondary | ICD-10-CM | POA: Insufficient documentation

## 2018-08-05 DIAGNOSIS — E7800 Pure hypercholesterolemia, unspecified: Secondary | ICD-10-CM | POA: Insufficient documentation

## 2018-08-05 DIAGNOSIS — R69 Illness, unspecified: Secondary | ICD-10-CM

## 2018-08-05 DIAGNOSIS — E039 Hypothyroidism, unspecified: Secondary | ICD-10-CM | POA: Diagnosis not present

## 2018-08-05 DIAGNOSIS — Z7982 Long term (current) use of aspirin: Secondary | ICD-10-CM | POA: Insufficient documentation

## 2018-08-05 DIAGNOSIS — R0602 Shortness of breath: Secondary | ICD-10-CM | POA: Diagnosis present

## 2018-08-05 DIAGNOSIS — Z79899 Other long term (current) drug therapy: Secondary | ICD-10-CM | POA: Insufficient documentation

## 2018-08-05 DIAGNOSIS — J9801 Acute bronchospasm: Secondary | ICD-10-CM | POA: Diagnosis not present

## 2018-08-05 DIAGNOSIS — J449 Chronic obstructive pulmonary disease, unspecified: Secondary | ICD-10-CM | POA: Diagnosis not present

## 2018-08-05 DIAGNOSIS — J111 Influenza due to unidentified influenza virus with other respiratory manifestations: Secondary | ICD-10-CM

## 2018-08-05 LAB — CBC
HCT: 43 % (ref 36.0–46.0)
HEMOGLOBIN: 14.4 g/dL (ref 12.0–15.0)
MCH: 29.3 pg (ref 26.0–34.0)
MCHC: 33.5 g/dL (ref 30.0–36.0)
MCV: 87.4 fL (ref 80.0–100.0)
Platelets: 267 10*3/uL (ref 150–400)
RBC: 4.92 MIL/uL (ref 3.87–5.11)
RDW: 14.3 % (ref 11.5–15.5)
WBC: 7.7 10*3/uL (ref 4.0–10.5)
nRBC: 0 % (ref 0.0–0.2)

## 2018-08-05 LAB — BASIC METABOLIC PANEL
ANION GAP: 11 (ref 5–15)
BUN: 10 mg/dL (ref 6–20)
CO2: 24 mmol/L (ref 22–32)
Calcium: 9.2 mg/dL (ref 8.9–10.3)
Chloride: 102 mmol/L (ref 98–111)
Creatinine, Ser: 1.45 mg/dL — ABNORMAL HIGH (ref 0.44–1.00)
GFR, EST AFRICAN AMERICAN: 46 mL/min — AB (ref >60)
GFR, EST NON AFRICAN AMERICAN: 40 mL/min — AB (ref >60)
Glucose, Bld: 141 mg/dL — ABNORMAL HIGH (ref 70–99)
Potassium: 3.6 mmol/L (ref 3.5–5.1)
Sodium: 137 mmol/L (ref 135–145)

## 2018-08-05 MED ORDER — BENZONATATE 100 MG PO CAPS: 100.0000 mg | ORAL_CAPSULE | Freq: Three times a day (TID) | ORAL | 0 refills | Status: DC

## 2018-08-05 MED ORDER — ALBUTEROL SULFATE HFA 108 (90 BASE) MCG/ACT IN AERS
1.0000 | INHALATION_SPRAY | Freq: Four times a day (QID) | RESPIRATORY_TRACT | Status: DC | PRN
  Administered 2018-08-05: 2 via RESPIRATORY_TRACT
  Filled 2018-08-05: qty 6.7

## 2018-08-05 MED ORDER — PREDNISONE 20 MG PO TABS
60.0000 mg | ORAL_TABLET | Freq: Once | ORAL | Status: AC
  Administered 2018-08-05: 60 mg via ORAL
  Filled 2018-08-05: qty 3

## 2018-08-05 MED ORDER — PREDNISONE 50 MG PO TABS: 50.0000 mg | ORAL_TABLET | Freq: Every day | ORAL | 0 refills | Status: DC

## 2018-08-05 MED ORDER — SODIUM CHLORIDE 0.9 % IV BOLUS
1000.0000 mL | Freq: Once | INTRAVENOUS | Status: AC
  Administered 2018-08-05: 1000 mL via INTRAVENOUS

## 2018-08-05 MED ORDER — IPRATROPIUM-ALBUTEROL 0.5-2.5 (3) MG/3ML IN SOLN
3.0000 mL | Freq: Once | RESPIRATORY_TRACT | Status: AC
  Administered 2018-08-05: 3 mL via RESPIRATORY_TRACT
  Filled 2018-08-05: qty 3

## 2018-08-05 NOTE — Discharge Instructions (Addendum)
The inhaler to help with the cough and the shortness of breath.  Take the steroids until they are finished.  The Tessalon can be used to help with your cough symptoms.  You may also take an over-the-counter medication such as Robitussin for the mucus production.  Follow-up with your doctor as planned.  Return as needed for worsening symptoms

## 2018-08-05 NOTE — ED Notes (Signed)
Attempted IV start x2, unsucessfull. IV team made aware

## 2018-08-05 NOTE — ED Provider Notes (Signed)
[facility] EMERGENCY DEPARTMENT Provider Note   CSN: [id number] Arrival date & time: 08/05/18  1533     History   Chief Complaint Chief Complaint  Patient presents with  . Shortness of Breath    HPI Jocelyn [name] is a 58 y.o. female.  HPI Pt started having sx on Thursday.  She has uri sx and cough.  She also had body aches. The sx gradually got worse over the next few days.  She has been coughing.  Today she started to feel chilled and short of breath.  She has been in her back when she breathes and coughs.   Pt does smokes.  She uses inhalers occasional but not with this illness.    Past Medical History:  Diagnosis Date  . Anxiety   . Arthritis   . Chronic diastolic CHF (congestive heart failure), NYHA class 2 ([place])   . Chronic kidney disease    kidney stones hx  . COPD (chronic obstructive pulmonary disease) ([place])   . Depression   . HTN (hypertension)   . Hypercholesterolemia   . Hypothyroidism (acquired)   . Myocardial infarction [facility])    x3- pt unsure if MIs were in 1996 or 1999, had stent fixed in 2016 but no MI at that time per pt  . Thyrotoxicosis   . TIA (transient ischemic attack)    2013    Patient Active Problem List   Diagnosis Date Noted  . Thyrotoxicosis 12/11/2017  . Sacroiliitis, not elsewhere classified ([place]) 04/24/2017  . Chronic right-sided low back pain with right-sided sciatica 01/12/2017  . Chronic bilateral thoracic back pain 01/12/2017  . Other spondylosis with radiculopathy, lumbar region 10/19/2016  . Depression with anxiety 07/16/2016  . Chronic diastolic CHF (congestive heart failure) ([place]) 07/16/2016  . Aortic atherosclerosis ([place]) 11/24/2015  . Unstable angina ([place]) 08/21/2015  . Chest pain   . Coronary artery disease   . CAD (coronary artery disease), native coronary artery 01/14/2015  . Hypertension 01/14/2015  . Hypercholesterolemia 01/14/2015  . Tobacco abuse 01/14/2015    Past Surgical History:    Procedure Laterality Date  . BREAST CYST EXCISION    . CARDIAC CATHETERIZATION N/A 08/24/2015   Procedure: Left Heart Cath and Coronary Angiography;  Surgeon: Jettie Booze, MD;  Location: [facility];  Service: Cardiovascular;  Laterality: N/A;  . COLONOSCOPY    . [place]   and then had procedure to repair in 2016  . KNEE SURGERY    . partial hysterec    . THYROIDECTOMY       OB History   No obstetric history on file.      Home Medications    Prior to Admission medications   Medication Sig Start Date End Date Taking? Authorizing Provider  albuterol (PROVENTIL HFA;VENTOLIN HFA) 108 (90 Base) MCG/ACT inhaler Inhale 2 puffs into the lungs every 4 (four) hours as needed for shortness of breath.  06/21/15 12/22/16  [provider]  ALPRAZolam [name]) 1 MG tablet TAKE 1 TABLET BY MOUTH BID 06/03/16   [provider]  aspirin 325 MG tablet Take 325 mg by mouth daily.    [provider]  benzonatate (TESSALON) 100 MG capsule Take 1 capsule (100 mg total) by mouth every 8 (eight) hours. 08/05/18   Dorie Rank, MD  cyclobenzaprine (FLEXERIL) 10 MG tablet Take 1 tablet (10 mg total) by mouth 3 (three) times daily as needed for muscle spasms. 03/29/17   Coralyn Pear,  Lodema Hong, NP  estradiol (ESTRACE) 0.5 MG tablet Take 0.5 mg by mouth daily. 08/15/17   [provider]  furosemide (LASIX) 40 MG tablet Take 40 mg by mouth daily.     [provider]  isosorbide mononitrate (IMDUR) 30 MG 24 hr tablet Take 2 tablets (60 mg total) by mouth daily. 07/17/16   [name], MD  metoprolol tartrate (LOPRESSOR) 25 MG tablet Take 1 tablet (25 mg total) by mouth 2 (two) times daily. 07/17/16   [name], MD  nitroGLYCERIN (NITROSTAT) 0.3 MG SL tablet TAKE 1 TABLET UNDER THE TONGUE EVERY 5 MINUTES AS NEEDED FOR CHEST PAIN 10/28/15   [provider]  predniSONE (DELTASONE) 50 MG tablet Take 1 tablet (50 mg total) by mouth daily.  08/05/18   Dorie Rank, MD  simvastatin (ZOCOR) 20 MG tablet Take 20 mg by mouth at bedtime. 01/03/15   [provider]  spironolactone (ALDACTONE) 25 MG tablet Take 25 mg by mouth 2 (two) times daily. 01/03/15   [provider]  triamcinolone cream (KENALOG) 0.5 % Apply 1 application topically daily as needed (for sun exposure).    [provider]    Family History Family History  Problem Relation Age of Onset  . Heart disease Mother   . Stroke Mother   . Hypertension Mother   . Heart disease Father   . Cancer Father   . Graves' disease Father   . Colon cancer Father        pt unsure of age onset, believes he was in his 38s  . Stroke Brother   . Hypertension Maternal Grandmother   . Heart attack Neg Hx   . Esophageal cancer Neg Hx   . Stomach cancer Neg Hx   . Rectal cancer Neg Hx     Social History Social History   Tobacco Use  . Smoking status: Current Every Day Smoker    Packs/day: 0.50    Types: Cigarettes  . Smokeless tobacco: Never Used  Substance Use Topics  . Alcohol use: No    Alcohol/week: 0.0 standard drinks  . Drug use: No     Allergies   Hydrocodone   Review of Systems Review of Systems  Constitutional: Negative for fever.       Felt warm  Respiratory: Positive for cough.   Gastrointestinal: Negative for diarrhea and vomiting.  Genitourinary: Negative for dysuria.  All other systems reviewed and are negative.    Physical Exam Updated Vital Signs BP 95/68 (BP Location: Right Arm)   Pulse 72   Temp 98 F (36.7 C) (Oral)   Resp 18   Ht 1.626 m (5\' 4" )   Wt 95.3 kg   SpO2 98%   BMI 36.05 kg/m   Physical Exam Vitals signs and nursing note reviewed.  Constitutional:      General: She is not in acute distress.    Appearance: She is well-developed.  HENT:     Head: Normocephalic and atraumatic.     Right Ear: External ear normal.     Left Ear: External ear normal.  Eyes:     General: No scleral icterus.        Right eye: No discharge.        Left eye: No discharge.     Conjunctiva/sclera: Conjunctivae normal.  Neck:     Musculoskeletal: Neck supple.     Trachea: No tracheal deviation.  Cardiovascular:     Rate and Rhythm: Normal rate and regular rhythm.  Pulmonary:     Effort: Pulmonary effort is normal. No respiratory distress.     Breath sounds: No stridor. Wheezing present. No rales.  Abdominal:     General: Bowel sounds are normal. There is no distension.     Palpations: Abdomen is soft.     Tenderness: There is no abdominal tenderness. There is no guarding or rebound.  Musculoskeletal:        General: No tenderness.  Skin:    General: Skin is warm and dry.     Findings: No rash.  Neurological:     Mental Status: She is alert.     Cranial Nerves: No cranial nerve deficit (no facial droop, extraocular movements intact, no slurred speech).     Sensory: No sensory deficit.     Motor: No abnormal muscle tone or seizure activity.     Coordination: Coordination normal.      ED Treatments / Results  Labs (all labs ordered are listed, but only abnormal results are displayed) Labs Reviewed  BASIC METABOLIC PANEL - Abnormal; Notable for the following components:      Result Value   Glucose, Bld 141 (*)    Creatinine, Ser 1.45 (*)    GFR calc non Af Amer 40 (*)    GFR calc Af Amer 46 (*)    All other components within normal limits  CBC    EKG None  Radiology Dg Chest 2 View  Result Date: 08/05/2018 CLINICAL DATA:  Flu-like symptoms, productive cough and congestion. Back pain. History of CHF and COPD. EXAM: CHEST - 2 VIEW COMPARISON:  Chest radiograph November 04, 2017 FINDINGS: Cardiomediastinal silhouette is normal. No pleural effusions or focal consolidations. Increased LEFT lung base strandy densities. Trachea projects midline and there is no pneumothorax. Soft tissue planes and included osseous structures are non-suspicious. IMPRESSION: LEFT lung base atelectasis. Electronically  Signed   By: Elon Alas M.D.   On: 08/05/2018 16:17    Procedures Procedures (including critical care time)  Medications Ordered in ED Medications  sodium chloride 0.9 % bolus 1,000 mL (has no administration in time range)  albuterol (PROVENTIL HFA;VENTOLIN HFA) 108 (90 Base) MCG/ACT inhaler 1-2 puff (has no administration in time range)  ipratropium-albuterol (DUONEB) 0.5-2.5 (3) MG/3ML nebulizer solution 3 mL (3 mLs Nebulization Given 08/05/18 1744)  predniSONE (DELTASONE) tablet 60 mg (60 mg Oral Given 08/05/18 1727)     Initial Impression / Assessment and Plan / ED Course  I have reviewed the triage vital signs and the nursing notes.  Pertinent labs & imaging results that were available during my care of the patient were reviewed by me and considered in my medical decision making (see chart for details).  Clinical Course as of Aug 06 1835  Nancy Fetter Aug 05, 2018  1836 Sx improved after breathing treatment.  Breathing easily on repeat exam.   [JK]    Clinical Course User Index [JK] Dorie Rank, MD  Patient presented to the emergency room for evaluation of cough, myalgias and symptoms consistent with a flulike illness.  Patient laboratory tests were notable for mild acute kidney injury.  x-ray did not show pneumonia.  Patient was given IV fluids.  She was also given a dose of prednisone and albuterol.  Patient felt significantly better.  She is outside the window for Tamiflu.  Plan on discharge home with prescription for prednisone.  Also give the patient an inhaler and a prescription for Tessalon.  She has a follow-up appointment with her doctor tomorrow.  Final Clinical Impressions(s) / ED Diagnoses   Final diagnoses:  Bronchitis with bronchospasm  Influenza-like illness    ED Discharge Orders         Ordered    predniSONE (DELTASONE) 50 MG tablet  Daily     08/05/18 1834    benzonatate (TESSALON) 100 MG capsule  Every 8 hours     08/05/18 1834           Dorie Rank,  MD 08/05/18 [phone]

## 2018-08-05 NOTE — ED Triage Notes (Signed)
Pt arrives POV for eval of flu-like sx since Thursday. Pt reports productive cough, shortness of breath and congestion. States she has PCP appt in the AM, but started w/ L upper back pain when breathing this AM prompting her to present today. Pt started w/ increased sputum this afternoon.

## 2018-08-13 ENCOUNTER — Encounter: Payer: Self-pay | Admitting: Endocrinology

## 2018-08-13 ENCOUNTER — Ambulatory Visit (INDEPENDENT_AMBULATORY_CARE_PROVIDER_SITE_OTHER): Payer: Medicare Other | Admitting: Endocrinology

## 2018-08-13 VITALS — BP 138/78 | HR 79 | Ht 64.0 in | Wt 217.0 lb

## 2018-08-13 DIAGNOSIS — Z0001 Encounter for general adult medical examination with abnormal findings: Secondary | ICD-10-CM | POA: Diagnosis not present

## 2018-08-13 DIAGNOSIS — E0590 Thyrotoxicosis, unspecified without thyrotoxic crisis or storm: Secondary | ICD-10-CM

## 2018-08-13 DIAGNOSIS — R05 Cough: Secondary | ICD-10-CM | POA: Diagnosis not present

## 2018-08-13 DIAGNOSIS — Z0101 Encounter for examination of eyes and vision with abnormal findings: Secondary | ICD-10-CM | POA: Diagnosis not present

## 2018-08-13 DIAGNOSIS — Z0110 Encounter for examination of ears and hearing without abnormal findings: Secondary | ICD-10-CM | POA: Diagnosis not present

## 2018-08-13 DIAGNOSIS — I10 Essential (primary) hypertension: Secondary | ICD-10-CM | POA: Diagnosis not present

## 2018-08-13 DIAGNOSIS — Z131 Encounter for screening for diabetes mellitus: Secondary | ICD-10-CM | POA: Diagnosis not present

## 2018-08-13 LAB — TSH: TSH: 58.36 u[IU]/mL — ABNORMAL HIGH (ref 0.35–4.50)

## 2018-08-13 LAB — T4, FREE: Free T4: 0.11 ng/dL — ABNORMAL LOW (ref 0.60–1.60)

## 2018-08-13 MED ORDER — LEVOTHYROXINE SODIUM 100 MCG PO TABS: 100.0000 ug | ORAL_TABLET | Freq: Every day | ORAL | 3 refills | Status: DC

## 2018-08-13 NOTE — Progress Notes (Signed)
Subjective:    Patient ID: Jocelyn [name], female    DOB: May 24, 1961, 58 y.o.   MRN: [id number]  HPI Pt returns for f/u of hyperthyroidism (she had partial thyroidectomy in 1989; she then intermittently took synthroid intermittently; she has not recently had had thyroid imaging; synthroid was stopped in early 2019, due to suppressed TSH; she had RAI 11/19).  pt states she feels well in general, except for anxiety, which is unchanged.    Past Medical History:  Diagnosis Date  . Anxiety   . Arthritis   . Chronic diastolic CHF (congestive heart failure), NYHA class 2 ([place])   . Chronic kidney disease    kidney stones hx  . COPD (chronic obstructive pulmonary disease) ([place])   . Depression   . HTN (hypertension)   . Hypercholesterolemia   . Hypothyroidism (acquired)   . Myocardial infarction [facility])    x3- pt unsure if MIs were in 1996 or 1999, had stent fixed in 2016 but no MI at that time per pt  . Thyrotoxicosis   . TIA (transient ischemic attack)    2013    Past Surgical History:  Procedure Laterality Date  . BREAST CYST EXCISION    . CARDIAC CATHETERIZATION N/A 08/24/2015   Procedure: Left Heart Cath and Coronary Angiography;  Surgeon: Jettie Booze, MD;  Location: [facility];  Service: Cardiovascular;  Laterality: N/A;  . COLONOSCOPY    . [place]   and then had procedure to repair in 2016  . KNEE SURGERY    . partial hysterec    . THYROIDECTOMY      Social History   Socioeconomic History  . Marital status: Legally Separated    Spouse name: Not on file  . Number of children: 2  . Years of education: Not on file  . Highest education level: Not on file  Occupational History  . Occupation: [employer]  Social Needs  . Financial resource strain: Not on file  . Food insecurity:    Worry: Not on file    Inability: Not on file  . Transportation needs:    Medical: Not on file    Non-medical: Not on file  Tobacco Use  . Smoking status:  Current Every Day Smoker    Packs/day: 0.50    Types: Cigarettes  . Smokeless tobacco: Never Used  Substance and Sexual Activity  . Alcohol use: No    Alcohol/week: 0.0 standard drinks  . Drug use: No  . Sexual activity: Not on file  Lifestyle  . Physical activity:    Days per week: Not on file    Minutes per session: Not on file  . Stress: Not on file  Relationships  . Social connections:    Talks on phone: Not on file    Gets together: Not on file    Attends religious service: Not on file    Active member of club or organization: Not on file    Attends meetings of clubs or organizations: Not on file    Relationship status: Not on file  . Intimate partner violence:    Fear of current or ex partner: Not on file    Emotionally abused: Not on file    Physically abused: Not on file    Forced sexual activity: Not on file  Other Topics Concern  . Not on file  Social History Narrative  . Not on file    Current Outpatient Medications on File Prior to  Visit  Medication Sig Dispense Refill  . ALPRAZolam (XANAX) 1 MG tablet TAKE 1 TABLET BY MOUTH BID  1  . aspirin 325 MG tablet Take 325 mg by mouth daily.    . cyclobenzaprine (FLEXERIL) 10 MG tablet Take 1 tablet (10 mg total) by mouth 3 (three) times daily as needed for muscle spasms. 21 tablet 0  . estradiol (ESTRACE) 0.5 MG tablet Take 0.5 mg by mouth daily.  0  . furosemide (LASIX) 40 MG tablet Take 40 mg by mouth daily.     . isosorbide mononitrate (IMDUR) 30 MG 24 hr tablet Take 2 tablets (60 mg total) by mouth daily. 30 tablet 5  . metoprolol tartrate (LOPRESSOR) 25 MG tablet Take 1 tablet (25 mg total) by mouth 2 (two) times daily. 30 tablet 2  . nitroGLYCERIN (NITROSTAT) 0.3 MG SL tablet TAKE 1 TABLET UNDER THE TONGUE EVERY 5 MINUTES AS NEEDED FOR CHEST PAIN  0  . simvastatin (ZOCOR) 20 MG tablet Take 20 mg by mouth at bedtime.  1  . spironolactone (ALDACTONE) 25 MG tablet Take 25 mg by mouth 2 (two) times daily.  1  .  triamcinolone cream (KENALOG) 0.5 % Apply 1 application topically daily as needed (for sun exposure).    Marland Kitchen albuterol (PROVENTIL HFA;VENTOLIN HFA) 108 (90 Base) MCG/ACT inhaler Inhale 2 puffs into the lungs every 4 (four) hours as needed for shortness of breath.     . benzonatate (TESSALON) 100 MG capsule Take 1 capsule (100 mg total) by mouth every 8 (eight) hours. (Patient not taking: Reported on 08/13/2018) 21 capsule 0  . predniSONE (DELTASONE) 50 MG tablet Take 1 tablet (50 mg total) by mouth daily. (Patient not taking: Reported on 08/13/2018) 5 tablet 0   No current facility-administered medications on file prior to visit.     Allergies  Allergen Reactions  . Hydrocodone Nausea Only    Has problems with high doses    Family History  Problem Relation Age of Onset  . Heart disease Mother   . Stroke Mother   . Hypertension Mother   . Heart disease Father   . Cancer Father   . Graves' disease Father   . Colon cancer Father        pt unsure of age onset, believes he was in his 61s  . Stroke Brother   . Hypertension Maternal Grandmother   . Heart attack Neg Hx   . Esophageal cancer Neg Hx   . Stomach cancer Neg Hx   . Rectal cancer Neg Hx     BP 138/78 (BP Location: Left Arm, Patient Position: Sitting, Cuff Size: Normal)   Pulse 79   Ht 5\' 4"  (1.626 m)   Wt 217 lb (98.4 kg)   SpO2 98%   BMI 37.25 kg/m    Review of Systems She has gained weight.      Objective:   Physical Exam VITAL SIGNS:  See vs page GENERAL: no distress Neck: a healed scar is present.  I do not appreciate a nodule in the thyroid or elsewhere in the neck.    Lab Results  Component Value Date   TSH 58.36 (H) 08/13/2018      Assessment & Plan:  Post-RAI hypothyroidism, new.  I rx'ed synthroid.   Patient Instructions  Blood tests are requested for you today.  We'll let you know about the results.  Please come back for a follow-up appointment in 1 month.

## 2018-08-13 NOTE — Patient Instructions (Signed)
Blood tests are requested for you today.  We'll let you know about the results.  Please come back for a follow-up appointment in 1 month.  

## 2018-08-27 DIAGNOSIS — H524 Presbyopia: Secondary | ICD-10-CM | POA: Diagnosis not present

## 2018-09-17 ENCOUNTER — Ambulatory Visit: Payer: Medicare Other | Admitting: Endocrinology

## 2018-09-18 DIAGNOSIS — Z0001 Encounter for general adult medical examination with abnormal findings: Secondary | ICD-10-CM | POA: Diagnosis not present

## 2018-09-18 DIAGNOSIS — E1165 Type 2 diabetes mellitus with hyperglycemia: Secondary | ICD-10-CM | POA: Diagnosis not present

## 2018-09-18 DIAGNOSIS — E039 Hypothyroidism, unspecified: Secondary | ICD-10-CM | POA: Diagnosis not present

## 2018-09-18 DIAGNOSIS — I5032 Chronic diastolic (congestive) heart failure: Secondary | ICD-10-CM | POA: Diagnosis not present

## 2018-09-18 DIAGNOSIS — I10 Essential (primary) hypertension: Secondary | ICD-10-CM | POA: Diagnosis not present

## 2018-09-26 ENCOUNTER — Ambulatory Visit (INDEPENDENT_AMBULATORY_CARE_PROVIDER_SITE_OTHER): Payer: Medicare Other | Admitting: Endocrinology

## 2018-09-26 ENCOUNTER — Other Ambulatory Visit: Payer: Self-pay

## 2018-09-26 ENCOUNTER — Encounter: Payer: Self-pay | Admitting: Endocrinology

## 2018-09-26 DIAGNOSIS — E890 Postprocedural hypothyroidism: Secondary | ICD-10-CM | POA: Diagnosis not present

## 2018-09-26 DIAGNOSIS — E039 Hypothyroidism, unspecified: Secondary | ICD-10-CM | POA: Insufficient documentation

## 2018-09-26 NOTE — Progress Notes (Signed)
Subjective:    Patient ID: Jocelyn [name], female    DOB: 1961-01-03, 58 y.o.   MRN: [id number]  HPI Pt returns for f/u of post-RAI hypothyroidism (she had partial thyroidectomy in 1989; she then intermittently took synthroid intermittently; she has not recently had had thyroid imaging; synthroid was stopped in early 2019, due to suppressed TSH; she had RAI 11/19; she stated synthroid 1/20).  Since on the synthroid, pt states she feels well in general.  Past Medical History:  Diagnosis Date  . Anxiety   . Arthritis   . Chronic diastolic CHF (congestive heart failure), NYHA class 2 ([place])   . Chronic kidney disease    kidney stones hx  . COPD (chronic obstructive pulmonary disease) ([place])   . Depression   . HTN (hypertension)   . Hypercholesterolemia   . Hypothyroidism (acquired)   . Myocardial infarction [facility])    x3- pt unsure if MIs were in 1996 or 1999, had stent fixed in 2016 but no MI at that time per pt  . Thyrotoxicosis   . TIA (transient ischemic attack)    2013    Past Surgical History:  Procedure Laterality Date  . BREAST CYST EXCISION    . CARDIAC CATHETERIZATION N/A 08/24/2015   Procedure: Left Heart Cath and Coronary Angiography;  Surgeon: Jettie Booze, MD;  Location: [facility];  Service: Cardiovascular;  Laterality: N/A;  . COLONOSCOPY    . [place]   and then had procedure to repair in 2016  . KNEE SURGERY    . partial hysterec    . THYROIDECTOMY      Social History   Socioeconomic History  . Marital status: Legally Separated    Spouse name: Not on file  . Number of children: 2  . Years of education: Not on file  . Highest education level: Not on file  Occupational History  . Occupation: [employer]  Social Needs  . Financial resource strain: Not on file  . Food insecurity:    Worry: Not on file    Inability: Not on file  . Transportation needs:    Medical: Not on file    Non-medical: Not on file  Tobacco Use  .  Smoking status: Current Every Day Smoker    Packs/day: 0.50    Types: Cigarettes  . Smokeless tobacco: Never Used  Substance and Sexual Activity  . Alcohol use: No    Alcohol/week: 0.0 standard drinks  . Drug use: No  . Sexual activity: Not on file  Lifestyle  . Physical activity:    Days per week: Not on file    Minutes per session: Not on file  . Stress: Not on file  Relationships  . Social connections:    Talks on phone: Not on file    Gets together: Not on file    Attends religious service: Not on file    Active member of club or organization: Not on file    Attends meetings of clubs or organizations: Not on file    Relationship status: Not on file  . Intimate partner violence:    Fear of current or ex partner: Not on file    Emotionally abused: Not on file    Physically abused: Not on file    Forced sexual activity: Not on file  Other Topics Concern  . Not on file  Social History Narrative  . Not on file    Current Outpatient Medications on File Prior  to Visit  Medication Sig Dispense Refill  . ALPRAZolam (XANAX) 1 MG tablet TAKE 1 TABLET BY MOUTH BID  1  . aspirin 325 MG tablet Take 325 mg by mouth daily.    . benzonatate (TESSALON) 100 MG capsule Take 1 capsule (100 mg total) by mouth every 8 (eight) hours. 21 capsule 0  . cyclobenzaprine (FLEXERIL) 10 MG tablet Take 1 tablet (10 mg total) by mouth 3 (three) times daily as needed for muscle spasms. 21 tablet 0  . estradiol (ESTRACE) 0.5 MG tablet Take 0.5 mg by mouth daily.  0  . furosemide (LASIX) 40 MG tablet Take 40 mg by mouth daily.     . isosorbide mononitrate (IMDUR) 30 MG 24 hr tablet Take 2 tablets (60 mg total) by mouth daily. 30 tablet 5  . metoprolol tartrate (LOPRESSOR) 25 MG tablet Take 1 tablet (25 mg total) by mouth 2 (two) times daily. 30 tablet 2  . nitroGLYCERIN (NITROSTAT) 0.3 MG SL tablet TAKE 1 TABLET UNDER THE TONGUE EVERY 5 MINUTES AS NEEDED FOR CHEST PAIN  0  . predniSONE (DELTASONE) 50 MG  tablet Take 1 tablet (50 mg total) by mouth daily. 5 tablet 0  . simvastatin (ZOCOR) 20 MG tablet Take 20 mg by mouth at bedtime.  1  . spironolactone (ALDACTONE) 25 MG tablet Take 25 mg by mouth 2 (two) times daily.  1  . triamcinolone cream (KENALOG) 0.5 % Apply 1 application topically daily as needed (for sun exposure).    Marland Kitchen albuterol (PROVENTIL HFA;VENTOLIN HFA) 108 (90 Base) MCG/ACT inhaler Inhale 2 puffs into the lungs every 4 (four) hours as needed for shortness of breath.      No current facility-administered medications on file prior to visit.     Allergies  Allergen Reactions  . Hydrocodone Nausea Only    Has problems with high doses    Family History  Problem Relation Age of Onset  . Heart disease Mother   . Stroke Mother   . Hypertension Mother   . Heart disease Father   . Cancer Father   . Graves' disease Father   . Colon cancer Father        pt unsure of age onset, believes he was in his 62s  . Stroke Brother   . Hypertension Maternal Grandmother   . Heart attack Neg Hx   . Esophageal cancer Neg Hx   . Stomach cancer Neg Hx   . Rectal cancer Neg Hx     BP 100/60 (BP Location: Right Arm, Patient Position: Sitting, Cuff Size: Large)   Pulse (!) 106   Ht 5\' 4"  (1.626 m)   Wt 217 lb (98.4 kg)   SpO2 93%   BMI 37.25 kg/m    Review of Systems Denies dry skin.      Objective:   Physical Exam VITAL SIGNS:  See vs page GENERAL: no distress eyes: no periorbital swelling, but there is slight bilat proptosis  Neck: a healed scar is present.  I do not appreciate a nodule in the thyroid or elsewhere in the neck.    Lab Results  Component Value Date   TSH 4.71 (H) 09/26/2018      Assessment & Plan:  Hypothyroidism: she needs increased rx.   Patient Instructions  Blood tests are requested for you today.  We'll let you know about the results.  Please come back for a follow-up appointment in 1 month.

## 2018-09-26 NOTE — Patient Instructions (Signed)
Blood tests are requested for you today.  We'll let you know about the results.  Please come back for a follow-up appointment in 1 month.  

## 2018-09-27 LAB — TSH: TSH: 4.71 u[IU]/mL — ABNORMAL HIGH (ref 0.35–4.50)

## 2018-09-27 LAB — T4, FREE: FREE T4: 0.88 ng/dL (ref 0.60–1.60)

## 2018-09-27 MED ORDER — LEVOTHYROXINE SODIUM 125 MCG PO TABS: 125.0000 ug | ORAL_TABLET | Freq: Every day | ORAL | 2 refills | Status: DC

## 2018-10-15 ENCOUNTER — Other Ambulatory Visit: Payer: Self-pay | Admitting: Endocrinology

## 2018-10-24 ENCOUNTER — Other Ambulatory Visit: Payer: Self-pay

## 2018-10-25 ENCOUNTER — Encounter: Payer: Self-pay | Admitting: Endocrinology

## 2018-10-25 ENCOUNTER — Ambulatory Visit (INDEPENDENT_AMBULATORY_CARE_PROVIDER_SITE_OTHER): Payer: Medicare Other | Admitting: Endocrinology

## 2018-10-25 ENCOUNTER — Other Ambulatory Visit: Payer: Self-pay

## 2018-10-25 VITALS — BP 122/80 | HR 96 | Temp 98.4°F | Resp 12 | Ht 64.0 in | Wt 217.0 lb

## 2018-10-25 DIAGNOSIS — E890 Postprocedural hypothyroidism: Secondary | ICD-10-CM

## 2018-10-25 LAB — TSH: TSH: 0.11 u[IU]/mL — AB (ref 0.35–4.50)

## 2018-10-25 LAB — T4, FREE: FREE T4: 1.32 ng/dL (ref 0.60–1.60)

## 2018-10-25 NOTE — Progress Notes (Signed)
Subjective:    Patient ID: Jocelyn [name], female    DOB: [birth year], 58 y.o.   MRN: [id number]  HPI Pt returns for f/u of post-RAI hypothyroidism (she had partial thyroidectomy in 1989; she then intermittently took synthroid intermittently; synthroid was stopped in early 2019, due to suppressed TSH; she had RAI 11/19; she started synthroid 1/20).   she feels well in general, except for dry skin.  Past Medical History:  Diagnosis Date  . Anxiety   . Arthritis   . Chronic diastolic CHF (congestive heart failure), NYHA class 2 ([place])   . Chronic kidney disease    kidney stones hx  . COPD (chronic obstructive pulmonary disease) ([place])   . Depression   . HTN (hypertension)   . Hypercholesterolemia   . Hypothyroidism (acquired)   . Myocardial infarction [facility])    x3- pt unsure if MIs were in 1996 or 1999, had stent fixed in 2016 but no MI at that time per pt  . Thyrotoxicosis   . TIA (transient ischemic attack)    2013    Past Surgical History:  Procedure Laterality Date  . BREAST CYST EXCISION    . CARDIAC CATHETERIZATION N/A 08/24/2015   Procedure: Left Heart Cath and Coronary Angiography;  Surgeon: Jettie Booze, MD;  Location: [facility];  Service: Cardiovascular;  Laterality: N/A;  . COLONOSCOPY    . [place]   and then had procedure to repair in 2016  . KNEE SURGERY    . partial hysterec    . THYROIDECTOMY      Social History   Socioeconomic History  . Marital status: Legally Separated    Spouse name: Not on file  . Number of children: 2  . Years of education: Not on file  . Highest education level: Not on file  Occupational History  . Occupation: [employer]  Social Needs  . Financial resource strain: Not on file  . Food insecurity:    Worry: Not on file    Inability: Not on file  . Transportation needs:    Medical: Not on file    Non-medical: Not on file  Tobacco Use  . Smoking status: Current Every Day Smoker    Packs/day: 0.50     Types: Cigarettes  . Smokeless tobacco: Never Used  Substance and Sexual Activity  . Alcohol use: No    Alcohol/week: 0.0 standard drinks  . Drug use: No  . Sexual activity: Not on file  Lifestyle  . Physical activity:    Days per week: Not on file    Minutes per session: Not on file  . Stress: Not on file  Relationships  . Social connections:    Talks on phone: Not on file    Gets together: Not on file    Attends religious service: Not on file    Active member of club or organization: Not on file    Attends meetings of clubs or organizations: Not on file    Relationship status: Not on file  . Intimate partner violence:    Fear of current or ex partner: Not on file    Emotionally abused: Not on file    Physically abused: Not on file    Forced sexual activity: Not on file  Other Topics Concern  . Not on file  Social History Narrative  . Not on file    Current Outpatient Medications on File Prior to Visit  Medication Sig Dispense Refill  .  ALPRAZolam (XANAX) 1 MG tablet TAKE 1 TABLET BY MOUTH BID  1  . aspirin 325 MG tablet Take 325 mg by mouth daily.    . benzonatate (TESSALON) 100 MG capsule Take 1 capsule (100 mg total) by mouth every 8 (eight) hours. 21 capsule 0  . cyclobenzaprine (FLEXERIL) 10 MG tablet Take 1 tablet (10 mg total) by mouth 3 (three) times daily as needed for muscle spasms. 21 tablet 0  . estradiol (ESTRACE) 0.5 MG tablet Take 0.5 mg by mouth daily.  0  . furosemide (LASIX) 40 MG tablet Take 40 mg by mouth daily.     . isosorbide mononitrate (IMDUR) 30 MG 24 hr tablet Take 2 tablets (60 mg total) by mouth daily. 30 tablet 5  . levothyroxine (SYNTHROID, LEVOTHROID) 125 MCG tablet TAKE 1 TABLET (125 MCG TOTAL) BY MOUTH DAILY BEFORE BREAKFAST. 90 tablet 0  . metoprolol tartrate (LOPRESSOR) 25 MG tablet Take 1 tablet (25 mg total) by mouth 2 (two) times daily. 30 tablet 2  . nitroGLYCERIN (NITROSTAT) 0.3 MG SL tablet TAKE 1 TABLET UNDER THE TONGUE EVERY 5  MINUTES AS NEEDED FOR CHEST PAIN  0  . predniSONE (DELTASONE) 50 MG tablet Take 1 tablet (50 mg total) by mouth daily. 5 tablet 0  . simvastatin (ZOCOR) 20 MG tablet Take 20 mg by mouth at bedtime.  1  . spironolactone (ALDACTONE) 25 MG tablet Take 25 mg by mouth 2 (two) times daily.  1  . triamcinolone cream (KENALOG) 0.5 % Apply 1 application topically daily as needed (for sun exposure).    Marland Kitchen albuterol (PROVENTIL HFA;VENTOLIN HFA) 108 (90 Base) MCG/ACT inhaler Inhale 2 puffs into the lungs every 4 (four) hours as needed for shortness of breath.      No current facility-administered medications on file prior to visit.     Allergies  Allergen Reactions  . Hydrocodone Nausea Only    Has problems with high doses    Family History  Problem Relation Age of Onset  . Heart disease Mother   . Stroke Mother   . Hypertension Mother   . Heart disease Father   . Cancer Father   . Graves' disease Father   . Colon cancer Father        pt unsure of age onset, believes he was in his 29s  . Stroke Brother   . Hypertension Maternal Grandmother   . Heart attack Neg Hx   . Esophageal cancer Neg Hx   . Stomach cancer Neg Hx   . Rectal cancer Neg Hx     BP 122/80   Pulse 96   Temp 98.4 F (36.9 C)   Resp 12   Ht 5\' 4"  (1.626 m)   Wt 217 lb (98.4 kg)   SpO2 95%   BMI 37.25 kg/m    Review of Systems Denies constipation.     Objective:   Physical Exam VITAL SIGNS:  See vs page GENERAL: no distress Neck: a healed scar is present.  I do not appreciate a nodule in the thyroid or elsewhere in the neck.   Lab Results  Component Value Date   TSH 0.11 (L) 10/25/2018      Assessment & Plan:  Post-RAI hypothyroidism: overreplaced.  However, because RAI may not have fully worked, we'll continue the same medication fpor now Please come back for a follow-up appointment in 2 months.

## 2018-10-25 NOTE — Patient Instructions (Addendum)
Thyroid blood tests are requested for you today.  We'll let you know about the results.   Please come back for a follow-up appointment in 2 months.   

## 2018-12-20 ENCOUNTER — Other Ambulatory Visit: Payer: Self-pay

## 2018-12-25 ENCOUNTER — Ambulatory Visit (INDEPENDENT_AMBULATORY_CARE_PROVIDER_SITE_OTHER): Payer: Medicare Other | Admitting: Endocrinology

## 2018-12-25 ENCOUNTER — Other Ambulatory Visit: Payer: Self-pay

## 2018-12-25 ENCOUNTER — Encounter: Payer: Self-pay | Admitting: Endocrinology

## 2018-12-25 VITALS — BP 96/68 | HR 104 | Temp 98.0°F | Wt 218.0 lb

## 2018-12-25 DIAGNOSIS — E890 Postprocedural hypothyroidism: Secondary | ICD-10-CM | POA: Diagnosis not present

## 2018-12-25 LAB — TSH: TSH: 0.22 u[IU]/mL — ABNORMAL LOW (ref 0.35–4.50)

## 2018-12-25 LAB — T4, FREE: Free T4: 1.22 ng/dL (ref 0.60–1.60)

## 2018-12-25 MED ORDER — LEVOTHYROXINE SODIUM 112 MCG PO TABS: 112.0000 ug | ORAL_TABLET | Freq: Every day | ORAL | 3 refills | Status: DC

## 2018-12-25 NOTE — Patient Instructions (Addendum)
Thyroid blood tests are requested for you today.  We'll let you know about the results.  Please come back for a follow-up appointment in 3 months.

## 2018-12-25 NOTE — Progress Notes (Signed)
Subjective:    Patient ID: Jocelyn [name], female    DOB: [birth year], 58 y.o.   MRN: [id number]  HPI Pt returns for f/u of post-RAI hypothyroidism (she had partial thyroidectomy in 1989; she then intermittently took synthroid intermittently; synthroid was stopped in early 2019, due to suppressed TSH; she had RAI 11/19; she started synthroid 1/20).   she reports weight gain and anxiety.   Past Medical History:  Diagnosis Date  . Anxiety   . Arthritis   . Chronic diastolic CHF (congestive heart failure), NYHA class 2 ([place])   . Chronic kidney disease    kidney stones hx  . COPD (chronic obstructive pulmonary disease) ([place])   . Depression   . HTN (hypertension)   . Hypercholesterolemia   . Hypothyroidism (acquired)   . Myocardial infarction [facility])    x3- pt unsure if MIs were in 1996 or 1999, had stent fixed in 2016 but no MI at that time per pt  . Thyrotoxicosis   . TIA (transient ischemic attack)    2013    Past Surgical History:  Procedure Laterality Date  . BREAST CYST EXCISION    . CARDIAC CATHETERIZATION N/A 08/24/2015   Procedure: Left Heart Cath and Coronary Angiography;  Surgeon: Jettie Booze, MD;  Location: [facility];  Service: Cardiovascular;  Laterality: N/A;  . COLONOSCOPY    . [place]   and then had procedure to repair in 2016  . KNEE SURGERY    . partial hysterec    . THYROIDECTOMY      Social History   Socioeconomic History  . Marital status: Legally Separated    Spouse name: Not on file  . Number of children: 2  . Years of education: Not on file  . Highest education level: Not on file  Occupational History  . Occupation: [employer]  Social Needs  . Financial resource strain: Not on file  . Food insecurity:    Worry: Not on file    Inability: Not on file  . Transportation needs:    Medical: Not on file    Non-medical: Not on file  Tobacco Use  . Smoking status: Current Every Day Smoker    Packs/day: 0.50   Types: Cigarettes  . Smokeless tobacco: Never Used  Substance and Sexual Activity  . Alcohol use: No    Alcohol/week: 0.0 standard drinks  . Drug use: No  . Sexual activity: Not on file  Lifestyle  . Physical activity:    Days per week: Not on file    Minutes per session: Not on file  . Stress: Not on file  Relationships  . Social connections:    Talks on phone: Not on file    Gets together: Not on file    Attends religious service: Not on file    Active member of club or organization: Not on file    Attends meetings of clubs or organizations: Not on file    Relationship status: Not on file  . Intimate partner violence:    Fear of current or ex partner: Not on file    Emotionally abused: Not on file    Physically abused: Not on file    Forced sexual activity: Not on file  Other Topics Concern  . Not on file  Social History Narrative  . Not on file    Current Outpatient Medications on File Prior to Visit  Medication Sig Dispense Refill  . aspirin 325 MG tablet  Take 325 mg by mouth daily.    . cyclobenzaprine (FLEXERIL) 10 MG tablet Take 1 tablet (10 mg total) by mouth 3 (three) times daily as needed for muscle spasms. 21 tablet 0  . estradiol (ESTRACE) 0.5 MG tablet Take 0.5 mg by mouth daily.  0  . furosemide (LASIX) 40 MG tablet Take 40 mg by mouth daily.     . isosorbide mononitrate (IMDUR) 30 MG 24 hr tablet Take 2 tablets (60 mg total) by mouth daily. 30 tablet 5  . nitroGLYCERIN (NITROSTAT) 0.3 MG SL tablet TAKE 1 TABLET UNDER THE TONGUE EVERY 5 MINUTES AS NEEDED FOR CHEST PAIN  0  . simvastatin (ZOCOR) 20 MG tablet Take 20 mg by mouth at bedtime.  1  . spironolactone (ALDACTONE) 25 MG tablet Take 25 mg by mouth 2 (two) times daily.  1  . triamcinolone cream (KENALOG) 0.5 % Apply 1 application topically daily as needed (for sun exposure).    Marland Kitchen UNABLE TO FIND Med Name: Garlic and Honey (homemade regimen) - two cloves a day with tsp of honey    . albuterol (PROVENTIL  HFA;VENTOLIN HFA) 108 (90 Base) MCG/ACT inhaler Inhale 2 puffs into the lungs every 4 (four) hours as needed for shortness of breath.      No current facility-administered medications on file prior to visit.     Allergies  Allergen Reactions  . Hydrocodone Nausea Only    Has problems with high doses    Family History  Problem Relation Age of Onset  . Heart disease Mother   . Stroke Mother   . Hypertension Mother   . Heart disease Father   . Cancer Father   . Graves' disease Father   . Colon cancer Father        pt unsure of age onset, believes he was in his 59s  . Stroke Brother   . Hypertension Maternal Grandmother   . Heart attack Neg Hx   . Esophageal cancer Neg Hx   . Stomach cancer Neg Hx   . Rectal cancer Neg Hx     BP 96/68 (BP Location: Right Arm, Patient Position: Sitting, Cuff Size: Large)   Pulse (!) 104   Temp 98 F (36.7 C) (Oral)   Wt 218 lb (98.9 kg)   SpO2 98%   BMI 37.42 kg/m   Review of Systems Denies dry skin.      Objective:   Physical Exam VITAL SIGNS:  See vs page GENERAL: no distress NECK: There is no palpable thyroid enlargement.  No thyroid nodule is palpable.  No palpable lymphadenopathy at the anterior neck.    Lab Results  Component Value Date   TSH 0.22 (L) 12/25/2018      Assessment & Plan:  Hypothyroidism: overreplaced.  I have sent a prescription to your pharmacy, to reduce synthroid.

## 2019-01-07 DIAGNOSIS — I10 Essential (primary) hypertension: Secondary | ICD-10-CM | POA: Diagnosis not present

## 2019-01-07 DIAGNOSIS — I5032 Chronic diastolic (congestive) heart failure: Secondary | ICD-10-CM | POA: Diagnosis not present

## 2019-01-07 DIAGNOSIS — E039 Hypothyroidism, unspecified: Secondary | ICD-10-CM | POA: Diagnosis not present

## 2019-01-07 DIAGNOSIS — E1165 Type 2 diabetes mellitus with hyperglycemia: Secondary | ICD-10-CM | POA: Diagnosis not present

## 2019-01-07 DIAGNOSIS — E782 Mixed hyperlipidemia: Secondary | ICD-10-CM | POA: Diagnosis not present

## 2019-01-29 DIAGNOSIS — E039 Hypothyroidism, unspecified: Secondary | ICD-10-CM | POA: Diagnosis not present

## 2019-01-29 DIAGNOSIS — E1165 Type 2 diabetes mellitus with hyperglycemia: Secondary | ICD-10-CM | POA: Diagnosis not present

## 2019-01-29 DIAGNOSIS — I5032 Chronic diastolic (congestive) heart failure: Secondary | ICD-10-CM | POA: Diagnosis not present

## 2019-01-29 DIAGNOSIS — I10 Essential (primary) hypertension: Secondary | ICD-10-CM | POA: Diagnosis not present

## 2019-01-29 DIAGNOSIS — E782 Mixed hyperlipidemia: Secondary | ICD-10-CM | POA: Diagnosis not present

## 2019-03-19 DIAGNOSIS — I5032 Chronic diastolic (congestive) heart failure: Secondary | ICD-10-CM | POA: Diagnosis not present

## 2019-03-19 DIAGNOSIS — E039 Hypothyroidism, unspecified: Secondary | ICD-10-CM | POA: Diagnosis not present

## 2019-03-19 DIAGNOSIS — I10 Essential (primary) hypertension: Secondary | ICD-10-CM | POA: Diagnosis not present

## 2019-03-19 DIAGNOSIS — E782 Mixed hyperlipidemia: Secondary | ICD-10-CM | POA: Diagnosis not present

## 2019-03-19 DIAGNOSIS — E1165 Type 2 diabetes mellitus with hyperglycemia: Secondary | ICD-10-CM | POA: Diagnosis not present

## 2019-03-25 ENCOUNTER — Other Ambulatory Visit: Payer: Self-pay

## 2019-03-27 ENCOUNTER — Ambulatory Visit (INDEPENDENT_AMBULATORY_CARE_PROVIDER_SITE_OTHER): Payer: Medicare Other | Admitting: Endocrinology

## 2019-03-27 ENCOUNTER — Encounter: Payer: Self-pay | Admitting: Endocrinology

## 2019-03-27 ENCOUNTER — Other Ambulatory Visit: Payer: Self-pay

## 2019-03-27 VITALS — BP 110/60 | HR 104 | Ht 64.0 in | Wt 220.4 lb

## 2019-03-27 DIAGNOSIS — E042 Nontoxic multinodular goiter: Secondary | ICD-10-CM | POA: Diagnosis not present

## 2019-03-27 DIAGNOSIS — E890 Postprocedural hypothyroidism: Secondary | ICD-10-CM | POA: Diagnosis not present

## 2019-03-27 LAB — T4, FREE: Free T4: 1.23 ng/dL (ref 0.60–1.60)

## 2019-03-27 LAB — TSH: TSH: 0.18 u[IU]/mL — ABNORMAL LOW (ref 0.35–4.50)

## 2019-03-27 MED ORDER — LEVOTHYROXINE SODIUM 88 MCG PO TABS: 88.0000 ug | ORAL_TABLET | Freq: Every day | ORAL | 5 refills | Status: DC

## 2019-03-27 NOTE — Patient Instructions (Addendum)
Blood tests are requested for you today.  We'll let you know about the results.  Let's recheck the ultrasound.  you will receive a phone call, about a day and time for an appointment.  Please come back for a follow-up appointment in 3 months.

## 2019-03-27 NOTE — Progress Notes (Signed)
Subjective:    Patient ID: Jocelyn [name], female    DOB: [birth year], 58 y.o.   MRN: [id number]  HPI Pt returns for f/u of post-RAI hypothyroidism (she had partial thyroidectomy in 1989; she then intermittently took synthroid intermittently; synthroid was stopped in early 2019, due to suppressed TSH; she had RAI 11/19; she started synthroid 1/20).   She says TFT were normal 1-2 weeks ago.  She reports slight liquid dysphagia in the neck , but no assoc pain.   Past Medical History:  Diagnosis Date  . Anxiety   . Arthritis   . Chronic diastolic CHF (congestive heart failure), NYHA class 2 ([place])   . Chronic kidney disease    kidney stones hx  . COPD (chronic obstructive pulmonary disease) ([place])   . Depression   . HTN (hypertension)   . Hypercholesterolemia   . Hypothyroidism (acquired)   . Myocardial infarction [facility])    x3- pt unsure if MIs were in 1996 or 1999, had stent fixed in 2016 but no MI at that time per pt  . Thyrotoxicosis   . TIA (transient ischemic attack)    2013    Past Surgical History:  Procedure Laterality Date  . BREAST CYST EXCISION    . CARDIAC CATHETERIZATION N/A 08/24/2015   Procedure: Left Heart Cath and Coronary Angiography;  Surgeon: Jettie Booze, MD;  Location: [facility];  Service: Cardiovascular;  Laterality: N/A;  . COLONOSCOPY    . [place]   and then had procedure to repair in 2016  . KNEE SURGERY    . partial hysterec    . THYROIDECTOMY      Social History   Socioeconomic History  . Marital status: Legally Separated    Spouse name: Not on file  . Number of children: 2  . Years of education: Not on file  . Highest education level: Not on file  Occupational History  . Occupation: [employer]  Social Needs  . Financial resource strain: Not on file  . Food insecurity    Worry: Not on file    Inability: Not on file  . Transportation needs    Medical: Not on file    Non-medical: Not on file  Tobacco Use  .  Smoking status: Current Every Day Smoker    Packs/day: 0.50    Types: Cigarettes  . Smokeless tobacco: Never Used  Substance and Sexual Activity  . Alcohol use: No    Alcohol/week: 0.0 standard drinks  . Drug use: No  . Sexual activity: Not on file  Lifestyle  . Physical activity    Days per week: Not on file    Minutes per session: Not on file  . Stress: Not on file  Relationships  . Social Herbalist on phone: Not on file    Gets together: Not on file    Attends religious service: Not on file    Active member of club or organization: Not on file    Attends meetings of clubs or organizations: Not on file    Relationship status: Not on file  . Intimate partner violence    Fear of current or ex partner: Not on file    Emotionally abused: Not on file    Physically abused: Not on file    Forced sexual activity: Not on file  Other Topics Concern  . Not on file  Social History Narrative  . Not on file    Current  Outpatient Medications on File Prior to Visit  Medication Sig Dispense Refill  . aspirin 325 MG tablet Take 325 mg by mouth daily.    . cyclobenzaprine (FLEXERIL) 10 MG tablet Take 1 tablet (10 mg total) by mouth 3 (three) times daily as needed for muscle spasms. 21 tablet 0  . estradiol (ESTRACE) 0.5 MG tablet Take 0.5 mg by mouth daily.  0  . furosemide (LASIX) 40 MG tablet Take 40 mg by mouth daily.     . isosorbide mononitrate (IMDUR) 30 MG 24 hr tablet Take 2 tablets (60 mg total) by mouth daily. 30 tablet 5  . nitroGLYCERIN (NITROSTAT) 0.3 MG SL tablet TAKE 1 TABLET UNDER THE TONGUE EVERY 5 MINUTES AS NEEDED FOR CHEST PAIN  0  . simvastatin (ZOCOR) 20 MG tablet Take 20 mg by mouth at bedtime.  1  . spironolactone (ALDACTONE) 25 MG tablet Take 25 mg by mouth 2 (two) times daily.  1  . triamcinolone cream (KENALOG) 0.5 % Apply 1 application topically daily as needed (for sun exposure).    Marland Kitchen UNABLE TO FIND Med Name: Garlic and Honey (homemade regimen) - two  cloves a day with tsp of honey    . albuterol (PROVENTIL HFA;VENTOLIN HFA) 108 (90 Base) MCG/ACT inhaler Inhale 2 puffs into the lungs every 4 (four) hours as needed for shortness of breath.      No current facility-administered medications on file prior to visit.     Allergies  Allergen Reactions  . Hydrocodone Nausea Only    Has problems with high doses    Family History  Problem Relation Age of Onset  . Heart disease Mother   . Stroke Mother   . Hypertension Mother   . Heart disease Father   . Cancer Father   . Graves' disease Father   . Colon cancer Father        pt unsure of age onset, believes he was in his 107s  . Stroke Brother   . Hypertension Maternal Grandmother   . Heart attack Neg Hx   . Esophageal cancer Neg Hx   . Stomach cancer Neg Hx   . Rectal cancer Neg Hx     BP 110/60 (BP Location: Left Arm, Patient Position: Sitting, Cuff Size: Large)   Pulse (!) 104   Ht 5\' 4"  (1.626 m)   Wt 220 lb 6.4 oz (100 kg)   SpO2 95%   BMI 37.83 kg/m    Review of Systems Denies neck swelling.  She has intermitt "hot flashes."    Objective:   Physical Exam VITAL SIGNS:  See vs page GENERAL: no distress Neck: a healed scar is present.  I do not appreciate a nodule in the thyroid or elsewhere in the neck.    Lab Results  Component Value Date   TSH 0.18 (L) 03/27/2019       Assessment & Plan:  Dysphagia: unlikely thyroid-related.   Hypothyroidism: overreplaced.  I have sent a prescription to your pharmacy, to reduce synthroid "hot flashes:" poss thyroid-related.  We'll follow on reduced synthroid MNG: due for recheck  Patient Instructions  Blood tests are requested for you today.  We'll let you know about the results.  Let's recheck the ultrasound.  you will receive a phone call, about a day and time for an appointment.  Please come back for a follow-up appointment in 3 months.

## 2019-04-11 ENCOUNTER — Emergency Department (HOSPITAL_BASED_OUTPATIENT_CLINIC_OR_DEPARTMENT_OTHER)
Admission: EM | Admit: 2019-04-11 | Discharge: 2019-04-11 | Disposition: A | Discharge status: Home / Self Care | Payer: Medicare Other | Attending: Emergency Medicine | Admitting: Emergency Medicine

## 2019-04-11 ENCOUNTER — Encounter (HOSPITAL_BASED_OUTPATIENT_CLINIC_OR_DEPARTMENT_OTHER): Payer: Self-pay

## 2019-04-11 ENCOUNTER — Emergency Department (HOSPITAL_BASED_OUTPATIENT_CLINIC_OR_DEPARTMENT_OTHER): Payer: Medicare Other

## 2019-04-11 ENCOUNTER — Other Ambulatory Visit: Payer: Self-pay

## 2019-04-11 DIAGNOSIS — I2510 Atherosclerotic heart disease of native coronary artery without angina pectoris: Secondary | ICD-10-CM | POA: Insufficient documentation

## 2019-04-11 DIAGNOSIS — I5032 Chronic diastolic (congestive) heart failure: Secondary | ICD-10-CM | POA: Diagnosis not present

## 2019-04-11 DIAGNOSIS — Z8673 Personal history of transient ischemic attack (TIA), and cerebral infarction without residual deficits: Secondary | ICD-10-CM | POA: Diagnosis not present

## 2019-04-11 DIAGNOSIS — K529 Noninfective gastroenteritis and colitis, unspecified: Secondary | ICD-10-CM

## 2019-04-11 DIAGNOSIS — Z7982 Long term (current) use of aspirin: Secondary | ICD-10-CM | POA: Diagnosis not present

## 2019-04-11 DIAGNOSIS — K625 Hemorrhage of anus and rectum: Secondary | ICD-10-CM

## 2019-04-11 DIAGNOSIS — I110 Hypertensive heart disease with heart failure: Secondary | ICD-10-CM | POA: Insufficient documentation

## 2019-04-11 DIAGNOSIS — F17210 Nicotine dependence, cigarettes, uncomplicated: Secondary | ICD-10-CM | POA: Insufficient documentation

## 2019-04-11 DIAGNOSIS — E039 Hypothyroidism, unspecified: Secondary | ICD-10-CM | POA: Diagnosis not present

## 2019-04-11 DIAGNOSIS — N200 Calculus of kidney: Secondary | ICD-10-CM | POA: Diagnosis not present

## 2019-04-11 DIAGNOSIS — Z79899 Other long term (current) drug therapy: Secondary | ICD-10-CM | POA: Insufficient documentation

## 2019-04-11 LAB — CBC WITH DIFFERENTIAL/PLATELET
Abs Immature Granulocytes: 0.01 10*3/uL (ref 0.00–0.07)
Basophils Absolute: 0 10*3/uL (ref 0.0–0.1)
Basophils Relative: 0 %
Eosinophils Absolute: 0 10*3/uL (ref 0.0–0.5)
Eosinophils Relative: 0 %
HCT: 42.3 % (ref 36.0–46.0)
Hemoglobin: 13.9 g/dL (ref 12.0–15.0)
Immature Granulocytes: 0 %
Lymphocytes Relative: 40 %
Lymphs Abs: 2.7 10*3/uL (ref 0.7–4.0)
MCH: 29.8 pg (ref 26.0–34.0)
MCHC: 32.9 g/dL (ref 30.0–36.0)
MCV: 90.6 fL (ref 80.0–100.0)
Monocytes Absolute: 0.6 10*3/uL (ref 0.1–1.0)
Monocytes Relative: 9 %
Neutro Abs: 3.5 10*3/uL (ref 1.7–7.7)
Neutrophils Relative %: 51 %
Platelets: 256 10*3/uL (ref 150–400)
RBC: 4.67 MIL/uL (ref 3.87–5.11)
RDW: 13.5 % (ref 11.5–15.5)
WBC: 6.8 10*3/uL (ref 4.0–10.5)
nRBC: 0 % (ref 0.0–0.2)

## 2019-04-11 LAB — COMPREHENSIVE METABOLIC PANEL
ALT: 20 U/L (ref 0–44)
AST: 21 U/L (ref 15–41)
Albumin: 3.8 g/dL (ref 3.5–5.0)
Alkaline Phosphatase: 49 U/L (ref 38–126)
Anion gap: 10 (ref 5–15)
BUN: 12 mg/dL (ref 6–20)
CO2: 26 mmol/L (ref 22–32)
Calcium: 9.6 mg/dL (ref 8.9–10.3)
Chloride: 102 mmol/L (ref 98–111)
Creatinine, Ser: 0.87 mg/dL (ref 0.44–1.00)
GFR calc Af Amer: >60 mL/min (ref >60)
GFR calc non Af Amer: >60 mL/min (ref >60)
Glucose, Bld: 118 mg/dL — ABNORMAL HIGH (ref 70–99)
Potassium: 3.6 mmol/L (ref 3.5–5.1)
Sodium: 138 mmol/L (ref 135–145)
Total Bilirubin: 0.5 mg/dL (ref 0.3–1.2)
Total Protein: 7.2 g/dL (ref 6.5–8.1)

## 2019-04-11 MED ORDER — SODIUM CHLORIDE 0.9 % IV BOLUS
1000.0000 mL | Freq: Once | INTRAVENOUS | Status: AC
  Administered 2019-04-11: 21:00:00 1000 mL via INTRAVENOUS

## 2019-04-11 MED ORDER — IOHEXOL 300 MG/ML  SOLN
100.0000 mL | Freq: Once | INTRAMUSCULAR | Status: AC | PRN
  Administered 2019-04-11: 21:00:00 100 mL via INTRAVENOUS

## 2019-04-11 MED ORDER — CIPROFLOXACIN HCL 500 MG PO TABS: 500.0000 mg | ORAL_TABLET | Freq: Two times a day (BID) | ORAL | 0 refills | Status: AC

## 2019-04-11 MED ORDER — METRONIDAZOLE 500 MG PO TABS: 500.0000 mg | ORAL_TABLET | Freq: Two times a day (BID) | ORAL | 0 refills | Status: AC

## 2019-04-11 NOTE — ED Triage Notes (Signed)
Pt c/o blood in stools today-denies as diarrhea stools-NAD-steady gait

## 2019-04-11 NOTE — ED Provider Notes (Signed)
[facility] EMERGENCY DEPARTMENT Provider Note   CSN: [id number] Arrival date & time: 04/11/19  1932     History   Chief Complaint Chief Complaint  Patient presents with   Rectal Bleeding    HPI Jocelyn [name] is a 58 y.o. female.     HPI  58 year old female with a history of MI, acquired hypothyroidism, hypertension, hyperlipidemia, COPD, chronic diastolic heart failure presents with concern for abdominal discomfort and rectal bleeding.  Patient reports that she was on the toilet for approximately 5 hours today.  Reports that the stool overall appeared normal, but that she had large amounts, and felt sensation of incomplete emptying and continued to sit on the toilet as she had bowel movements.  Reports she was straining at times.  She then noticed some bright red blood on tissue paper.  Reports that prior to having bowel movements she was having pain in the lower part of her abdomen, that felt like cramping, had associated urgency to use the bathroom.  She denies urinary symptoms.  No history of GI bleed, blood thinner use other than aspirin, or known hemorrhoids.  Reports some rectal pain as well as abdominal pain.  The stool was brown in color, however she had rectal bleeding.  Reports she then had another episode where she passed what looked like just small amount of blood or tissue without bowel movement.  No fevers.  Had nausea and one episode of emesis at home.  Has not had much food today, and had just water prior to her episode of vomiting.  Denies recent antibiotic use.  She does note that she ate some food from [facility] that she felt was suspicious.   Past Medical History:  Diagnosis Date   Anxiety    Arthritis    Chronic diastolic CHF (congestive heart failure), NYHA class 2 (HCC)    Chronic kidney disease    kidney stones hx   COPD (chronic obstructive pulmonary disease) (HCC)    Depression    HTN (hypertension)    Hypercholesterolemia     Hypothyroidism (acquired)    Myocardial infarction ([place])    x3- pt unsure if MIs were in 1996 or 1999, had stent fixed in 2016 but no MI at that time per pt   Thyrotoxicosis    TIA (transient ischemic attack)    2013    Patient Active Problem List   Diagnosis Date Noted   Multinodular goiter 03/27/2019   Hypothyroidism 09/26/2018   Sacroiliitis, not elsewhere classified ([place]) 04/24/2017   Chronic right-sided low back pain with right-sided sciatica 01/12/2017   Chronic bilateral thoracic back pain 01/12/2017   Other spondylosis with radiculopathy, lumbar region 10/19/2016   Depression with anxiety 07/16/2016   Chronic diastolic CHF (congestive heart failure) ([place]) 07/16/2016   Aortic atherosclerosis ([place]) 11/24/2015   Unstable angina ([place]) 08/21/2015   Chest pain    Coronary artery disease    CAD (coronary artery disease), native coronary artery 01/14/2015   Hypertension 01/14/2015   Hypercholesterolemia 01/14/2015   Tobacco abuse 01/14/2015    Past Surgical History:  Procedure Laterality Date   BREAST CYST EXCISION     CARDIAC CATHETERIZATION N/A 08/24/2015   Procedure: Left Heart Cath and Coronary Angiography;  Surgeon: Jettie Booze, MD;  Location: [facility];  Service: Cardiovascular;  Laterality: N/A;   COLONOSCOPY     CORONARY STENT PLACEMENT  1999   and then had procedure to repair in 2016   [place]  partial hysterec     THYROIDECTOMY       OB History   No obstetric history on file.      Home Medications    Prior to Admission medications   Medication Sig Start Date End Date Taking? Authorizing Provider  albuterol (PROVENTIL HFA;VENTOLIN HFA) 108 (90 Base) MCG/ACT inhaler Inhale 2 puffs into the lungs every 4 (four) hours as needed for shortness of breath.  06/21/15 12/22/16  [provider]  aspirin 325 MG tablet Take 325 mg by mouth daily.    [provider]  ciprofloxacin (CIPRO) 500 MG tablet  Take 1 tablet (500 mg total) by mouth every 12 (twelve) hours for 7 days. 04/11/19 04/18/19  Gareth Morgan, MD  cyclobenzaprine (FLEXERIL) 10 MG tablet Take 1 tablet (10 mg total) by mouth 3 (three) times daily as needed for muscle spasms. 03/29/17   [name], NP  estradiol (ESTRACE) 0.5 MG tablet Take 0.5 mg by mouth daily. 08/15/17   [provider]  furosemide (LASIX) 40 MG tablet Take 40 mg by mouth daily.     [provider]  isosorbide mononitrate (IMDUR) 30 MG 24 hr tablet Take 2 tablets (60 mg total) by mouth daily. 07/17/16   [name], MD  levothyroxine (SYNTHROID) 88 MCG tablet Take 1 tablet (88 mcg total) by mouth daily before breakfast. 03/27/19   [name], MD  metroNIDAZOLE (FLAGYL) 500 MG tablet Take 1 tablet (500 mg total) by mouth 2 (two) times daily for 7 days. 04/11/19 04/18/19  Gareth Morgan, MD  nitroGLYCERIN (NITROSTAT) 0.3 MG SL tablet TAKE 1 TABLET UNDER THE TONGUE EVERY 5 MINUTES AS NEEDED FOR CHEST PAIN 10/28/15   [provider]  simvastatin (ZOCOR) 20 MG tablet Take 20 mg by mouth at bedtime. 01/03/15   [provider]  spironolactone (ALDACTONE) 25 MG tablet Take 25 mg by mouth 2 (two) times daily. 01/03/15   [provider]  triamcinolone cream (KENALOG) 0.5 % Apply 1 application topically daily as needed (for sun exposure).    [provider]  UNABLE TO FIND Med Name: Garlic and Honey (homemade regimen) - two cloves a day with tsp of honey    [provider]    Family History Family History  Problem Relation Age of Onset   Heart disease Mother    Stroke Mother    Hypertension Mother    Heart disease Father    Cancer Father    Berenice Primas' disease Father    Colon cancer Father        pt unsure of age onset, believes he was in his 21s   Stroke Brother    Hypertension Maternal Grandmother    Heart attack Neg Hx    Esophageal cancer Neg Hx    Stomach cancer Neg Hx    Rectal cancer  Neg Hx     Social History Social History   Tobacco Use   Smoking status: Current Every Day Smoker    Packs/day: 0.50    Types: Cigarettes   Smokeless tobacco: Never Used  Substance Use Topics   Alcohol use: No    Alcohol/week: 0.0 standard drinks   Drug use: No     Allergies   Hydrocodone   Review of Systems Review of Systems  Constitutional: Negative for fever.  Eyes: Negative for visual disturbance.  Respiratory: Negative for cough and shortness of breath.   Cardiovascular: Negative for chest pain.  Gastrointestinal: Positive for abdominal pain, anal bleeding, nausea and vomiting (  one episode).  Genitourinary: Negative for difficulty urinating.  Musculoskeletal: Negative for back pain and neck pain.  Skin: Negative for rash.  Neurological: Negative for syncope, light-headedness and headaches.     Physical Exam Updated Vital Signs BP 110/68 (BP Location: Left Arm)    Pulse 82    Temp (!) 97.2 F (36.2 C) (Oral)    Resp 16    Ht 5\' 4"  (1.626 m)    Wt 99.8 kg    SpO2 99%    BMI 37.76 kg/m   Physical Exam Vitals signs and nursing note reviewed.  Constitutional:      General: She is not in acute distress.    Appearance: She is well-developed. She is not diaphoretic.  HENT:     Head: Normocephalic and atraumatic.  Eyes:     Conjunctiva/sclera: Conjunctivae normal.  Neck:     Musculoskeletal: Normal range of motion.  Cardiovascular:     Rate and Rhythm: Normal rate and regular rhythm.     Heart sounds: Normal heart sounds. No murmur. No friction rub. No gallop.   Pulmonary:     Effort: Pulmonary effort is normal. No respiratory distress.     Breath sounds: Normal breath sounds. No wheezing or rales.  Abdominal:     General: There is no distension.     Palpations: Abdomen is soft.     Tenderness: There is abdominal tenderness. There is no guarding.  Genitourinary:    Rectum: Tenderness and external hemorrhoid present.  Musculoskeletal:        General:  No tenderness.  Skin:    General: Skin is warm and dry.     Findings: No erythema or rash.  Neurological:     Mental Status: She is alert and oriented to person, place, and time.      ED Treatments / Results  Labs (all labs ordered are listed, but only abnormal results are displayed) Labs Reviewed  COMPREHENSIVE METABOLIC PANEL - Abnormal; Notable for the following components:      Result Value   Glucose, Bld 118 (*)    All other components within normal limits  CBC WITH DIFFERENTIAL/PLATELET  POC OCCULT BLOOD, ED    EKG None  Radiology Ct Abdomen Pelvis W Contrast  Result Date: 04/11/2019 CLINICAL DATA:  Abdominal pain, hematochezia EXAM: CT ABDOMEN AND PELVIS WITH CONTRAST TECHNIQUE: Multidetector CT imaging of the abdomen and pelvis was performed using the standard protocol following bolus administration of intravenous contrast. CONTRAST:  151mL OMNIPAQUE IOHEXOL 300 MG/ML  SOLN COMPARISON:  CT abdomen 11/19/2015 FINDINGS: Lower chest: Lung bases are clear. Normal heart size. No pericardial effusion. Hepatobiliary: No focal liver abnormality is seen. No gallstones, gallbladder wall thickening, or biliary dilatation. Pancreas: Unremarkable. No pancreatic ductal dilatation or surrounding inflammatory changes. Spleen: Normal in size without focal abnormality. Adrenals/Urinary Tract: Normal adrenal glands. Nonobstructing calculi are present in the left renal collecting system. No obstructing urolithiasis or hydronephrosis. No concerning renal masses. Ureters are normal course and caliber. Urinary bladder is unremarkable. Stomach/Bowel: Distal esophagus, stomach and duodenal sweep are unremarkable. No bowel wall thickening or dilatation. No evidence of obstruction. A normal appendix is visualized. Mild intramural fat deposition within the cecum, nonspecific. There is mild edematous mural thickening from the level of the splenic flexure to the rectosigmoid junction. Mild faint hazy  pericolonic stranding is noted. Vascular/Lymphatic: Atherosclerotic plaque within the normal caliber aorta. No aneurysm or ectasia. No suspicious or enlarged lymph nodes in the included lymphatic chains. Reproductive: Uterus is surgically  absent. No concerning adnexal lesions. Other: No abdominopelvic free fluid or free gas. No bowel containing hernias. Small fat containing umbilical hernia. Rounded subcutaneous nodule in the low anterior pelvis measuring 9 mm in size. Musculoskeletal: Multilevel degenerative changes are present in the imaged portions of the spine. No acute osseous abnormality or suspicious osseous lesion. IMPRESSION: 1. Mild edematous mural thickening from the level of the splenic flexure to the rectosigmoid junction, consistent with colitis, either infectious or inflammatory. 2. Intramural fat deposition of the cecum, nonspecific can be seen in the setting of chronic postinflammatory change or lipomatosis. 3. Nonobstructing left nephrolithiasis. 4. Rounded 9 mm subcutaneous nodule of the low anterior pelvis, could reflect a small dermal inclusion cyst or sebaceous cyst. 5. Aortic Atherosclerosis (ICD10-I70.0). Electronically Signed   By: Lovena Le M.D.   On: 04/11/2019 21:22    Procedures Procedures (including critical care time)  Medications Ordered in ED Medications  sodium chloride 0.9 % bolus 1,000 mL (0 mLs Intravenous Stopped 04/11/19 2239)  iohexol (OMNIPAQUE) 300 MG/ML solution 100 mL (100 mLs Intravenous Contrast Given 04/11/19 2104)     Initial Impression / Assessment and Plan / ED Course  I have reviewed the triage vital signs and the nursing notes.  Pertinent labs & imaging results that were available during my care of the patient were reviewed by me and considered in my medical decision making (see chart for details).         58 year old female with a history of MI, acquired hypothyroidism, hypertension, hyperlipidemia, COPD, chronic diastolic heart failure  presents with concern for abdominal discomfort and rectal bleeding.  Pt with chronically lower blood pressures, noted to have BP 95 systolic on prior visits, and BP in ED largely in 100s.    Rectal exam with hemorrhoids, unable to obtain stool sample for hemoccult, but hx is consistent with rectal bleeding.   Given abdominal pain, CT abdomen pelvis ordered which showed findings consistent with colitis from the splenic flexure to the rectosigmoid junction.  Discussed with patient that this may be infectious or inflammatory in nature.  She has no known history of inflammatory bowel disease, has not had similar episodes in the past, feel infectious etiology is most likely at this time, although intramural fat deposition of cecum also present.  Hemoglobin is normal, and blood pressures remain normal in the emergency department.  She was given IV fluids with concern for dehydration given decreased p.o. intake today with frequent bowel movements.  She did pass very small mixture of mucus and blood while in the ED.  Suspect rectal bleeding present secondary to patient's colitis and/or presence of hemorrhoids.  Given hemodynamic stability, no lightheadedness, no significant rectal bleeding by history or seen in the emergency department and normal hgb, feel she is stable for outpatient care.  Given abx for colitis and recommend PCP follow up, GI follow up if symptoms continue or recur or if recommended by PCP. Discussed reasons to return.  Final Clinical Impressions(s) / ED Diagnoses   Final diagnoses:  Colitis  Rectal bleeding    ED Discharge Orders         Ordered    metroNIDAZOLE (FLAGYL) 500 MG tablet  2 times daily     04/11/19 2211    ciprofloxacin (CIPRO) 500 MG tablet  Every 12 hours     04/11/19 2211           Gareth Morgan, MD 04/12/19 0013

## 2019-04-11 NOTE — ED Notes (Signed)
Pt taken to CT scan.

## 2019-04-16 DIAGNOSIS — I5032 Chronic diastolic (congestive) heart failure: Secondary | ICD-10-CM | POA: Diagnosis not present

## 2019-04-16 DIAGNOSIS — E782 Mixed hyperlipidemia: Secondary | ICD-10-CM | POA: Diagnosis not present

## 2019-04-16 DIAGNOSIS — Z131 Encounter for screening for diabetes mellitus: Secondary | ICD-10-CM | POA: Diagnosis not present

## 2019-04-16 DIAGNOSIS — I10 Essential (primary) hypertension: Secondary | ICD-10-CM | POA: Diagnosis not present

## 2019-04-16 DIAGNOSIS — E1165 Type 2 diabetes mellitus with hyperglycemia: Secondary | ICD-10-CM | POA: Diagnosis not present

## 2019-04-16 DIAGNOSIS — E039 Hypothyroidism, unspecified: Secondary | ICD-10-CM | POA: Diagnosis not present

## 2019-07-30 DIAGNOSIS — I5032 Chronic diastolic (congestive) heart failure: Secondary | ICD-10-CM | POA: Diagnosis not present

## 2019-07-30 DIAGNOSIS — J3089 Other allergic rhinitis: Secondary | ICD-10-CM | POA: Diagnosis not present

## 2019-07-30 DIAGNOSIS — E1165 Type 2 diabetes mellitus with hyperglycemia: Secondary | ICD-10-CM | POA: Diagnosis not present

## 2019-07-30 DIAGNOSIS — E039 Hypothyroidism, unspecified: Secondary | ICD-10-CM | POA: Diagnosis not present

## 2019-07-30 DIAGNOSIS — I10 Essential (primary) hypertension: Secondary | ICD-10-CM | POA: Diagnosis not present

## 2019-08-29 DIAGNOSIS — E1165 Type 2 diabetes mellitus with hyperglycemia: Secondary | ICD-10-CM | POA: Diagnosis not present

## 2019-08-29 DIAGNOSIS — E039 Hypothyroidism, unspecified: Secondary | ICD-10-CM | POA: Diagnosis not present

## 2019-08-29 DIAGNOSIS — I10 Essential (primary) hypertension: Secondary | ICD-10-CM | POA: Diagnosis not present

## 2019-08-29 DIAGNOSIS — I5032 Chronic diastolic (congestive) heart failure: Secondary | ICD-10-CM | POA: Diagnosis not present

## 2019-08-29 DIAGNOSIS — E782 Mixed hyperlipidemia: Secondary | ICD-10-CM | POA: Diagnosis not present

## 2019-09-11 DIAGNOSIS — E1165 Type 2 diabetes mellitus with hyperglycemia: Secondary | ICD-10-CM | POA: Diagnosis not present

## 2019-09-11 DIAGNOSIS — E782 Mixed hyperlipidemia: Secondary | ICD-10-CM | POA: Diagnosis not present

## 2019-09-11 DIAGNOSIS — I5032 Chronic diastolic (congestive) heart failure: Secondary | ICD-10-CM | POA: Diagnosis not present

## 2019-09-11 DIAGNOSIS — I10 Essential (primary) hypertension: Secondary | ICD-10-CM | POA: Diagnosis not present

## 2019-09-11 DIAGNOSIS — E039 Hypothyroidism, unspecified: Secondary | ICD-10-CM | POA: Diagnosis not present

## 2019-09-24 DIAGNOSIS — I5032 Chronic diastolic (congestive) heart failure: Secondary | ICD-10-CM | POA: Diagnosis not present

## 2019-09-24 DIAGNOSIS — I10 Essential (primary) hypertension: Secondary | ICD-10-CM | POA: Diagnosis not present

## 2019-09-24 DIAGNOSIS — E782 Mixed hyperlipidemia: Secondary | ICD-10-CM | POA: Diagnosis not present

## 2019-09-24 DIAGNOSIS — Z0001 Encounter for general adult medical examination with abnormal findings: Secondary | ICD-10-CM | POA: Diagnosis not present

## 2019-09-24 DIAGNOSIS — E039 Hypothyroidism, unspecified: Secondary | ICD-10-CM | POA: Diagnosis not present

## 2019-09-24 DIAGNOSIS — E1165 Type 2 diabetes mellitus with hyperglycemia: Secondary | ICD-10-CM | POA: Diagnosis not present

## 2019-10-25 DIAGNOSIS — I10 Essential (primary) hypertension: Secondary | ICD-10-CM | POA: Diagnosis not present

## 2019-10-25 DIAGNOSIS — I5032 Chronic diastolic (congestive) heart failure: Secondary | ICD-10-CM | POA: Diagnosis not present

## 2019-10-25 DIAGNOSIS — E1165 Type 2 diabetes mellitus with hyperglycemia: Secondary | ICD-10-CM | POA: Diagnosis not present

## 2019-10-25 DIAGNOSIS — E782 Mixed hyperlipidemia: Secondary | ICD-10-CM | POA: Diagnosis not present

## 2019-10-25 DIAGNOSIS — E039 Hypothyroidism, unspecified: Secondary | ICD-10-CM | POA: Diagnosis not present

## 2019-11-05 DIAGNOSIS — I10 Essential (primary) hypertension: Secondary | ICD-10-CM | POA: Diagnosis not present

## 2019-11-05 DIAGNOSIS — E039 Hypothyroidism, unspecified: Secondary | ICD-10-CM | POA: Diagnosis not present

## 2019-11-05 DIAGNOSIS — M25512 Pain in left shoulder: Secondary | ICD-10-CM | POA: Diagnosis not present

## 2019-11-05 DIAGNOSIS — I5032 Chronic diastolic (congestive) heart failure: Secondary | ICD-10-CM | POA: Diagnosis not present

## 2019-11-05 DIAGNOSIS — E1165 Type 2 diabetes mellitus with hyperglycemia: Secondary | ICD-10-CM | POA: Diagnosis not present

## 2019-11-15 ENCOUNTER — Encounter: Payer: Self-pay | Admitting: Interventional Cardiology

## 2019-11-15 ENCOUNTER — Ambulatory Visit (INDEPENDENT_AMBULATORY_CARE_PROVIDER_SITE_OTHER): Payer: Medicare Other | Admitting: Interventional Cardiology

## 2019-11-15 ENCOUNTER — Other Ambulatory Visit: Payer: Self-pay

## 2019-11-15 VITALS — BP 98/64 | HR 95 | Ht 64.0 in | Wt 219.8 lb

## 2019-11-15 DIAGNOSIS — I700 Atherosclerosis of aorta: Secondary | ICD-10-CM

## 2019-11-15 DIAGNOSIS — I10 Essential (primary) hypertension: Secondary | ICD-10-CM | POA: Diagnosis not present

## 2019-11-15 DIAGNOSIS — E7800 Pure hypercholesterolemia, unspecified: Secondary | ICD-10-CM

## 2019-11-15 DIAGNOSIS — I25119 Atherosclerotic heart disease of native coronary artery with unspecified angina pectoris: Secondary | ICD-10-CM | POA: Diagnosis not present

## 2019-11-15 NOTE — Progress Notes (Signed)
Cardiology Office Note   Date:  11/15/2019   ID:  Jocelyn [name], DOB Mar 23, 1961, MRN [id number]  PCP:  Jocelyn Mccreedy, MD    No chief complaint on file.  CAD  Wt Readings from Last 3 Encounters:  11/15/19 219 lb 12.8 oz (99.7 kg)  04/11/19 220 lb (99.8 kg)  03/27/19 220 lb 6.4 oz (100 kg)       History of Present Illness: Jocelyn [name] is a 59 y.o. female  who has had coronary artery disease. She had a chronically occluded obtuse marginal noted on catheterization from January 2017.   Denies : Chest pain. Dizziness. Leg edema. Nitroglycerin use. Orthopnea. Palpitations. Paroxysmal nocturnal dyspnea. Shortness of breath. Syncope.   Back pain limits her walking.  She may get a stationary bike.  She added garlic tabs and honey and this has helped her feel better. She is cutting back on meat. She juices for nutrition.      Past Medical History:  Diagnosis Date  . Anxiety   . Arthritis   . Chronic diastolic CHF (congestive heart failure), NYHA class 2 ([place])   . Chronic kidney disease    kidney stones hx  . COPD (chronic obstructive pulmonary disease) ([place])   . Depression   . HTN (hypertension)   . Hypercholesterolemia   . Hypothyroidism (acquired)   . Myocardial infarction [facility])    x3- pt unsure if MIs were in 1996 or 1999, had stent fixed in 2016 but no MI at that time per pt  . Thyrotoxicosis   . TIA (transient ischemic attack)    2013    Past Surgical History:  Procedure Laterality Date  . BREAST CYST EXCISION    . CARDIAC CATHETERIZATION N/A 08/24/2015   Procedure: Left Heart Cath and Coronary Angiography;  Surgeon: Jocelyn Booze, MD;  Location: [facility] CV LAB;  Service: Cardiovascular;  Laterality: N/A;  . COLONOSCOPY    . [place]   and then had procedure to repair in 2016  . KNEE SURGERY    . partial hysterec    . THYROIDECTOMY       Current Outpatient Medications  Medication Sig Dispense Refill  . aspirin  325 MG tablet Take 325 mg by mouth daily.    . cyclobenzaprine (FLEXERIL) 10 MG tablet Take 1 tablet (10 mg total) by mouth 3 (three) times daily as needed for muscle spasms. 21 tablet 0  . estradiol (ESTRACE) 0.5 MG tablet Take 0.5 mg by mouth daily.  0  . furosemide (LASIX) 40 MG tablet Take 40 mg by mouth daily.     . isosorbide mononitrate (IMDUR) 30 MG 24 hr tablet Take 2 tablets (60 mg total) by mouth daily. 30 tablet 5  . nitroGLYCERIN (NITROSTAT) 0.3 MG SL tablet TAKE 1 TABLET UNDER THE TONGUE EVERY 5 MINUTES AS NEEDED FOR CHEST PAIN  0  . simvastatin (ZOCOR) 20 MG tablet Take 20 mg by mouth at bedtime.  1  . spironolactone (ALDACTONE) 25 MG tablet Take 25 mg by mouth 2 (two) times daily.  1  . triamcinolone cream (KENALOG) 0.5 % Apply 1 application topically daily as needed (for sun exposure).    Marland Kitchen UNABLE TO FIND Med Name: Garlic and Honey (homemade regimen) - two cloves a day with tsp of honey    . albuterol (PROVENTIL HFA;VENTOLIN HFA) 108 (90 Base) MCG/ACT inhaler Inhale 2 puffs into the lungs every 4 (four) hours as needed for shortness of breath.     Marland Kitchen  levothyroxine (SYNTHROID) 112 MCG tablet Take 112 mcg by mouth every morning.     No current facility-administered medications for this visit.    Allergies:   Hydrocodone    Social History:  The patient  reports that she has been smoking cigarettes. She has been smoking about 0.50 packs per day. She has never used smokeless tobacco. She reports that she does not drink alcohol or use drugs.   Family History:  The patient's family history includes Cancer in her father; Colon cancer in her father; Jocelyn [name]' disease in her father; Heart disease in her father and mother; Hypertension in her maternal grandmother and mother; Stroke in her brother and mother.    ROS:  Please see the history of present illness.   Otherwise, review of systems are positive for taking herbal meds at the guidance of her brother who is holistic doctor.   All  other systems are reviewed and negative.    PHYSICAL EXAM: VS:  BP 98/64   Pulse 95   Ht 5\' 4"  (1.626 m)   Wt 219 lb 12.8 oz (99.7 kg)   SpO2 97%   BMI 37.73 kg/m  , BMI Body mass index is 37.73 kg/m. GEN: Well nourished, well developed, in no acute distress  HEENT: normal  Neck: no JVD, carotid bruits, or masses Cardiac: RRR; no murmurs, rubs, or gallops,no edema  Respiratory:  clear to auscultation bilaterally, normal work of breathing GI: soft, nontender, nondistended, + BS MS: no deformity or atrophy  Skin: warm and dry, no rash Neuro:  Strength and sensation are intact Psych: euthymic mood, full affect   EKG:   The ekg ordered today demonstrates NSR, inferior and lateral Q waves   Recent Labs: 03/27/2019: TSH 0.18 04/11/2019: ALT 20; BUN 12; Creatinine, Ser 0.87; Hemoglobin 13.9; Platelets 256; Potassium 3.6; Sodium 138   Lipid Panel No results found for: CHOL, TRIG, HDL, CHOLHDL, VLDL, LDLCALC, LDLDIRECT   Other studies Reviewed: Additional studies/ records that were reviewed today with results demonstrating: TC 231 in 12/2018.   ASSESSMENT AND PLAN:  1. CAD: No angina.  Continue aggressive secondary prevention.  She is trying to improve lifestyle with healthier diet.  She does need to try to get more exercise.  Consider stationary bike. 2. HTN: The current medical regimen is effective;  continue present plan and medications. 3. Hyperlipidemia: Rechecked earlier this week with primary care doctor.  Continue simvastatin.  She may benefit from a higher dose but in general, she is hesitant to add more medicine.  Would like to see LDL at 70. 4. Aortic atherosclerosis: Secondary prevention. Continue statin. 5. We discussed COVID vaccine.  I recommended it but she is not interested.   Current medicines are reviewed at length with the patient today.  The patient concerns regarding her medicines were addressed.  The following changes have been made:  No change  Labs/  tests ordered today include:  No orders of the defined types were placed in this encounter.   Recommend 150 minutes/week of aerobic exercise Low fat, low carb, high fiber diet recommended  Disposition:   FU in 1 year   Signed, Larae Grooms, MD  11/15/2019 10:14 AM    [facility] [place], [place], [place]  [address] Phone: [phone]; Fax: [phone]

## 2019-11-15 NOTE — Patient Instructions (Signed)
Medication Instructions:  Your physician recommends that you continue on your current medications as directed. Please refer to the Current Medication list given to you today.  *If you need a refill on your cardiac medications before your next appointment, please call your pharmacy*   Lab Work: None ordered  If you have labs (blood work) drawn today and your tests are completely normal, you will receive your results only by: . MyChart Message (if you have MyChart) OR . A paper copy in the mail If you have any lab test that is abnormal or we need to change your treatment, we will call you to review the results.   Testing/Procedures: None ordered   Follow-Up: At [facility], you and your health needs are our priority.  As part of our continuing mission to provide you with exceptional heart care, we have created designated Provider Care Teams.  These Care Teams include your primary Cardiologist (physician) and Advanced Practice Providers (APPs -  Physician Assistants and Nurse Practitioners) who all work together to provide you with the care you need, when you need it.  We recommend signing up for the patient portal called "MyChart".  Sign up information is provided on this After Visit Summary.  MyChart is used to connect with patients for Virtual Visits (Telemedicine).  Patients are able to view lab/test results, encounter notes, upcoming appointments, etc.  Non-urgent messages can be sent to your provider as well.   To learn more about what you can do with MyChart, go to [email or web address].    Your next appointment:   12 month(s)  The format for your next appointment:   In Person  Provider:   You may see Jayadeep Varanasi, MD or one of the following Advanced Practice Providers on your designated Care Team:    Dayna Dunn, PA-C  Michele Lenze, PA-C    Other Instructions  High-Fiber Diet Fiber, also called dietary fiber, is a type of carbohydrate that is found in fruits,  vegetables, whole grains, and beans. A high-fiber diet can have many health benefits. Your health care provider may recommend a high-fiber diet to help:  Prevent constipation. Fiber can make your bowel movements more regular.  Lower your cholesterol.  Relieve the following conditions: ? Swelling of veins in the anus (hemorrhoids). ? Swelling and irritation (inflammation) of specific areas of the digestive tract (uncomplicated diverticulosis). ? A problem of the large intestine (colon) that sometimes causes pain and diarrhea (irritable bowel syndrome, IBS).  Prevent overeating as part of a weight-loss plan.  Prevent heart disease, type 2 diabetes, and certain cancers. What is my plan? The recommended daily fiber intake in grams (g) includes:  38 g for men age 50 or younger.  30 g for men over age 50.  25 g for women age 50 or younger.  21 g for women over age 50. You can get the recommended daily intake of dietary fiber by:  Eating a variety of fruits, vegetables, grains, and beans.  Taking a fiber supplement, if it is not possible to get enough fiber through your diet. What do I need to know about a high-fiber diet?  It is better to get fiber through food sources rather than from fiber supplements. There is not a lot of research about how effective supplements are.  Always check the fiber content on the nutrition facts label of any prepackaged food. Look for foods that contain 5 g of fiber or more per serving.  Talk with a diet and nutrition specialist (  dietitian) if you have questions about specific foods that are recommended or not recommended for your medical condition, especially if those foods are not listed below.  Gradually increase how much fiber you consume. If you increase your intake of dietary fiber too quickly, you may have bloating, cramping, or gas.  Drink plenty of water. Water helps you to digest fiber. What are tips for following this plan?  Eat a wide  variety of high-fiber foods.  Make sure that half of the grains that you eat each day are whole grains.  Eat breads and cereals that are made with whole-grain flour instead of refined flour or white flour.  Eat brown rice, bulgur wheat, or millet instead of white rice.  Start the day with a breakfast that is high in fiber, such as a cereal that contains 5 g of fiber or more per serving.  Use beans in place of meat in soups, salads, and pasta dishes.  Eat high-fiber snacks, such as berries, raw vegetables, nuts, and popcorn.  Choose whole fruits and vegetables instead of processed forms like juice or sauce. What foods can I eat?  Fruits Berries. Pears. Apples. Oranges. Avocado. Prunes and raisins. Dried figs. Vegetables Sweet potatoes. Spinach. Kale. Artichokes. Cabbage. Broccoli. Cauliflower. Green peas. Carrots. Squash. Grains Whole-grain breads. Multigrain cereal. Oats and oatmeal. Brown rice. Barley. Bulgur wheat. Millet. Quinoa. Bran muffins. Popcorn. Rye wafer crackers. Meats and other proteins Navy, kidney, and pinto beans. Soybeans. Split peas. Lentils. Nuts and seeds. Dairy Fiber-fortified yogurt. Beverages Fiber-fortified soy milk. Fiber-fortified orange juice. Other foods Fiber bars. The items listed above may not be a complete list of recommended foods and beverages. Contact a dietitian for more options. What foods are not recommended? Fruits Fruit juice. Cooked, strained fruit. Vegetables Fried potatoes. Canned vegetables. Well-cooked vegetables. Grains White bread. Pasta made with refined flour. White rice. Meats and other proteins Fatty cuts of meat. Fried chicken or fried fish. Dairy Milk. Yogurt. Cream cheese. Sour cream. Fats and oils Butters. Beverages Soft drinks. Other foods Cakes and pastries. The items listed above may not be a complete list of foods and beverages to avoid. Contact a dietitian for more information. Summary  Fiber is a type of  carbohydrate. It is found in fruits, vegetables, whole grains, and beans.  There are many health benefits of eating a high-fiber diet, such as preventing constipation, lowering blood cholesterol, helping with weight loss, and reducing your risk of heart disease, diabetes, and certain cancers.  Gradually increase your intake of fiber. Increasing too fast can result in cramping, bloating, and gas. Drink plenty of water while you increase your fiber.  The best sources of fiber include whole fruits and vegetables, whole grains, nuts, seeds, and beans. This information is not intended to replace advice given to you by your health care provider. Make sure you discuss any questions you have with your health care provider. Document Revised: 05/22/2017 Document Reviewed: 05/22/2017 Elsevier Patient Education  2020 Elsevier Inc.   

## 2019-11-30 ENCOUNTER — Other Ambulatory Visit: Payer: Self-pay | Admitting: Endocrinology

## 2019-11-30 NOTE — Telephone Encounter (Signed)
1.  Please schedule f/u appt 2.  Then please refill x 1, pending that appt.  

## 2019-12-03 ENCOUNTER — Other Ambulatory Visit: Payer: Self-pay | Admitting: Endocrinology

## 2019-12-05 DIAGNOSIS — I5032 Chronic diastolic (congestive) heart failure: Secondary | ICD-10-CM | POA: Diagnosis not present

## 2019-12-05 DIAGNOSIS — I10 Essential (primary) hypertension: Secondary | ICD-10-CM | POA: Diagnosis not present

## 2019-12-05 DIAGNOSIS — E1165 Type 2 diabetes mellitus with hyperglycemia: Secondary | ICD-10-CM | POA: Diagnosis not present

## 2019-12-05 DIAGNOSIS — K5909 Other constipation: Secondary | ICD-10-CM | POA: Diagnosis not present

## 2019-12-05 DIAGNOSIS — K625 Hemorrhage of anus and rectum: Secondary | ICD-10-CM | POA: Diagnosis not present

## 2019-12-19 ENCOUNTER — Other Ambulatory Visit: Payer: Self-pay | Admitting: Endocrinology

## 2019-12-19 ENCOUNTER — Telehealth: Payer: Self-pay | Admitting: Endocrinology

## 2019-12-19 ENCOUNTER — Other Ambulatory Visit: Payer: Self-pay

## 2019-12-19 DIAGNOSIS — E890 Postprocedural hypothyroidism: Secondary | ICD-10-CM

## 2019-12-19 MED ORDER — LEVOTHYROXINE SODIUM 112 MCG PO TABS: 112.0000 ug | ORAL_TABLET | Freq: Every morning | ORAL | 0 refills | Status: DC

## 2019-12-19 NOTE — Telephone Encounter (Signed)
Medication Refill Request  Did you call your pharmacy and request this refill first? Yes  . If patient has not contacted pharmacy first, instruct them to do so for future refills.  . Remind them that contacting the pharmacy for their refill is the quickest method to get the refill.  . Refill policy also stated that it will take anywhere between 24-72 hours to receive the refill.    Name of medication? levothyroxine  Is this a 90 day supply? yes  Name and location of pharmacy?  [id number] [address], [place] Phone:  [phone]  Fax:  [phone]     --- patient scheduled for 12/27/19

## 2019-12-19 NOTE — Telephone Encounter (Signed)
Outpatient Medication Detail   Disp Refills Start End   levothyroxine (SYNTHROID) 112 MCG tablet 30 tablet 0 12/19/2019    Sig - Route: Take 1 tablet (112 mcg total) by mouth every morning. OVERDUE FOR AN APPT. WILL PROVIDE 30 DAY SUPPLY - Oral   Sent to pharmacy as: levothyroxine (SYNTHROID) 112 MCG tablet   E-Prescribing Status: Receipt confirmed by pharmacy (12/19/2019  4:02 PM EDT)

## 2019-12-25 ENCOUNTER — Other Ambulatory Visit: Payer: Self-pay

## 2019-12-27 ENCOUNTER — Telehealth: Payer: Self-pay

## 2019-12-27 ENCOUNTER — Ambulatory Visit (INDEPENDENT_AMBULATORY_CARE_PROVIDER_SITE_OTHER): Payer: Medicare Other | Admitting: Endocrinology

## 2019-12-27 ENCOUNTER — Encounter: Payer: Self-pay | Admitting: Endocrinology

## 2019-12-27 ENCOUNTER — Other Ambulatory Visit: Payer: Self-pay

## 2019-12-27 VITALS — BP 128/70 | HR 98 | Ht 64.0 in | Wt 223.0 lb

## 2019-12-27 DIAGNOSIS — E890 Postprocedural hypothyroidism: Secondary | ICD-10-CM

## 2019-12-27 DIAGNOSIS — H0520 Unspecified exophthalmos: Secondary | ICD-10-CM | POA: Insufficient documentation

## 2019-12-27 LAB — T4, FREE: Free T4: 0.97 ng/dL (ref 0.60–1.60)

## 2019-12-27 LAB — TSH: TSH: 0.51 u[IU]/mL (ref 0.35–4.50)

## 2019-12-27 NOTE — Progress Notes (Signed)
Subjective:    Patient ID: Jocelyn [name], female    DOB: [birth year], 59 y.o.   MRN: [id number]  HPI Pt returns for f/u of post-RAI hypothyroidism (she had partial thyroidectomy in 1989; she then intermittently took synthroid intermittently; synthroid was stopped in early 2019, due to suppressed TSH; she had RAI 11/19; she started synthroid 1/20).   pt states she feels well in general, except for weight gain.  She take synthroid as rx'ed.    Past Medical History:  Diagnosis Date  . Anxiety   . Arthritis   . Chronic diastolic CHF (congestive heart failure), NYHA class 2 ([place])   . Chronic kidney disease    kidney stones hx  . COPD (chronic obstructive pulmonary disease) ([place])   . Depression   . HTN (hypertension)   . Hypercholesterolemia   . Hypothyroidism (acquired)   . Myocardial infarction [facility])    x3- pt unsure if MIs were in 1996 or 1999, had stent fixed in 2016 but no MI at that time per pt  . Thyrotoxicosis   . TIA (transient ischemic attack)    2013    Past Surgical History:  Procedure Laterality Date  . BREAST CYST EXCISION    . CARDIAC CATHETERIZATION N/A 08/24/2015   Procedure: Left Heart Cath and Coronary Angiography;  Surgeon: Jettie Booze, MD;  Location: [place] [facility];  Service: Cardiovascular;  Laterality: N/A;  . COLONOSCOPY    . [place]   and then had procedure to repair in 2016  . KNEE SURGERY    . partial hysterec    . THYROIDECTOMY      Social History   Socioeconomic History  . Marital status: Legally Separated    Spouse name: Not on file  . Number of children: 2  . Years of education: Not on file  . Highest education level: Not on file  Occupational History  . Occupation: [employer]  Tobacco Use  . Smoking status: Current Every Day Smoker    Packs/day: 0.50    Types: Cigarettes  . Smokeless tobacco: Never Used  Substance and Sexual Activity  . Alcohol use: No    Alcohol/week: 0.0 standard drinks  . Drug use: No    . Sexual activity: Not on file  Other Topics Concern  . Not on file  Social History Narrative  . Not on file   Social Determinants of Health   Financial Resource Strain:   . Difficulty of Paying Living Expenses:   Food Insecurity:   . Worried About Charity fundraiser in the Last Year:   . Arboriculturist in the Last Year:   Transportation Needs:   . Film/video editor (Medical):   Marland Kitchen Lack of Transportation (Non-Medical):   Physical Activity:   . Days of Exercise per Week:   . Minutes of Exercise per Session:   Stress:   . Feeling of Stress :   Social Connections:   . Frequency of Communication with Friends and Family:   . Frequency of Social Gatherings with Friends and Family:   . Attends Religious Services:   . Active Member of Clubs or Organizations:   . Attends Archivist Meetings:   Marland Kitchen Marital Status:   Intimate Partner Violence:   . Fear of Current or Ex-Partner:   . Emotionally Abused:   Marland Kitchen Physically Abused:   . Sexually Abused:     Current Outpatient Medications on File Prior to Visit  Medication  Sig Dispense Refill  . aspirin 325 MG tablet Take 325 mg by mouth daily.    . cyclobenzaprine (FLEXERIL) 10 MG tablet Take 1 tablet (10 mg total) by mouth 3 (three) times daily as needed for muscle spasms. 21 tablet 0  . estradiol (ESTRACE) 0.5 MG tablet Take 0.5 mg by mouth daily.  0  . furosemide (LASIX) 40 MG tablet Take 40 mg by mouth daily.     . isosorbide mononitrate (IMDUR) 30 MG 24 hr tablet Take 2 tablets (60 mg total) by mouth daily. 30 tablet 5  . nitroGLYCERIN (NITROSTAT) 0.3 MG SL tablet TAKE 1 TABLET UNDER THE TONGUE EVERY 5 MINUTES AS NEEDED FOR CHEST PAIN  0  . simvastatin (ZOCOR) 20 MG tablet Take 20 mg by mouth at bedtime.  1  . spironolactone (ALDACTONE) 25 MG tablet Take 25 mg by mouth 2 (two) times daily.  1  . triamcinolone cream (KENALOG) 0.5 % Apply 1 application topically daily as needed (for sun exposure).    Marland Kitchen UNABLE TO FIND Med  Name: Garlic and Honey (homemade regimen) - two cloves a day with tsp of honey    . albuterol (PROVENTIL HFA;VENTOLIN HFA) 108 (90 Base) MCG/ACT inhaler Inhale 2 puffs into the lungs every 4 (four) hours as needed for shortness of breath.      No current facility-administered medications on file prior to visit.    Allergies  Allergen Reactions  . Hydrocodone Nausea Only    Has problems with high doses    Family History  Problem Relation Age of Onset  . Heart disease Mother   . Stroke Mother   . Hypertension Mother   . Heart disease Father   . Cancer Father   . Graves' disease Father   . Colon cancer Father        pt unsure of age onset, believes he was in his 42s  . Stroke Brother   . Hypertension Maternal Grandmother   . Heart attack Neg Hx   . Esophageal cancer Neg Hx   . Stomach cancer Neg Hx   . Rectal cancer Neg Hx     BP 128/70   Pulse 98   Ht 5\' 4"  (1.626 m)   Wt 223 lb (101.2 kg)   SpO2 93%   BMI 38.28 kg/m     Review of Systems Denies palpitations.      Objective:   Physical Exam VITAL SIGNS:  See vs page GENERAL: no distress EYES: bilat proptosis.   NECK: There is no palpable thyroid enlargement.  No thyroid nodule is palpable.  No palpable lymphadenopathy at the anterior neck.   Lab Results  Component Value Date   TSH 0.51 12/27/2019      Assessment & Plan:  Hypothyroidism: well-replaced. Please continue the same medication Please come back for a follow-up appointment in 6 months

## 2019-12-27 NOTE — Patient Instructions (Addendum)
Thyroid blood tests are requested for you today.  We'll let you know about the results.  Please see an eye specialist.  you will receive a phone call, about a day and time for an appointment Please come back for a follow-up appointment in 6 months.

## 2019-12-27 NOTE — Telephone Encounter (Signed)
-----   Message from [name], MD sent at 12/27/2019  4:36 PM EDT ----- please contact patient: Normal.  Please continue the same medication.  I'll see you next time.

## 2019-12-27 NOTE — Telephone Encounter (Signed)
RESULTS  Results were reviewed by Dr. Ellison. A letter has been mailed to pt home address. For future reference, letter can be found in Epic. 

## 2019-12-29 MED ORDER — LEVOTHYROXINE SODIUM 112 MCG PO TABS: 112.0000 ug | ORAL_TABLET | Freq: Every morning | ORAL | 3 refills | Status: DC

## 2019-12-31 DIAGNOSIS — I10 Essential (primary) hypertension: Secondary | ICD-10-CM | POA: Diagnosis not present

## 2019-12-31 DIAGNOSIS — K5909 Other constipation: Secondary | ICD-10-CM | POA: Diagnosis not present

## 2019-12-31 DIAGNOSIS — I5032 Chronic diastolic (congestive) heart failure: Secondary | ICD-10-CM | POA: Diagnosis not present

## 2019-12-31 DIAGNOSIS — E1165 Type 2 diabetes mellitus with hyperglycemia: Secondary | ICD-10-CM | POA: Diagnosis not present

## 2019-12-31 DIAGNOSIS — E782 Mixed hyperlipidemia: Secondary | ICD-10-CM | POA: Diagnosis not present

## 2020-01-16 DIAGNOSIS — Z79899 Other long term (current) drug therapy: Secondary | ICD-10-CM | POA: Diagnosis not present

## 2020-01-31 DIAGNOSIS — H524 Presbyopia: Secondary | ICD-10-CM | POA: Diagnosis not present

## 2020-01-31 DIAGNOSIS — E0500 Thyrotoxicosis with diffuse goiter without thyrotoxic crisis or storm: Secondary | ICD-10-CM | POA: Diagnosis not present

## 2020-02-13 DIAGNOSIS — Z79899 Other long term (current) drug therapy: Secondary | ICD-10-CM | POA: Diagnosis not present

## 2020-02-14 DIAGNOSIS — E1165 Type 2 diabetes mellitus with hyperglycemia: Secondary | ICD-10-CM | POA: Diagnosis not present

## 2020-02-14 DIAGNOSIS — I5032 Chronic diastolic (congestive) heart failure: Secondary | ICD-10-CM | POA: Diagnosis not present

## 2020-02-14 DIAGNOSIS — K5909 Other constipation: Secondary | ICD-10-CM | POA: Diagnosis not present

## 2020-02-14 DIAGNOSIS — I10 Essential (primary) hypertension: Secondary | ICD-10-CM | POA: Diagnosis not present

## 2020-02-14 DIAGNOSIS — E782 Mixed hyperlipidemia: Secondary | ICD-10-CM | POA: Diagnosis not present

## 2020-03-24 DIAGNOSIS — I10 Essential (primary) hypertension: Secondary | ICD-10-CM | POA: Diagnosis not present

## 2020-03-24 DIAGNOSIS — E1165 Type 2 diabetes mellitus with hyperglycemia: Secondary | ICD-10-CM | POA: Diagnosis not present

## 2020-03-24 DIAGNOSIS — I5032 Chronic diastolic (congestive) heart failure: Secondary | ICD-10-CM | POA: Diagnosis not present

## 2020-03-24 DIAGNOSIS — M25571 Pain in right ankle and joints of right foot: Secondary | ICD-10-CM | POA: Diagnosis not present

## 2020-03-24 DIAGNOSIS — K5909 Other constipation: Secondary | ICD-10-CM | POA: Diagnosis not present

## 2020-04-03 DIAGNOSIS — I5032 Chronic diastolic (congestive) heart failure: Secondary | ICD-10-CM | POA: Diagnosis not present

## 2020-04-03 DIAGNOSIS — L723 Sebaceous cyst: Secondary | ICD-10-CM | POA: Diagnosis not present

## 2020-04-03 DIAGNOSIS — I10 Essential (primary) hypertension: Secondary | ICD-10-CM | POA: Diagnosis not present

## 2020-04-03 DIAGNOSIS — K5909 Other constipation: Secondary | ICD-10-CM | POA: Diagnosis not present

## 2020-04-03 DIAGNOSIS — E1165 Type 2 diabetes mellitus with hyperglycemia: Secondary | ICD-10-CM | POA: Diagnosis not present

## 2020-05-12 DIAGNOSIS — I10 Essential (primary) hypertension: Secondary | ICD-10-CM | POA: Diagnosis not present

## 2020-05-12 DIAGNOSIS — E782 Mixed hyperlipidemia: Secondary | ICD-10-CM | POA: Diagnosis not present

## 2020-05-12 DIAGNOSIS — I5032 Chronic diastolic (congestive) heart failure: Secondary | ICD-10-CM | POA: Diagnosis not present

## 2020-05-12 DIAGNOSIS — K529 Noninfective gastroenteritis and colitis, unspecified: Secondary | ICD-10-CM | POA: Diagnosis not present

## 2020-05-12 DIAGNOSIS — E1165 Type 2 diabetes mellitus with hyperglycemia: Secondary | ICD-10-CM | POA: Diagnosis not present

## 2020-05-15 ENCOUNTER — Other Ambulatory Visit: Payer: Self-pay

## 2020-05-15 ENCOUNTER — Inpatient Hospital Stay (HOSPITAL_BASED_OUTPATIENT_CLINIC_OR_DEPARTMENT_OTHER)
Admission: EM | Admit: 2020-05-15 | Discharge: 2020-05-21 | DRG: 699 | Disposition: A | Discharge status: Home / Self Care | Payer: Medicare Other | Attending: Internal Medicine | Admitting: Internal Medicine

## 2020-05-15 ENCOUNTER — Emergency Department (HOSPITAL_BASED_OUTPATIENT_CLINIC_OR_DEPARTMENT_OTHER): Payer: Medicare Other

## 2020-05-15 ENCOUNTER — Encounter (HOSPITAL_BASED_OUTPATIENT_CLINIC_OR_DEPARTMENT_OTHER): Payer: Self-pay | Admitting: Emergency Medicine

## 2020-05-15 DIAGNOSIS — Z7982 Long term (current) use of aspirin: Secondary | ICD-10-CM

## 2020-05-15 DIAGNOSIS — Z8673 Personal history of transient ischemic attack (TIA), and cerebral infarction without residual deficits: Secondary | ICD-10-CM | POA: Diagnosis not present

## 2020-05-15 DIAGNOSIS — E876 Hypokalemia: Secondary | ICD-10-CM | POA: Diagnosis present

## 2020-05-15 DIAGNOSIS — I700 Atherosclerosis of aorta: Secondary | ICD-10-CM | POA: Diagnosis not present

## 2020-05-15 DIAGNOSIS — Z955 Presence of coronary angioplasty implant and graft: Secondary | ICD-10-CM | POA: Diagnosis not present

## 2020-05-15 DIAGNOSIS — I130 Hypertensive heart and chronic kidney disease with heart failure and stage 1 through stage 4 chronic kidney disease, or unspecified chronic kidney disease: Secondary | ICD-10-CM | POA: Diagnosis present

## 2020-05-15 DIAGNOSIS — E871 Hypo-osmolality and hyponatremia: Secondary | ICD-10-CM | POA: Diagnosis not present

## 2020-05-15 DIAGNOSIS — N182 Chronic kidney disease, stage 2 (mild): Secondary | ICD-10-CM | POA: Diagnosis not present

## 2020-05-15 DIAGNOSIS — F17210 Nicotine dependence, cigarettes, uncomplicated: Secondary | ICD-10-CM | POA: Diagnosis not present

## 2020-05-15 DIAGNOSIS — F419 Anxiety disorder, unspecified: Secondary | ICD-10-CM | POA: Diagnosis not present

## 2020-05-15 DIAGNOSIS — R9431 Abnormal electrocardiogram [ECG] [EKG]: Secondary | ICD-10-CM | POA: Diagnosis not present

## 2020-05-15 DIAGNOSIS — F32A Depression, unspecified: Secondary | ICD-10-CM | POA: Diagnosis not present

## 2020-05-15 DIAGNOSIS — Z20822 Contact with and (suspected) exposure to covid-19: Secondary | ICD-10-CM | POA: Diagnosis present

## 2020-05-15 DIAGNOSIS — R1012 Left upper quadrant pain: Secondary | ICD-10-CM

## 2020-05-15 DIAGNOSIS — J449 Chronic obstructive pulmonary disease, unspecified: Secondary | ICD-10-CM | POA: Diagnosis present

## 2020-05-15 DIAGNOSIS — Z8249 Family history of ischemic heart disease and other diseases of the circulatory system: Secondary | ICD-10-CM

## 2020-05-15 DIAGNOSIS — D735 Infarction of spleen: Secondary | ICD-10-CM | POA: Diagnosis not present

## 2020-05-15 DIAGNOSIS — Z885 Allergy status to narcotic agent status: Secondary | ICD-10-CM

## 2020-05-15 DIAGNOSIS — Z79890 Hormone replacement therapy (postmenopausal): Secondary | ICD-10-CM

## 2020-05-15 DIAGNOSIS — Z720 Tobacco use: Secondary | ICD-10-CM | POA: Diagnosis not present

## 2020-05-15 DIAGNOSIS — E039 Hypothyroidism, unspecified: Secondary | ICD-10-CM | POA: Diagnosis present

## 2020-05-15 DIAGNOSIS — E890 Postprocedural hypothyroidism: Secondary | ICD-10-CM | POA: Diagnosis not present

## 2020-05-15 DIAGNOSIS — E7800 Pure hypercholesterolemia, unspecified: Secondary | ICD-10-CM | POA: Diagnosis present

## 2020-05-15 DIAGNOSIS — R0902 Hypoxemia: Secondary | ICD-10-CM | POA: Diagnosis not present

## 2020-05-15 DIAGNOSIS — I252 Old myocardial infarction: Secondary | ICD-10-CM | POA: Diagnosis not present

## 2020-05-15 DIAGNOSIS — I2510 Atherosclerotic heart disease of native coronary artery without angina pectoris: Secondary | ICD-10-CM | POA: Diagnosis not present

## 2020-05-15 DIAGNOSIS — M899 Disorder of bone, unspecified: Secondary | ICD-10-CM

## 2020-05-15 DIAGNOSIS — Z87442 Personal history of urinary calculi: Secondary | ICD-10-CM

## 2020-05-15 DIAGNOSIS — I5042 Chronic combined systolic (congestive) and diastolic (congestive) heart failure: Secondary | ICD-10-CM | POA: Diagnosis not present

## 2020-05-15 DIAGNOSIS — I513 Intracardiac thrombosis, not elsewhere classified: Secondary | ICD-10-CM | POA: Diagnosis present

## 2020-05-15 DIAGNOSIS — N280 Ischemia and infarction of kidney: Principal | ICD-10-CM | POA: Diagnosis present

## 2020-05-15 DIAGNOSIS — R109 Unspecified abdominal pain: Secondary | ICD-10-CM | POA: Diagnosis not present

## 2020-05-15 DIAGNOSIS — I25119 Atherosclerotic heart disease of native coronary artery with unspecified angina pectoris: Secondary | ICD-10-CM | POA: Diagnosis not present

## 2020-05-15 DIAGNOSIS — R918 Other nonspecific abnormal finding of lung field: Secondary | ICD-10-CM | POA: Diagnosis not present

## 2020-05-15 DIAGNOSIS — Z66 Do not resuscitate: Secondary | ICD-10-CM | POA: Diagnosis not present

## 2020-05-15 DIAGNOSIS — I10 Essential (primary) hypertension: Secondary | ICD-10-CM | POA: Diagnosis present

## 2020-05-15 DIAGNOSIS — Z79899 Other long term (current) drug therapy: Secondary | ICD-10-CM

## 2020-05-15 DIAGNOSIS — E785 Hyperlipidemia, unspecified: Secondary | ICD-10-CM | POA: Diagnosis present

## 2020-05-15 LAB — CBC
HCT: 41.9 % (ref 36.0–46.0)
Hemoglobin: 14.5 g/dL (ref 12.0–15.0)
MCH: 30.1 pg (ref 26.0–34.0)
MCHC: 34.6 g/dL (ref 30.0–36.0)
MCV: 86.9 fL (ref 80.0–100.0)
Platelets: 213 10*3/uL (ref 150–400)
RBC: 4.82 MIL/uL (ref 3.87–5.11)
RDW: 13 % (ref 11.5–15.5)
WBC: 11.1 10*3/uL — ABNORMAL HIGH (ref 4.0–10.5)
nRBC: 0 % (ref 0.0–0.2)

## 2020-05-15 LAB — COMPREHENSIVE METABOLIC PANEL
ALT: 23 U/L (ref 0–44)
AST: 32 U/L (ref 15–41)
Albumin: 3.8 g/dL (ref 3.5–5.0)
Alkaline Phosphatase: 48 U/L (ref 38–126)
Anion gap: 13 (ref 5–15)
BUN: 15 mg/dL (ref 6–20)
CO2: 23 mmol/L (ref 22–32)
Calcium: 9.4 mg/dL (ref 8.9–10.3)
Chloride: 96 mmol/L — ABNORMAL LOW (ref 98–111)
Creatinine, Ser: 1.1 mg/dL — ABNORMAL HIGH (ref 0.44–1.00)
GFR, Estimated: 55 mL/min — ABNORMAL LOW (ref >60)
Glucose, Bld: 93 mg/dL (ref 70–99)
Potassium: 3.4 mmol/L — ABNORMAL LOW (ref 3.5–5.1)
Sodium: 132 mmol/L — ABNORMAL LOW (ref 135–145)
Total Bilirubin: 0.5 mg/dL (ref 0.3–1.2)
Total Protein: 7.9 g/dL (ref 6.5–8.1)

## 2020-05-15 LAB — URINALYSIS, ROUTINE W REFLEX MICROSCOPIC
Bilirubin Urine: NEGATIVE
Glucose, UA: NEGATIVE mg/dL
Hgb urine dipstick: NEGATIVE
Ketones, ur: NEGATIVE mg/dL
Nitrite: NEGATIVE
Protein, ur: NEGATIVE mg/dL
Specific Gravity, Urine: 1.01 (ref 1.005–1.030)
pH: 5.5 (ref 5.0–8.0)

## 2020-05-15 LAB — LIPASE, BLOOD: Lipase: 22 U/L (ref 11–51)

## 2020-05-15 LAB — URINALYSIS, MICROSCOPIC (REFLEX): RBC / HPF: NONE SEEN RBC/hpf (ref 0–5)

## 2020-05-15 MED ORDER — SODIUM CHLORIDE 0.9 % IV BOLUS
500.0000 mL | Freq: Once | INTRAVENOUS | Status: AC
  Administered 2020-05-15: 500 mL via INTRAVENOUS

## 2020-05-15 MED ORDER — IOHEXOL 300 MG/ML  SOLN
100.0000 mL | Freq: Once | INTRAMUSCULAR | Status: AC | PRN
  Administered 2020-05-15: 100 mL via INTRAVENOUS

## 2020-05-15 MED ORDER — ONDANSETRON HCL 4 MG/2ML IJ SOLN
4.0000 mg | Freq: Once | INTRAMUSCULAR | Status: AC
  Administered 2020-05-15: 4 mg via INTRAVENOUS
  Filled 2020-05-15: qty 2

## 2020-05-15 MED ORDER — MORPHINE SULFATE (PF) 4 MG/ML IV SOLN
4.0000 mg | Freq: Once | INTRAVENOUS | Status: AC
  Administered 2020-05-15: 4 mg via INTRAVENOUS
  Filled 2020-05-15: qty 1

## 2020-05-15 NOTE — ED Triage Notes (Signed)
Reports having food poisoning on Monday.  Was seen at PCP on Tuesday and started on flagyl.  Now c/o left abdominal pain.  No more n/v/d since Monday.

## 2020-05-15 NOTE — ED Notes (Signed)
Patient transported to CT 

## 2020-05-15 NOTE — ED Provider Notes (Addendum)
[facility] EMERGENCY DEPARTMENT Provider Note   CSN: [id number] Arrival date & time: 05/15/20  1628     History Chief Complaint  Patient presents with  . Abdominal Pain    Jocelyn [name] is a 59 y.o. female.  HPI      59 year old female with history of CHF, CKD, COPD, depression, hypertension, hyperlipidemia, hypothyroidism, MI, thyrotoxicosis, TIA, presents with concern for left-sided abdominal pain after having nausea, vomiting and diarrhea earlier this week.  Reports she ate food on Monday and approximately 30 minutes later began to have nausea, vomiting and diarrhea.  Reports that she had had several episodes of both.  She started off.  Tuesday was started on Flagyl.  Reports she is had no bowel movements over the last couple days.  Has had some continuing nausea, but no vomiting.  Left-sided abdominal pain in began today.  It is severe.  No known relieving or exacerbating factors.  Denies dysuria, fevers or other concerns  Past Medical History:  Diagnosis Date  . Anxiety   . Arthritis   . Chronic diastolic CHF (congestive heart failure), NYHA class 2 ([place])   . Chronic kidney disease    kidney stones hx  . COPD (chronic obstructive pulmonary disease) ([place])   . Depression   . HTN (hypertension)   . Hypercholesterolemia   . Hypothyroidism (acquired)   . Myocardial infarction [facility])    x3- pt unsure if MIs were in 1996 or 1999, had stent fixed in 2016 but no MI at that time per pt  . Thyrotoxicosis   . TIA (transient ischemic attack)    2013    Patient Active Problem List   Diagnosis Date Noted  . Renal infarct ([place]) 05/16/2020  . Proptosis 12/27/2019  . Multinodular goiter 03/27/2019  . Hypothyroidism 09/26/2018  . Sacroiliitis, not elsewhere classified ([place]) 04/24/2017  . Chronic right-sided low back pain with right-sided sciatica 01/12/2017  . Chronic bilateral thoracic back pain 01/12/2017  . Other spondylosis with radiculopathy, lumbar region  10/19/2016  . Depression with anxiety 07/16/2016  . Chronic diastolic CHF (congestive heart failure) ([place]) 07/16/2016  . Aortic atherosclerosis ([place]) 11/24/2015  . Unstable angina ([place]) 08/21/2015  . Chest pain   . Coronary artery disease   . CAD (coronary artery disease), native coronary artery 01/14/2015  . Hypertension 01/14/2015  . Hypercholesterolemia 01/14/2015  . Tobacco abuse 01/14/2015    Past Surgical History:  Procedure Laterality Date  . BREAST CYST EXCISION    . CARDIAC CATHETERIZATION N/A 08/24/2015   Procedure: Left Heart Cath and Coronary Angiography;  Surgeon: Jettie Booze, MD;  Location: [facility] CV LAB;  Service: Cardiovascular;  Laterality: N/A;  . COLONOSCOPY    . [place]   and then had procedure to repair in 2016  . KNEE SURGERY    . partial hysterec    . THYROIDECTOMY       OB History   No obstetric history on file.     Family History  Problem Relation Age of Onset  . Heart disease Mother   . Stroke Mother   . Hypertension Mother   . Heart disease Father   . Cancer Father   . Graves' disease Father   . Colon cancer Father        pt unsure of age onset, believes he was in his 3s  . Stroke Brother   . Hypertension Maternal Grandmother   . Heart attack Neg Hx   . Esophageal  cancer Neg Hx   . Stomach cancer Neg Hx   . Rectal cancer Neg Hx     Social History   Tobacco Use  . Smoking status: Current Every Day Smoker    Packs/day: 0.50    Types: Cigarettes  . Smokeless tobacco: Never Used  Vaping Use  . Vaping Use: Never used  Substance Use Topics  . Alcohol use: No    Alcohol/week: 0.0 standard drinks  . Drug use: No    Home Medications Prior to Admission medications   Medication Sig Start Date End Date Taking? Authorizing Provider  albuterol (PROVENTIL HFA;VENTOLIN HFA) 108 (90 Base) MCG/ACT inhaler Inhale 2 puffs into the lungs every 4 (four) hours as needed for shortness of breath.  06/21/15  12/22/16  [provider]  aspirin 325 MG tablet Take 325 mg by mouth daily.    [provider]  cyclobenzaprine (FLEXERIL) 10 MG tablet Take 1 tablet (10 mg total) by mouth 3 (three) times daily as needed for muscle spasms. 03/29/17   [name], NP  estradiol (ESTRACE) 0.5 MG tablet Take 0.5 mg by mouth daily. 08/15/17   [provider]  furosemide (LASIX) 40 MG tablet Take 40 mg by mouth daily.     [provider]  isosorbide mononitrate (IMDUR) 30 MG 24 hr tablet Take 2 tablets (60 mg total) by mouth daily. 07/17/16   [name], MD  levothyroxine (SYNTHROID) 112 MCG tablet Take 1 tablet (112 mcg total) by mouth every morning. 12/29/19   [name], MD  nitroGLYCERIN (NITROSTAT) 0.3 MG SL tablet TAKE 1 TABLET UNDER THE TONGUE EVERY 5 MINUTES AS NEEDED FOR CHEST PAIN 10/28/15   [provider]  simvastatin (ZOCOR) 20 MG tablet Take 20 mg by mouth at bedtime. 01/03/15   [provider]  spironolactone (ALDACTONE) 25 MG tablet Take 25 mg by mouth 2 (two) times daily. 01/03/15   [provider]  triamcinolone cream (KENALOG) 0.5 % Apply 1 application topically daily as needed (for sun exposure).    [provider]  UNABLE TO FIND Med Name: Garlic and Honey (homemade regimen) - two cloves a day with tsp of honey    [provider]    Allergies    Hydrocodone  Review of Systems   Review of Systems  Constitutional: Negative for fever.  Respiratory: Negative for cough and shortness of breath.   Cardiovascular: Negative for chest pain.  Gastrointestinal: Positive for abdominal pain and nausea. Negative for constipation, diarrhea (now resolved) and vomiting.  Genitourinary: Negative for difficulty urinating.  Musculoskeletal: Negative for back pain and neck pain.  Skin: Negative for rash.  Neurological: Negative for syncope and headaches.    Physical Exam Updated Vital Signs BP 112/80   Pulse 89   Temp 98.6  F (37 C)   Resp 20   Ht 5\' 4"  (1.626 m)   Wt 93.9 kg   SpO2 99%   BMI 35.53 kg/m   Physical Exam Vitals and nursing note reviewed.  Constitutional:      General: She is not in acute distress.    Appearance: She is well-developed. She is not diaphoretic.  HENT:     Head: Normocephalic and atraumatic.  Eyes:     Conjunctiva/sclera: Conjunctivae normal.  Cardiovascular:     Rate and Rhythm: Normal rate and regular rhythm.     Heart sounds: Normal heart sounds. No murmur heard.  No friction rub. No gallop.   Pulmonary:  Effort: Pulmonary effort is normal. No respiratory distress.     Breath sounds: Normal breath sounds. No wheezing or rales.  Abdominal:     General: There is no distension.     Palpations: Abdomen is soft.     Tenderness: There is abdominal tenderness in the left upper quadrant and left lower quadrant. There is no guarding.  Musculoskeletal:        General: No tenderness.     Cervical back: Normal range of motion.  Skin:    General: Skin is warm and dry.     Findings: No erythema or rash.  Neurological:     Mental Status: She is alert and oriented to person, place, and time.     ED Results / Procedures / Treatments   Labs (all labs ordered are listed, but only abnormal results are displayed) Labs Reviewed  COMPREHENSIVE METABOLIC PANEL - Abnormal; Notable for the following components:      Result Value   Sodium 132 (*)    Potassium 3.4 (*)    Chloride 96 (*)    Creatinine, Ser 1.10 (*)    GFR, Estimated 55 (*)    All other components within normal limits  CBC - Abnormal; Notable for the following components:   WBC 11.1 (*)    All other components within normal limits  URINALYSIS, ROUTINE W REFLEX MICROSCOPIC - Abnormal; Notable for the following components:   Leukocytes,Ua TRACE (*)    All other components within normal limits  URINALYSIS, MICROSCOPIC (REFLEX) - Abnormal; Notable for the following components:   Bacteria, UA FEW (*)    All  other components within normal limits  RESP PANEL BY RT PCR (RSV, FLU A&B, COVID)  LIPASE, BLOOD  HEPARIN LEVEL (UNFRACTIONATED)    EKG None  Radiology CT ABDOMEN PELVIS W CONTRAST  Result Date: 05/15/2020 CLINICAL DATA:  Left-sided abdominal pain EXAM: CT ABDOMEN AND PELVIS WITH CONTRAST TECHNIQUE: Multidetector CT imaging of the abdomen and pelvis was performed using the standard protocol following bolus administration of intravenous contrast. CONTRAST:  127mL OMNIPAQUE IOHEXOL 300 MG/ML  SOLN COMPARISON:  04/11/2019 FINDINGS: Lower chest: No acute abnormality. Hepatobiliary: Mild fatty infiltration of the liver is noted. The gallbladder is within normal limits. Pancreas: Unremarkable. No pancreatic ductal dilatation or surrounding inflammatory changes. Spleen: Wedge shaped area of decreased attenuation is noted along the superior aspect of the spleen which may represent prior infarct. Some volume loss in this region is noted which supports the possibility of underlying infarct. Adrenals/Urinary Tract: Adrenal glands are within normal limits. Right kidney demonstrates nonobstructing renal stone measuring 3 mm in the lower pole. Right ureter is within normal limits. The bladder is well distended. Left kidney demonstrates 2 patchy areas of decreased attenuation which persist on delayed images. Given the findings in the spleen these may also represent renal infarcts although focal pyelonephritis deserves consideration given the mild elevation of white blood cell count. Nonobstructing calculi are noted in the left kidney is well. The largest of these measures 10 mm in the lower pole. No obstructive changes are seen. Stomach/Bowel: Colon is predominately decompressed. No obstructive or inflammatory changes are noted. The appendix is within normal limits. No small bowel or gastric abnormality is seen. Vascular/Lymphatic: Aortic atherosclerosis. No enlarged abdominal or pelvic lymph nodes. Reproductive:  Status post hysterectomy. No adnexal masses. Other: No abdominal wall hernia or abnormality. No abdominopelvic ascites. Musculoskeletal: No acute or significant osseous findings. IMPRESSION: Multifocal patchy areas of decreased enhancement within the left kidney with  some associated inflammatory changes. These most likely represent areas of focal pyelonephritis although the possibility of renal infarct could not be totally excluded. Geographic area of decreased attenuation with some volume loss in the spleen. This also has the appearance of a focal infarct and is new from the prior exam. Bilateral nonobstructing renal stones larger on the left than the right. Electronically Signed   By: Inez Catalina M.D.   On: 05/15/2020 23:04    Procedures .Critical Care Performed by: Gareth Morgan, MD Authorized by: Gareth Morgan, MD   Critical care provider statement:    Critical care time (minutes):  45   Critical care was time spent personally by me on the following activities:  Examination of patient, ordering and performing treatments and interventions, ordering and review of laboratory studies, ordering and review of radiographic studies, pulse oximetry, re-evaluation of patient's condition, obtaining history from patient or surrogate and review of old charts   (including critical care time)  Medications Ordered in ED Medications  heparin ADULT infusion 100 units/mL (25000 units/257mL sodium chloride 0.45%) (1,200 Units/hr Intravenous New Bag/Given 05/16/20 0038)  sodium chloride 0.9 % bolus 500 mL (500 mLs Intravenous New Bag/Given 05/15/20 2210)  ondansetron (ZOFRAN) injection 4 mg (4 mg Intravenous Given 05/15/20 2211)  morphine 4 MG/ML injection 4 mg (4 mg Intravenous Given 05/15/20 2212)  iohexol (OMNIPAQUE) 300 MG/ML solution 100 mL (100 mLs Intravenous Contrast Given 05/15/20 2242)  heparin bolus via infusion 4,000 Units (4,000 Units Intravenous Bolus from Bag 05/16/20 0038)    ED Course    I have reviewed the triage vital signs and the nursing notes.  Pertinent labs & imaging results that were available during my care of the patient were reviewed by me and considered in my medical decision making (see chart for details).    MDM Rules/Calculators/A&P                          59 year old female with history of CHF, CKD, COPD, depression, hypertension, hyperlipidemia, hypothyroidism, MI, thyrotoxicosis, TIA, presents with concern for left-sided abdominal pain after having nausea, vomiting and diarrhea earlier this week. Labs show mild hypokalemia, creatinine of 1.1, mild leukocytosis, no signs of hepatitis or pancreatitis. DDx includes appendicitis, pancreatitis, cholecystitis, pyelonephritis, nephrolithiasis, diverticulitis  CT abdomen pelvis was performed which showed multifocal patchy areas of decreased enhancement within the left kidney with some associated inflammatory changes, as well as area of decreased attenuation with some volume loss in the spleen.  Urinalysis does not look concerning for urinary tract infection, and feel pyelonephritis is less likely.  This is concerning for infarcts of both the spleen and the kidney.  Unclear etiology of infarcts.  No known history of atrial fibrillation, she is in sinus rhythm right now.  Heparin drip was initiated, will admit for thromboembolic work-up.   Final Clinical Impression(s) / ED Diagnoses Final diagnoses:  Splenic infarct  Renal infarct ([place])  Left upper quadrant abdominal pain    Rx / DC Orders ED Discharge Orders    None       Gareth Morgan, MD 05/16/20 3846    Gareth Morgan, MD 05/16/20 0139

## 2020-05-16 ENCOUNTER — Observation Stay (HOSPITAL_COMMUNITY): Payer: Medicare Other

## 2020-05-16 DIAGNOSIS — I10 Essential (primary) hypertension: Secondary | ICD-10-CM

## 2020-05-16 DIAGNOSIS — I700 Atherosclerosis of aorta: Secondary | ICD-10-CM

## 2020-05-16 DIAGNOSIS — I252 Old myocardial infarction: Secondary | ICD-10-CM | POA: Diagnosis not present

## 2020-05-16 DIAGNOSIS — F32A Depression, unspecified: Secondary | ICD-10-CM | POA: Diagnosis not present

## 2020-05-16 DIAGNOSIS — I2510 Atherosclerotic heart disease of native coronary artery without angina pectoris: Secondary | ICD-10-CM | POA: Diagnosis not present

## 2020-05-16 DIAGNOSIS — E890 Postprocedural hypothyroidism: Secondary | ICD-10-CM | POA: Diagnosis not present

## 2020-05-16 DIAGNOSIS — F17210 Nicotine dependence, cigarettes, uncomplicated: Secondary | ICD-10-CM | POA: Diagnosis not present

## 2020-05-16 DIAGNOSIS — R9431 Abnormal electrocardiogram [ECG] [EKG]: Secondary | ICD-10-CM

## 2020-05-16 DIAGNOSIS — D735 Infarction of spleen: Secondary | ICD-10-CM

## 2020-05-16 DIAGNOSIS — F419 Anxiety disorder, unspecified: Secondary | ICD-10-CM | POA: Diagnosis not present

## 2020-05-16 DIAGNOSIS — Z955 Presence of coronary angioplasty implant and graft: Secondary | ICD-10-CM | POA: Diagnosis not present

## 2020-05-16 DIAGNOSIS — E7800 Pure hypercholesterolemia, unspecified: Secondary | ICD-10-CM | POA: Diagnosis not present

## 2020-05-16 DIAGNOSIS — E785 Hyperlipidemia, unspecified: Secondary | ICD-10-CM | POA: Diagnosis not present

## 2020-05-16 DIAGNOSIS — N280 Ischemia and infarction of kidney: Secondary | ICD-10-CM | POA: Diagnosis not present

## 2020-05-16 DIAGNOSIS — Z8249 Family history of ischemic heart disease and other diseases of the circulatory system: Secondary | ICD-10-CM | POA: Diagnosis not present

## 2020-05-16 DIAGNOSIS — R0902 Hypoxemia: Secondary | ICD-10-CM | POA: Diagnosis not present

## 2020-05-16 DIAGNOSIS — Z8673 Personal history of transient ischemic attack (TIA), and cerebral infarction without residual deficits: Secondary | ICD-10-CM | POA: Diagnosis not present

## 2020-05-16 DIAGNOSIS — I513 Intracardiac thrombosis, not elsewhere classified: Secondary | ICD-10-CM | POA: Diagnosis not present

## 2020-05-16 DIAGNOSIS — Z66 Do not resuscitate: Secondary | ICD-10-CM | POA: Diagnosis not present

## 2020-05-16 DIAGNOSIS — N182 Chronic kidney disease, stage 2 (mild): Secondary | ICD-10-CM | POA: Diagnosis not present

## 2020-05-16 DIAGNOSIS — I25119 Atherosclerotic heart disease of native coronary artery with unspecified angina pectoris: Secondary | ICD-10-CM | POA: Diagnosis not present

## 2020-05-16 DIAGNOSIS — I5042 Chronic combined systolic (congestive) and diastolic (congestive) heart failure: Secondary | ICD-10-CM | POA: Diagnosis not present

## 2020-05-16 DIAGNOSIS — I130 Hypertensive heart and chronic kidney disease with heart failure and stage 1 through stage 4 chronic kidney disease, or unspecified chronic kidney disease: Secondary | ICD-10-CM | POA: Diagnosis not present

## 2020-05-16 DIAGNOSIS — E871 Hypo-osmolality and hyponatremia: Secondary | ICD-10-CM | POA: Diagnosis not present

## 2020-05-16 DIAGNOSIS — Z720 Tobacco use: Secondary | ICD-10-CM

## 2020-05-16 DIAGNOSIS — R918 Other nonspecific abnormal finding of lung field: Secondary | ICD-10-CM | POA: Diagnosis not present

## 2020-05-16 DIAGNOSIS — E876 Hypokalemia: Secondary | ICD-10-CM | POA: Diagnosis not present

## 2020-05-16 DIAGNOSIS — J449 Chronic obstructive pulmonary disease, unspecified: Secondary | ICD-10-CM | POA: Diagnosis not present

## 2020-05-16 DIAGNOSIS — Z20822 Contact with and (suspected) exposure to covid-19: Secondary | ICD-10-CM | POA: Diagnosis not present

## 2020-05-16 LAB — ECHOCARDIOGRAM COMPLETE
Area-P 1/2: 2.13 cm2
Height: 64 in
S' Lateral: 2.4 cm
Weight: 3382.74 oz

## 2020-05-16 LAB — CBC WITH DIFFERENTIAL/PLATELET
Abs Immature Granulocytes: 0.03 10*3/uL (ref 0.00–0.07)
Basophils Absolute: 0 10*3/uL (ref 0.0–0.1)
Basophils Relative: 0 %
Eosinophils Absolute: 0.1 10*3/uL (ref 0.0–0.5)
Eosinophils Relative: 1 %
HCT: 39.1 % (ref 36.0–46.0)
Hemoglobin: 13.4 g/dL (ref 12.0–15.0)
Immature Granulocytes: 0 %
Lymphocytes Relative: 25 %
Lymphs Abs: 2.2 10*3/uL (ref 0.7–4.0)
MCH: 30.2 pg (ref 26.0–34.0)
MCHC: 34.3 g/dL (ref 30.0–36.0)
MCV: 88.3 fL (ref 80.0–100.0)
Monocytes Absolute: 0.8 10*3/uL (ref 0.1–1.0)
Monocytes Relative: 9 %
Neutro Abs: 5.7 10*3/uL (ref 1.7–7.7)
Neutrophils Relative %: 65 %
Platelets: 214 10*3/uL (ref 150–400)
RBC: 4.43 MIL/uL (ref 3.87–5.11)
RDW: 13.2 % (ref 11.5–15.5)
WBC: 8.8 10*3/uL (ref 4.0–10.5)
nRBC: 0 % (ref 0.0–0.2)

## 2020-05-16 LAB — HEPARIN LEVEL (UNFRACTIONATED)
Heparin Unfractionated: 0.83 IU/mL — ABNORMAL HIGH (ref 0.30–0.70)
Heparin Unfractionated: 0.6 IU/mL (ref 0.30–0.70)
Heparin Unfractionated: 0.58 IU/mL (ref 0.30–0.70)

## 2020-05-16 LAB — COMPREHENSIVE METABOLIC PANEL
ALT: 20 U/L (ref 0–44)
AST: 23 U/L (ref 15–41)
Albumin: 3.4 g/dL — ABNORMAL LOW (ref 3.5–5.0)
Alkaline Phosphatase: 43 U/L (ref 38–126)
Anion gap: 11 (ref 5–15)
BUN: 12 mg/dL (ref 6–20)
CO2: 24 mmol/L (ref 22–32)
Calcium: 9 mg/dL (ref 8.9–10.3)
Chloride: 100 mmol/L (ref 98–111)
Creatinine, Ser: 0.98 mg/dL (ref 0.44–1.00)
GFR, Estimated: >60 mL/min (ref >60)
Glucose, Bld: 117 mg/dL — ABNORMAL HIGH (ref 70–99)
Potassium: 3.6 mmol/L (ref 3.5–5.1)
Sodium: 135 mmol/L (ref 135–145)
Total Bilirubin: 0.7 mg/dL (ref 0.3–1.2)
Total Protein: 7.1 g/dL (ref 6.5–8.1)

## 2020-05-16 LAB — RESP PANEL BY RT PCR (RSV, FLU A&B, COVID)
Influenza A by PCR: NEGATIVE
Influenza B by PCR: NEGATIVE
Respiratory Syncytial Virus by PCR: NEGATIVE
SARS Coronavirus 2 by RT PCR: NEGATIVE

## 2020-05-16 LAB — LACTIC ACID, PLASMA
Lactic Acid, Venous: 0.9 mmol/L (ref 0.5–1.9)
Lactic Acid, Venous: 1.2 mmol/L (ref 0.5–1.9)

## 2020-05-16 LAB — TROPONIN I (HIGH SENSITIVITY)
Troponin I (High Sensitivity): 113 ng/L (ref <18)
Troponin I (High Sensitivity): 123 ng/L (ref <18)

## 2020-05-16 LAB — HIV ANTIBODY (ROUTINE TESTING W REFLEX): HIV Screen 4th Generation wRfx: NONREACTIVE

## 2020-05-16 MED ORDER — SODIUM CHLORIDE 0.9% FLUSH
3.0000 mL | Freq: Two times a day (BID) | INTRAVENOUS | Status: DC
  Administered 2020-05-16 – 2020-05-21 (×4): 3 mL via INTRAVENOUS

## 2020-05-16 MED ORDER — ASPIRIN 325 MG PO TABS
325.0000 mg | ORAL_TABLET | Freq: Every day | ORAL | Status: DC
  Administered 2020-05-16: 325 mg via ORAL
  Filled 2020-05-16: qty 1

## 2020-05-16 MED ORDER — ACETAMINOPHEN 325 MG PO TABS: 650.0000 mg | ORAL_TABLET | Freq: Four times a day (QID) | ORAL | Status: DC | PRN

## 2020-05-16 MED ORDER — METOPROLOL TARTRATE 5 MG/5ML IV SOLN: 5.0000 mg | Freq: Four times a day (QID) | INTRAVENOUS | Status: DC | PRN

## 2020-05-16 MED ORDER — HYDROCODONE-ACETAMINOPHEN 5-325 MG PO TABS: 1.0000 | ORAL_TABLET | ORAL | Status: DC | PRN

## 2020-05-16 MED ORDER — GABAPENTIN 300 MG PO CAPS
300.0000 mg | ORAL_CAPSULE | Freq: Every day | ORAL | Status: DC
  Administered 2020-05-16 – 2020-05-21 (×6): 300 mg via ORAL
  Filled 2020-05-16 (×6): qty 1

## 2020-05-16 MED ORDER — LINACLOTIDE 145 MCG PO CAPS
145.0000 ug | ORAL_CAPSULE | Freq: Every day | ORAL | Status: DC
  Administered 2020-05-16 – 2020-05-21 (×6): 145 ug via ORAL
  Filled 2020-05-16 (×6): qty 1

## 2020-05-16 MED ORDER — HEPARIN (PORCINE) 25000 UT/250ML-% IV SOLN
1050.0000 [IU]/h | INTRAVENOUS | Status: DC
  Administered 2020-05-16 – 2020-05-20 (×5): 1050 [IU]/h via INTRAVENOUS
  Filled 2020-05-16 (×7): qty 250

## 2020-05-16 MED ORDER — HEPARIN BOLUS VIA INFUSION
4000.0000 [IU] | Freq: Once | INTRAVENOUS | Status: AC
  Administered 2020-05-16: 4000 [IU] via INTRAVENOUS

## 2020-05-16 MED ORDER — PERFLUTREN LIPID MICROSPHERE
1.0000 mL | INTRAVENOUS | Status: AC | PRN
  Administered 2020-05-16: 3 mL via INTRAVENOUS
  Filled 2020-05-16: qty 10

## 2020-05-16 MED ORDER — FUROSEMIDE 40 MG PO TABS
40.0000 mg | ORAL_TABLET | Freq: Every day | ORAL | Status: DC
  Administered 2020-05-16 – 2020-05-21 (×6): 40 mg via ORAL
  Filled 2020-05-16 (×6): qty 1

## 2020-05-16 MED ORDER — ACETAMINOPHEN 650 MG RE SUPP: 650.0000 mg | Freq: Four times a day (QID) | RECTAL | Status: DC | PRN

## 2020-05-16 MED ORDER — ALBUTEROL SULFATE HFA 108 (90 BASE) MCG/ACT IN AERS
2.0000 | INHALATION_SPRAY | RESPIRATORY_TRACT | Status: DC | PRN
  Filled 2020-05-16: qty 6.7

## 2020-05-16 MED ORDER — HEPARIN (PORCINE) 25000 UT/250ML-% IV SOLN
1200.0000 [IU]/h | INTRAVENOUS | Status: DC
  Administered 2020-05-16: 1200 [IU]/h via INTRAVENOUS
  Filled 2020-05-16: qty 250

## 2020-05-16 MED ORDER — CYCLOBENZAPRINE HCL 10 MG PO TABS: 10.0000 mg | ORAL_TABLET | Freq: Three times a day (TID) | ORAL | Status: DC | PRN

## 2020-05-16 MED ORDER — ISOSORBIDE MONONITRATE ER 60 MG PO TB24
60.0000 mg | ORAL_TABLET | Freq: Every day | ORAL | Status: DC
  Administered 2020-05-16 – 2020-05-21 (×6): 60 mg via ORAL
  Filled 2020-05-16 (×7): qty 1

## 2020-05-16 MED ORDER — LEVOTHYROXINE SODIUM 112 MCG PO TABS
112.0000 ug | ORAL_TABLET | Freq: Every morning | ORAL | Status: DC
  Administered 2020-05-16 – 2020-05-21 (×6): 112 ug via ORAL
  Filled 2020-05-16 (×7): qty 1

## 2020-05-16 MED ORDER — DOCUSATE SODIUM 100 MG PO CAPS: 100.0000 mg | ORAL_CAPSULE | Freq: Two times a day (BID) | ORAL | Status: DC | PRN

## 2020-05-16 MED ORDER — MORPHINE SULFATE (PF) 2 MG/ML IV SOLN
2.0000 mg | INTRAVENOUS | Status: DC | PRN
  Administered 2020-05-18 – 2020-05-19 (×3): 2 mg via INTRAVENOUS
  Filled 2020-05-16 (×3): qty 1

## 2020-05-16 NOTE — Progress Notes (Signed)
ANTICOAGULATION CONSULT NOTE - Initial Consult  Pharmacy Consult for Heparin Indication: splenic infarct  Allergies  Allergen Reactions  . Hydrocodone Nausea Only    Has problems with high doses    Patient Measurements: Height: 5\' 4"  (162.6 cm) Weight: 93.9 kg (207 lb) IBW/kg (Calculated) : 54.7 Heparin Dosing Weight: 77 kg  Vital Signs: Temp: 98.2 F (36.8 C) (10/15 2203) Temp Source: Oral (10/15 2007) BP: 111/79 (10/15 2203) Pulse Rate: 99 (10/15 2203)  Labs: Recent Labs    05/15/20 1650  HGB 14.5  HCT 41.9  PLT 213  CREATININE 1.10*    Estimated Creatinine Clearance: 62 mL/min (A) (by C-G formula based on SCr of 1.1 mg/dL (H)).   Medical History: Past Medical History:  Diagnosis Date  . Anxiety   . Arthritis   . Chronic diastolic CHF (congestive heart failure), NYHA class 2 ([place])   . Chronic kidney disease    kidney stones hx  . COPD (chronic obstructive pulmonary disease) ([place])   . Depression   . HTN (hypertension)   . Hypercholesterolemia   . Hypothyroidism (acquired)   . Myocardial infarction [facility])    x3- pt unsure if MIs were in 1996 or 1999, had stent fixed in 2016 but no MI at that time per pt  . Thyrotoxicosis   . TIA (transient ischemic attack)    2013    Medications:  See electronic med rec  Assessment: 59 y.o. F presents with abd pain. Found to have splenic infarct and pharmacy consulted for heparin gtt. No AC PTA. CBC ok on admission.  Goal of Therapy:  Heparin level 0.3-0.7 units/ml Monitor platelets by anticoagulation protocol: Yes   Plan:  Heparin IV bolus 4000 units Heparin gtt at 1200 units/hr Will f/u heparin level in 6 hours Daily heparin level and CBC  Sherlon Handing, PharmD, BCPS Please see amion for complete clinical pharmacist phone list 05/16/2020,12:22 AM

## 2020-05-16 NOTE — Progress Notes (Signed)
CRITICAL VALUE ALERT  Critical Value:  Troponin of 113  Date & Time Notied:  05/16/20 at 10:43  Provider Notified: Lodema Hong, MD   Orders Received/Actions taken: Awaiting Orders

## 2020-05-16 NOTE — Progress Notes (Signed)
ANTICOAGULATION CONSULT NOTE - Follow Up Consult  Pharmacy Consult for heparin Indication: renal and splenic infarcts  Allergies  Allergen Reactions  . Hydrocodone Nausea Only    Has problems with high doses    Patient Measurements: Height: 5\' 4"  (162.6 cm) Weight: 95.9 kg (211 lb 6.7 oz) IBW/kg (Calculated) : 54.7 Heparin Dosing Weight: 78 kg  Vital Signs: Temp: 97.9 F (36.6 C) (10/16 1953) Temp Source: Oral (10/16 1953) BP: 106/72 (10/16 1953) Pulse Rate: 83 (10/16 1953)  Labs: Recent Labs    05/15/20 1650 05/16/20 0820 05/16/20 1058 05/16/20 1556 05/16/20 2233  HGB 14.5 13.4  --   --   --   HCT 41.9 39.1  --   --   --   PLT 213 214  --   --   --   HEPARINUNFRC  --  0.83*  --  0.60 0.58  CREATININE 1.10* 0.98  --   --   --   TROPONINIHS  --  113* 123*  --   --     Estimated Creatinine Clearance: 70.3 mL/min (by C-G formula based on SCr of 0.98 mg/dL).   Assessment: Patient's a 59 y.o F presented to the ED on 10/15 with c/o abdominal pain and n/v.  Abdominal CT showed findings with concern for splenic and renal infarct.  Heparin drip started on 10/16.  Today, 05/16/2020: - Heparin level remains therapeutic at 0.58 on heparin infusion at 1050 units/hr - No bleeding or complications reported  Goal of Therapy:  Heparin level 0.3-0.7 units/ml Monitor platelets by anticoagulation protocol: Yes   Plan:  - Continue heparin drip at 1050 units/hr - Daily heparin level & CBC while on heparin - monitor for s/sx bleeding  Netta Cedars, PharmD, BCPS 05/16/2020,11:31 PM

## 2020-05-16 NOTE — ED Notes (Signed)
Went to check on patient and hooked her to monitor oxygen level was 89% on room air, repositioned and sat patient up in bed oxygen level remained at 89%, 2 LNC applied oxygen sats at 96%, patient denied SOB

## 2020-05-16 NOTE — Progress Notes (Signed)
ANTICOAGULATION CONSULT NOTE - Follow Up Consult  Pharmacy Consult for heparin Indication: renal and splenic infarcts  Allergies  Allergen Reactions  . Hydrocodone Nausea Only    Has problems with high doses    Patient Measurements: Height: 5\' 4"  (162.6 cm) Weight: 95.9 kg (211 lb 6.7 oz) IBW/kg (Calculated) : 54.7 Heparin Dosing Weight: 78 kg  Vital Signs: Temp: 98.2 F (36.8 C) (10/16 1649) Temp Source: Oral (10/16 1649) BP: 100/65 (10/16 1649) Pulse Rate: 77 (10/16 1649)  Labs: Recent Labs    05/15/20 1650 05/16/20 0820 05/16/20 1058 05/16/20 1556  HGB 14.5 13.4  --   --   HCT 41.9 39.1  --   --   PLT 213 214  --   --   HEPARINUNFRC  --  0.83*  --  0.60  CREATININE 1.10* 0.98  --   --   TROPONINIHS  --  113* 123*  --     Estimated Creatinine Clearance: 70.3 mL/min (by C-G formula based on SCr of 0.98 mg/dL).   Assessment: Patient's a 59 y.o F presented to the ED on 10/15 with c/o abdominal pain and n/v.  Abdominal CT showed findings with concern for splenic and renal infarct.  Heparin drip started on 10/16.  Today, 05/16/2020: - Heparin level therapeutic at 0.6 after rate decreased to 1050 units/hr - No bleeding or complications reported  Goal of Therapy:  Heparin level 0.3-0.7 units/ml Monitor platelets by anticoagulation protocol: Yes   Plan:  - Continue heparin drip at 1050 units/hr - Recheck 6 hr heparin level - monitor for s/sx bleeding  Peggyann Juba, PharmD, BCPS Pharmacy: [phone] 05/16/2020,4:50 PM

## 2020-05-16 NOTE — Progress Notes (Signed)
°  Echocardiogram 2D Echocardiogram has been performed.  Jocelyn [name] 05/16/2020, 1:06 PM

## 2020-05-16 NOTE — H&P (Signed)
History and Physical        Hospital Admission Note Date: 05/16/2020  Patient name: Jocelyn [name] Medical record number: [id number] Date of birth: 23-Mar-1961 Age: 59 y.o. Gender: female  PCP: Benito Mccreedy, MD  Patient coming from: Home   Chief Complaint    Chief Complaint  Patient presents with  . Abdominal Pain      HPI:   This is a 59 year old female with past medical history of HFpEF, CKD, CAD and MI s/p stent placement, tobacco abuse, depression, TIA, hypertension, hyperlipidemia, thyrotoxicosis and acquired hypothyroidism s/p thyroidectomy who presented to the ED with concerns for left-sided abdominal pain with associated nausea, vomiting and diarrhea since Monday this week.  Reports that she ate on Monday and 30 minutes later began to have nausea, vomiting and diarrhea.  Started on Flagyl this past Tuesday and has had improvement in her bowel movements since.  Does continue to have nausea and without vomiting now.  Left-sided abdominal pain began on 10/15 which is severe without any relieving or exacerbating factors fevers, dysuria or any other concerns.  Denies any known personal or family history of clotting disorders.  No leg swelling or pain, no known history of atrial fibrillation.  Of note, she does continue to smoke cigarettes and is also on HRT for menopause.  ED Course: Afebrile, tachycardic (84-104), tachypneic (18- 25), hemodynamically stable, initially on room air SPO2 noted to be 89% at 3 AM requiring 2 L/min then was changed back to RA.  Notable labs: Na 132, K3.4, Cl 96, creatinine 1.1, WBC 11.1, UA with few bacteria.  CT abdomen pelvis with contrast: Left renal infarcts VS. Focal pyelonephritis with nonobstructing bilateral nephrolithiasis (L>R) as well as splenic infarct. EKG with T wave inversions in V1-V4, troponin not obtained in ED.  In the ED she  received 500 cc bolus NS, morphine and was started heparin infusion with bolus  Vitals:   05/16/20 0407 05/16/20 0852  BP: 99/67 105/75  Pulse: 84 78  Resp: 18 19  Temp: 98.1 F (36.7 C) 97.9 F (36.6 C)  SpO2: 93% 98%     Review of Systems:  Review of Systems  Constitutional: Negative for chills and fever.  Respiratory: Negative for shortness of breath and wheezing.   Cardiovascular: Negative for chest pain, palpitations and leg swelling.  Gastrointestinal: Positive for abdominal pain, nausea and vomiting.  Genitourinary: Negative.   Musculoskeletal: Negative.   Neurological: Negative.   Endo/Heme/Allergies: Negative.  Does not bruise/bleed easily.  All other systems reviewed and are negative.   Medical/Social/Family History   Past Medical History: Past Medical History:  Diagnosis Date  . Anxiety   . Arthritis   . Chronic diastolic CHF (congestive heart failure), NYHA class 2 ([place])   . Chronic kidney disease    kidney stones hx  . COPD (chronic obstructive pulmonary disease) ([place])   . Depression   . HTN (hypertension)   . Hypercholesterolemia   . Hypothyroidism (acquired)   . Myocardial infarction [facility])    x3- pt unsure if MIs were in 1996 or 1999, had stent fixed in 2016 but no MI at that time per pt  . Thyrotoxicosis   . TIA (transient ischemic attack)  2013    Past Surgical History:  Procedure Laterality Date  . BREAST CYST EXCISION    . CARDIAC CATHETERIZATION N/A 08/24/2015   Procedure: Left Heart Cath and Coronary Angiography;  Surgeon: Jettie Booze, MD;  Location: [place] [facility];  Service: Cardiovascular;  Laterality: N/A;  . COLONOSCOPY    . [place]   and then had procedure to repair in 2016  . KNEE SURGERY    . partial hysterec    . THYROIDECTOMY      Medications: Prior to Admission medications   Medication Sig Start Date End Date Taking? Authorizing Provider  albuterol (PROVENTIL HFA;VENTOLIN HFA) 108 (90  Base) MCG/ACT inhaler Inhale 2 puffs into the lungs every 4 (four) hours as needed for shortness of breath.  06/21/15 05/16/20 Yes [provider]  ALPRAZolam [name]) 1 MG tablet Take 1 tablet by mouth 3 (three) times daily as needed for anxiety. 05/10/20  Yes [provider]  aspirin 325 MG tablet Take 325 mg by mouth daily.   Yes [provider]  cyclobenzaprine (FLEXERIL) 10 MG tablet Take 1 tablet (10 mg total) by mouth 3 (three) times daily as needed for muscle spasms. 03/29/17  Yes [name], NP  docusate sodium (COLACE) 100 MG capsule Take 100 mg by mouth 2 (two) times daily as needed for constipation. 12/19/19  Yes [provider]  estradiol (ESTRACE) 0.5 MG tablet Take 0.5 mg by mouth daily. 08/15/17  Yes [provider]  furosemide (LASIX) 40 MG tablet Take 40 mg by mouth daily.    Yes [provider]  gabapentin (NEURONTIN) 300 MG capsule Take 300 mg by mouth daily. 03/11/20  Yes [provider]  isosorbide mononitrate (IMDUR) 30 MG 24 hr tablet Take 2 tablets (60 mg total) by mouth daily. 07/17/16  Yes [name], MD  levothyroxine (SYNTHROID) 112 MCG tablet Take 1 tablet (112 mcg total) by mouth every morning. 12/29/19  Yes [name], MD  LINZESS 145 MCG CAPS capsule Take 145 mcg by mouth daily. 04/30/20  Yes [provider]  metroNIDAZOLE (FLAGYL) 500 MG tablet Take 500 mg by mouth 3 (three) times daily. 05/12/20  Yes [provider]  nitroGLYCERIN (NITROSTAT) 0.3 MG SL tablet TAKE 1 TABLET UNDER THE TONGUE EVERY 5 MINUTES AS NEEDED FOR CHEST PAIN 10/28/15  Yes [provider]  simvastatin (ZOCOR) 20 MG tablet Take 20 mg by mouth at bedtime. 01/03/15  Yes [provider]  spironolactone (ALDACTONE) 25 MG tablet Take 25 mg by mouth 2 (two) times daily. 01/03/15  Yes [provider]  triamcinolone cream (KENALOG) 0.5 % Apply 1 application topically daily as needed (for sun  exposure).   Yes [provider]  UNABLE TO FIND Med Name: Garlic and Honey (homemade regimen) - two cloves a day with tsp of honey   Yes [provider]    Allergies:   Allergies  Allergen Reactions  . Hydrocodone Nausea Only    Has problems with high doses    Social History:  reports that she has been smoking cigarettes. She has been smoking about 0.50 packs per day. She has never used smokeless tobacco. She reports that she does not drink alcohol and does not use drugs.  Family History: Family History  Problem Relation Age of Onset  . Heart disease Mother   . Stroke Mother   . Hypertension Mother   . Heart disease Father   . Cancer Father   .  Graves' disease Father   . Colon cancer Father        pt unsure of age onset, believes he was in his 57s  . Stroke Brother   . Hypertension Maternal Grandmother   . Heart attack Neg Hx   . Esophageal cancer Neg Hx   . Stomach cancer Neg Hx   . Rectal cancer Neg Hx      Objective   Physical Exam: Blood pressure 105/75, pulse 78, temperature 97.9 F (36.6 C), temperature source Oral, resp. rate 19, height 5\' 4"  (1.626 m), weight 95.9 kg, SpO2 98 %.  Physical Exam Vitals and nursing note reviewed.  Constitutional:      Appearance: Normal appearance.  HENT:     Head: Normocephalic and atraumatic.  Eyes:     Conjunctiva/sclera: Conjunctivae normal.  Cardiovascular:     Rate and Rhythm: Normal rate and regular rhythm.  Pulmonary:     Effort: Pulmonary effort is normal. No accessory muscle usage.     Breath sounds: Examination of the right-lower field reveals rales. Rales present.     Comments: Currently on 2 LPM Abdominal:     General: Abdomen is flat.     Palpations: Abdomen is soft.     Tenderness: There is no abdominal tenderness.  Musculoskeletal:        General: No swelling or tenderness.  Skin:    Coloration: Skin is not jaundiced or pale.  Neurological:     General: No focal deficit present.       Mental Status: She is alert. Mental status is at baseline.  Psychiatric:        Mood and Affect: Mood normal.        Behavior: Behavior normal.     LABS on Admission: I have personally reviewed all the labs and imaging below    Basic Metabolic Panel: Recent Labs  Lab 05/15/20 1650 05/16/20 0820  NA 132* 135  K 3.4* 3.6  CL 96* 100  CO2 23 24  GLUCOSE 93 117*  BUN 15 12  CREATININE 1.10* 0.98  CALCIUM 9.4 9.0   Liver Function Tests: Recent Labs  Lab 05/15/20 1650 05/16/20 0820  AST 32 23  ALT 23 20  ALKPHOS 48 43  BILITOT 0.5 0.7  PROT 7.9 7.1  ALBUMIN 3.8 3.4*   Recent Labs  Lab 05/15/20 1650  LIPASE 22   No results for input(s): AMMONIA in the last 168 hours. CBC: Recent Labs  Lab 05/15/20 1650 05/15/20 1650 05/16/20 0820  WBC 11.1*  --  8.8  NEUTROABS  --   --  5.7  HGB 14.5  --  13.4  HCT 41.9  --  39.1  MCV 86.9   < > 88.3  PLT 213  --  214   < > = values in this interval not displayed.   Cardiac Enzymes: No results for input(s): CKTOTAL, CKMB, CKMBINDEX, TROPONINI in the last 168 hours. BNP: Invalid input(s): POCBNP CBG: No results for input(s): GLUCAP in the last 168 hours.  Radiological Exams on Admission:  CT ABDOMEN PELVIS W CONTRAST  Result Date: 05/15/2020 CLINICAL DATA:  Left-sided abdominal pain EXAM: CT ABDOMEN AND PELVIS WITH CONTRAST TECHNIQUE: Multidetector CT imaging of the abdomen and pelvis was performed using the standard protocol following bolus administration of intravenous contrast. CONTRAST:  141mL OMNIPAQUE IOHEXOL 300 MG/ML  SOLN COMPARISON:  04/11/2019 FINDINGS: Lower chest: No acute abnormality. Hepatobiliary: Mild fatty infiltration of the liver is noted. The gallbladder is within normal limits.  Pancreas: Unremarkable. No pancreatic ductal dilatation or surrounding inflammatory changes. Spleen: Wedge shaped area of decreased attenuation is noted along the superior aspect of the spleen which may represent prior  infarct. Some volume loss in this region is noted which supports the possibility of underlying infarct. Adrenals/Urinary Tract: Adrenal glands are within normal limits. Right kidney demonstrates nonobstructing renal stone measuring 3 mm in the lower pole. Right ureter is within normal limits. The bladder is well distended. Left kidney demonstrates 2 patchy areas of decreased attenuation which persist on delayed images. Given the findings in the spleen these may also represent renal infarcts although focal pyelonephritis deserves consideration given the mild elevation of white blood cell count. Nonobstructing calculi are noted in the left kidney is well. The largest of these measures 10 mm in the lower pole. No obstructive changes are seen. Stomach/Bowel: Colon is predominately decompressed. No obstructive or inflammatory changes are noted. The appendix is within normal limits. No small bowel or gastric abnormality is seen. Vascular/Lymphatic: Aortic atherosclerosis. No enlarged abdominal or pelvic lymph nodes. Reproductive: Status post hysterectomy. No adnexal masses. Other: No abdominal wall hernia or abnormality. No abdominopelvic ascites. Musculoskeletal: No acute or significant osseous findings. IMPRESSION: Multifocal patchy areas of decreased enhancement within the left kidney with some associated inflammatory changes. These most likely represent areas of focal pyelonephritis although the possibility of renal infarct could not be totally excluded. Geographic area of decreased attenuation with some volume loss in the spleen. This also has the appearance of a focal infarct and is new from the prior exam. Bilateral nonobstructing renal stones larger on the left than the right. Electronically Signed   By: Inez Catalina M.D.   On: 05/15/2020 23:04   DG CHEST PORT 1 VIEW  Addendum Date: 05/16/2020   ADDENDUM REPORT: 05/16/2020 09:00 ADDENDUM: The inferior seventh ribs bilaterally are less sharply defined than  other ribs. This finding is probably due to portable technique. It may be prudent to correlate with rib detail views to further evaluate these areas. Electronically Signed   By: Lowella Grip III M.D.   On: 05/16/2020 09:00   Result Date: 05/16/2020 CLINICAL DATA:  Hypoxia EXAM: PORTABLE CHEST 1 VIEW COMPARISON:  August 05, 2018 FINDINGS: There is no edema or airspace opacity. Heart size and pulmonary vascularity are normal. No adenopathy. No bone lesions. IMPRESSION: No edema or airspace opacity.  Cardiac silhouette normal. Electronically Signed: By: Lowella Grip III M.D. On: 05/16/2020 08:33      EKG: Independently reviewed.  T wave inversions in V1 through V4 with abnormal R wave progression with early transition point as well as PVCs.   A & P   Principal Problem:   Splenic infarct Active Problems:   CAD (coronary artery disease), native coronary artery   Hypertension   Hypercholesterolemia   Tobacco abuse   Aortic atherosclerosis (HCC)   Hypothyroidism   Renal infarct (HCC)   Hypoxia   1. Acute splenic and left renal infarcts, possibly secondary to tobacco use in combination with estradiol a. Unlikely pyelonephritis with unremarkable UA and is afebrile with minimally elevated likely reactive WBC b. No known history of A. fib or clotting disorders and no known family history of clotting disorders c. Continue heparin drip d. Continue telemetry e. Strongly encouraged tobacco cessation  2. Abdominal pain, nausea and vomiting likely secondary to acute splenic and left renal infarcts, improved a. Continue Zofran b. Advance diet as tolerated  3. Abnormal Asymptomatic EKG with CAD history  a. EKG:  New T wave inversions in V1 through V4 with abnormal R wave progression with early transition point as well as PVCs. b. Will check a troponin c. Telemetry  4. Acute hypoxia with history of COPD, not in exacerbation a. Possibly from atelectasis with rales on exam b. CXR  overall unremarkable per radiology read however possible incidental posterior right 8th rib lesion on person read. Discussed with the radiologist who did not feel strongly about this finding but  recommended upright 2 view CXR to clarify. Of note, patient reports a history of cancer in her father c. Incentive spirometry d. Continue home albuterol  5. History of CAD s/p stents in the past  Aortic atherosclerosis  Hyperlipidemia  TIA a. Continue statin b. On high-dose aspirin.  Monitor for bleeding while on heparin  6. Hypertension a. Continue home Imdur, Lasix and spironolactone  7. Hypokalemia a. Continue spironolactone  8. Hyponatremia a. Received IV fluids in the ED. Follow-up labs from today  9. Thyrotoxicosis s/p thyroidectomy with acquired hypothyroidism a. Continue home Synthroid  10. Tobacco use a. Strongly encourage cessation  11. Chronic HFpEF a. Continue home meds    DVT prophylaxis: heparin drip   Code Status: DNR  Diet: heart healthy Family Communication: Admission, patients condition and plan of care including tests being ordered have been discussed with the patient who indicates understanding and agrees with the plan and Code Status.  Disposition Plan: The appropriate patient status for this patient is OBSERVATION. Observation status is judged to be reasonable and necessary in order to provide the required intensity of service to ensure the patient's safety. The patient's presenting symptoms, physical exam findings, and initial radiographic and laboratory data in the context of their medical condition is felt to place them at decreased risk for further clinical deterioration. Furthermore, it is anticipated that the patient will be medically stable for discharge from the hospital within 2 midnights of admission. The following factors support the patient status of observation.   " The patient's presenting symptoms include abdominal pain, nausea/vomiting. " The  physical exam findings include hypoxia, rales. " The initial radiographic and laboratory data are splenic and left renal infarct.    Status is: Observation  The patient remains OBS appropriate and will d/c before 2 midnights.  Dispo: The patient is from: Home              Anticipated d/c is to: Home              Anticipated d/c date is: 1 day              Patient currently is not medically stable to d/c.    Consultants  . None . Discussed with Dr. Jasmine December, radiology, over the phone  Procedures  . none  Time Spent on Admission: 70 minutes    Harold Hedge, DO [facility]  05/16/2020, 9:11 AM

## 2020-05-16 NOTE — Progress Notes (Signed)
ANTICOAGULATION CONSULT NOTE - Follow Up Consult  Pharmacy Consult for heparin Indication: renal and splenic infarcts  Allergies  Allergen Reactions  . Hydrocodone Nausea Only    Has problems with high doses    Patient Measurements: Height: 5\' 4"  (162.6 cm) Weight: 95.9 kg (211 lb 6.7 oz) IBW/kg (Calculated) : 54.7 Heparin Dosing Weight: 78 kg  Vital Signs: Temp: 98.1 F (36.7 C) (10/16 0407) Temp Source: Oral (10/16 0407) BP: 99/67 (10/16 0407) Pulse Rate: 84 (10/16 0407)  Labs: Recent Labs    05/15/20 1650  HGB 14.5  HCT 41.9  PLT 213  CREATININE 1.10*    Estimated Creatinine Clearance: 62.7 mL/min (A) (by C-G formula based on SCr of 1.1 mg/dL (H)).   Assessment: Patient's a 59 y.o F presented to the ED on 10/15 with c/o abdominal pain and n/v.  Abdominal CT showed findings with concern for splenic and renal infarct.  Heparin drip started on 10/16.  Today, 05/16/2020: - first heparin level is supra-therapeutic at 0.83 - cbc ok - per pt's RN, no bleeding noted  Goal of Therapy:  Heparin level 0.3-0.7 units/ml Monitor platelets by anticoagulation protocol: Yes   Plan:  - Decrease heparin drip to 1050 units/hr - check 6 hr heparin level - monitor for s/sx bleeding  Jocelyn [name] P 05/16/2020,8:13 AM

## 2020-05-17 DIAGNOSIS — E890 Postprocedural hypothyroidism: Secondary | ICD-10-CM | POA: Diagnosis present

## 2020-05-17 DIAGNOSIS — I5042 Chronic combined systolic (congestive) and diastolic (congestive) heart failure: Secondary | ICD-10-CM

## 2020-05-17 DIAGNOSIS — Z8249 Family history of ischemic heart disease and other diseases of the circulatory system: Secondary | ICD-10-CM | POA: Diagnosis not present

## 2020-05-17 DIAGNOSIS — E871 Hypo-osmolality and hyponatremia: Secondary | ICD-10-CM | POA: Diagnosis present

## 2020-05-17 DIAGNOSIS — I25119 Atherosclerotic heart disease of native coronary artery with unspecified angina pectoris: Secondary | ICD-10-CM | POA: Diagnosis not present

## 2020-05-17 DIAGNOSIS — E7800 Pure hypercholesterolemia, unspecified: Secondary | ICD-10-CM | POA: Diagnosis present

## 2020-05-17 DIAGNOSIS — D735 Infarction of spleen: Secondary | ICD-10-CM | POA: Diagnosis not present

## 2020-05-17 DIAGNOSIS — N280 Ischemia and infarction of kidney: Secondary | ICD-10-CM | POA: Diagnosis present

## 2020-05-17 DIAGNOSIS — F17210 Nicotine dependence, cigarettes, uncomplicated: Secondary | ICD-10-CM | POA: Diagnosis present

## 2020-05-17 DIAGNOSIS — R0902 Hypoxemia: Secondary | ICD-10-CM | POA: Diagnosis present

## 2020-05-17 DIAGNOSIS — I700 Atherosclerosis of aorta: Secondary | ICD-10-CM | POA: Diagnosis present

## 2020-05-17 DIAGNOSIS — N182 Chronic kidney disease, stage 2 (mild): Secondary | ICD-10-CM | POA: Diagnosis present

## 2020-05-17 DIAGNOSIS — Z8673 Personal history of transient ischemic attack (TIA), and cerebral infarction without residual deficits: Secondary | ICD-10-CM | POA: Diagnosis not present

## 2020-05-17 DIAGNOSIS — E876 Hypokalemia: Secondary | ICD-10-CM | POA: Diagnosis present

## 2020-05-17 DIAGNOSIS — Z66 Do not resuscitate: Secondary | ICD-10-CM | POA: Diagnosis present

## 2020-05-17 DIAGNOSIS — I513 Intracardiac thrombosis, not elsewhere classified: Secondary | ICD-10-CM

## 2020-05-17 DIAGNOSIS — I130 Hypertensive heart and chronic kidney disease with heart failure and stage 1 through stage 4 chronic kidney disease, or unspecified chronic kidney disease: Secondary | ICD-10-CM | POA: Diagnosis present

## 2020-05-17 DIAGNOSIS — I2510 Atherosclerotic heart disease of native coronary artery without angina pectoris: Secondary | ICD-10-CM

## 2020-05-17 DIAGNOSIS — Z20822 Contact with and (suspected) exposure to covid-19: Secondary | ICD-10-CM | POA: Diagnosis present

## 2020-05-17 DIAGNOSIS — F32A Depression, unspecified: Secondary | ICD-10-CM | POA: Diagnosis present

## 2020-05-17 DIAGNOSIS — F419 Anxiety disorder, unspecified: Secondary | ICD-10-CM | POA: Diagnosis present

## 2020-05-17 DIAGNOSIS — I252 Old myocardial infarction: Secondary | ICD-10-CM | POA: Diagnosis not present

## 2020-05-17 DIAGNOSIS — J449 Chronic obstructive pulmonary disease, unspecified: Secondary | ICD-10-CM | POA: Diagnosis present

## 2020-05-17 DIAGNOSIS — E785 Hyperlipidemia, unspecified: Secondary | ICD-10-CM | POA: Diagnosis present

## 2020-05-17 DIAGNOSIS — Z955 Presence of coronary angioplasty implant and graft: Secondary | ICD-10-CM | POA: Diagnosis not present

## 2020-05-17 LAB — PROTIME-INR
INR: 1.2 (ref 0.8–1.2)
Prothrombin Time: 14.6 seconds (ref 11.4–15.2)

## 2020-05-17 LAB — BASIC METABOLIC PANEL
Anion gap: 13 (ref 5–15)
BUN: 12 mg/dL (ref 6–20)
CO2: 24 mmol/L (ref 22–32)
Calcium: 9.1 mg/dL (ref 8.9–10.3)
Chloride: 98 mmol/L (ref 98–111)
Creatinine, Ser: 1.02 mg/dL — ABNORMAL HIGH (ref 0.44–1.00)
GFR, Estimated: >60 mL/min (ref >60)
Glucose, Bld: 107 mg/dL — ABNORMAL HIGH (ref 70–99)
Potassium: 3.6 mmol/L (ref 3.5–5.1)
Sodium: 135 mmol/L (ref 135–145)

## 2020-05-17 LAB — CBC
HCT: 38.8 % (ref 36.0–46.0)
Hemoglobin: 13.3 g/dL (ref 12.0–15.0)
MCH: 30.4 pg (ref 26.0–34.0)
MCHC: 34.3 g/dL (ref 30.0–36.0)
MCV: 88.8 fL (ref 80.0–100.0)
Platelets: 226 10*3/uL (ref 150–400)
RBC: 4.37 MIL/uL (ref 3.87–5.11)
RDW: 12.9 % (ref 11.5–15.5)
WBC: 6.6 10*3/uL (ref 4.0–10.5)
nRBC: 0 % (ref 0.0–0.2)

## 2020-05-17 LAB — HEPATIC FUNCTION PANEL
ALT: 18 U/L (ref 0–44)
AST: 22 U/L (ref 15–41)
Albumin: 3.1 g/dL — ABNORMAL LOW (ref 3.5–5.0)
Alkaline Phosphatase: 43 U/L (ref 38–126)
Bilirubin, Direct: 0.1 mg/dL (ref 0.0–0.2)
Indirect Bilirubin: 0.6 mg/dL (ref 0.3–0.9)
Total Bilirubin: 0.7 mg/dL (ref 0.3–1.2)
Total Protein: 6.6 g/dL (ref 6.5–8.1)

## 2020-05-17 LAB — HEPARIN LEVEL (UNFRACTIONATED): Heparin Unfractionated: 0.41 IU/mL (ref 0.30–0.70)

## 2020-05-17 LAB — MAGNESIUM: Magnesium: 1.8 mg/dL (ref 1.7–2.4)

## 2020-05-17 MED ORDER — WARFARIN - PHARMACIST DOSING INPATIENT: Freq: Every day | Status: DC

## 2020-05-17 MED ORDER — ROSUVASTATIN CALCIUM 20 MG PO TABS
20.0000 mg | ORAL_TABLET | Freq: Every day | ORAL | Status: DC
  Administered 2020-05-17 – 2020-05-21 (×5): 20 mg via ORAL
  Filled 2020-05-17 (×5): qty 1

## 2020-05-17 MED ORDER — ENSURE MAX PROTEIN PO LIQD
11.0000 [oz_av] | Freq: Two times a day (BID) | ORAL | Status: DC
  Administered 2020-05-17 – 2020-05-21 (×5): 11 [oz_av] via ORAL
  Filled 2020-05-17 (×9): qty 330

## 2020-05-17 MED ORDER — WARFARIN SODIUM 5 MG PO TABS
10.0000 mg | ORAL_TABLET | Freq: Once | ORAL | Status: AC
  Administered 2020-05-17: 10 mg via ORAL
  Filled 2020-05-17: qty 2

## 2020-05-17 MED ORDER — METOPROLOL SUCCINATE ER 25 MG PO TB24
12.5000 mg | ORAL_TABLET | Freq: Every day | ORAL | Status: DC
  Administered 2020-05-17 – 2020-05-21 (×4): 12.5 mg via ORAL
  Filled 2020-05-17 (×5): qty 1

## 2020-05-17 MED ORDER — ASPIRIN EC 81 MG PO TBEC: 81.0000 mg | DELAYED_RELEASE_TABLET | Freq: Every day | ORAL | Status: DC

## 2020-05-17 NOTE — Consult Note (Signed)
Cardiology Consultation:   Patient ID: Jocelyn [name] MRN: [id number]; DOB: 1961/01/09  Admit date: 05/15/2020 Date of Consult: 05/17/2020  Primary Care Provider: Benito Mccreedy, MD [facility] Cardiologist: Larae Grooms, MD  [facility] Electrophysiologist:  None    Patient Profile:   Jocelyn [name] is a 59 y.o. female with a hx of CAD c/b MI s/p PCI, TIA, HTN, HLD, CKD, and HFpEF who is being seen today for the evaluation of newly diagnosed LV thrombus and HF with reduced EF 40-45% at the request of Dr. Kyung Bacca.  History of Present Illness:   Jocelyn [name] is a 59 year old female with history of HFpEF, CKD, CAD with MI s/p PCI, tobacco abuse, TIA, depression and HLD who presented to the ED with abdominal pain, nausea and vomiting. CT abdomen/pelvis revealed left renal and splenic infarcts with concern for cardioembolic source. TTE with contrast revealed mildly depressed LVEF 40-45%, difficult to assess regional WMA due to limited windows, and LV apical thrombus measuring 1.4x1cm. Given echo findings, Cardiology is now consulted for further management.  The patient states that she has a history of 3 previous myocardial infarctions managed in [place]. Last cath here in 2017 showed occlusion of previously placed OM1 stent but no other significant lesions. She has been clinically stable with no chest pain/pressure with exertion, SOB or decreased exercise capacity. She is no longer taking her statin or blood pressure medications as she has proceeded with a holistic approach and says that this method has controlled her cholesterol and blood pressure such that she was weaned off the medications. She denies any orthopnea, PND, or LE edema. Has no known history of clotting disorders, hypercoaguable states, or DVT/PE.  Of note, she is a current smoker and is on HRT for menopause.   Past Medical History:  Diagnosis Date  . Anxiety   . Arthritis   . Chronic diastolic CHF  (congestive heart failure), NYHA class 2 ([place])   . Chronic kidney disease    kidney stones hx  . COPD (chronic obstructive pulmonary disease) ([place])   . Depression   . HTN (hypertension)   . Hypercholesterolemia   . Hypothyroidism (acquired)   . Myocardial infarction [facility])    x3- pt unsure if MIs were in 1996 or 1999, had stent fixed in 2016 but no MI at that time per pt  . Thyrotoxicosis   . TIA (transient ischemic attack)    2013    Past Surgical History:  Procedure Laterality Date  . BREAST CYST EXCISION    . CARDIAC CATHETERIZATION N/A 08/24/2015   Procedure: Left Heart Cath and Coronary Angiography;  Surgeon: Jettie Booze, MD;  Location: [place] [facility];  Service: Cardiovascular;  Laterality: N/A;  . COLONOSCOPY    . [place]   and then had procedure to repair in 2016  . KNEE SURGERY    . partial hysterec    . THYROIDECTOMY       Home Medications:  Prior to Admission medications   Medication Sig Start Date End Date Taking? Authorizing Provider  albuterol (PROVENTIL HFA;VENTOLIN HFA) 108 (90 Base) MCG/ACT inhaler Inhale 2 puffs into the lungs every 4 (four) hours as needed for shortness of breath.  06/21/15 05/16/20 Yes [provider]  ALPRAZolam [name]) 1 MG tablet Take 1 tablet by mouth 3 (three) times daily as needed for anxiety. 05/10/20  Yes [provider]  aspirin 325 MG tablet Take 325 mg by mouth daily.  Yes [provider]  cyclobenzaprine (FLEXERIL) 10 MG tablet Take 1 tablet (10 mg total) by mouth 3 (three) times daily as needed for muscle spasms. 03/29/17  Yes [name], NP  docusate sodium (COLACE) 100 MG capsule Take 100 mg by mouth 2 (two) times daily as needed for constipation. 12/19/19  Yes [provider]  estradiol (ESTRACE) 0.5 MG tablet Take 0.5 mg by mouth daily. 08/15/17  Yes [provider]  furosemide (LASIX) 40 MG tablet Take 40 mg by mouth daily.    Yes [provider]  gabapentin (NEURONTIN) 300 MG capsule Take 300 mg by mouth daily. 03/11/20  Yes [provider]  isosorbide mononitrate (IMDUR) 30 MG 24 hr tablet Take 2 tablets (60 mg total) by mouth daily. 07/17/16  Yes [name], MD  levothyroxine (SYNTHROID) 112 MCG tablet Take 1 tablet (112 mcg total) by mouth every morning. 12/29/19  Yes [name], MD  LINZESS 145 MCG CAPS capsule Take 145 mcg by mouth daily. 04/30/20  Yes [provider]  metroNIDAZOLE (FLAGYL) 500 MG tablet Take 500 mg by mouth 3 (three) times daily. 05/12/20  Yes [provider]  nitroGLYCERIN (NITROSTAT) 0.3 MG SL tablet TAKE 1 TABLET UNDER THE TONGUE EVERY 5 MINUTES AS NEEDED FOR CHEST PAIN 10/28/15  Yes [provider]  simvastatin (ZOCOR) 20 MG tablet Take 20 mg by mouth at bedtime. 01/03/15  Yes [provider]  spironolactone (ALDACTONE) 25 MG tablet Take 25 mg by mouth 2 (two) times daily. 01/03/15  Yes [provider]  triamcinolone cream (KENALOG) 0.5 % Apply 1 application topically daily as needed (for sun exposure).   Yes [provider]  UNABLE TO FIND Med Name: Garlic and Honey (homemade regimen) - two cloves a day with tsp of honey   Yes [provider]    Inpatient Medications: Scheduled Meds: . aspirin  325 mg Oral Daily  . furosemide  40 mg Oral Daily  . gabapentin  300 mg Oral Daily  . isosorbide mononitrate  60 mg Oral Daily  . levothyroxine  112 mcg Oral q morning - 10a  . linaclotide  145 mcg Oral Daily  . sodium chloride flush  3 mL Intravenous Q12H   Continuous Infusions: . heparin 1,050 Units/hr (05/16/20 2001)   PRN Meds: acetaminophen **OR** acetaminophen, albuterol, cyclobenzaprine, docusate sodium, HYDROcodone-acetaminophen, metoprolol tartrate, morphine injection  Allergies:    Allergies  Allergen Reactions  . Hydrocodone Nausea Only    Has problems with high doses    Social History:   Social History    Socioeconomic History  . Marital status: Legally Separated    Spouse name: Not on file  . Number of children: 2  . Years of education: Not on file  . Highest education level: Not on file  Occupational History  . Occupation: [employer]  Tobacco Use  . Smoking status: Current Every Day Smoker    Packs/day: 0.50    Types: Cigarettes  . Smokeless tobacco: Never Used  Vaping Use  . Vaping Use: Never used  Substance and Sexual Activity  . Alcohol use: No    Alcohol/week: 0.0 standard drinks  . Drug use: No  . Sexual activity: Not on file  Other Topics Concern  . Not on file  Social History Narrative  . Not on file   Social Determinants of Health   Financial Resource Strain:   . Difficulty of Paying Living Expenses: Not on file  Food Insecurity:   .  Worried About Charity fundraiser in the Last Year: Not on file  . Ran Out of Food in the Last Year: Not on file  Transportation Needs:   . Lack of Transportation (Medical): Not on file  . Lack of Transportation (Non-Medical): Not on file  Physical Activity:   . Days of Exercise per Week: Not on file  . Minutes of Exercise per Session: Not on file  Stress:   . Feeling of Stress : Not on file  Social Connections:   . Frequency of Communication with Friends and Family: Not on file  . Frequency of Social Gatherings with Friends and Family: Not on file  . Attends Religious Services: Not on file  . Active Member of Clubs or Organizations: Not on file  . Attends Archivist Meetings: Not on file  . Marital Status: Not on file  Intimate Partner Violence:   . Fear of Current or Ex-Partner: Not on file  . Emotionally Abused: Not on file  . Physically Abused: Not on file  . Sexually Abused: Not on file    Family History:    Family History  Problem Relation Age of Onset  . Heart disease Mother   . Stroke Mother   . Hypertension Mother   . Heart disease Father   . Cancer Father   . Graves' disease Father   . Colon cancer  Father        pt unsure of age onset, believes he was in his 73s  . Stroke Brother   . Hypertension Maternal Grandmother   . Heart attack Neg Hx   . Esophageal cancer Neg Hx   . Stomach cancer Neg Hx   . Rectal cancer Neg Hx      ROS:  Please see the history of present illness.   The patient denies chest pain, chest pressure, dyspnea at rest or with exertion, palpitations, PND, orthopnea, or leg swelling. Denies cough, fever, chills. Denies syncope or presyncope. Denies dizziness or lightheadedness. Denies snoring.  Physical Exam/Data:   Vitals:   05/16/20 1647 05/16/20 1649 05/16/20 1953 05/17/20 0618  BP:  100/65 106/72 100/60  Pulse: 66 77 83 74  Resp:  16 16   Temp:  98.2 F (36.8 C) 97.9 F (36.6 C) 98.4 F (36.9 C)  TempSrc:  Oral Oral Oral  SpO2: 91% 96% 95% 100%  Weight:      Height:        Intake/Output Summary (Last 24 hours) at 05/17/2020 0903 Last data filed at 05/16/2020 2011 Gross per 24 hour  Intake 944 ml  Output --  Net 944 ml   Last 3 Weights 05/16/2020 05/15/2020 05/15/2020  Weight (lbs) 211 lb 6.7 oz 207 lb 223 lb 1.7 oz  Weight (kg) 95.9 kg 93.895 kg 101.2 kg     Body mass index is 36.29 kg/m.  General:  Well nourished, well developed, in no acute distress HEENT: normal Neck: no JVD Vascular: No carotid bruits Cardiac:  normal S1, S2; RRR; no murmur  Lungs:  clear to auscultation bilaterally, no wheezing, rhonchi or rales  Abd: soft, nontender, no hepatomegaly  Ext: no edema Musculoskeletal:  No deformities, BUE and BLE strength normal and equal Skin: warm and dry  Neuro:  CNs 2-12 intact, no focal abnormalities noted Psych:  Normal affect   EKG:  The EKG was personally reviewed and demonstrates:  Sinus rhythm, early r-wave progression, isolated PVC Telemetry:  Telemetry was personally reviewed and demonstrates:  Sinus rhythm with  occasional PVCs  Relevant CV Studies: Cath 08/24/2015:  1st Mrg lesion, 100% in stent restenosis. The  lesion was previously treated with a stent (unknown type).  The left ventricular systolic function is normal.  No other obstructive disease.  Ramus Intermedius  . Vessel is small.  Left Circumflex  . Vessel is small.  First Obtuse Marginal Branch  Collaterals  1st Mrg filled by collaterals from Dist LAD.    The lesion is type C Chronic total occlusionand has left-to-left collateral flow. The lesion was previously treated with a stent (unknown type).      Continue medical therapy.  Noncardiac causes of her chest pain should be considered.  Cardiology f/u with Dr. Martinique.  TTE 05/16/20: IMPRESSIONS  1. LV apical thrombus 1.4 x 1 cm.  2. Very limited image quality due to suboptimal acoustic windows.  3. Left ventricular ejection fraction, by estimation, is 40 to 45%. The  left ventricle has mildly decreased function. Left ventricular endocardial  border not optimally defined to evaluate regional wall motion. There is  mild left ventricular hypertrophy.  Left ventricular diastolic parameters are consistent with Grade I  diastolic dysfunction (impaired relaxation).  4. Right ventricular systolic function is normal. The right ventricular  size is not well visualized.  5. The mitral valve is grossly normal. No evidence of mitral valve  regurgitation.  6. The aortic valve was not well visualized. Aortic valve regurgitation  is not visualized. No aortic stenosis is present.  7. The inferior vena cava is normal in size with greater than 50%  respiratory variability, suggesting right atrial pressure of 3 mmHg.   CT abdomen/pelvis 05/15/20: IMPRESSION: Multifocal patchy areas of decreased enhancement within the left kidney with some associated inflammatory changes. These most likely represent areas of focal pyelonephritis although the possibility of renal infarct could not be totally excluded.  Geographic area of decreased attenuation with some volume loss in the spleen.  This also has the appearance of a focal infarct and is new from the prior exam.  Bilateral nonobstructing renal stones larger on the left than the right.  TTE 2016: Study Conclusions  - Left ventricle: The cavity size was normal. Wall thickness was  normal. Systolic function was normal. The estimated ejection  fraction was in the range of 55% to 60%. Wall motion was normal;  there were no regional wall motion abnormalities. Doppler  parameters are consistent with abnormal left ventricular  relaxation (grade 1 diastolic dysfunction). The E/e&' ratio is <8,  suggesting normal LV filling pressure.  - Left atrium: The atrium was normal in size.   Impressions:  - LVEF 55-60%, normal wall thickness, normal wall motion, diastolic  dysfunction, normal LV filling pressure, normal LA size.   Laboratory Data:  High Sensitivity Troponin:   Recent Labs  Lab 05/16/20 0820 05/16/20 1058  TROPONINIHS 113* 123*     Chemistry Recent Labs  Lab 05/15/20 1650 05/16/20 0820  NA 132* 135  K 3.4* 3.6  CL 96* 100  CO2 23 24  GLUCOSE 93 117*  BUN 15 12  CREATININE 1.10* 0.98  CALCIUM 9.4 9.0  GFRNONAA 55* >60  ANIONGAP 13 11    Recent Labs  Lab 05/15/20 1650 05/16/20 0820  PROT 7.9 7.1  ALBUMIN 3.8 3.4*  AST 32 23  ALT 23 20  ALKPHOS 48 43  BILITOT 0.5 0.7   Hematology Recent Labs  Lab 05/15/20 1650 05/16/20 0820  WBC 11.1* 8.8  RBC 4.82 4.43  HGB 14.5 13.4  HCT 41.9 39.1  MCV 86.9 88.3  MCH 30.1 30.2  MCHC 34.6 34.3  RDW 13.0 13.2  PLT 213 214   BNPNo results for input(s): BNP, PROBNP in the last 168 hours.  DDimer No results for input(s): DDIMER in the last 168 hours.   Radiology/Studies:  DG Ribs Unilateral W/Chest Right  Result Date: 05/16/2020 CLINICAL DATA:  Evaluate possible rib lesion demonstrated on portable chest radiograph from earlier this morning. EXAM: RIGHT RIBS AND CHEST - 3+ VIEW COMPARISON:  Chest radiograph-earlier same day  FINDINGS: Unchanged cardiac silhouette and mediastinal contours. Minimal bilateral infrahilar heterogeneous opacities favored to represent atelectasis, right slightly greater than left. No new focal airspace opacities. No pleural effusion or pneumothorax. No evidence of edema. No displaced right-sided rib fractures. No discrete osseous lesions. Mild degenerative change the right AC joint is incidentally noted. 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 Regional soft tissues appear normal. IMPRESSION: 1. No acute cardiopulmonary disease. 2. No displaced right-sided rib fractures or discrete osseous lesions. Electronically Signed   By: Sandi Mariscal M.D.   On: 05/16/2020 10:49   CT ABDOMEN PELVIS W CONTRAST  Result Date: 05/15/2020 CLINICAL DATA:  Left-sided abdominal pain EXAM: CT ABDOMEN AND PELVIS WITH CONTRAST TECHNIQUE: Multidetector CT imaging of the abdomen and pelvis was performed using the standard protocol following bolus administration of intravenous contrast. CONTRAST:  165mL OMNIPAQUE IOHEXOL 300 MG/ML  SOLN COMPARISON:  04/11/2019 FINDINGS: Lower chest: No acute abnormality. Hepatobiliary: Mild fatty infiltration of the liver is noted. The gallbladder is within normal limits. Pancreas: Unremarkable. No pancreatic ductal dilatation or surrounding inflammatory changes. Spleen: Wedge shaped area of decreased attenuation is noted along the superior aspect of the spleen which may represent prior infarct. Some volume loss in this region is noted which supports the possibility of underlying infarct. Adrenals/Urinary Tract: Adrenal glands are within normal limits. Right kidney demonstrates nonobstructing renal stone measuring 3 mm in the lower pole. Right ureter is within normal limits. The bladder is well distended. Left kidney demonstrates 2 patchy areas of decreased attenuation which persist on delayed images. Given the findings in the spleen these may also represent renal infarcts although focal  pyelonephritis deserves consideration given the mild elevation of white blood cell count. Nonobstructing calculi are noted in the left kidney is well. The largest of these measures 10 mm in the lower pole. No obstructive changes are seen. Stomach/Bowel: Colon is predominately decompressed. No obstructive or inflammatory changes are noted. The appendix is within normal limits. No small bowel or gastric abnormality is seen. Vascular/Lymphatic: Aortic atherosclerosis. No enlarged abdominal or pelvic lymph nodes. Reproductive: Status post hysterectomy. No adnexal masses. Other: No abdominal wall hernia or abnormality. No abdominopelvic ascites. Musculoskeletal: No acute or significant osseous findings. IMPRESSION: Multifocal patchy areas of decreased enhancement within the left kidney with some associated inflammatory changes. These most likely represent areas of focal pyelonephritis although the possibility of renal infarct could not be totally excluded. Geographic area of decreased attenuation with some volume loss in the spleen. This also has the appearance of a focal infarct and is new from the prior exam. Bilateral nonobstructing renal stones larger on the left than the right. Electronically Signed   By: Inez Catalina M.D.   On: 05/15/2020 23:04   DG CHEST PORT 1 VIEW  Addendum Date: 05/16/2020   ADDENDUM REPORT: 05/16/2020 09:00 ADDENDUM: The inferior seventh ribs bilaterally are less sharply defined than other ribs. This finding is probably due to portable technique. It may be  prudent to correlate with rib detail views to further evaluate these areas. Electronically Signed   By: Lowella Grip III M.D.   On: 05/16/2020 09:00   Result Date: 05/16/2020 CLINICAL DATA:  Hypoxia EXAM: PORTABLE CHEST 1 VIEW COMPARISON:  August 05, 2018 FINDINGS: There is no edema or airspace opacity. Heart size and pulmonary vascularity are normal. No adenopathy. No bone lesions. IMPRESSION: No edema or airspace opacity.   Cardiac silhouette normal. Electronically Signed: By: Lowella Grip III M.D. On: 05/16/2020 08:33   ECHOCARDIOGRAM COMPLETE  Result Date: 05/16/2020    ECHOCARDIOGRAM REPORT   Patient Name:   PEOLA JOYNT Hink Date of Exam: 05/16/2020 Medical Rec #:  [id number]      Height:       64.0 in Accession #:    [id number]     Weight:       211.4 lb Date of Birth:  1961-04-18     BSA:          2.003 m Patient Age:    29 years       BP:           105/75 mmHg Patient Gender: F              HR:           81 bpm. Exam Location:  Inpatient Procedure: 2D Echo, Cardiac Doppler, Color Doppler and Intracardiac            Opacification Agent Indications:    R94.31 Abnormal EKG; Elevated Troponin.  History:        Patient has prior history of Echocardiogram examinations, most                 recent 02/09/2015. Previous Myocardial Infarction, COPD and TIA;                 Risk Factors:Hypertension and Dyslipidemia. CKD. Hypothyroidism.  Sonographer:    Tiffany Dance Referring Phys: [id number] Harold Hedge  Sonographer Comments: Technically difficult study due to poor echo windows. IMPRESSIONS  1. LV apical thrombus 1.4 x 1 cm.  2. Very limited image quality due to suboptimal acoustic windows.  3. Left ventricular ejection fraction, by estimation, is 40 to 45%. The left ventricle has mildly decreased function. Left ventricular endocardial border not optimally defined to evaluate regional wall motion. There is mild left ventricular hypertrophy.  Left ventricular diastolic parameters are consistent with Grade I diastolic dysfunction (impaired relaxation).  4. Right ventricular systolic function is normal. The right ventricular size is not well visualized.  5. The mitral valve is grossly normal. No evidence of mitral valve regurgitation.  6. The aortic valve was not well visualized. Aortic valve regurgitation is not visualized. No aortic stenosis is present.  7. The inferior vena cava is normal in size with greater than 50% respiratory  variability, suggesting right atrial pressure of 3 mmHg. Conclusion(s)/Recommendation(s): Critical findings reported to Dr. Neysa Bonito and acknowledged at 1530 05/16/20. FINDINGS  Left Ventricle: Left ventricular ejection fraction, by estimation, is 40 to 45%. The left ventricle has mildly decreased function. Left ventricular endocardial border not optimally defined to evaluate regional wall motion. Definity contrast agent was given IV to delineate the left ventricular endocardial borders. The left ventricular internal cavity size was normal in size. There is mild left ventricular hypertrophy. Left ventricular diastolic parameters are consistent with Grade I diastolic dysfunction (impaired relaxation). Right Ventricle: The right ventricular size is not well visualized. Right vetricular wall thickness was not assessed.  Right ventricular systolic function is normal. Left Atrium: Left atrial size was normal in size. Right Atrium: Right atrial size was normal in size. Pericardium: There is no evidence of pericardial effusion. Mitral Valve: The mitral valve is grossly normal. No evidence of mitral valve regurgitation. Tricuspid Valve: The tricuspid valve is not well visualized. Tricuspid valve regurgitation is not demonstrated. Aortic Valve: The aortic valve was not well visualized. Aortic valve regurgitation is not visualized. No aortic stenosis is present. Pulmonic Valve: The pulmonic valve was not well visualized. Aorta: The aortic root and ascending aorta are structurally normal, with no evidence of dilitation. Venous: The inferior vena cava is normal in size with greater than 50% respiratory variability, suggesting right atrial pressure of 3 mmHg. IAS/Shunts: No atrial level shunt detected by color flow Doppler.  LEFT VENTRICLE PLAX 2D LVIDd:         3.90 cm  Diastology LVIDs:         2.40 cm  LV e' medial:    4.90 cm/s LV PW:         1.40 cm  LV E/e' medial:  7.8 LV IVS:        1.10 cm  LV e' lateral:   6.74 cm/s LVOT  diam:     1.70 cm  LV E/e' lateral: 5.7 LV SV:         32 LV SV Index:   16 LVOT Area:     2.27 cm  RIGHT VENTRICLE             IVC RV Basal diam:  2.80 cm     IVC diam: 1.50 cm RV S prime:     10.60 cm/s TAPSE (M-mode): 1.5 cm LEFT ATRIUM           Index       RIGHT ATRIUM           Index LA diam:      3.80 cm 1.90 cm/m  RA Area:     13.20 cm LA Vol (A2C): 53.8 ml 26.86 ml/m RA Volume:   28.80 ml  14.38 ml/m LA Vol (A4C): 17.2 ml 8.59 ml/m  AORTIC VALVE LVOT Vmax:   77.70 cm/s LVOT Vmean:  50.400 cm/s LVOT VTI:    0.142 m  AORTA Ao Root diam: 2.90 cm Ao Asc diam:  3.00 cm MITRAL VALVE MV Area (PHT): 2.13 cm    SHUNTS MV Decel Time: 356 msec    Systemic VTI:  0.14 m MV E velocity: 38.10 cm/s  Systemic Diam: 1.70 cm MV A velocity: 74.10 cm/s MV E/A ratio:  0.51 Cherlynn Kaiser MD Electronically signed by Cherlynn Kaiser MD Signature Date/Time: 05/16/2020/3:42:15 PM    Final      Assessment and Plan:   #LV thrombus: Patient initially presented with abdominal pain, nausea and vomiting found to have renal and splenic infarcts on CT with concern with cardioembolic source. Subsequent TTE revealed LV thrombus and mildly reduced LVEF. Apex does not appear to be akinetic based on limited views, however, patient has risk factors for clot formation including HRT and tobacco abuse.  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 Notably with history of CAD  s/p PCI in the past with cath 2017 revealing occluded previously placed stent but otherwise no significant disease. Has been told that pumping function is reduced in the past after MI in [place]. Appears euvolemic  currently. -Start metoprolol 12.5mg  XL -Would benefit from ACE/ARB although limited due to blood pressure; will trend and start low dose if able -Continue lasix 40mg  PO; goal net even as appears euvolemic -Monitor I/Os and daily weights  #Coronary artery disease s/p PCI: Occluded OM1 stent on cath in 2017. No other significant CAD. -Stop aspirin given need for warfarin  -Continue imdur -Start metop 12.5mg  XL -Start crestor 20mg  -Will add ACE/ARB as tolerated pending BP  #Prior TIA: -Hold ASA and start warfarin as above -Start crestor 20mg  daily  #HTN: Well controlled this AM 100/60s. -Continue imdur -Start metop XL  #HLD: -Start crestor as above      New York Heart Association (NYHA) Functional Class NYHA Class I    For questions or updates, please contact [facility] Please consult [email or web address] for contact info under    Signed, Freada Bergeron, MD  05/17/2020 9:03 AM

## 2020-05-17 NOTE — Progress Notes (Signed)
[facility] for heparin/warfarin Indication: renal and splenic infarcts + LV thrombus  Allergies  Allergen Reactions  . Hydrocodone Nausea Only    Has problems with high doses    Patient Measurements: Height: 5\' 4"  (162.6 cm) Weight: 95.9 kg (211 lb 6.7 oz) IBW/kg (Calculated) : 54.7 Heparin Dosing Weight: 78 kg  Vital Signs: Temp: 98.4 F (36.9 C) (10/17 0618) Temp Source: Oral (10/17 0618) BP: 100/60 (10/17 0618) Pulse Rate: 74 (10/17 0618)  Labs: Recent Labs    05/15/20 1650 05/15/20 1650 05/16/20 0820 05/16/20 1058 05/16/20 1556 05/16/20 2233 05/17/20 0708  HGB 14.5   < > 13.4  --   --   --  13.3  HCT 41.9  --  39.1  --   --   --  38.8  PLT 213  --  214  --   --   --  226  HEPARINUNFRC  --   --  0.83*  --  0.60 0.58  --   CREATININE 1.10*  --  0.98  --   --   --   --   TROPONINIHS  --   --  113* 123*  --   --   --    < > = values in this interval not displayed.    Estimated Creatinine Clearance: 70.3 mL/min (by C-G formula based on SCr of 0.98 mg/dL).   Assessment: Patient's a 59 y.o F presented to the ED on 10/15 with c/o abdominal pain and n/v.  Abdominal CT showed findings with concern for splenic and renal infarct.  Heparin drip started on 10/16. In addition, TTE revealed LV thrombus. Cardiology evaluating patient. Pharmacy consulted to initiate warfarin.   Today, 05/17/2020: - Heparin level remains therapeutic at 0.58 on heparin infusion at 1050 units/hr - No bleeding or complications reported  Goal of Therapy:  INR 2-3 Heparin level 0.3-0.7 units/ml Monitor platelets by anticoagulation protocol: Yes   Plan:  - Continue heparin drip at 1050 units/hr - Warfarin 10 mg once today - Baseline PT/INR - Continue heparin drip until INR is therapeutic and patient has received > or = 5 days of warfarin - Daily heparin level, PT/INR, & CBC while on heparin - monitor for s/sx bleeding  Ulice Dash, PharmD,  BCPS [phone] 05/17/2020,10:26 AM

## 2020-05-17 NOTE — Progress Notes (Signed)
Initial Nutrition Assessment  RD working remotely.  DOCUMENTATION CODES:   Obesity unspecified  INTERVENTION:   - Ensure Max po BID, each supplement provides 150 kcal and 30 grams of protein  NUTRITION DIAGNOSIS:   Increased nutrient needs related to chronic illness (CHF, COPD) as evidenced by estimated needs.  GOAL:   Patient will meet greater than or equal to 90% of their needs  MONITOR:   PO intake, Supplement acceptance, Labs, Weight trends, I & O's  REASON FOR ASSESSMENT:   Malnutrition Screening Tool    ASSESSMENT:   59 year old female who presented to the ED on 10/15 with abdominal pain. PMH of CHF, CKD, COPD, depression, HTN, HLD, hypothyroidism s/p thyroidectomy, MI, CAD s/p stent placement, tobacco use, thyrotoxicosis, TIA.   Per review of notes, pt reports having N/V/D about 30 minutes after eating food on Monday. Pt was started on Flagyl.  Pt admitted with acute splenic and left renal infarcts. TTE revealing LV thrombus and mildly reduced LVEF.  Spoke with pt via phone call to room. Pt requesting RD order lunch meal tray and wanted to know options on Heart Healthy diet. Lunch meal tray ordered via [place].  Pt reports appetite is slowly improving. She states that for the last week since getting food poisoning, she has not really eaten any solid food. She has been able to drink water, Propel, and Gatorade. Pt endorses weight loss during this time and reports UBW as 220 lbs. She states that she lost down to 211 lbs at the doctor's office last week and now is down to 207 lbs.  Current weight is 211 lbs as of 05/16/20. Reviewed weight history in chart. Pt with a 5.4 kg weight loss since 12/27/19. This is a 5.2% weight loss in 5 months which is not significant for timeframe.  RD will order oral nutrition supplement to aid pt in meeting kcal and protein needs during admission.  Meal Completion: 50-100% x 4 meals  Medications reviewed and include:  lasix, warfarin, heparin  Labs reviewed.  NUTRITION - FOCUSED PHYSICAL EXAM:  Unable to complete at this time. RD working remotely.  Diet Order:   Diet Order            Diet Heart Room service appropriate? Yes; Fluid consistency: Thin  Diet effective now                 EDUCATION NEEDS:   Education needs have been addressed  Skin:  Skin Assessment: Reviewed RN Assessment  Last BM:  05/11/20  Height:   Ht Readings from Last 1 Encounters:  05/15/20 5\' 4"  (1.626 m)    Weight:   Wt Readings from Last 1 Encounters:  05/16/20 95.9 kg    BMI:  Body mass index is 36.29 kg/m.  Estimated Nutritional Needs:   Kcal:  1800-2000  Protein:  85-100 grams  Fluid:  1.8 L/day    Gaynell Face, MS, RD, LDN Inpatient Clinical Dietitian Please see AMiON for contact information.

## 2020-05-17 NOTE — Progress Notes (Signed)
PROGRESS NOTE  Jocelyn [name] XTG:[id number] DOB: 06/12/1961 DOA: 05/15/2020 PCP: Benito Mccreedy, MD  HPI/Recap of past 24 hours: Per HPI this is a 59 year old female with past medical history of HFpEF, CKD, CAD and MI s/p stent placement, tobacco abuse, depression, TIA, hypertension, hyperlipidemia, thyrotoxicosis and acquired hypothyroidism s/p thyroidectomy who presented to the ED with concerns for left-sided abdominal pain with associated nausea, vomiting and diarrhea since Monday this week.  Reports that she ate on Monday and 30 minutes later began to have nausea, vomiting and diarrhea.  Started on Flagyl this past Tuesday and has had improvement in her bowel movements since.  Does continue to have nausea and without vomiting now.  Left-sided abdominal pain began on 10/15 which is severe without any relieving or exacerbating factors fevers, dysuria or any other concerns.  Denies any known personal or family history of clotting disorders.  No leg swelling or pain, no known history of atrial fibrillation.  Of note, she does continue to smoke cigarettes and is also on HRT for menopause. Patient seen and examined at bedside she still has some stomach pain but said is tolerable.  She was DNR but she now wants to be made full code  Assessment/Plan: Principal Problem:   Splenic infarct Active Problems:   CAD (coronary artery disease), native coronary artery   Hypertension   Hypercholesterolemia   Tobacco abuse   Aortic atherosclerosis (HCC)   Hypothyroidism   Renal infarct ([place])   Hypoxia  1. Acute splenic and left renal infarcts, possibly secondary to tobacco use in combination with estradiol a. Likely not pyelonephritis due to unremarkable UA b. Continue heparin drip c. Continue telemetry d. Strongly encouraged tobacco cessation  2. Abdominal pain, nausea and vomiting likely secondary to acute splenic and left renal infarcts, improved a. Continue Zofran b. Advance diet as  tolerated c. Continue heparin drip  3. Abnormal Asymptomatic EKG with CAD history  a. EKG: New T wave inversions in V1 through V4 with abnormal R wave progression with early transition point as well as PVCs. b. Will check a troponin c. Telemetry  4. Acute hypoxia with history of COPD, not in exacerbation Chest x-ray is overall unremarkable continue incentive spirometry and albuterol treatment  5. History of CAD s/p stents in the past  Aortic atherosclerosis  Hyperlipidemia  TIA a. Continue statin b. Continue high-dose aspirin along with heparin monitor for bleeding  6. Hypertension a. Continue Imdur Lasix and spironolactone  7. Hypokalemia a. Continue spironolactone  8. Hyponatremia a. Resolved with IV fluid  9. Thyrotoxicosis s/p thyroidectomy with acquired hypothyroidism a. Continue Synthroid  10. Tobacco use a. Advised to quit smoking  11. Chronic HFpEF a. Continue home meds  Code Status: Full  Severity of Illness: The appropriate patient status for this patient is INPATIENT. Inpatient status is judged to be reasonable and necessary in order to provide the required intensity of service to ensure the patient's safety. The patient's presenting symptoms, physical exam findings, and initial radiographic and laboratory data in the context of their chronic comorbidities is felt to place them at high risk for further clinical deterioration. Furthermore, it is not anticipated that the patient will be medically stable for discharge from the hospital within 2 midnights of admission. The following factors support the patient status of inpatient.   " The patient's presenting symptoms include severe abdominal pain. " The worrisome physical exam findings include left renal and splenic infarct " The initial radiographic and laboratory data are worrisome because of  CT abdomen splenic infarct " The chronic co-morbidities include CHF tobacco abuse   * I certify that at the  point of admission it is my clinical judgment that the patient will require inpatient hospital care spanning beyond 2 midnights from the point of admission due to high intensity of service, high risk for further deterioration and high frequency of surveillance required.*    Family Communication: None at bedside  Disposition Plan: Home when stable   Consultants:  None  Procedures:  None  Antimicrobials:  None  DVT prophylaxis: Warfarin   Objective: Vitals:   05/16/20 1647 05/16/20 1649 05/16/20 1953 05/17/20 0618  BP:  100/65 106/72 100/60  Pulse: 66 77 83 74  Resp:  16 16   Temp:  98.2 F (36.8 C) 97.9 F (36.6 C) 98.4 F (36.9 C)  TempSrc:  Oral Oral Oral  SpO2: 91% 96% 95% 100%  Weight:      Height:        Intake/Output Summary (Last 24 hours) at 05/17/2020 1409 Last data filed at 05/17/2020 0900 Gross per 24 hour  Intake 717 ml  Output --  Net 717 ml   Filed Weights   05/15/20 1637 05/15/20 1639 05/16/20 0407  Weight: 101.2 kg 93.9 kg 95.9 kg   Body mass index is 36.29 kg/m.  Exam:  . General: 59 y.o. year-old female well developed well nourished in no acute distress.  Alert and oriented x3. . Cardiovascular: Regular rate and rhythm with no rubs or gallops.  No thyromegaly or JVD noted.   Marland Kitchen Respiratory: Clear to auscultation with no wheezes or rales. Good inspiratory effort. . Abdomen: Soft nontender nondistended with normal bowel sounds x4 quadrants. . Musculoskeletal: No lower extremity edema. 2/4 pulses in all 4 extremities. . Skin: No ulcerative lesions noted or rashes, . Psychiatry: Mood is appropriate for condition and setting    Data Reviewed: CBC: Recent Labs  Lab 05/15/20 1650 05/16/20 0820 05/17/20 0708  WBC 11.1* 8.8 6.6  NEUTROABS  --  5.7  --   HGB 14.5 13.4 13.3  HCT 41.9 39.1 38.8  MCV 86.9 88.3 88.8  PLT 213 214 030   Basic Metabolic Panel: Recent Labs  Lab 05/15/20 1650 05/16/20 0820 05/17/20 1024  NA 132* 135  135  K 3.4* 3.6 3.6  CL 96* 100 98  CO2 23 24 24   GLUCOSE 93 117* 107*  BUN 15 12 12   CREATININE 1.10* 0.98 1.02*  CALCIUM 9.4 9.0 9.1  MG  --   --  1.8   GFR: Estimated Creatinine Clearance: 67.6 mL/min (A) (by C-G formula based on SCr of 1.02 mg/dL (H)). Liver Function Tests: Recent Labs  Lab 05/15/20 1650 05/16/20 0820 05/17/20 1024  AST 32 23 22  ALT 23 20 18   ALKPHOS 48 43 43  BILITOT 0.5 0.7 0.7  PROT 7.9 7.1 6.6  ALBUMIN 3.8 3.4* 3.1*   Recent Labs  Lab 05/15/20 1650  LIPASE 22   No results for input(s): AMMONIA in the last 168 hours. Coagulation Profile: Recent Labs  Lab 05/17/20 1024  INR 1.2   Cardiac Enzymes: No results for input(s): CKTOTAL, CKMB, CKMBINDEX, TROPONINI in the last 168 hours. BNP (last 3 results) No results for input(s): PROBNP in the last 8760 hours. HbA1C: No results for input(s): HGBA1C in the last 72 hours. CBG: No results for input(s): GLUCAP in the last 168 hours. Lipid Profile: No results for input(s): CHOL, HDL, LDLCALC, TRIG, CHOLHDL, LDLDIRECT in the last 72 hours.  Thyroid Function Tests: No results for input(s): TSH, T4TOTAL, FREET4, T3FREE, THYROIDAB in the last 72 hours. Anemia Panel: No results for input(s): VITAMINB12, FOLATE, FERRITIN, TIBC, IRON, RETICCTPCT in the last 72 hours. Urine analysis:    Component Value Date/Time   COLORURINE YELLOW 05/15/2020 [place] 05/15/2020 1654   LABSPEC 1.010 05/15/2020 1654   PHURINE 5.5 05/15/2020 1654   GLUCOSEU NEGATIVE 05/15/2020 1654   HGBUR NEGATIVE 05/15/2020 1654   BILIRUBINUR NEGATIVE 05/15/2020 1654   KETONESUR NEGATIVE 05/15/2020 1654   PROTEINUR NEGATIVE 05/15/2020 1654   NITRITE NEGATIVE 05/15/2020 1654   LEUKOCYTESUR TRACE (A) 05/15/2020 1654   Sepsis Labs: @LABRCNTIP (procalcitonin:4,lacticidven:4)  ) Recent Results (from the past 240 hour(s))  Resp Panel by RT PCR (RSV, Flu A&B, Covid) - Nasopharyngeal Swab     Status: None    Collection Time: 05/16/20  1:02 AM   Specimen: Nasopharyngeal Swab  Result Value Ref Range Status   SARS Coronavirus 2 by RT PCR NEGATIVE NEGATIVE Final    Comment: (NOTE) SARS-CoV-2 target nucleic acids are NOT DETECTED.  The SARS-CoV-2 RNA is generally detectable in upper respiratoy specimens during the acute phase of infection. The lowest concentration of SARS-CoV-2 viral copies this assay can detect is 131 copies/mL. A negative result does not preclude SARS-Cov-2 infection and should not be used as the sole basis for treatment or other patient management decisions. A negative result may occur with  improper specimen collection/handling, submission of specimen other than nasopharyngeal swab, presence of viral mutation(s) within the areas targeted by this assay, and inadequate number of viral copies (<131 copies/mL). A negative result must be combined with clinical observations, patient history, and epidemiological information. The expected result is Negative.  Fact Sheet for Patients:  PinkCheek.be  Fact Sheet for Healthcare Providers:  GravelBags.it  This test is no t yet approved or cleared by the [place] FDA and  has been authorized for detection and/or diagnosis of SARS-CoV-2 by FDA under an Emergency Use Authorization (EUA). This EUA will remain  in effect (meaning this test can be used) for the duration of the COVID-19 declaration under Section 564(b)(1) of the Act, 21 U.S.C. section 360bbb-3(b)(1), unless the authorization is terminated or revoked sooner.     Influenza A by PCR NEGATIVE NEGATIVE Final   Influenza B by PCR NEGATIVE NEGATIVE Final    Comment: (NOTE) The Xpert Xpress SARS-CoV-2/FLU/RSV assay is intended as an aid in  the diagnosis of influenza from Nasopharyngeal swab specimens and  should not be used as a sole basis for treatment. Nasal washings and  aspirates are unacceptable for Xpert  Xpress SARS-CoV-2/FLU/RSV  testing.  Fact Sheet for Patients: PinkCheek.be  Fact Sheet for Healthcare Providers: GravelBags.it  This test is not yet approved or cleared by the [place] FDA and  has been authorized for detection and/or diagnosis of SARS-CoV-2 by  FDA under an Emergency Use Authorization (EUA). This EUA will remain  in effect (meaning this test can be used) for the duration of the  Covid-19 declaration under Section 564(b)(1) of the Act, 21  U.S.C. section 360bbb-3(b)(1), unless the authorization is  terminated or revoked.    Respiratory Syncytial Virus by PCR NEGATIVE NEGATIVE Final    Comment: (NOTE) Fact Sheet for Patients: PinkCheek.be  Fact Sheet for Healthcare Providers: GravelBags.it  This test is not yet approved or cleared by the [place] FDA and  has been authorized for detection and/or diagnosis of SARS-CoV-2 by  FDA under an Emergency Use  Authorization (EUA). This EUA will remain  in effect (meaning this test can be used) for the duration of the  COVID-19 declaration under Section 564(b)(1) of the Act, 21 U.S.C.  section 360bbb-3(b)(1), unless the authorization is terminated or  revoked. Performed at [facility], [address] [place], [place] [address]       Studies: No results found.  Scheduled Meds: . furosemide  40 mg Oral Daily  . gabapentin  300 mg Oral Daily  . isosorbide mononitrate  60 mg Oral Daily  . levothyroxine  112 mcg Oral q morning - 10a  . linaclotide  145 mcg Oral Daily  . metoprolol succinate  12.5 mg Oral Daily  . Ensure Max Protein  11 oz Oral BID  . rosuvastatin  20 mg Oral Daily  . sodium chloride flush  3 mL Intravenous Q12H  . warfarin  10 mg Oral ONCE-1600  . Warfarin - Pharmacist Dosing Inpatient   Does not apply q1600    Continuous Infusions: . heparin 1,050 Units/hr  (05/16/20 2001)     LOS: 0 days     Cristal Deer, MD [facility]  To reach me or the doctor on call, go to: [email or web address] Password [email or web address]  05/17/2020, 2:09 PM

## 2020-05-18 DIAGNOSIS — I5042 Chronic combined systolic (congestive) and diastolic (congestive) heart failure: Secondary | ICD-10-CM | POA: Diagnosis not present

## 2020-05-18 DIAGNOSIS — I513 Intracardiac thrombosis, not elsewhere classified: Secondary | ICD-10-CM | POA: Diagnosis not present

## 2020-05-18 DIAGNOSIS — I2510 Atherosclerotic heart disease of native coronary artery without angina pectoris: Secondary | ICD-10-CM | POA: Diagnosis not present

## 2020-05-18 LAB — CBC
HCT: 38.6 % (ref 36.0–46.0)
Hemoglobin: 13.2 g/dL (ref 12.0–15.0)
MCH: 29.9 pg (ref 26.0–34.0)
MCHC: 34.2 g/dL (ref 30.0–36.0)
MCV: 87.3 fL (ref 80.0–100.0)
Platelets: 222 10*3/uL (ref 150–400)
RBC: 4.42 MIL/uL (ref 3.87–5.11)
RDW: 13 % (ref 11.5–15.5)
WBC: 6.8 10*3/uL (ref 4.0–10.5)
nRBC: 0 % (ref 0.0–0.2)

## 2020-05-18 LAB — BASIC METABOLIC PANEL
Anion gap: 12 (ref 5–15)
BUN: 16 mg/dL (ref 6–20)
CO2: 26 mmol/L (ref 22–32)
Calcium: 9.6 mg/dL (ref 8.9–10.3)
Chloride: 101 mmol/L (ref 98–111)
Creatinine, Ser: 0.98 mg/dL (ref 0.44–1.00)
GFR, Estimated: >60 mL/min (ref >60)
Glucose, Bld: 116 mg/dL — ABNORMAL HIGH (ref 70–99)
Potassium: 3.6 mmol/L (ref 3.5–5.1)
Sodium: 139 mmol/L (ref 135–145)

## 2020-05-18 LAB — PROTIME-INR
INR: 1.1 (ref 0.8–1.2)
Prothrombin Time: 13.9 seconds (ref 11.4–15.2)

## 2020-05-18 LAB — MAGNESIUM: Magnesium: 1.8 mg/dL (ref 1.7–2.4)

## 2020-05-18 LAB — HEPARIN LEVEL (UNFRACTIONATED): Heparin Unfractionated: 0.54 IU/mL (ref 0.30–0.70)

## 2020-05-18 MED ORDER — LOSARTAN POTASSIUM 50 MG PO TABS
25.0000 mg | ORAL_TABLET | Freq: Every day | ORAL | Status: DC
  Administered 2020-05-18 – 2020-05-21 (×3): 25 mg via ORAL
  Filled 2020-05-18 (×4): qty 1

## 2020-05-18 MED ORDER — WARFARIN VIDEO: Freq: Once | Status: AC

## 2020-05-18 MED ORDER — WARFARIN SODIUM 5 MG PO TABS
10.0000 mg | ORAL_TABLET | Freq: Once | ORAL | Status: AC
  Administered 2020-05-18: 10 mg via ORAL
  Filled 2020-05-18: qty 2

## 2020-05-18 MED ORDER — COUMADIN BOOK
Freq: Once | Status: AC
  Filled 2020-05-18: qty 1

## 2020-05-18 NOTE — Progress Notes (Signed)
PROGRESS NOTE    Jocelyn [name]  UVO:[id number] DOB: December 30, 1960 DOA: 05/15/2020 PCP: Benito Mccreedy, MD   Chief Complain: Abdominal pain   Brief Narrative:  Patient is a 59 year old female with history of combined systolic/diastolic congestive heart failure, CKD stage II, coronary artery disease, hypertension, hyperlipidemia, tobacco abuse, depression, TIA, thyrotoxicosis status post thyroidectomy now on thyroxine who presented to the emergency department complaints of left-sided abdominal pain with nausea, vomiting and diarrhea.  Imagings done in the emergency department showed acute splenic/renal infarcts with concern for cardioembolic stroke.  TTE showed ejection fraction of 40 to 45%, left ventricular apical thrombus.  Cardiology consulted.  Started on anticoagulation.  Currently on heparin and warfarin bridge.  Assessment & Plan:   Principal Problem:   Splenic infarct Active Problems:   CAD (coronary artery disease), native coronary artery   Hypertension   Hypercholesterolemia   Tobacco abuse   Aortic atherosclerosis (HCC)   Hypothyroidism   Renal infarct ([place])   Hypoxia   Left ventricular apical thrombus/splenic/renal infarct: Presented with abdominal pain.  Imagings showed a splenic/renal infarct suspicion for cardiomegaly stroke.  Echo showed left ventricular thrombus.  Cardiology was consulted and following.  Started on heparin drip and warfarin.  Plan is to continue to bridge until INR is around 2.  Goal INR should be 2-3.  She does not have any history of clotting disorder, DVT or PE in the past.  Abdominal pain: Secondary to splenic/renal infarcts.  Resolved  Chronic combined systolic/diastolic congestive heart failure: Echo done here showed ejection fraction of 40 to 45%.  Currently she is euvolemic.  On metoprolol.  Also started on losartan 25 mg.  Continue Lasix 40 mg daily.  Monitor daily weight, input/output.  Coronary artery disease: Status post PCI.  She was  taking aspirin at home which has been discontinued.  Continue Imdur, metoprolol.  Started on Crestor 20 mg daily.  History of TIA: On aspirin at home which will be stopped now.  Started on warfarin.  Continue Crestor  Hypertension: Blood pressure is stable but soft.  Continue Imdur, metoprolol,losartan.  Monitor blood pressure  Hyperlipidemia: Start on Crestor  Hypothyroidism: Continue Synthyroid.  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    Nutrition Problem: Increased nutrient needs Etiology: chronic illness (CHF, COPD)      DVT prophylaxis:Warfarin,heparin Code Status: Full Family Communication: None at bed side Status is: Inpatient  Remains inpatient appropriate because:Unsafe d/c plan   Dispo: The patient is from: Home              Anticipated d/c is to: Home              Anticipated d/c date is: 3 days              Patient currently is not medically stable to d/c.  INR should be at least 2 before discharge.   Consultants: Cardiology  Procedures:None  Antimicrobials:  Anti-infectives (From admission, onward)   None      Subjective:  Patient seen and examined at the bedside this afternoon.  Hemodynamically stable.  Comfortable.  Denies any abdominal pain.  Objective: Vitals:   05/17/20 0618 05/17/20 1438 05/17/20 2026 05/18/20 0640  BP: 100/60 96/72 109/72 110/68  Pulse: 74 87 82 81  Resp:  15 16 16   Temp: 98.4 F (36.9 C) 97.7 F (36.5 C) 98.2 F (36.8 C) 98.2 F (36.8 C)  TempSrc: Oral Oral Oral Oral  SpO2: 100% 97%  98% 95%  Weight:      Height:        Intake/Output Summary (Last 24 hours) at 05/18/2020 1335 Last data filed at 05/18/2020 1030 Gross per 24 hour  Intake 680.4 ml  Output --  Net 680.4 ml   Filed Weights   05/15/20 1637 05/15/20 1639 05/16/20 0407  Weight: 101.2 kg 93.9 kg 95.9 kg    Examination:  General exam: Appears calm and comfortable ,Not  in distress,average built HEENT:PERRL,Oral mucosa moist, Ear/Nose normal on gross exam Respiratory system: Bilateral equal air entry, normal vesicular breath sounds, no wheezes or crackles  Cardiovascular system: S1 & S2 heard, RRR. No JVD, murmurs, rubs, gallops or clicks. No pedal edema. Gastrointestinal system: Abdomen is nondistended, soft and nontender. No organomegaly or masses felt. Normal bowel sounds heard. Central nervous system: Alert and oriented. No focal neurological deficits. Extremities: Mild  Edema of right lower extremity, no clubbing ,no cyanosis Skin: No rashes, lesions or ulcers,no icterus ,no pallor   Data Reviewed: I have personally reviewed following labs and imaging studies  CBC: Recent Labs  Lab 05/15/20 1650 05/16/20 0820 05/17/20 0708 05/18/20 0546  WBC 11.1* 8.8 6.6 6.8  NEUTROABS  --  5.7  --   --   HGB 14.5 13.4 13.3 13.2  HCT 41.9 39.1 38.8 38.6  MCV 86.9 88.3 88.8 87.3  PLT 213 214 226 716   Basic Metabolic Panel: Recent Labs  Lab 05/15/20 1650 05/16/20 0820 05/17/20 1024 05/18/20 0546  NA 132* 135 135 139  K 3.4* 3.6 3.6 3.6  CL 96* 100 98 101  CO2 23 24 24 26   GLUCOSE 93 117* 107* 116*  BUN 15 12 12 16   CREATININE 1.10* 0.98 1.02* 0.98  CALCIUM 9.4 9.0 9.1 9.6  MG  --   --  1.8 1.8   GFR: Estimated Creatinine Clearance: 70.3 mL/min (by C-G formula based on SCr of 0.98 mg/dL). Liver Function Tests: Recent Labs  Lab 05/15/20 1650 05/16/20 0820 05/17/20 1024  AST 32 23 22  ALT 23 20 18   ALKPHOS 48 43 43  BILITOT 0.5 0.7 0.7  PROT 7.9 7.1 6.6  ALBUMIN 3.8 3.4* 3.1*   Recent Labs  Lab 05/15/20 1650  LIPASE 22   No results for input(s): AMMONIA in the last 168 hours. Coagulation Profile: Recent Labs  Lab 05/17/20 1024 05/18/20 0546  INR 1.2 1.1   Cardiac Enzymes: No results for input(s): CKTOTAL, CKMB, CKMBINDEX, TROPONINI in the last 168 hours. BNP (last 3 results) No results for input(s): PROBNP in the last 8760  hours. HbA1C: No results for input(s): HGBA1C in the last 72 hours. CBG: No results for input(s): GLUCAP in the last 168 hours. Lipid Profile: No results for input(s): CHOL, HDL, LDLCALC, TRIG, CHOLHDL, LDLDIRECT in the last 72 hours. Thyroid Function Tests: No results for input(s): TSH, T4TOTAL, FREET4, T3FREE, THYROIDAB in the last 72 hours. Anemia Panel: No results for input(s): VITAMINB12, FOLATE, FERRITIN, TIBC, IRON, RETICCTPCT in the last 72 hours. Sepsis Labs: Recent Labs  Lab 05/16/20 0820 05/16/20 1058  LATICACIDVEN 0.9 1.2    Recent Results (from the past 240 hour(s))  Resp Panel by RT PCR (RSV, Flu A&B, Covid) - Nasopharyngeal Swab     Status: None   Collection Time: 05/16/20  1:02 AM   Specimen: Nasopharyngeal Swab  Result Value Ref Range Status   SARS Coronavirus 2 by RT PCR NEGATIVE NEGATIVE Final    Comment: (NOTE) SARS-CoV-2 target nucleic acids are NOT  DETECTED.  The SARS-CoV-2 RNA is generally detectable in upper respiratoy specimens during the acute phase of infection. The lowest concentration of SARS-CoV-2 viral copies this assay can detect is 131 copies/mL. A negative result does not preclude SARS-Cov-2 infection and should not be used as the sole basis for treatment or other patient management decisions. A negative result may occur with  improper specimen collection/handling, submission of specimen other than nasopharyngeal swab, presence of viral mutation(s) within the areas targeted by this assay, and inadequate number of viral copies (<131 copies/mL). A negative result must be combined with clinical observations, patient history, and epidemiological information. The expected result is Negative.  Fact Sheet for Patients:  PinkCheek.be  Fact Sheet for Healthcare Providers:  GravelBags.it  This test is no t yet approved or cleared by the [place] FDA and  has been authorized for  detection and/or diagnosis of SARS-CoV-2 by FDA under an Emergency Use Authorization (EUA). This EUA will remain  in effect (meaning this test can be used) for the duration of the COVID-19 declaration under Section 564(b)(1) of the Act, 21 U.S.C. section 360bbb-3(b)(1), unless the authorization is terminated or revoked sooner.     Influenza A by PCR NEGATIVE NEGATIVE Final   Influenza B by PCR NEGATIVE NEGATIVE Final    Comment: (NOTE) The Xpert Xpress SARS-CoV-2/FLU/RSV assay is intended as an aid in  the diagnosis of influenza from Nasopharyngeal swab specimens and  should not be used as a sole basis for treatment. Nasal washings and  aspirates are unacceptable for Xpert Xpress SARS-CoV-2/FLU/RSV  testing.  Fact Sheet for Patients: PinkCheek.be  Fact Sheet for Healthcare Providers: GravelBags.it  This test is not yet approved or cleared by the [place] FDA and  has been authorized for detection and/or diagnosis of SARS-CoV-2 by  FDA under an Emergency Use Authorization (EUA). This EUA will remain  in effect (meaning this test can be used) for the duration of the  Covid-19 declaration under Section 564(b)(1) of the Act, 21  U.S.C. section 360bbb-3(b)(1), unless the authorization is  terminated or revoked.    Respiratory Syncytial Virus by PCR NEGATIVE NEGATIVE Final    Comment: (NOTE) Fact Sheet for Patients: PinkCheek.be  Fact Sheet for Healthcare Providers: GravelBags.it  This test is not yet approved or cleared by the [place] FDA and  has been authorized for detection and/or diagnosis of SARS-CoV-2 by  FDA under an Emergency Use Authorization (EUA). This EUA will remain  in effect (meaning this test can be used) for the duration of the  COVID-19 declaration under Section 564(b)(1) of the Act, 21 U.S.C.  section 360bbb-3(b)(1), unless the  authorization is terminated or  revoked. Performed at [facility], [address], [place], [place] [address]          Radiology Studies: No results found.      Scheduled Meds:  furosemide  40 mg Oral Daily   gabapentin  300 mg Oral Daily   isosorbide mononitrate  60 mg Oral Daily   levothyroxine  112 mcg Oral q morning - 10a   linaclotide  145 mcg Oral Daily   losartan  25 mg Oral Daily   metoprolol succinate  12.5 mg Oral Daily   Ensure Max Protein  11 oz Oral BID   rosuvastatin  20 mg Oral Daily   sodium chloride flush  3 mL Intravenous Q12H   warfarin  10 mg Oral ONCE-1600   Warfarin - Pharmacist Dosing Inpatient   Does  not apply q1600   Continuous Infusions:  heparin 1,050 Units/hr (05/17/20 1825)     LOS: 1 day    Time spent: 25 mins.More than 50% of that time was spent in counseling and/or coordination of care.      Shelly Coss, MD [facility] P10/18/2021, 1:35 PM

## 2020-05-18 NOTE — Progress Notes (Addendum)
[facility] for heparin/warfarin Indication: renal and splenic infarcts + LV thrombus  Allergies  Allergen Reactions  . Hydrocodone Nausea Only    Has problems with high doses    Patient Measurements: Height: 5\' 4"  (162.6 cm) Weight: 95.9 kg (211 lb 6.7 oz) IBW/kg (Calculated) : 54.7 Heparin Dosing Weight: 78 kg  Vital Signs: Temp: 98.2 F (36.8 C) (10/18 0640) Temp Source: Oral (10/18 0640) BP: 110/68 (10/18 0640) Pulse Rate: 81 (10/18 0640)  Labs: Recent Labs    05/16/20 0820 05/16/20 0820 05/16/20 1058 05/16/20 1556 05/16/20 2233 05/17/20 0708 05/17/20 1024 05/18/20 0546  HGB 13.4   < >  --   --   --  13.3  --  13.2  HCT 39.1  --   --   --   --  38.8  --  38.6  PLT 214  --   --   --   --  226  --  222  LABPROT  --   --   --   --   --   --  14.6 13.9  INR  --   --   --   --   --   --  1.2 1.1  HEPARINUNFRC 0.83*  --   --    < > 0.58 0.41  --  0.54  CREATININE 0.98  --   --   --   --   --  1.02* 0.98  TROPONINIHS 113*  --  123*  --   --   --   --   --    < > = values in this interval not displayed.    Estimated Creatinine Clearance: 70.3 mL/min (by C-G formula based on SCr of 0.98 mg/dL).   Assessment: Patient's a 59 y.o F presented to the ED on 10/15 with c/o abdominal pain and n/v.  Abdominal CT showed findings with concern for splenic and renal infarcts. Echo on 10/16 showed LV apical thrombus. Heparin drip started on 10/16 and warfarin started on 10/17.  Today, 05/18/2020: - Day #2 of at least 5 days overlap  - Heparin level remains therapeutic at 0.54 on heparin infusion at 1050 units/hr - INR is sub-therapeutic at 1.1 with first dose of warfarin given yesterday - cbc stable - no bleeding documented - no significant drug-drug intxns noted  Goal of Therapy:  INR 2-3 Heparin level 0.3-0.7 units/ml Monitor platelets by anticoagulation protocol: Yes   Plan:  - Continue heparin drip at 1050 units/hr - repeat  warfarin 10 mg x1 today - Daily heparin level, PT/INR, & CBC - monitor for s/sx bleeding  Dia Sitter, PharmD, BCPS 05/18/2020 7:20 AM

## 2020-05-18 NOTE — Progress Notes (Addendum)
Progress Note  Patient Name: Jocelyn [name] Date of Encounter: 05/18/2020  [facility] Cardiologist: Larae Grooms, MD   Subjective   No acute overnight events. No chest pain, shortness of breath, or palpitations. GI symptoms have improved.  Inpatient Medications    Scheduled Meds: . furosemide  40 mg Oral Daily  . gabapentin  300 mg Oral Daily  . isosorbide mononitrate  60 mg Oral Daily  . levothyroxine  112 mcg Oral q morning - 10a  . linaclotide  145 mcg Oral Daily  . metoprolol succinate  12.5 mg Oral Daily  . Ensure Max Protein  11 oz Oral BID  . rosuvastatin  20 mg Oral Daily  . sodium chloride flush  3 mL Intravenous Q12H  . warfarin  10 mg Oral ONCE-1600  . Warfarin - Pharmacist Dosing Inpatient   Does not apply q1600   Continuous Infusions: . heparin 1,050 Units/hr (05/17/20 1825)   PRN Meds: acetaminophen **OR** acetaminophen, albuterol, cyclobenzaprine, docusate sodium, HYDROcodone-acetaminophen, morphine injection   Vital Signs    Vitals:   05/17/20 0618 05/17/20 1438 05/17/20 2026 05/18/20 0640  BP: 100/60 96/72 109/72 110/68  Pulse: 74 87 82 81  Resp:  15 16 16   Temp: 98.4 F (36.9 C) 97.7 F (36.5 C) 98.2 F (36.8 C) 98.2 F (36.8 C)  TempSrc: Oral Oral Oral Oral  SpO2: 100% 97% 98% 95%  Weight:      Height:        Intake/Output Summary (Last 24 hours) at 05/18/2020 1210 Last data filed at 05/18/2020 0239 Gross per 24 hour  Intake 440.4 ml  Output --  Net 440.4 ml   Last 3 Weights 05/16/2020 05/15/2020 05/15/2020  Weight (lbs) 211 lb 6.7 oz 207 lb 223 lb 1.7 oz  Weight (kg) 95.9 kg 93.895 kg 101.2 kg      Telemetry    Normal sinus rhythm with rates in the 70's to 80's. Frequent PVCs noted, sometimes in bigeminy pattern and some couplets noted. - Personally Reviewed  ECG    No new ECG tracing today. - Personally Reviewed  Physical Exam   GEN: No acute distress.   Neck: No JVD Cardiac: RRR. No murmurs, rubs, or gallops.   Respiratory: Clear to auscultation bilaterally. GI: Soft, non-tender, non-distended. Bowel sounds present. MS: No lower extremity edema. No deformity. Skin: Warm and dry. Neuro:  No focal deficits. Psych: Normal affect.  Labs    High Sensitivity Troponin:   Recent Labs  Lab 05/16/20 0820 05/16/20 1058  TROPONINIHS 113* 123*      Chemistry Recent Labs  Lab 05/15/20 1650 05/15/20 1650 05/16/20 0820 05/17/20 1024 05/18/20 0546  NA 132*   < > 135 135 139  K 3.4*   < > 3.6 3.6 3.6  CL 96*   < > 100 98 101  CO2 23   < > 24 24 26   GLUCOSE 93   < > 117* 107* 116*  BUN 15   < > 12 12 16   CREATININE 1.10*   < > 0.98 1.02* 0.98  CALCIUM 9.4   < > 9.0 9.1 9.6  PROT 7.9  --  7.1 6.6  --   ALBUMIN 3.8  --  3.4* 3.1*  --   AST 32  --  23 22  --   ALT 23  --  20 18  --   ALKPHOS 48  --  43 43  --   BILITOT 0.5  --  0.7 0.7  --  GFRNONAA 55*   < > >60 >60 >60  ANIONGAP 13   < > 11 13 12    < > = values in this interval not displayed.     Hematology Recent Labs  Lab 05/16/20 0820 05/17/20 0708 05/18/20 0546  WBC 8.8 6.6 6.8  RBC 4.43 4.37 4.42  HGB 13.4 13.3 13.2  HCT 39.1 38.8 38.6  MCV 88.3 88.8 87.3  MCH 30.2 30.4 29.9  MCHC 34.3 34.3 34.2  RDW 13.2 12.9 13.0  PLT 214 226 222    BNPNo results for input(s): BNP, PROBNP in the last 168 hours.   DDimer No results for input(s): DDIMER in the last 168 hours.   Radiology    ECHOCARDIOGRAM COMPLETE  Result Date: 05/16/2020    ECHOCARDIOGRAM REPORT   Patient Name:   Jocelyn [name] Date of Exam: 05/16/2020 Medical Rec #:  [id number]      Height:       64.0 in Accession #:    [id number]     Weight:       211.4 lb Date of Birth:  Jan 12, 1961     BSA:          2.003 m Patient Age:    58 years       BP:           105/75 mmHg Patient Gender: F              HR:           81 bpm. Exam Location:  Inpatient Procedure: 2D Echo, Cardiac Doppler, Color Doppler and Intracardiac            Opacification Agent Indications:    R94.31  Abnormal EKG; Elevated Troponin.  History:        Patient has prior history of Echocardiogram examinations, most                 recent 02/09/2015. Previous Myocardial Infarction, COPD and TIA;                 Risk Factors:Hypertension and Dyslipidemia. CKD. Hypothyroidism.  Sonographer:    Tiffany Dance Referring Phys: [id number] Harold Hedge  Sonographer Comments: Technically difficult study due to poor echo windows. IMPRESSIONS  1. LV apical thrombus 1.4 x 1 cm.  2. Very limited image quality due to suboptimal acoustic windows.  3. Left ventricular ejection fraction, by estimation, is 40 to 45%. The left ventricle has mildly decreased function. Left ventricular endocardial border not optimally defined to evaluate regional wall motion. There is mild left ventricular hypertrophy.  Left ventricular diastolic parameters are consistent with Grade I diastolic dysfunction (impaired relaxation).  4. Right ventricular systolic function is normal. The right ventricular size is not well visualized.  5. The mitral valve is grossly normal. No evidence of mitral valve regurgitation.  6. The aortic valve was not well visualized. Aortic valve regurgitation is not visualized. No aortic stenosis is present.  7. The inferior vena cava is normal in size with greater than 50% respiratory variability, suggesting right atrial pressure of 3 mmHg. Conclusion(s)/Recommendation(s): Critical findings reported to Dr. Neysa Bonito and acknowledged at 1530 05/16/20. FINDINGS  Left Ventricle: Left ventricular ejection fraction, by estimation, is 40 to 45%. The left ventricle has mildly decreased function. Left ventricular endocardial border not optimally defined to evaluate regional wall motion. Definity contrast agent was given IV to delineate the left ventricular endocardial borders. The left ventricular internal cavity size was normal in size. There is mild  left ventricular hypertrophy. Left ventricular diastolic parameters are consistent with Grade I  diastolic dysfunction (impaired relaxation). Right Ventricle: The right ventricular size is not well visualized. Right vetricular wall thickness was not assessed. Right ventricular systolic function is normal. Left Atrium: Left atrial size was normal in size. Right Atrium: Right atrial size was normal in size. Pericardium: There is no evidence of pericardial effusion. Mitral Valve: The mitral valve is grossly normal. No evidence of mitral valve regurgitation. Tricuspid Valve: The tricuspid valve is not well visualized. Tricuspid valve regurgitation is not demonstrated. Aortic Valve: The aortic valve was not well visualized. Aortic valve regurgitation is not visualized. No aortic stenosis is present. Pulmonic Valve: The pulmonic valve was not well visualized. Aorta: The aortic root and ascending aorta are structurally normal, with no evidence of dilitation. Venous: The inferior vena cava is normal in size with greater than 50% respiratory variability, suggesting right atrial pressure of 3 mmHg. IAS/Shunts: No atrial level shunt detected by color flow Doppler.  LEFT VENTRICLE PLAX 2D LVIDd:         3.90 cm  Diastology LVIDs:         2.40 cm  LV e' medial:    4.90 cm/s LV PW:         1.40 cm  LV E/e' medial:  7.8 LV IVS:        1.10 cm  LV e' lateral:   6.74 cm/s LVOT diam:     1.70 cm  LV E/e' lateral: 5.7 LV SV:         32 LV SV Index:   16 LVOT Area:     2.27 cm  RIGHT VENTRICLE             IVC RV Basal diam:  2.80 cm     IVC diam: 1.50 cm RV S prime:     10.60 cm/s TAPSE (M-mode): 1.5 cm LEFT ATRIUM           Index       RIGHT ATRIUM           Index LA diam:      3.80 cm 1.90 cm/m  RA Area:     13.20 cm LA Vol (A2C): 53.8 ml 26.86 ml/m RA Volume:   28.80 ml  14.38 ml/m LA Vol (A4C): 17.2 ml 8.59 ml/m  AORTIC VALVE LVOT Vmax:   77.70 cm/s LVOT Vmean:  50.400 cm/s LVOT VTI:    0.142 m  AORTA Ao Root diam: 2.90 cm Ao Asc diam:  3.00 cm MITRAL VALVE MV Area (PHT): 2.13 cm    SHUNTS MV Decel Time: 356 msec     Systemic VTI:  0.14 m MV E velocity: 38.10 cm/s  Systemic Diam: 1.70 cm MV A velocity: 74.10 cm/s MV E/A ratio:  0.51 Cherlynn Kaiser MD Electronically signed by Cherlynn Kaiser MD Signature Date/Time: 05/16/2020/3:42:15 PM    Final     Cardiac Studies   Echocardiogram 05/16/2020: Study Conclusions:  - Left ventricle: The cavity size was normal. Wall thickness was  normal. Systolic function was normal. The estimated ejection  fraction was in the range of 55% to 60%. Wall motion was normal;  there were no regional wall motion abnormalities. Doppler  parameters are consistent with abnormal left ventricular  relaxation (grade 1 diastolic dysfunction). The E/e&' ratio is <8,  suggesting normal LV filling pressure.  - Left atrium: The atrium was normal in size.   Impressions:  - LVEF 55-60%, normal wall thickness, normal  wall motion, diastolic  dysfunction, normal LV filling pressure, normal LA size.   Patient Profile     59 y.o. female with a history of CAD with prior stenting to OM1 with 100% in-stent restenosis on last cath in 2017 treated medically, chronic diastolic CHF, TIA, hypertension, hyperlipidemia, and CKD stage II-III who is been seen for evaluation of newly diagnosed LV thrombus and CHF with reduced EF of 40-45% at the request of Dr. Kyung Bacca.  Assessment & Plan    LV Thrombus - Patient initially presented with abdominal pain, nausea, and vomiting and was found to have renal and splenic infarcts on CT with concern for cardioembolic source. TTE showed LV thrombus with mildly reduced EF. Apex does not appear to be akinetic based on limited views, however, patient has risk factors for clot formation including HRT and tobacco abuse.  - Started on Warfarin yesterday. INR 1.1 today. Continue IV Heparin until therapeutic. Dosing per pharmacy.  Chronic Combined CHF  - Echo this admission showed LVEF of 40-45% with grade 1 diastolic dysfunction. EF down from 55-60% in  2016. - No documented output but appears euvolemic. - Continue PO Lasix 40mg  daily. - Toprol-XL 12.5mg  daily started yesterday. - Would benefit from ACEi/ARB. BP has been soft with systolic BP in the 66'Y at times yesterday but improved to 100's to 110's today. Will start Losartan 25mg  daily. - Please monitor I/O's and daily weights. Will place care order for this.  CAD - s/p prior stenting to OM1. Occluded OM1 stent noted on cath in 2017. No other significant CAD. - High-sensitivity troponin mildly elevated but flat at 113 >> 123. Not consistent with ACS. - Aspirin discontinued due to need for Warfarin. - Continue Toprol-XL 12.5mg  daily, Imdur 60mg  daily, and Crestor 20mg  daily.  Prior TIA - Aspirin discontinued due to need for Warfarin. - Continue Crestor 20mg  daily.  Hypertension - BP well controlled and actually soft at times. - Continue Imdur and Toprol-XL. - May be able to add Losartan as above.  Hyperlipidemia - Patient previously on Simvastatin at home but was no longer taking this. Started on Crestor 20mg  this admission. - Will need lipid panel and LFTs in 6-8 weeks.  For questions or updates, please contact [place] Please consult [email or web address] for contact info under        Signed, Darreld Mclean, PA-C  05/18/2020, 12:10 PM    Patient seen and examined and agree with Sande Rives, PA as detailed above.  In brief, patient is a 59 year old female with a history of CAD with prior stenting to OM1 with 100% in-stent restenosis on last cath in 2017 treated medically, TIA, hypertension, hyperlipidemia, and CKD stage II-III who is been seen for evaluation of newly diagnosed LV thrombus and CHF with reduced EF of 40-45%.  Patient stable and starting warfarin. Anxious to leave the hospital as soon as possible.  GEN: No acute distress.   Neck: No JVD Cardiac: RRR, no murmurs, rubs, or gallops.  Respiratory: Clear to auscultation bilaterally. GI: Soft,  nontender, non-distended  MS: No edema; No deformity. Neuro:  Nonfocal  Psych: Normal affect   Plan: -Continue warfarin per pharmacy  -Once INR therapeutic and off heparin gtt; can discharge home -Anticipate 6 month course of AC; will repeat TTE at that time -Continue Toprol-XL 12.5mg  daily, Imdur 60mg  daily, and Crestor 20mg  daily -Start low dose losartan -Patient motivated to quit smoking and agreed to stopping HRT  Gwyndolyn Kaufman, MD

## 2020-05-19 DIAGNOSIS — I513 Intracardiac thrombosis, not elsewhere classified: Secondary | ICD-10-CM | POA: Diagnosis not present

## 2020-05-19 LAB — PROTIME-INR
INR: 1.4 — ABNORMAL HIGH (ref 0.8–1.2)
Prothrombin Time: 16.4 seconds — ABNORMAL HIGH (ref 11.4–15.2)

## 2020-05-19 LAB — BASIC METABOLIC PANEL
Anion gap: 12 (ref 5–15)
BUN: 14 mg/dL (ref 6–20)
CO2: 26 mmol/L (ref 22–32)
Calcium: 9.4 mg/dL (ref 8.9–10.3)
Chloride: 100 mmol/L (ref 98–111)
Creatinine, Ser: 0.82 mg/dL (ref 0.44–1.00)
GFR, Estimated: >60 mL/min (ref >60)
Glucose, Bld: 110 mg/dL — ABNORMAL HIGH (ref 70–99)
Potassium: 3.6 mmol/L (ref 3.5–5.1)
Sodium: 138 mmol/L (ref 135–145)

## 2020-05-19 LAB — MAGNESIUM: Magnesium: 1.8 mg/dL (ref 1.7–2.4)

## 2020-05-19 LAB — CBC
HCT: 39.4 % (ref 36.0–46.0)
Hemoglobin: 13.5 g/dL (ref 12.0–15.0)
MCH: 29.9 pg (ref 26.0–34.0)
MCHC: 34.3 g/dL (ref 30.0–36.0)
MCV: 87.4 fL (ref 80.0–100.0)
Platelets: 249 10*3/uL (ref 150–400)
RBC: 4.51 MIL/uL (ref 3.87–5.11)
RDW: 13.1 % (ref 11.5–15.5)
WBC: 7.6 10*3/uL (ref 4.0–10.5)
nRBC: 0 % (ref 0.0–0.2)

## 2020-05-19 LAB — HEPARIN LEVEL (UNFRACTIONATED): Heparin Unfractionated: 0.59 IU/mL (ref 0.30–0.70)

## 2020-05-19 MED ORDER — LIP MEDEX EX OINT
TOPICAL_OINTMENT | CUTANEOUS | Status: AC
  Filled 2020-05-19: qty 7

## 2020-05-19 MED ORDER — WARFARIN SODIUM 5 MG PO TABS
7.5000 mg | ORAL_TABLET | Freq: Once | ORAL | Status: AC
  Administered 2020-05-19: 7.5 mg via ORAL
  Filled 2020-05-19: qty 1

## 2020-05-19 NOTE — Progress Notes (Signed)
[place] for heparin/warfarin Indication: renal and splenic infarcts + LV thrombus  Allergies  Allergen Reactions  . Hydrocodone Nausea Only    Has problems with high doses    Patient Measurements: Height: 5\' 4"  (162.6 cm) Weight: 95.9 kg (211 lb 6.7 oz) IBW/kg (Calculated) : 54.7 Heparin Dosing Weight: 78 kg  Vital Signs: Temp: 97.7 F (36.5 C) (10/19 0603) Temp Source: Oral (10/19 0603) BP: 91/68 (10/19 0603) Pulse Rate: 76 (10/19 0603)  Labs: Recent Labs     0000 05/16/20 0820 05/16/20 0820 05/16/20 1058 05/16/20 1556 05/17/20 0708 05/17/20 0708 05/17/20 1024 05/18/20 0546 05/19/20 0519  HGB   < > 13.4  --   --   --  13.3   < >  --  13.2 13.5  HCT   < > 39.1  --   --   --  38.8  --   --  38.6 39.4  PLT   < > 214  --   --   --  226  --   --  222 249  LABPROT  --   --   --   --   --   --   --  14.6 13.9 16.4*  INR  --   --   --   --   --   --   --  1.2 1.1 1.4*  HEPARINUNFRC  --  0.83*  --   --    < > 0.41  --   --  0.54 0.59  CREATININE  --  0.98   < >  --   --   --   --  1.02* 0.98 0.82  TROPONINIHS  --  113*  --  123*  --   --   --   --   --   --    < > = values in this interval not displayed.    Estimated Creatinine Clearance: 84.1 mL/min (by C-G formula based on SCr of 0.82 mg/dL).   Assessment: Patient's a 59 y.o F presented to the ED on 10/15 with c/o abdominal pain and n/v.  Abdominal CT showed findings with concern for splenic and renal infarcts. Echo on 10/16 showed LV apical thrombus. Heparin drip started on 10/16 and warfarin started on 10/17.  Today, 05/19/2020: - Day #3 of at least 5 days overlap  - Heparin level remains therapeutic at 0.59 on heparin infusion at 1050 units/hr - INR is sub-therapeutic at 1.4 but increasing - cbc stable - no bleeding documented - no significant drug-drug intxns noted  Goal of Therapy:  INR 2-3 Heparin level 0.3-0.7 units/ml Monitor platelets by anticoagulation  protocol: Yes   Plan:  - Continue heparin drip at 1050 units/hr - Warfarin 7.5 mg x1 today - Daily heparin level, PT/INR, & CBC - monitor for s/sx bleeding  Ulice Dash, PharmD, BCPS [phone] 05/19/2020 7:06 AM

## 2020-05-19 NOTE — Progress Notes (Signed)
PROGRESS NOTE    Jocelyn [name]  DQQ:[id number] DOB: 1960-08-29 DOA: 05/15/2020 PCP: Benito Mccreedy, MD   Chief Complain: Abdominal pain   Brief Narrative:  Patient is a 59 year old female with history of combined systolic/diastolic congestive heart failure, CKD stage II, coronary artery disease, hypertension, hyperlipidemia, tobacco abuse, depression, TIA, thyrotoxicosis status post thyroidectomy now on thyroxine who presented to the emergency department complaints of left-sided abdominal pain with nausea, vomiting and diarrhea.  Imagings done in the emergency department showed acute splenic/renal infarcts with concern for cardioembolic stroke.  TTE showed ejection fraction of 40 to 45%, left ventricular apical thrombus.  Cardiology consulted.  Started on anticoagulation.  Currently on heparin and warfarin bridge.  Assessment & Plan:   Principal Problem:   Splenic infarct Active Problems:   CAD (coronary artery disease), native coronary artery   Hypertension   Hypercholesterolemia   Tobacco abuse   Aortic atherosclerosis (HCC)   Hypothyroidism   Renal infarct ([place])   Hypoxia   Left ventricular apical thrombus/splenic/renal infarct: Presented with abdominal pain.  Imagings showed a splenic/renal infarct suspicion for cardiomegaly stroke.  Echo showed left ventricular thrombus.  Cardiology was consulted and following.  Started on heparin drip and warfarin.  Plan is to continue to bridge until INR is around 2.  Goal INR should be 2-3.  She does not have any history of clotting disorder, DVT or PE in the past.  Abdominal pain: Secondary to splenic/renal infarcts.  Improved  Chronic combined systolic/diastolic congestive heart failure: Echo done here showed ejection fraction of 40 to 45%.  Currently she is euvolemic.  On metoprolol.  Also started on losartan 25 mg.  Continue Lasix 40 mg daily.  Monitor daily weight, input/output.  Coronary artery disease: Status post PCI.  She was  taking aspirin at home which has been discontinued.  Continue Imdur, metoprolol.  Started on Crestor 20 mg daily.  History of TIA: On aspirin at home which will be stopped now.  Started on warfarin.  Continue Crestor  Hypertension: Blood pressure is stable but soft.  Continue Imdur, metoprolol,losartan.  Monitor blood pressure  Hyperlipidemia: Start on Crestor  Hypothyroidism: Continue Synthyroid.  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    Nutrition Problem: Increased nutrient needs Etiology: chronic illness (CHF, COPD)      DVT prophylaxis:Warfarin,heparin Code Status: Full Family Communication: None at bed side Status is: Inpatient  Remains inpatient appropriate because:Unsafe d/c plan   Dispo: The patient is from: Home              Anticipated d/c is to: Home              Anticipated d/c date is: 2 days              Patient currently is not medically stable to d/c.  INR should be at least 2 before discharge.   Consultants: Cardiology  Procedures:None  Antimicrobials:  Anti-infectives (From admission, onward)   None      Subjective:  Patient seen and examined the bedside this morning.  Comfortable.  Complains of some left-sided flank pain today.  But during my evaluation she was comfortable.  Eager to go home  Objective: Vitals:   05/18/20 0640 05/18/20 1449 05/18/20 2132 05/19/20 0603  BP: 110/68 91/64 101/73 91/68  Pulse: 81 79 80 76  Resp: 16 16 16 16   Temp: 98.2 F (36.8 C) 98 F (36.7 C) 97.9 F (36.6 C) 97.7 F (  36.5 C)  TempSrc: Oral Oral Oral Oral  SpO2: 95% 99% 96% 98%  Weight:      Height:        Intake/Output Summary (Last 24 hours) at 05/19/2020 0811 Last data filed at 05/18/2020 2138 Gross per 24 hour  Intake 360 ml  Output 1000 ml  Net -640 ml   Filed Weights   05/15/20 1637 05/15/20 1639 05/16/20 0407  Weight: 101.2 kg 93.9 kg 95.9 kg     Examination:   General exam: Appears calm and comfortable ,Not in distress,average built HEENT:PERRL,Oral mucosa moist, Ear/Nose normal on gross exam Respiratory system: Bilateral equal air entry, normal vesicular breath sounds, no wheezes or crackles  Cardiovascular system: S1 & S2 heard, RRR. No JVD, murmurs, rubs, gallops or clicks. Gastrointestinal system: Abdomen is nondistended, soft and nontender. No organomegaly or masses felt. Normal bowel sounds heard. Central nervous system: Alert and oriented. No focal neurological deficits. Extremities: No edema, no clubbing ,no cyanosis Skin: No rashes, lesions or ulcers,no icterus ,no pallor   Data Reviewed: I have personally reviewed following labs and imaging studies  CBC: Recent Labs  Lab 05/15/20 1650 05/16/20 0820 05/17/20 0708 05/18/20 0546 05/19/20 0519  WBC 11.1* 8.8 6.6 6.8 7.6  NEUTROABS  --  5.7  --   --   --   HGB 14.5 13.4 13.3 13.2 13.5  HCT 41.9 39.1 38.8 38.6 39.4  MCV 86.9 88.3 88.8 87.3 87.4  PLT 213 214 226 222 937   Basic Metabolic Panel: Recent Labs  Lab 05/15/20 1650 05/16/20 0820 05/17/20 1024 05/18/20 0546 05/19/20 0519  NA 132* 135 135 139 138  K 3.4* 3.6 3.6 3.6 3.6  CL 96* 100 98 101 100  CO2 23 24 24 26 26   GLUCOSE 93 117* 107* 116* 110*  BUN 15 12 12 16 14   CREATININE 1.10* 0.98 1.02* 0.98 0.82  CALCIUM 9.4 9.0 9.1 9.6 9.4  MG  --   --  1.8 1.8 1.8   GFR: Estimated Creatinine Clearance: 84.1 mL/min (by C-G formula based on SCr of 0.82 mg/dL). Liver Function Tests: Recent Labs  Lab 05/15/20 1650 05/16/20 0820 05/17/20 1024  AST 32 23 22  ALT 23 20 18   ALKPHOS 48 43 43  BILITOT 0.5 0.7 0.7  PROT 7.9 7.1 6.6  ALBUMIN 3.8 3.4* 3.1*   Recent Labs  Lab 05/15/20 1650  LIPASE 22   No results for input(s): AMMONIA in the last 168 hours. Coagulation Profile: Recent Labs  Lab 05/17/20 1024 05/18/20 0546 05/19/20 0519  INR 1.2 1.1 1.4*   Cardiac Enzymes: No results for  input(s): CKTOTAL, CKMB, CKMBINDEX, TROPONINI in the last 168 hours. BNP (last 3 results) No results for input(s): PROBNP in the last 8760 hours. HbA1C: No results for input(s): HGBA1C in the last 72 hours. CBG: No results for input(s): GLUCAP in the last 168 hours. Lipid Profile: No results for input(s): CHOL, HDL, LDLCALC, TRIG, CHOLHDL, LDLDIRECT in the last 72 hours. Thyroid Function Tests: No results for input(s): TSH, T4TOTAL, FREET4, T3FREE, THYROIDAB in the last 72 hours. Anemia Panel: No results for input(s): VITAMINB12, FOLATE, FERRITIN, TIBC, IRON, RETICCTPCT in the last 72 hours. Sepsis Labs: Recent Labs  Lab 05/16/20 0820 05/16/20 1058  LATICACIDVEN 0.9 1.2    Recent Results (from the past 240 hour(s))  Resp Panel by RT PCR (RSV, Flu A&B, Covid) - Nasopharyngeal Swab     Status: None   Collection Time: 05/16/20  1:02 AM   Specimen:  Nasopharyngeal Swab  Result Value Ref Range Status   SARS Coronavirus 2 by RT PCR NEGATIVE NEGATIVE Final    Comment: (NOTE) SARS-CoV-2 target nucleic acids are NOT DETECTED.  The SARS-CoV-2 RNA is generally detectable in upper respiratoy specimens during the acute phase of infection. The lowest concentration of SARS-CoV-2 viral copies this assay can detect is 131 copies/mL. A negative result does not preclude SARS-Cov-2 infection and should not be used as the sole basis for treatment or other patient management decisions. A negative result may occur with  improper specimen collection/handling, submission of specimen other than nasopharyngeal swab, presence of viral mutation(s) within the areas targeted by this assay, and inadequate number of viral copies (<131 copies/mL). A negative result must be combined with clinical observations, patient history, and epidemiological information. The expected result is Negative.  Fact Sheet for Patients:  PinkCheek.be  Fact Sheet for Healthcare Providers:    GravelBags.it  This test is no t yet approved or cleared by the [place] FDA and  has been authorized for detection and/or diagnosis of SARS-CoV-2 by FDA under an Emergency Use Authorization (EUA). This EUA will remain  in effect (meaning this test can be used) for the duration of the COVID-19 declaration under Section 564(b)(1) of the Act, 21 U.S.C. section 360bbb-3(b)(1), unless the authorization is terminated or revoked sooner.     Influenza A by PCR NEGATIVE NEGATIVE Final   Influenza B by PCR NEGATIVE NEGATIVE Final    Comment: (NOTE) The Xpert Xpress SARS-CoV-2/FLU/RSV assay is intended as an aid in  the diagnosis of influenza from Nasopharyngeal swab specimens and  should not be used as a sole basis for treatment. Nasal washings and  aspirates are unacceptable for Xpert Xpress SARS-CoV-2/FLU/RSV  testing.  Fact Sheet for Patients: PinkCheek.be  Fact Sheet for Healthcare Providers: GravelBags.it  This test is not yet approved or cleared by the [place] FDA and  has been authorized for detection and/or diagnosis of SARS-CoV-2 by  FDA under an Emergency Use Authorization (EUA). This EUA will remain  in effect (meaning this test can be used) for the duration of the  Covid-19 declaration under Section 564(b)(1) of the Act, 21  U.S.C. section 360bbb-3(b)(1), unless the authorization is  terminated or revoked.    Respiratory Syncytial Virus by PCR NEGATIVE NEGATIVE Final    Comment: (NOTE) Fact Sheet for Patients: PinkCheek.be  Fact Sheet for Healthcare Providers: GravelBags.it  This test is not yet approved or cleared by the [place] FDA and  has been authorized for detection and/or diagnosis of SARS-CoV-2 by  FDA under an Emergency Use Authorization (EUA). This EUA will remain  in effect (meaning this test can be  used) for the duration of the  COVID-19 declaration under Section 564(b)(1) of the Act, 21 U.S.C.  section 360bbb-3(b)(1), unless the authorization is terminated or  revoked. Performed at [facility], [address], [place], [place] [address]          Radiology Studies: No results found.      Scheduled Meds: . furosemide  40 mg Oral Daily  . gabapentin  300 mg Oral Daily  . isosorbide mononitrate  60 mg Oral Daily  . levothyroxine  112 mcg Oral q morning - 10a  . linaclotide  145 mcg Oral Daily  . losartan  25 mg Oral Daily  . metoprolol succinate  12.5 mg Oral Daily  . Ensure Max Protein  11 oz Oral BID  . rosuvastatin  20  mg Oral Daily  . sodium chloride flush  3 mL Intravenous Q12H  . warfarin  7.5 mg Oral ONCE-1600  . Warfarin - Pharmacist Dosing Inpatient   Does not apply q1600   Continuous Infusions: . heparin 1,050 Units/hr (05/18/20 1920)     LOS: 2 days    Time spent: 25 mins.More than 50% of that time was spent in counseling and/or coordination of care.      Shelly Coss, MD [facility] P10/19/2021, 8:11 AM

## 2020-05-19 NOTE — Progress Notes (Signed)
Progress Note  Patient Name: Jocelyn [name] Date of Encounter: 05/19/2020  [facility] Cardiologist: Larae Grooms, MD   Subjective   Had episode of left sided flank pain last night requiring morphine x2. Feels much better now. Hoping to go home soon.  INR 1.4 today Inpatient Medications    Scheduled Meds: . furosemide  40 mg Oral Daily  . gabapentin  300 mg Oral Daily  . isosorbide mononitrate  60 mg Oral Daily  . levothyroxine  112 mcg Oral q morning - 10a  . linaclotide  145 mcg Oral Daily  . losartan  25 mg Oral Daily  . metoprolol succinate  12.5 mg Oral Daily  . Ensure Max Protein  11 oz Oral BID  . rosuvastatin  20 mg Oral Daily  . sodium chloride flush  3 mL Intravenous Q12H  . warfarin  7.5 mg Oral ONCE-1600  . Warfarin - Pharmacist Dosing Inpatient   Does not apply q1600   Continuous Infusions: . heparin 1,050 Units/hr (05/18/20 1920)   PRN Meds: acetaminophen **OR** acetaminophen, albuterol, cyclobenzaprine, docusate sodium, HYDROcodone-acetaminophen, morphine injection   Vital Signs    Vitals:   05/18/20 2132 05/19/20 0603 05/19/20 1130 05/19/20 1554  BP: 101/73 91/68  125/78  Pulse: 80 76  65  Resp: 16 16 17 20   Temp: 97.9 F (36.6 C) 97.7 F (36.5 C)  98.5 F (36.9 C)  TempSrc: Oral Oral  Oral  SpO2: 96% 98%  98%  Weight:      Height:        Intake/Output Summary (Last 24 hours) at 05/19/2020 1609 Last data filed at 05/19/2020 1025 Gross per 24 hour  Intake 360 ml  Output 1000 ml  Net -640 ml   Last 3 Weights 05/16/2020 05/15/2020 05/15/2020  Weight (lbs) 211 lb 6.7 oz 207 lb 223 lb 1.7 oz  Weight (kg) 95.9 kg 93.895 kg 101.2 kg      Telemetry    Sinus with PVCs - Personally Reviewed  ECG    No new ECG tracing today. - Personally Reviewed  Physical Exam   GEN: No acute distress.   Neck: No JVD Cardiac: RRR. No murmurs, rubs, or gallops.  Respiratory: Clear to auscultation bilaterally. GI: Soft, non-tender,  non-distended. Bowel sounds present. MS: No lower extremity edema. No deformity. Left flank without rash and non-tender to palpation Skin: Warm and dry. No edema Neuro:  No focal deficits. Psych: Normal affect.  Labs    High Sensitivity Troponin:   Recent Labs  Lab 05/16/20 0820 05/16/20 1058  TROPONINIHS 113* 123*      Chemistry Recent Labs  Lab 05/15/20 1650 05/15/20 1650 05/16/20 0820 05/16/20 0820 05/17/20 1024 05/18/20 0546 05/19/20 0519  NA 132*   < > 135   < > 135 139 138  K 3.4*   < > 3.6   < > 3.6 3.6 3.6  CL 96*   < > 100   < > 98 101 100  CO2 23   < > 24   < > 24 26 26   GLUCOSE 93   < > 117*   < > 107* 116* 110*  BUN 15   < > 12   < > 12 16 14   CREATININE 1.10*   < > 0.98   < > 1.02* 0.98 0.82  CALCIUM 9.4   < > 9.0   < > 9.1 9.6 9.4  PROT 7.9  --  7.1  --  6.6  --   --  ALBUMIN 3.8  --  3.4*  --  3.1*  --   --   AST 32  --  23  --  22  --   --   ALT 23  --  20  --  18  --   --   ALKPHOS 48  --  43  --  43  --   --   BILITOT 0.5  --  0.7  --  0.7  --   --   GFRNONAA 55*   < > >60   < > >60 >60 >60  ANIONGAP 13   < > 11   < > 13 12 12    < > = values in this interval not displayed.     Hematology Recent Labs  Lab 05/17/20 0708 05/18/20 0546 05/19/20 0519  WBC 6.6 6.8 7.6  RBC 4.37 4.42 4.51  HGB 13.3 13.2 13.5  HCT 38.8 38.6 39.4  MCV 88.8 87.3 87.4  MCH 30.4 29.9 29.9  MCHC 34.3 34.2 34.3  RDW 12.9 13.0 13.1  PLT 226 222 249    BNPNo results for input(s): BNP, PROBNP in the last 168 hours.   DDimer No results for input(s): DDIMER in the last 168 hours.   Radiology    No results found.  Cardiac Studies   Echocardiogram 05/16/2020: Study Conclusions:  - Left ventricle: The cavity size was normal. Wall thickness was  normal. Systolic function was normal. The estimated ejection  fraction was in the range of 55% to 60%. Wall motion was normal;  there were no regional wall motion abnormalities. Doppler  parameters are  consistent with abnormal left ventricular  relaxation (grade 1 diastolic dysfunction). The E/e&' ratio is <8,  suggesting normal LV filling pressure.  - Left atrium: The atrium was normal in size.   Impressions:  - LVEF 55-60%, normal wall thickness, normal wall motion, diastolic  dysfunction, normal LV filling pressure, normal LA size.   Patient Profile     59 y.o. female with a history of CAD with prior stenting to OM1 with 100% in-stent restenosis on last cath in 2017 treated medically, chronic diastolic CHF, TIA, hypertension, hyperlipidemia, and CKD stage II-III who is been seen for evaluation of newly diagnosed LV thrombus and CHF with reduced EF of 40-45% at the request of Dr. Kyung Bacca.  Assessment & Plan    #LV thrombus: Patient initially presented with abdominal pain, nausea and vomiting found to have renal and splenic infarcts on CT with concern with cardioembolic source. Subsequent TTE revealed LV thrombus and mildly reduced LVEF. Apex does not appear to be akinetic based on limited views, however, patient has risk factors for clot formation including HRT and tobacco abuse.  -Continue heparin gtt -Continue warfarin; can discharge home once INR therapeutic -Will follow-up with our anticoagulation clinic; anticipate 63month course of warfarin pending repeat TTE without thrombus at that time -Patient is willing to stop both HRT and tobacco use as she knows this likely is the underlying cause of thrombus -No history of clotting disorder/DVT/PE -No signs of active ischemia   #Chronic combined systolic and diastolic HF with LVEF 24-58%: Patient with newly reduced LVEF on TTE compared to 2016. Notably with history of CAD s/p PCI in the past with cath 2017 revealing occluded previously placed stent but otherwise no significant disease. Has been told that pumping function is reduced in the past after MI in [place]. Appears euvolemic currently. -Continue metoprolol 12.5mg   daily -Continue losartan 25mg  daily -Continue lasix  40mg  PO; goal net even as appears euvolemic -Monitor I/Os and daily weights  #Coronary artery disease s/p PCI: Occluded OM1 stent on cath in 2017. No other significant CAD. -Off aspirin given need for warfarin  -Continue imdur -Continue metop 12.5mg  XL and losartan 25mg  daily as detailed above -Continue crestor 20mg   #Prior TIA: -Off ASA as above given need for AC -Continue crestor 20mg  daily  #HTN: Well controlled. -Continue imdur -Continue metop metop XL -Continue losartan 25mg  daily  #HLD: -Crestor as above -Needs repeat lipid panel and LFTs in 6-8 weeks  Plan to discharge home once INR in therapeutic range (goal 2-3). Will follow-up with anticoagulation clinic and in cardiology clinic shortly after discharge. Will continue to follow.   For questions or updates, please contact [place] Please consult [email or web address] for contact info under

## 2020-05-20 DIAGNOSIS — I25119 Atherosclerotic heart disease of native coronary artery with unspecified angina pectoris: Secondary | ICD-10-CM

## 2020-05-20 LAB — CBC
HCT: 38.7 % (ref 36.0–46.0)
Hemoglobin: 13.3 g/dL (ref 12.0–15.0)
MCH: 30.4 pg (ref 26.0–34.0)
MCHC: 34.4 g/dL (ref 30.0–36.0)
MCV: 88.4 fL (ref 80.0–100.0)
Platelets: 274 10*3/uL (ref 150–400)
RBC: 4.38 MIL/uL (ref 3.87–5.11)
RDW: 13.1 % (ref 11.5–15.5)
WBC: 7.1 10*3/uL (ref 4.0–10.5)
nRBC: 0 % (ref 0.0–0.2)

## 2020-05-20 LAB — PROTIME-INR
INR: 1.7 — ABNORMAL HIGH (ref 0.8–1.2)
Prothrombin Time: 19.2 seconds — ABNORMAL HIGH (ref 11.4–15.2)

## 2020-05-20 LAB — HEPARIN LEVEL (UNFRACTIONATED): Heparin Unfractionated: 0.59 IU/mL (ref 0.30–0.70)

## 2020-05-20 MED ORDER — WARFARIN SODIUM 5 MG PO TABS
7.5000 mg | ORAL_TABLET | Freq: Once | ORAL | Status: AC
  Administered 2020-05-20: 7.5 mg via ORAL
  Filled 2020-05-20: qty 1

## 2020-05-20 NOTE — Care Management Important Message (Signed)
Important Message  Patient Details IM Letter given to the Patient Name: Jocelyn [name] MRN: [id number] Date of Birth: Feb 06, 1961   Medicare Important Message Given:  Yes     Kerin Salen 05/20/2020, 10:59 AM

## 2020-05-20 NOTE — Progress Notes (Signed)
Progress Note  Patient Name: Jocelyn [name] Date of Encounter: 05/20/2020  [facility] Cardiologist: Larae Grooms, MD   Subjective  Doing very well. Awaiting INR to become therapeutic. Anxious to leave the hospital.  Inpatient Medications    Scheduled Meds: . furosemide  40 mg Oral Daily  . gabapentin  300 mg Oral Daily  . isosorbide mononitrate  60 mg Oral Daily  . levothyroxine  112 mcg Oral q morning - 10a  . linaclotide  145 mcg Oral Daily  . losartan  25 mg Oral Daily  . metoprolol succinate  12.5 mg Oral Daily  . Ensure Max Protein  11 oz Oral BID  . rosuvastatin  20 mg Oral Daily  . sodium chloride flush  3 mL Intravenous Q12H  . warfarin  7.5 mg Oral ONCE-1600  . Warfarin - Pharmacist Dosing Inpatient   Does not apply q1600   Continuous Infusions: . heparin 1,050 Units/hr (05/20/20 0200)   PRN Meds: acetaminophen **OR** acetaminophen, albuterol, cyclobenzaprine, docusate sodium, HYDROcodone-acetaminophen, morphine injection   Vital Signs    Vitals:   05/19/20 1554 05/19/20 2003 05/20/20 0514 05/20/20 1500  BP: 125/78 94/63 103/67 105/70  Pulse: 65 79 72 63  Resp: 20 17 17 16   Temp: 98.5 F (36.9 C) 97.8 F (36.6 C) 98 F (36.7 C) 97.9 F (36.6 C)  TempSrc: Oral Oral Oral Oral  SpO2: 98% 100% 97% 99%  Weight:      Height:        Intake/Output Summary (Last 24 hours) at 05/20/2020 1516 Last data filed at 05/20/2020 0200 Gross per 24 hour  Intake 73.5 ml  Output --  Net 73.5 ml   Last 3 Weights 05/16/2020 05/15/2020 05/15/2020  Weight (lbs) 211 lb 6.7 oz 207 lb 223 lb 1.7 oz  Weight (kg) 95.9 kg 93.895 kg 101.2 kg      Telemetry    NSR with PVCs - Personally Reviewed  ECG    No new tracing - Personally Reviewed  Physical Exam   GEN: Very pleasant. Comfortable   Neck: No JVD Cardiac: RRR. No murmurs, rubs, or gallops.  Respiratory: Clear to auscultation bilaterally. GI: Soft, ND, NTTP MS: Warm, trace pedal edema Skin: Warm  and dry.  Neuro:  No focal deficits. Psych: Normal affect.  Labs    High Sensitivity Troponin:   Recent Labs  Lab 05/16/20 0820 05/16/20 1058  TROPONINIHS 113* 123*      Chemistry Recent Labs  Lab 05/15/20 1650 05/15/20 1650 05/16/20 0820 05/16/20 0820 05/17/20 1024 05/18/20 0546 05/19/20 0519  NA 132*   < > 135   < > 135 139 138  K 3.4*   < > 3.6   < > 3.6 3.6 3.6  CL 96*   < > 100   < > 98 101 100  CO2 23   < > 24   < > 24 26 26   GLUCOSE 93   < > 117*   < > 107* 116* 110*  BUN 15   < > 12   < > 12 16 14   CREATININE 1.10*   < > 0.98   < > 1.02* 0.98 0.82  CALCIUM 9.4   < > 9.0   < > 9.1 9.6 9.4  PROT 7.9  --  7.1  --  6.6  --   --   ALBUMIN 3.8  --  3.4*  --  3.1*  --   --   AST 32  --  23  --  22  --   --   ALT 23  --  20  --  18  --   --   ALKPHOS 48  --  43  --  43  --   --   BILITOT 0.5  --  0.7  --  0.7  --   --   GFRNONAA 55*   < > >60   < > >60 >60 >60  ANIONGAP 13   < > 11   < > 13 12 12    < > = values in this interval not displayed.     Hematology Recent Labs  Lab 05/18/20 0546 05/19/20 0519 05/20/20 0523  WBC 6.8 7.6 7.1  RBC 4.42 4.51 4.38  HGB 13.2 13.5 13.3  HCT 38.6 39.4 38.7  MCV 87.3 87.4 88.4  MCH 29.9 29.9 30.4  MCHC 34.2 34.3 34.4  RDW 13.0 13.1 13.1  PLT 222 249 274    BNPNo results for input(s): BNP, PROBNP in the last 168 hours.   DDimer No results for input(s): DDIMER in the last 168 hours.   Radiology    No results found.  Cardiac Studies   Echocardiogram 05/16/2020: Study Conclusions:  - Left ventricle: The cavity size was normal. Wall thickness was  normal. Systolic function was normal. The estimated ejection  fraction was in the range of 55% to 60%. Wall motion was normal;  there were no regional wall motion abnormalities. Doppler  parameters are consistent with abnormal left ventricular  relaxation (grade 1 diastolic dysfunction). The E/e&' ratio is <8,  suggesting normal LV filling pressure.  -  Left atrium: The atrium was normal in size.   Impressions:  - LVEF 55-60%, normal wall thickness, normal wall motion, diastolic  dysfunction, normal LV filling pressure, normal LA size.   Patient Profile     59 y.o. female with a history of CAD with prior stenting to OM1 with 100% in-stent restenosis on last cath in 2017 treated medically, chronic diastolic CHF, TIA, hypertension, hyperlipidemia, and CKD stage II-III who is been seen for evaluation of newly diagnosed LV thrombus and CHF with reduced EF of 40-45%.  Assessment & Plan    #LV thrombus: Patient initially presented with abdominal pain, nausea and vomiting found to have renal and splenic infarcts on CT with concern with cardioembolic source. Subsequent TTE revealed LV thrombus and mildly reduced LVEF. Apex does not appear to be akinetic based on limited views, however, patient has risk factors for clot formation including HRT and tobacco abuse.  -Continue heparin gtt; INR near therapeutic -Continue warfarin; can discharge home once INR therapeutic-->hopefully tomorrow -Patient is willing to stop both HRT and tobacco use as she knows this likely is the underlying cause of thrombus -Will follow-up with our anticoagulation clinic; anticipate 71month course of warfarin pending repeat TTE without thrombus at that time  #Chronic combined systolic and diastolic HF with LVEF 10-25%: Patient with newly reduced LVEF on TTE compared to 2016. Notably with history of CAD s/p PCI in the past with cath 2017 revealing occluded previously placed stent but otherwise no significant disease. Has been told that pumping function is reduced in the past after MI in [place]. Appears euvolemic currently. -Continue metoprolol 12.5mg  daily -Continue losartan 25mg  daily -Continue lasix 40mg  PO -Monitor I/Os and daily weights  #Coronary artery disease s/p PCI: Occluded OM1 stent on cath in 2017. No other significant CAD. -Off aspirin given need for  warfarin  -Continue imdur -Continue metop  12.5mg  XL and losartan 25mg  daily as detailed above -Continue crestor 20mg   #Prior TIA: -Off ASA as above given need for [facility] -Continue crestor 20mg  daily  #HTN: Well controlled. -Continue imdur -Continue metop metop XL -Continue losartan 25mg  daily  #HLD: -Crestor as above -Needs repeat lipid panel and LFTs in 6-8 weeks post discharge.  Plan to discharge home once INR in therapeutic range (goal 2-3)-->hopefully tomorrow. Will follow-up with anticoagulation clinic and in cardiology clinic shortly after discharge. We will arrange this for her prior to discharge.    For questions or updates, please contact [place] Please consult [email or web address] for contact info under

## 2020-05-20 NOTE — Progress Notes (Signed)
[place] for heparin/warfarin Indication: renal and splenic infarcts + LV thrombus  Allergies  Allergen Reactions  . Hydrocodone Nausea Only    Has problems with high doses    Patient Measurements: Height: 5\' 4"  (162.6 cm) Weight: 95.9 kg (211 lb 6.7 oz) IBW/kg (Calculated) : 54.7 Heparin Dosing Weight: 78 kg  Vital Signs: Temp: 98 F (36.7 C) (10/20 0514) Temp Source: Oral (10/20 0514) BP: 103/67 (10/20 0514) Pulse Rate: 72 (10/20 0514)  Labs: Recent Labs    05/17/20 1024 05/17/20 1024 05/18/20 0546 05/18/20 0546 05/19/20 0519 05/20/20 0523  HGB  --   --  13.2   < > 13.5 13.3  HCT  --   --  38.6  --  39.4 38.7  PLT  --   --  222  --  249 274  LABPROT 14.6   < > 13.9  --  16.4* 19.2*  INR 1.2   < > 1.1  --  1.4* 1.7*  HEPARINUNFRC  --   --  0.54  --  0.59 0.59  CREATININE 1.02*  --  0.98  --  0.82  --    < > = values in this interval not displayed.    Estimated Creatinine Clearance: 84.1 mL/min (by C-G formula based on SCr of 0.82 mg/dL).   Assessment: 59 y/o F presented to the ED on 10/15 with c/o abdominal pain and n/v. Abdominal CT showed findings with concern for splenic and renal infarcts. Echo on 10/16 showed LV apical thrombus. Heparin infusion started on 10/16 and warfarin started on 10/17.  Today, 05/20/2020: - Day #4 of at least 5 days overlap  - Heparin level remains therapeutic at 0.59 units/mL on heparin infusion at 1050 units/hr - INR is subtherapeutic at 1.7 but increasing - CBC stable - No bleeding or infusion issues per nursing - No significant drug-drug interactions with warfarin noted  Goal of Therapy:  INR 2-3 Heparin level 0.3-0.7 units/ml Monitor platelets by anticoagulation protocol: Yes   Plan:  - Continue heparin infusion at 1050 units/hr - Warfarin 7.5 mg PO x 1 today - Daily heparin level, PT/INR, and CBC - Monitor closely for s/sx bleeding   Lindell Spar, PharmD,  BCPS [phone] 05/20/2020 9:49 AM

## 2020-05-20 NOTE — Progress Notes (Signed)
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  Imagings done in the emergency department showed acute splenic/renal infarcts with concern for cardioembolic stroke.  TTE showed ejection fraction of 40 to 45%, left ventricular apical thrombus.  Cardiology consulted.  Started on anticoagulation.  Currently on heparin and warfarin bridge.  Likely discharge tomorrow with INR around 2.  Assessment & Plan:   Principal Problem:   Splenic infarct Active Problems:   CAD (coronary artery disease), native coronary artery   Hypertension   Hypercholesterolemia   Tobacco abuse   Aortic atherosclerosis (HCC)   Hypothyroidism   Renal infarct ([place])   Hypoxia   Left ventricular apical thrombus/splenic/renal infarct: Presented with abdominal pain.  Imagings showed a splenic/renal infarct suspicion for cardiomegaly stroke.  Echo showed left ventricular thrombus.  Cardiology was consulted and following.  Started on heparin drip and warfarin.  Plan is to continue to bridge until INR is around 2.  Goal INR should be 2-3.  She does not have any history of clotting disorder, DVT or PE in the past.  Abdominal pain: Secondary to splenic/renal infarcts.  Improved  Chronic combined systolic/diastolic congestive heart failure: Echo done here showed ejection fraction of 40 to 45%.  Currently she is euvolemic.  On metoprolol.  Also started on losartan 25 mg.  Continue Lasix 40 mg daily.  Monitor daily weight,  input/output.  Coronary artery disease: Status post PCI.  She was taking aspirin at home which has been discontinued.  Continue Imdur, metoprolol.  Started on Crestor 20 mg daily.  History of TIA: On aspirin at home which will be stopped now.  Started on warfarin.  Continue Crestor  Hypertension: Blood pressure is stable but soft.  Continue Imdur, metoprolol,losartan.  Monitor blood pressure  Hyperlipidemia: Start on Crestor  Hypothyroidism: Continue Synthyroid.  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    Nutrition Problem: Increased nutrient needs Etiology: chronic illness (CHF, COPD)      DVT prophylaxis:Warfarin,heparin Code Status: Full Family Communication: None at bed side Status is: Inpatient  Remains inpatient appropriate because:Unsafe d/c plan   Dispo: The patient is from: Home              Anticipated d/c is to: Home              Anticipated d/c date is: 1 day              Patient currently is not medically stable to d/c.  INR should be at least 2 before discharge.   Consultants: Cardiology  Procedures:None  Antimicrobials:  Anti-infectives (From admission, onward)   None      Subjective:  Patient seen and examined at the bedside this morning.  Comfortable.  hemodynamically stable.  She had some left flank pain last night but not during my evaluation.  Objective: Vitals:   05/19/20 1130 05/19/20 1554 05/19/20 2003 05/20/20 0514  BP:  125/78 94/63 103/67  Pulse:  65 79 72  Resp: 17 20 17 17   Temp:  98.5 F (36.9 C) 97.8 F (36.6 C)  98 F (36.7 C)  TempSrc:  Oral Oral Oral  SpO2:  98% 100% 97%  Weight:      Height:        Intake/Output Summary (Last 24 hours) at 05/20/2020 0800 Last data filed at 05/20/2020 0200 Gross per 24 hour  Intake 673.5 ml  Output 400 ml  Net 273.5 ml   Filed Weights   05/15/20 1637 05/15/20 1639 05/16/20 0407  Weight: 101.2 kg 93.9  kg 95.9 kg    Examination:   General exam: Appears calm and comfortable ,Not in distress,average built HEENT:PERRL,Oral mucosa moist, Ear/Nose normal on gross exam Respiratory system: Bilateral equal air entry, normal vesicular breath sounds, no wheezes or crackles  Cardiovascular system: S1 & S2 heard, RRR. No JVD, murmurs, rubs, gallops or clicks. Gastrointestinal system: Abdomen is nondistended, soft and nontender. No organomegaly or masses felt. Normal bowel sounds heard. Central nervous system: Alert and oriented. No focal neurological deficits. Extremities: No edema, no clubbing ,no cyanosis, distal peripheral pulses palpable. Skin: No rashes, lesions or ulcers,no icterus ,no pallor    Data Reviewed: I have personally reviewed following labs and imaging studies  CBC: Recent Labs  Lab 05/16/20 0820 05/17/20 0708 05/18/20 0546 05/19/20 0519 05/20/20 0523  WBC 8.8 6.6 6.8 7.6 7.1  NEUTROABS 5.7  --   --   --   --   HGB 13.4 13.3 13.2 13.5 13.3  HCT 39.1 38.8 38.6 39.4 38.7  MCV 88.3 88.8 87.3 87.4 88.4  PLT 214 226 222 249 295   Basic Metabolic Panel: Recent Labs  Lab 05/15/20 1650 05/16/20 0820 05/17/20 1024 05/18/20 0546 05/19/20 0519  NA 132* 135 135 139 138  K 3.4* 3.6 3.6 3.6 3.6  CL 96* 100 98 101 100  CO2 23 24 24 26 26   GLUCOSE 93 117* 107* 116* 110*  BUN 15 12 12 16 14   CREATININE 1.10* 0.98 1.02* 0.98 0.82  CALCIUM 9.4 9.0 9.1 9.6 9.4  MG  --   --  1.8 1.8 1.8   GFR: Estimated Creatinine Clearance: 84.1 mL/min (by C-G formula based on SCr of 0.82 mg/dL). Liver Function Tests: Recent Labs  Lab 05/15/20 1650 05/16/20 0820 05/17/20 1024  AST 32 23 22  ALT 23 20 18   ALKPHOS 48 43 43  BILITOT 0.5 0.7 0.7  PROT 7.9 7.1 6.6  ALBUMIN 3.8 3.4* 3.1*   Recent Labs  Lab 05/15/20 1650  LIPASE 22   No results for input(s): AMMONIA in the last 168 hours. Coagulation Profile: Recent Labs  Lab 05/17/20 1024 05/18/20 0546 05/19/20 0519  05/20/20 0523  INR 1.2 1.1 1.4* 1.7*   Cardiac Enzymes: No results for input(s): CKTOTAL, CKMB, CKMBINDEX, TROPONINI in the last 168 hours. BNP (last 3 results) No results for input(s): PROBNP in the last 8760 hours. HbA1C: No results for input(s): HGBA1C in the last 72 hours. CBG: No results for input(s): GLUCAP in the last 168 hours. Lipid Profile: No results for input(s): CHOL, HDL, LDLCALC, TRIG, CHOLHDL, LDLDIRECT in the last 72 hours. Thyroid Function Tests: No results for input(s): TSH, T4TOTAL, FREET4, T3FREE, THYROIDAB in the last 72 hours. Anemia Panel: No results for input(s): VITAMINB12, FOLATE, FERRITIN, TIBC, IRON, RETICCTPCT in the last 72 hours. Sepsis Labs: Recent Labs  Lab 05/16/20 0820 05/16/20 1058  LATICACIDVEN 0.9 1.2    Recent Results (from the past 240 hour(s))  Resp Panel by RT PCR (RSV, Flu A&B, Covid) - Nasopharyngeal Swab     Status: None  Collection Time: 05/16/20  1:02 AM   Specimen: Nasopharyngeal Swab  Result Value Ref Range Status   SARS Coronavirus 2 by RT PCR NEGATIVE NEGATIVE Final    Comment: (NOTE) SARS-CoV-2 target nucleic acids are NOT DETECTED.  The SARS-CoV-2 RNA is generally detectable in upper respiratoy specimens during the acute phase of infection. The lowest concentration of SARS-CoV-2 viral copies this assay can detect is 131 copies/mL. A negative result does not preclude SARS-Cov-2 infection and should not be used as the sole basis for treatment or other patient management decisions. A negative result may occur with  improper specimen collection/handling, submission of specimen other than nasopharyngeal swab, presence of viral mutation(s) within the areas targeted by this assay, and inadequate number of viral copies (<131 copies/mL). A negative result must be combined with clinical observations, patient history, and epidemiological information. The expected result is Negative.  Fact Sheet for Patients:   PinkCheek.be  Fact Sheet for Healthcare Providers:  GravelBags.it  This test is no t yet approved or cleared by the [place] FDA and  has been authorized for detection and/or diagnosis of SARS-CoV-2 by FDA under an Emergency Use Authorization (EUA). This EUA will remain  in effect (meaning this test can be used) for the duration of the COVID-19 declaration under Section 564(b)(1) of the Act, 21 U.S.C. section 360bbb-3(b)(1), unless the authorization is terminated or revoked sooner.     Influenza A by PCR NEGATIVE NEGATIVE Final   Influenza B by PCR NEGATIVE NEGATIVE Final    Comment: (NOTE) The Xpert Xpress SARS-CoV-2/FLU/RSV assay is intended as an aid in  the diagnosis of influenza from Nasopharyngeal swab specimens and  should not be used as a sole basis for treatment. Nasal washings and  aspirates are unacceptable for Xpert Xpress SARS-CoV-2/FLU/RSV  testing.  Fact Sheet for Patients: PinkCheek.be  Fact Sheet for Healthcare Providers: GravelBags.it  This test is not yet approved or cleared by the [place] FDA and  has been authorized for detection and/or diagnosis of SARS-CoV-2 by  FDA under an Emergency Use Authorization (EUA). This EUA will remain  in effect (meaning this test can be used) for the duration of the  Covid-19 declaration under Section 564(b)(1) of the Act, 21  U.S.C. section 360bbb-3(b)(1), unless the authorization is  terminated or revoked.    Respiratory Syncytial Virus by PCR NEGATIVE NEGATIVE Final    Comment: (NOTE) Fact Sheet for Patients: PinkCheek.be  Fact Sheet for Healthcare Providers: GravelBags.it  This test is not yet approved or cleared by the [place] FDA and  has been authorized for detection and/or diagnosis of SARS-CoV-2 by  FDA under an Emergency  Use Authorization (EUA). This EUA will remain  in effect (meaning this test can be used) for the duration of the  COVID-19 declaration under Section 564(b)(1) of the Act, 21 U.S.C.  section 360bbb-3(b)(1), unless the authorization is terminated or  revoked. Performed at [facility], [address] [place], [place] [address]          Radiology Studies: No results found.      Scheduled Meds: . furosemide  40 mg Oral Daily  . gabapentin  300 mg Oral Daily  . isosorbide mononitrate  60 mg Oral Daily  . levothyroxine  112 mcg Oral q morning - 10a  . linaclotide  145 mcg Oral Daily  . losartan  25 mg Oral Daily  . metoprolol succinate  12.5 mg Oral Daily  . Ensure Max Protein  11  oz Oral BID  . rosuvastatin  20 mg Oral Daily  . sodium chloride flush  3 mL Intravenous Q12H  . Warfarin - Pharmacist Dosing Inpatient   Does not apply q1600   Continuous Infusions: . heparin 1,050 Units/hr (05/20/20 0200)     LOS: 3 days    Time spent: 25 mins.More than 50% of that time was spent in counseling and/or coordination of care.      Shelly Coss, MD [facility] P10/20/2021, 8:00 AM

## 2020-05-21 LAB — HEPARIN LEVEL (UNFRACTIONATED): Heparin Unfractionated: 0.73 IU/mL — ABNORMAL HIGH (ref 0.30–0.70)

## 2020-05-21 LAB — PROTIME-INR
INR: 2 — ABNORMAL HIGH (ref 0.8–1.2)
Prothrombin Time: 22.3 seconds — ABNORMAL HIGH (ref 11.4–15.2)

## 2020-05-21 MED ORDER — HYDROCODONE-ACETAMINOPHEN 5-325 MG PO TABS: 1.0000 | ORAL_TABLET | Freq: Four times a day (QID) | ORAL | 0 refills | Status: DC | PRN

## 2020-05-21 MED ORDER — METOPROLOL SUCCINATE ER 25 MG PO TB24
12.5000 mg | ORAL_TABLET | Freq: Every day | ORAL | 1 refills | Status: DC
Start: 2020-05-21 — End: 2021-05-21

## 2020-05-21 MED ORDER — WARFARIN SODIUM 5 MG PO TABS: 7.5000 mg | ORAL_TABLET | Freq: Once | ORAL | Status: DC

## 2020-05-21 MED ORDER — HEPARIN (PORCINE) 25000 UT/250ML-% IV SOLN
950.0000 [IU]/h | INTRAVENOUS | Status: DC
  Filled 2020-05-21: qty 250

## 2020-05-21 MED ORDER — WARFARIN SODIUM 5 MG PO TABS
7.5000 mg | ORAL_TABLET | Freq: Every day | ORAL | 2 refills | Status: DC
Start: 2020-05-21 — End: 2020-08-13

## 2020-05-21 MED ORDER — ROSUVASTATIN CALCIUM 20 MG PO TABS
20.0000 mg | ORAL_TABLET | Freq: Every day | ORAL | 1 refills | Status: DC
Start: 2020-05-21 — End: 2020-07-20

## 2020-05-21 MED ORDER — LOSARTAN POTASSIUM 25 MG PO TABS
25.0000 mg | ORAL_TABLET | Freq: Every day | ORAL | 1 refills | Status: DC
Start: 2020-05-21 — End: 2021-05-16

## 2020-05-21 NOTE — TOC Transition Note (Signed)
Transition of Care [facility]) - CM/SW Discharge Note   Patient Details  Name: Jocelyn [name] MRN: [id number] Date of Birth: February 04, 1961  [facility]) CM/SW Contact:  Lynnell Catalan, RN Phone Number: 05/21/2020, 9:45 AM   Clinical Narrative:     [facility] consult for coumadin clinic set up. This was done by Cardiology and placed on AVS.

## 2020-05-21 NOTE — Discharge Instructions (Signed)
Heart Failure Education: 1. Weigh yourself EVERY morning after you go to the bathroom but before you eat or drink anything. Write this number down in a weight log/diary. If you gain 3 pounds overnight or 5 pounds in a week, call the office. 2. Take your medicines as prescribed. If you have concerns about your medications, please call us before you stop taking them.  3. Eat low salt foods--Limit salt (sodium) to 2000 mg per day. This will help prevent your body from holding onto fluid. Read food labels as many processed foods have a lot of sodium, especially canned goods and prepackaged meats. If you would like some assistance choosing low sodium foods, we would be happy to set you up with a nutritionist. 4. Stay as active as you can everyday. Staying active will give you more energy and make your muscles stronger. Start with 5 minutes at a time and work your way up to 30 minutes a day. Break up your activities--do some in the morning and some in the afternoon. Start with 3 days per week and work your way up to 5 days as you can.  If you have chest pain, feel short of breath, dizzy, or lightheaded, STOP. If you don't feel better after a short rest, call 911. If you do feel better, call the office to let us know you have symptoms with exercise. 5. Limit all fluids for the day to less than 2 liters. Fluid includes all drinks, coffee, juice, ice chips, soup, jello, and all other liquids.     Information on my medicine - Coumadin   (Warfarin)  This medication education was reviewed with me or my healthcare representative as part of my discharge preparation.   Why was Coumadin prescribed for you? Coumadin was prescribed for you because you have a blood clot or a medical condition that can cause an increased risk of forming blood clots. Blood clots can cause serious health problems by blocking the flow of blood to the heart, lung, or brain. Coumadin can prevent harmful blood clots from forming. As a  reminder your indication for Coumadin is:   Clot in kidney, spleen and heart  What test will check on my response to Coumadin? While on Coumadin (warfarin) you will need to have an INR test regularly to ensure that your dose is keeping you in the desired range. The INR (international normalized ratio) number is calculated from the result of the laboratory test called prothrombin time (PT).  If an INR APPOINTMENT HAS NOT ALREADY BEEN MADE FOR YOU please schedule an appointment to have this lab work done by your health care provider within 7 days. Your INR goal is usually a number between:  2 to 3 or your provider may give you a more narrow range like 2-2.5.  Ask your health care provider during an office visit what your goal INR is.  What  do you need to  know  About  COUMADIN? Take Coumadin (warfarin) exactly as prescribed by your healthcare provider about the same time each day.  DO NOT stop taking without talking to the doctor who prescribed the medication.  Stopping without other blood clot prevention medication to take the place of Coumadin may increase your risk of developing a new clot or stroke.  Get refills before you run out.  What do you do if you miss a dose? If you miss a dose, take it as soon as you remember on the same day then continue your regularly scheduled regimen  the next day.  Do not take two doses of Coumadin at the same time.  Important Safety Information A possible side effect of Coumadin (Warfarin) is an increased risk of bleeding. You should call your healthcare provider right away if you experience any of the following: ? Bleeding from an injury or your nose that does not stop. ? Unusual colored urine (red or dark brown) or unusual colored stools (red or black). ? Unusual bruising for unknown reasons. ? A serious fall or if you hit your head (even if there is no bleeding).  Some foods or medicines interact with Coumadin (warfarin) and might alter your response to  warfarin. To help avoid this: ? Eat a balanced diet, maintaining a consistent amount of Vitamin K. ? Notify your provider about major diet changes you plan to make. ? Avoid alcohol or limit your intake to 1 drink for women and 2 drinks for men per day. (1 drink is 5 oz. wine, 12 oz. beer, or 1.5 oz. liquor.)  Make sure that ANY health care provider who prescribes medication for you knows that you are taking Coumadin (warfarin).  Also make sure the healthcare provider who is monitoring your Coumadin knows when you have started a new medication including herbals and non-prescription products.  Coumadin (Warfarin)  Major Drug Interactions  Increased Warfarin Effect Decreased Warfarin Effect  Alcohol (large quantities) Antibiotics (esp. Septra/Bactrim, Flagyl, Cipro) Amiodarone (Cordarone) Aspirin (ASA) Cimetidine (Tagamet) Megestrol (Megace) NSAIDs (ibuprofen, naproxen, etc.) Piroxicam (Feldene) Propafenone (Rythmol SR) Propranolol (Inderal) Isoniazid (INH) Posaconazole (Noxafil) Barbiturates (Phenobarbital) Carbamazepine (Tegretol) Chlordiazepoxide (Librium) Cholestyramine (Questran) Griseofulvin Oral Contraceptives Rifampin Sucralfate (Carafate) Vitamin K   Coumadin (Warfarin) Major Herbal Interactions  Increased Warfarin Effect Decreased Warfarin Effect  Garlic Ginseng Ginkgo biloba Coenzyme Q10 Green tea St. John's wort    Coumadin (Warfarin) FOOD Interactions  Eat a consistent number of servings per week of foods HIGH in Vitamin K (1 serving =  cup)  Collards (cooked, or boiled & drained) Kale (cooked, or boiled & drained) Mustard greens (cooked, or boiled & drained) Parsley *serving size only =  cup Spinach (cooked, or boiled & drained) Swiss chard (cooked, or boiled & drained) Turnip greens (cooked, or boiled & drained)  Eat a consistent number of servings per week of foods MEDIUM-HIGH in Vitamin K (1 serving = 1 cup)  Asparagus (cooked, or boiled &  drained) Broccoli (cooked, boiled & drained, or raw & chopped) Brussel sprouts (cooked, or boiled & drained) *serving size only =  cup Lettuce, raw (green leaf, endive, romaine) Spinach, raw Turnip greens, raw & chopped   These websites have more information on Coumadin (warfarin):  FailFactory.se; VeganReport.com.au;

## 2020-05-21 NOTE — Progress Notes (Signed)
[place] for heparin/warfarin Indication: renal and splenic infarcts + LV thrombus  Allergies  Allergen Reactions  . Hydrocodone Nausea Only    Has problems with high doses    Patient Measurements: Height: 5\' 4"  (162.6 cm) Weight: 95.9 kg (211 lb 6.7 oz) IBW/kg (Calculated) : 54.7 Heparin Dosing Weight: 78 kg  Vital Signs: Temp: 98.2 F (36.8 C) (10/21 0519) Temp Source: Oral (10/21 0519) BP: 114/78 (10/21 0519) Pulse Rate: 81 (10/21 0519)  Labs: Recent Labs    05/19/20 0519 05/20/20 0523 05/21/20 0518  HGB 13.5 13.3  --   HCT 39.4 38.7  --   PLT 249 274  --   LABPROT 16.4* 19.2* 22.3*  INR 1.4* 1.7* 2.0*  HEPARINUNFRC 0.59 0.59 0.73*  CREATININE 0.82  --   --     Estimated Creatinine Clearance: 84.1 mL/min (by C-G formula based on SCr of 0.82 mg/dL).   Assessment: Patient's a 59 y.o F presented to the ED on 10/15 with c/o abdominal pain and n/v.  Abdominal CT showed findings with concern for splenic and renal infarcts. Echo on 10/16 showed LV apical thrombus. Heparin drip started on 10/16 and warfarin started on 10/17.  Today, 05/21/2020: - Day #5 of at least 5 days minimum overlap  - Heparin level supra-therapeutic at 0.73 on heparin infusion at 1050 units/hr - INR is therapeutic at 2 - cbc on 10/20 stable - per pt's RN, no bleeding noted - no significant drug-drug intxns noted  Goal of Therapy:  INR 2-3 Heparin level 0.3-0.7 units/ml Monitor platelets by anticoagulation protocol: Yes   Plan:  - Decrease heparin drip to 950 units/hr. Since today (day #5 of warfarin) is the first day of therapeutic INR, would recommend to continue overlap with heparin drip for another 24 hrs heparin  - check 6 hr heparin level - repeat warfarin 7.5 mg x1 today  - Daily heparin level, PT/INR, & CBC - monitor for s/sx bleeding  Dia Sitter, PharmD, BCPS 05/21/2020 7:02 AM

## 2020-05-21 NOTE — Progress Notes (Addendum)
Progress Note  Patient Name: Jocelyn [name] Date of Encounter: 05/21/2020  Primary Cardiologist: Larae Grooms, MD   Subjective   Looking forward to going home. No new complaints overnight. We discussed her medication regimen going forward. She continues to believe her hospitalization/thrombus were directly related to receiving her 2nd COVID vaccine.   Inpatient Medications    Scheduled Meds: . furosemide  40 mg Oral Daily  . gabapentin  300 mg Oral Daily  . isosorbide mononitrate  60 mg Oral Daily  . levothyroxine  112 mcg Oral q morning - 10a  . linaclotide  145 mcg Oral Daily  . losartan  25 mg Oral Daily  . metoprolol succinate  12.5 mg Oral Daily  . Ensure Max Protein  11 oz Oral BID  . rosuvastatin  20 mg Oral Daily  . sodium chloride flush  3 mL Intravenous Q12H  . warfarin  7.5 mg Oral ONCE-1600  . Warfarin - Pharmacist Dosing Inpatient   Does not apply q1600   Continuous Infusions: . heparin 950 Units/hr (05/21/20 0727)   PRN Meds: acetaminophen **OR** acetaminophen, albuterol, cyclobenzaprine, docusate sodium, HYDROcodone-acetaminophen, morphine injection   Vital Signs    Vitals:   05/20/20 0514 05/20/20 1500 05/20/20 2000 05/21/20 0519  BP: 103/67 105/70 109/86 114/78  Pulse: 72 63 74 81  Resp: 17 16 17 15   Temp: 98 F (36.7 C) 97.9 F (36.6 C) 98 F (36.7 C) 98.2 F (36.8 C)  TempSrc: Oral Oral Oral Oral  SpO2: 97% 99% 96% 97%  Weight:      Height:        Intake/Output Summary (Last 24 hours) at 05/21/2020 0752 Last data filed at 05/21/2020 7425 Gross per 24 hour  Intake 1216.52 ml  Output 3 ml  Net 1213.52 ml   Filed Weights   05/15/20 1637 05/15/20 1639 05/16/20 0407  Weight: 101.2 kg 93.9 kg 95.9 kg    Telemetry    Not on telemetry - Personally Reviewed  ECG    No new tracings - Personally Reviewed  Physical Exam   GEN: No acute distress.   Neck: No JVD, no carotid bruits Cardiac: RRR, no murmurs, rubs, or gallops.    Respiratory: Clear to auscultation bilaterally, no wheezes/ rales/ rhonchi GI: NABS, Soft, nontender, non-distended  MS: No edema; No deformity. Neuro:  Nonfocal, moving all extremities spontaneously Psych: Normal affect   Labs    Chemistry Recent Labs  Lab 05/15/20 1650 05/15/20 1650 05/16/20 0820 05/16/20 0820 05/17/20 1024 05/18/20 0546 05/19/20 0519  NA 132*   < > 135   < > 135 139 138  K 3.4*   < > 3.6   < > 3.6 3.6 3.6  CL 96*   < > 100   < > 98 101 100  CO2 23   < > 24   < > 24 26 26   GLUCOSE 93   < > 117*   < > 107* 116* 110*  BUN 15   < > 12   < > 12 16 14   CREATININE 1.10*   < > 0.98   < > 1.02* 0.98 0.82  CALCIUM 9.4   < > 9.0   < > 9.1 9.6 9.4  PROT 7.9  --  7.1  --  6.6  --   --   ALBUMIN 3.8  --  3.4*  --  3.1*  --   --   AST 32  --  23  --  22  --   --  ALT 23  --  20  --  18  --   --   ALKPHOS 48  --  43  --  43  --   --   BILITOT 0.5  --  0.7  --  0.7  --   --   GFRNONAA 55*   < > >60   < > >60 >60 >60  ANIONGAP 13   < > 11   < > 13 12 12    < > = values in this interval not displayed.     Hematology Recent Labs  Lab 05/18/20 0546 05/19/20 0519 05/20/20 0523  WBC 6.8 7.6 7.1  RBC 4.42 4.51 4.38  HGB 13.2 13.5 13.3  HCT 38.6 39.4 38.7  MCV 87.3 87.4 88.4  MCH 29.9 29.9 30.4  MCHC 34.2 34.3 34.4  RDW 13.0 13.1 13.1  PLT 222 249 274    Cardiac EnzymesNo results for input(s): TROPONINI in the last 168 hours. No results for input(s): TROPIPOC in the last 168 hours.   BNPNo results for input(s): BNP, PROBNP in the last 168 hours.   DDimer No results for input(s): DDIMER in the last 168 hours.   Radiology    No results found.  Cardiac Studies   Echocardiogram 05/16/2020: Study Conclusions:  - Left ventricle: The cavity size was normal. Wall thickness was  normal. Systolic function was normal. The estimated ejection  fraction was in the range of 55% to 60%. Wall motion was normal;  there were no regional wall motion  abnormalities. Doppler  parameters are consistent with abnormal left ventricular  relaxation (grade 1 diastolic dysfunction). The E/e&' ratio is <8,  suggesting normal LV filling pressure.  - Left atrium: The atrium was normal in size.   Impressions:  - LVEF 55-60%, normal wall thickness, normal wall motion, diastolic  dysfunction, normal LV filling pressure, normal LA size.   Patient Profile     59 y.o. female with a history of CAD with prior stenting to OM1 with 100% in-stent restenosis on last cath in 2017 treated medically, chronic diastolic CHF, TIA, hypertension, hyperlipidemia, and CKD stage II-III who is been seen for evaluation of newly diagnosed LV thrombus and CHF with reduced EF of 40-45%.  Assessment & Plan    1. LV thrombus: initially presented with abdominal pain and N/V. Found to have renal and splenic infarcts on CT with c/f cardioembolic source. TTE showed LV thrombus and mildly reduced EF of 40-45%. Felt to be 2/2 HRT and tobacco abuse. Started on coumadin with heparin gtt bridge to therapeutic INR. INR is 2.0 today! - Continue coumadin with goal INR 2-3 - Anticipate repeat echocardiogram in 6 months to evaluate for resolution of thrombus, at which time could consider discontinuation of coumadin - Encourage cessation of HRT and tobacco produces which are suspected to be the cause of her LV thrombus - Will arrange follow-up in our coumadin clinic for Monday - please have pharmacy give recommendations for dosing until that visit.   2. Acute combined CHF: Echo this admission with EF 40-45%, previously normal in 2016. Maintained euvolemic status this admission. Home medications titrated.  - Continue metoprolol succinate 12.5mg  daily - Continue Losartan 25mg  daily - Continue Lasix 25mg  daily - Continue to monitor daily weights and limit salt intake  3. Coronary artery disease s/p PCI to OM1 in 2017: no anginal complaints. Aspirin discontinued given need for  coumadin - Continue statin - Continue metoprolol and imdur   4. HTN: BP stable. Home spironolactone  held this admission - Continue metoprolol, losartan, and imdur  5. HLD: no lipids on file - Continue statin  AVS updated with follow-up appointment information.   For questions or updates, please contact [place] Please consult [email or web address] for contact info under Cardiology/STEMI.      Signed, Abigail Butts, PA-C  05/21/2020, 7:52 AM   [phone]  Patient seen and examined and agree with Roby Lofts, PA.  Doing well. Eager to go home.  GEN: No acute distress.   Neck: No JVD Cardiac: RRR, no murmurs, rubs, or gallops.  Respiratory: Clear to auscultation bilaterally. GI: Soft, nontender, non-distended  MS: No edema; No deformity. Neuro:  Nonfocal  Psych: Normal affect   INR therapeutic at 2. Off heparin gtt. Plan to go home today. Will follow-up in clinic with [place]. Continue current medical regimen on discharge.  Gwyndolyn Kaufman, MD

## 2020-05-21 NOTE — Plan of Care (Signed)
Patient is ready for discharge. Transportation has been arranged and is outside the main hospital entrance. AVS has been discussed in great detail with the patient and all questions answered to include medication questions. Patient provided with education on new meds and advised on what to expect  If she falls or receive a blow to the head. All IV's removed personal belongings returned and patient transported by Probation officer to her car where her daughter is waiting to carry her home. Telemetry Box removed, cleaned, and turned in. Patient Discharged.   Problem: Education: Goal: Knowledge of General Education information will improve Description: Including pain rating scale, medication(s)/side effects and non-pharmacologic comfort measures Outcome: Adequate for Discharge   Problem: Health Behavior/Discharge Planning: Goal: Ability to manage health-related needs will improve Outcome: Adequate for Discharge   Problem: Clinical Measurements: Goal: Ability to maintain clinical measurements within normal limits will improve Outcome: Adequate for Discharge Goal: Will remain free from infection Outcome: Adequate for Discharge Goal: Diagnostic test results will improve Outcome: Adequate for Discharge Goal: Cardiovascular complication will be avoided Outcome: Adequate for Discharge   Problem: Activity: Goal: Risk for activity intolerance will decrease Outcome: Adequate for Discharge   Problem: Nutrition: Goal: Adequate nutrition will be maintained Outcome: Adequate for Discharge   Problem: Elimination: Goal: Will not experience complications related to bowel motility Outcome: Adequate for Discharge Goal: Will not experience complications related to urinary retention Outcome: Adequate for Discharge   Problem: Pain Managment: Goal: General experience of comfort will improve Outcome: Adequate for Discharge   Problem: Safety: Goal: Ability to remain free from injury will improve Outcome: Adequate  for Discharge   Problem: Skin Integrity: Goal: Risk for impaired skin integrity will decrease Outcome: Adequate for Discharge

## 2020-05-21 NOTE — Discharge Summary (Signed)
Physician Discharge Summary  Jocelyn [name] HCW:[id number] DOB: 05/01/1961 DOA: 05/15/2020  PCP: Benito Mccreedy, MD  Admit date: 05/15/2020 Discharge date: 05/21/2020  Admitted From: Home Disposition:  Home  Discharge Condition:Stable CODE STATUS:FULL Diet recommendation: Heart Healthy   Brief/Interim Summary: Patient is a 59 year old female with history of combined systolic/diastolic congestive heart failure, CKD stage II, coronary artery disease, hypertension, hyperlipidemia, tobacco abuse, depression, TIA, thyrotoxicosis status post thyroidectomy now on thyroxine who presented to the emergency department complaints of left-sided abdominal pain with nausea, vomiting and diarrhea.  Imagings done in the emergency department showed acute splenic/renal infarcts with concern for cardioembolic stroke.  TTE showed ejection fraction of 40 to 45%, left ventricular apical thrombus.  Cardiology consulted.  Started on anticoagulation with  heparin and warfarin bridge.  INR is in the range of 2 today and she is ready for discharge.  She will follow-up at Coumadin clinic on Monday.  Following problems were addressed during her hospitalization:  Left ventricular apical thrombus/splenic/renal infarct: Presented with abdominal pain.  Imagings showed a splenic/renal infarct suspicion for cardiomegaly stroke.  Echo showed left ventricular thrombus.  Cardiology was consulted and following.  Started on heparin drip and warfarin.    Goal INR should be 2-3.  She does not have any history of clotting disorder, DVT or PE in the past.  Abdominal pain: Secondary to splenic/renal infarcts. Pain resolved  Chronic combined systolic/diastolic congestive heart failure: Echo done here showed ejection fraction of 40 to 45%.  Currently she is euvolemic.  On metoprolol.  Also started on losartan 25 mg.  Continue Lasix 40 mg daily.    Coronary artery disease: Status post PCI.  She was taking aspirin at home which has  been discontinued.  Continue Imdur, metoprolol.  Started on Crestor 20 mg daily.  History of TIA: On aspirin at home which will be stopped now.  Started on warfarin.  Continue Crestor  Hypertension: Blood pressure is stable but soft.  Continue Imdur, metoprolol,losartan.  Monitor blood pressure  Hyperlipidemia: Start on Crestor  Hypothyroidism: Continue Synthyroid.  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   Discharge Diagnoses:  Principal Problem:   Splenic infarct Active Problems:   CAD (coronary artery disease), native coronary artery   Hypertension   Hypercholesterolemia   Tobacco abuse   Aortic atherosclerosis (HCC)   Hypothyroidism   Renal infarct [facility])   Hypoxia    Discharge Instructions  Discharge Instructions    Diet - low sodium heart healthy   Complete by: As directed    Discharge instructions   Complete by: As directed    1)Please take prescribed medications as instructed 2)Follow up at coumadin clinic on 05/25/20 to check your INR. Continue to monitor your INR 3)Follow up with your PCP in 2 weeks   Increase activity slowly   Complete by: As directed      Allergies as of 05/21/2020      Reactions   Hydrocodone Nausea Only   Has problems with high doses      Medication List    STOP taking these medications   aspirin 325 MG tablet   metroNIDAZOLE 500 MG tablet Commonly known as: FLAGYL   simvastatin 20 MG tablet Commonly known as: ZOCOR   spironolactone 25 MG tablet Commonly known as: ALDACTONE     TAKE these medications   albuterol 108 (90 Base) MCG/ACT inhaler Commonly known as: VENTOLIN HFA Inhale 2 puffs into the lungs every 4 (four)  hours as needed for shortness of breath.   ALPRAZolam 1 MG tablet Commonly known as: XANAX Take 1 tablet by mouth 3 (three) times daily as needed for anxiety.   cyclobenzaprine 10 MG tablet Commonly known as:  FLEXERIL Take 1 tablet (10 mg total) by mouth 3 (three) times daily as needed for muscle spasms.   docusate sodium 100 MG capsule Commonly known as: COLACE Take 100 mg by mouth 2 (two) times daily as needed for constipation.   estradiol 0.5 MG tablet Commonly known as: ESTRACE Take 0.5 mg by mouth daily.   furosemide 40 MG tablet Commonly known as: LASIX Take 40 mg by mouth daily.   gabapentin 300 MG capsule Commonly known as: NEURONTIN Take 300 mg by mouth daily.   HYDROcodone-acetaminophen 5-325 MG tablet Commonly known as: NORCO/VICODIN Take 1-2 tablets by mouth every 6 (six) hours as needed for moderate pain.   isosorbide mononitrate 30 MG 24 hr tablet Commonly known as: IMDUR Take 2 tablets (60 mg total) by mouth daily.   levothyroxine 112 MCG tablet Commonly known as: SYNTHROID Take 1 tablet (112 mcg total) by mouth every morning.   Linzess 145 MCG Caps capsule Generic drug: linaclotide Take 145 mcg by mouth daily.   losartan 25 MG tablet Commonly known as: COZAAR Take 1 tablet (25 mg total) by mouth daily.   metoprolol succinate 25 MG 24 hr tablet Commonly known as: TOPROL-XL Take 0.5 tablets (12.5 mg total) by mouth daily.   nitroGLYCERIN 0.3 MG SL tablet Commonly known as: NITROSTAT TAKE 1 TABLET UNDER THE TONGUE EVERY 5 MINUTES AS NEEDED FOR CHEST PAIN   rosuvastatin 20 MG tablet Commonly known as: CRESTOR Take 1 tablet (20 mg total) by mouth daily.   triamcinolone cream 0.5 % Commonly known as: KENALOG Apply 1 application topically daily as needed (for sun exposure).   UNABLE TO FIND Med Name: Garlic and Honey (homemade regimen) - two cloves a day with tsp of honey   warfarin 5 MG tablet Commonly known as: COUMADIN Take 1.5 tablets (7.5 mg total) by mouth daily.       Follow-up Information    [facility] Follow up on 05/25/2020.   Specialty: Cardiology Why: Please arrive 15 minutes early for your 10:45am coumadin clinic  appointment. You will have your INR level checked at this visit and receive advice for how to take your coumadin going forward.  Contact information: [address], [address] [place] [phone]       Jettie Booze, MD Follow up on 05/27/2020.   Specialties: Cardiology, Radiology, Interventional Cardiology Why: Please arrive 15 minutes early for your 11:40am post-hospital cardiology appointment  Contact information: [address] [place] [address] [phone]        Benito Mccreedy, MD. Schedule an appointment as soon as possible for a visit in 2 week(s).   Specialty: Internal Medicine Contact information: [address] [place] [address] [phone]              Allergies  Allergen Reactions  . Hydrocodone Nausea Only    Has problems with high doses    Consultations:  Cardiology   Procedures/Studies: DG Ribs Unilateral W/Chest Right  Result Date: 05/16/2020 CLINICAL DATA:  Evaluate possible rib lesion demonstrated on portable chest radiograph from earlier this morning. EXAM: RIGHT RIBS AND CHEST - 3+ VIEW COMPARISON:  Chest radiograph-earlier same day FINDINGS: Unchanged cardiac silhouette and mediastinal contours.  Minimal bilateral infrahilar heterogeneous opacities favored to represent atelectasis, right slightly greater than left. No new focal airspace opacities. No pleural effusion or pneumothorax. No evidence of edema. No displaced right-sided rib fractures. No discrete osseous lesions. Mild degenerative change the right AC joint is incidentally noted. 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 Regional soft tissues appear normal. IMPRESSION: 1. No acute cardiopulmonary disease. 2. No displaced right-sided rib fractures or discrete osseous lesions. Electronically Signed   By: Sandi Mariscal M.D.   On: 05/16/2020 10:49   CT ABDOMEN PELVIS W CONTRAST  Result Date:  05/15/2020 CLINICAL DATA:  Left-sided abdominal pain EXAM: CT ABDOMEN AND PELVIS WITH CONTRAST TECHNIQUE: Multidetector CT imaging of the abdomen and pelvis was performed using the standard protocol following bolus administration of intravenous contrast. CONTRAST:  145mL OMNIPAQUE IOHEXOL 300 MG/ML  SOLN COMPARISON:  04/11/2019 FINDINGS: Lower chest: No acute abnormality. Hepatobiliary: Mild fatty infiltration of the liver is noted. The gallbladder is within normal limits. Pancreas: Unremarkable. No pancreatic ductal dilatation or surrounding inflammatory changes. Spleen: Wedge shaped area of decreased attenuation is noted along the superior aspect of the spleen which may represent prior infarct. Some volume loss in this region is noted which supports the possibility of underlying infarct. Adrenals/Urinary Tract: Adrenal glands are within normal limits. Right kidney demonstrates nonobstructing renal stone measuring 3 mm in the lower pole. Right ureter is within normal limits. The bladder is well distended. Left kidney demonstrates 2 patchy areas of decreased attenuation which persist on delayed images. Given the findings in the spleen these may also represent renal infarcts although focal pyelonephritis deserves consideration given the mild elevation of white blood cell count. Nonobstructing calculi are noted in the left kidney is well. The largest of these measures 10 mm in the lower pole. No obstructive changes are seen. Stomach/Bowel: Colon is predominately decompressed. No obstructive or inflammatory changes are noted. The appendix is within normal limits. No small bowel or gastric abnormality is seen. Vascular/Lymphatic: Aortic atherosclerosis. No enlarged abdominal or pelvic lymph nodes. Reproductive: Status post hysterectomy. No adnexal masses. Other: No abdominal wall hernia or abnormality. No abdominopelvic ascites. Musculoskeletal: No acute or significant osseous findings. IMPRESSION: Multifocal patchy  areas of decreased enhancement within the left kidney with some associated inflammatory changes. These most likely represent areas of focal pyelonephritis although the possibility of renal infarct could not be totally excluded. Geographic area of decreased attenuation with some volume loss in the spleen. This also has the appearance of a focal infarct and is new from the prior exam. Bilateral nonobstructing renal stones larger on the left than the right. Electronically Signed   By: Inez Catalina M.D.   On: 05/15/2020 23:04   DG CHEST PORT 1 VIEW  Addendum Date: 05/16/2020   ADDENDUM REPORT: 05/16/2020 09:00 ADDENDUM: The inferior seventh ribs bilaterally are less sharply defined than other ribs. This finding is probably due to portable technique. It may be prudent to correlate with rib detail views to further evaluate these areas. Electronically Signed   By: Lowella Grip III M.D.   On: 05/16/2020 09:00   Result Date: 05/16/2020 CLINICAL DATA:  Hypoxia EXAM: PORTABLE CHEST 1 VIEW COMPARISON:  August 05, 2018 FINDINGS: There is no edema or airspace opacity. Heart size and pulmonary vascularity are normal. No adenopathy. No bone lesions. IMPRESSION: No edema or airspace opacity.  Cardiac silhouette normal. Electronically Signed: By: Lowella Grip III M.D. On: 05/16/2020 08:33   ECHOCARDIOGRAM COMPLETE  Result Date: 05/16/2020  ECHOCARDIOGRAM REPORT   Patient Name:   KORAH HUFSTEDLER Date of Exam: 05/16/2020 Medical Rec #:  [id number]      Height:       64.0 in Accession #:    [id number]     Weight:       211.4 lb Date of Birth:  07-24-1961     BSA:          2.003 m Patient Age:    89 years       BP:           105/75 mmHg Patient Gender: F              HR:           81 bpm. Exam Location:  Inpatient Procedure: 2D Echo, Cardiac Doppler, Color Doppler and Intracardiac            Opacification Agent Indications:    R94.31 Abnormal EKG; Elevated Troponin.  History:        Patient has prior history of  Echocardiogram examinations, most                 recent 02/09/2015. Previous Myocardial Infarction, COPD and TIA;                 Risk Factors:Hypertension and Dyslipidemia. CKD. Hypothyroidism.  Sonographer:    Tiffany Dance Referring Phys: [id number] Harold Hedge  Sonographer Comments: Technically difficult study due to poor echo windows. IMPRESSIONS  1. LV apical thrombus 1.4 x 1 cm.  2. Very limited image quality due to suboptimal acoustic windows.  3. Left ventricular ejection fraction, by estimation, is 40 to 45%. The left ventricle has mildly decreased function. Left ventricular endocardial border not optimally defined to evaluate regional wall motion. There is mild left ventricular hypertrophy.  Left ventricular diastolic parameters are consistent with Grade I diastolic dysfunction (impaired relaxation).  4. Right ventricular systolic function is normal. The right ventricular size is not well visualized.  5. The mitral valve is grossly normal. No evidence of mitral valve regurgitation.  6. The aortic valve was not well visualized. Aortic valve regurgitation is not visualized. No aortic stenosis is present.  7. The inferior vena cava is normal in size with greater than 50% respiratory variability, suggesting right atrial pressure of 3 mmHg. Conclusion(s)/Recommendation(s): Critical findings reported to Dr. Neysa Bonito and acknowledged at 1530 05/16/20. FINDINGS  Left Ventricle: Left ventricular ejection fraction, by estimation, is 40 to 45%. The left ventricle has mildly decreased function. Left ventricular endocardial border not optimally defined to evaluate regional wall motion. Definity contrast agent was given IV to delineate the left ventricular endocardial borders. The left ventricular internal cavity size was normal in size. There is mild left ventricular hypertrophy. Left ventricular diastolic parameters are consistent with Grade I diastolic dysfunction (impaired relaxation). Right Ventricle: The right  ventricular size is not well visualized. Right vetricular wall thickness was not assessed. Right ventricular systolic function is normal. Left Atrium: Left atrial size was normal in size. Right Atrium: Right atrial size was normal in size. Pericardium: There is no evidence of pericardial effusion. Mitral Valve: The mitral valve is grossly normal. No evidence of mitral valve regurgitation. Tricuspid Valve: The tricuspid valve is not well visualized. Tricuspid valve regurgitation is not demonstrated. Aortic Valve: The aortic valve was not well visualized. Aortic valve regurgitation is not visualized. No aortic stenosis is present. Pulmonic Valve: The pulmonic valve was not well visualized. Aorta: The aortic root and ascending aorta are  structurally normal, with no evidence of dilitation. Venous: The inferior vena cava is normal in size with greater than 50% respiratory variability, suggesting right atrial pressure of 3 mmHg. IAS/Shunts: No atrial level shunt detected by color flow Doppler.  LEFT VENTRICLE PLAX 2D LVIDd:         3.90 cm  Diastology LVIDs:         2.40 cm  LV e' medial:    4.90 cm/s LV PW:         1.40 cm  LV E/e' medial:  7.8 LV IVS:        1.10 cm  LV e' lateral:   6.74 cm/s LVOT diam:     1.70 cm  LV E/e' lateral: 5.7 LV SV:         32 LV SV Index:   16 LVOT Area:     2.27 cm  RIGHT VENTRICLE             IVC RV Basal diam:  2.80 cm     IVC diam: 1.50 cm RV S prime:     10.60 cm/s TAPSE (M-mode): 1.5 cm LEFT ATRIUM           Index       RIGHT ATRIUM           Index LA diam:      3.80 cm 1.90 cm/m  RA Area:     13.20 cm LA Vol (A2C): 53.8 ml 26.86 ml/m RA Volume:   28.80 ml  14.38 ml/m LA Vol (A4C): 17.2 ml 8.59 ml/m  AORTIC VALVE LVOT Vmax:   77.70 cm/s LVOT Vmean:  50.400 cm/s LVOT VTI:    0.142 m  AORTA Ao Root diam: 2.90 cm Ao Asc diam:  3.00 cm MITRAL VALVE MV Area (PHT): 2.13 cm    SHUNTS MV Decel Time: 356 msec    Systemic VTI:  0.14 m MV E velocity: 38.10 cm/s  Systemic Diam: 1.70 cm MV  A velocity: 74.10 cm/s MV E/A ratio:  0.51 Cherlynn Kaiser MD Electronically signed by Cherlynn Kaiser MD Signature Date/Time: 05/16/2020/3:42:15 PM    Final        Subjective: Patient seen and examined at the bedside this morning.  Hemodynamically stable for discharge today  Discharge Exam: Vitals:   05/20/20 2000 05/21/20 0519  BP: 109/86 114/78  Pulse: 74 81  Resp: 17 15  Temp: 98 F (36.7 C) 98.2 F (36.8 C)  SpO2: 96% 97%   Vitals:   05/20/20 0514 05/20/20 1500 05/20/20 2000 05/21/20 0519  BP: 103/67 105/70 109/86 114/78  Pulse: 72 63 74 81  Resp: 17 16 17 15   Temp: 98 F (36.7 C) 97.9 F (36.6 C) 98 F (36.7 C) 98.2 F (36.8 C)  TempSrc: Oral Oral Oral Oral  SpO2: 97% 99% 96% 97%  Weight:      Height:        General: Pt is alert, awake, not in acute distress Cardiovascular: RRR, S1/S2 +, no rubs, no gallops Respiratory: CTA bilaterally, no wheezing, no rhonchi Abdominal: Soft, NT, ND, bowel sounds + Extremities: no edema, no cyanosis    The results of significant diagnostics from this hospitalization (including imaging, microbiology, ancillary and laboratory) are listed below for reference.     Microbiology: Recent Results (from the past 240 hour(s))  Resp Panel by RT PCR (RSV, Flu A&B, Covid) - Nasopharyngeal Swab     Status: None   Collection Time: 05/16/20  1:02 AM  Specimen: Nasopharyngeal Swab  Result Value Ref Range Status   SARS Coronavirus 2 by RT PCR NEGATIVE NEGATIVE Final    Comment: (NOTE) SARS-CoV-2 target nucleic acids are NOT DETECTED.  The SARS-CoV-2 RNA is generally detectable in upper respiratoy specimens during the acute phase of infection. The lowest concentration of SARS-CoV-2 viral copies this assay can detect is 131 copies/mL. A negative result does not preclude SARS-Cov-2 infection and should not be used as the sole basis for treatment or other patient management decisions. A negative result may occur with  improper  specimen collection/handling, submission of specimen other than nasopharyngeal swab, presence of viral mutation(s) within the areas targeted by this assay, and inadequate number of viral copies (<131 copies/mL). A negative result must be combined with clinical observations, patient history, and epidemiological information. The expected result is Negative.  Fact Sheet for Patients:  PinkCheek.be  Fact Sheet for Healthcare Providers:  GravelBags.it  This test is no t yet approved or cleared by the [place] FDA and  has been authorized for detection and/or diagnosis of SARS-CoV-2 by FDA under an Emergency Use Authorization (EUA). This EUA will remain  in effect (meaning this test can be used) for the duration of the COVID-19 declaration under Section 564(b)(1) of the Act, 21 U.S.C. section 360bbb-3(b)(1), unless the authorization is terminated or revoked sooner.     Influenza A by PCR NEGATIVE NEGATIVE Final   Influenza B by PCR NEGATIVE NEGATIVE Final    Comment: (NOTE) The Xpert Xpress SARS-CoV-2/FLU/RSV assay is intended as an aid in  the diagnosis of influenza from Nasopharyngeal swab specimens and  should not be used as a sole basis for treatment. Nasal washings and  aspirates are unacceptable for Xpert Xpress SARS-CoV-2/FLU/RSV  testing.  Fact Sheet for Patients: PinkCheek.be  Fact Sheet for Healthcare Providers: GravelBags.it  This test is not yet approved or cleared by the [place] FDA and  has been authorized for detection and/or diagnosis of SARS-CoV-2 by  FDA under an Emergency Use Authorization (EUA). This EUA will remain  in effect (meaning this test can be used) for the duration of the  Covid-19 declaration under Section 564(b)(1) of the Act, 21  U.S.C. section 360bbb-3(b)(1), unless the authorization is  terminated or revoked.     Respiratory Syncytial Virus by PCR NEGATIVE NEGATIVE Final    Comment: (NOTE) Fact Sheet for Patients: PinkCheek.be  Fact Sheet for Healthcare Providers: GravelBags.it  This test is not yet approved or cleared by the [place] FDA and  has been authorized for detection and/or diagnosis of SARS-CoV-2 by  FDA under an Emergency Use Authorization (EUA). This EUA will remain  in effect (meaning this test can be used) for the duration of the  COVID-19 declaration under Section 564(b)(1) of the Act, 21 U.S.C.  section 360bbb-3(b)(1), unless the authorization is terminated or  revoked. Performed at [facility], [address] [place], [place] [address]      Labs: BNP (last 3 results) No results for input(s): BNP in the last 8760 hours. Basic Metabolic Panel: Recent Labs  Lab 05/15/20 1650 05/16/20 0820 05/17/20 1024 05/18/20 0546 05/19/20 0519  NA 132* 135 135 139 138  K 3.4* 3.6 3.6 3.6 3.6  CL 96* 100 98 101 100  CO2 23 24 24 26 26   GLUCOSE 93 117* 107* 116* 110*  BUN 15 12 12 16 14   CREATININE 1.10* 0.98 1.02* 0.98 0.82  CALCIUM 9.4 9.0 9.1 9.6 9.4  MG  --   --  1.8 1.8 1.8   Liver Function Tests: Recent Labs  Lab 05/15/20 1650 05/16/20 0820 05/17/20 1024  AST 32 23 22  ALT 23 20 18   ALKPHOS 48 43 43  BILITOT 0.5 0.7 0.7  PROT 7.9 7.1 6.6  ALBUMIN 3.8 3.4* 3.1*   Recent Labs  Lab 05/15/20 1650  LIPASE 22   No results for input(s): AMMONIA in the last 168 hours. CBC: Recent Labs  Lab 05/16/20 0820 05/17/20 0708 05/18/20 0546 05/19/20 0519 05/20/20 0523  WBC 8.8 6.6 6.8 7.6 7.1  NEUTROABS 5.7  --   --   --   --   HGB 13.4 13.3 13.2 13.5 13.3  HCT 39.1 38.8 38.6 39.4 38.7  MCV 88.3 88.8 87.3 87.4 88.4  PLT 214 226 222 249 274   Cardiac Enzymes: No results for input(s): CKTOTAL, CKMB, CKMBINDEX, TROPONINI in the last 168 hours. BNP: Invalid input(s): POCBNP CBG: No results  for input(s): GLUCAP in the last 168 hours. D-Dimer No results for input(s): DDIMER in the last 72 hours. Hgb A1c No results for input(s): HGBA1C in the last 72 hours. Lipid Profile No results for input(s): CHOL, HDL, LDLCALC, TRIG, CHOLHDL, LDLDIRECT in the last 72 hours. Thyroid function studies No results for input(s): TSH, T4TOTAL, T3FREE, THYROIDAB in the last 72 hours.  Invalid input(s): FREET3 Anemia work up No results for input(s): VITAMINB12, FOLATE, FERRITIN, TIBC, IRON, RETICCTPCT in the last 72 hours. Urinalysis    Component Value Date/Time   COLORURINE YELLOW 05/15/2020 1654   APPEARANCEUR CLEAR 05/15/2020 1654   LABSPEC 1.010 05/15/2020 1654   PHURINE 5.5 05/15/2020 [place] 05/15/2020 1654   HGBUR NEGATIVE 05/15/2020 1654   BILIRUBINUR NEGATIVE 05/15/2020 1654   KETONESUR NEGATIVE 05/15/2020 1654   PROTEINUR NEGATIVE 05/15/2020 1654   NITRITE NEGATIVE 05/15/2020 1654   LEUKOCYTESUR TRACE (A) 05/15/2020 1654   Sepsis Labs Invalid input(s): PROCALCITONIN,  WBC,  LACTICIDVEN Microbiology Recent Results (from the past 240 hour(s))  Resp Panel by RT PCR (RSV, Flu A&B, Covid) - Nasopharyngeal Swab     Status: None   Collection Time: 05/16/20  1:02 AM   Specimen: Nasopharyngeal Swab  Result Value Ref Range Status   SARS Coronavirus 2 by RT PCR NEGATIVE NEGATIVE Final    Comment: (NOTE) SARS-CoV-2 target nucleic acids are NOT DETECTED.  The SARS-CoV-2 RNA is generally detectable in upper respiratoy specimens during the acute phase of infection. The lowest concentration of SARS-CoV-2 viral copies this assay can detect is 131 copies/mL. A negative result does not preclude SARS-Cov-2 infection and should not be used as the sole basis for treatment or other patient management decisions. A negative result may occur with  improper specimen collection/handling, submission of specimen other than nasopharyngeal swab, presence of viral mutation(s) within  the areas targeted by this assay, and inadequate number of viral copies (<131 copies/mL). A negative result must be combined with clinical observations, patient history, and epidemiological information. The expected result is Negative.  Fact Sheet for Patients:  PinkCheek.be  Fact Sheet for Healthcare Providers:  GravelBags.it  This test is no t yet approved or cleared by the [place] FDA and  has been authorized for detection and/or diagnosis of SARS-CoV-2 by FDA under an Emergency Use Authorization (EUA). This EUA will remain  in effect (meaning this test can be used) for the duration of the COVID-19 declaration under Section 564(b)(1) of the Act, 21 U.S.C. section 360bbb-3(b)(1), unless the authorization is terminated or revoked sooner.  Influenza A by PCR NEGATIVE NEGATIVE Final   Influenza B by PCR NEGATIVE NEGATIVE Final    Comment: (NOTE) The Xpert Xpress SARS-CoV-2/FLU/RSV assay is intended as an aid in  the diagnosis of influenza from Nasopharyngeal swab specimens and  should not be used as a sole basis for treatment. Nasal washings and  aspirates are unacceptable for Xpert Xpress SARS-CoV-2/FLU/RSV  testing.  Fact Sheet for Patients: PinkCheek.be  Fact Sheet for Healthcare Providers: GravelBags.it  This test is not yet approved or cleared by the [place] FDA and  has been authorized for detection and/or diagnosis of SARS-CoV-2 by  FDA under an Emergency Use Authorization (EUA). This EUA will remain  in effect (meaning this test can be used) for the duration of the  Covid-19 declaration under Section 564(b)(1) of the Act, 21  U.S.C. section 360bbb-3(b)(1), unless the authorization is  terminated or revoked.    Respiratory Syncytial Virus by PCR NEGATIVE NEGATIVE Final    Comment: (NOTE) Fact Sheet for  Patients: PinkCheek.be  Fact Sheet for Healthcare Providers: GravelBags.it  This test is not yet approved or cleared by the [place] FDA and  has been authorized for detection and/or diagnosis of SARS-CoV-2 by  FDA under an Emergency Use Authorization (EUA). This EUA will remain  in effect (meaning this test can be used) for the duration of the  COVID-19 declaration under Section 564(b)(1) of the Act, 21 U.S.C.  section 360bbb-3(b)(1), unless the authorization is terminated or  revoked. Performed at [facility], [address] [place], [place] [address]     Please note: You were cared for by a hospitalist during your hospital stay. Once you are discharged, your primary care physician will handle any further medical issues. Please note that NO REFILLS for any discharge medications will be authorized once you are discharged, as it is imperative that you return to your primary care physician (or establish a relationship with a primary care physician if you do not have one) for your post hospital discharge needs so that they can reassess your need for medications and monitor your lab values.    Time coordinating discharge: 40 minutes  SIGNED:   Shelly Coss, MD  [facility] 05/21/2020, 10:15 AM Pager [phone]  If 7PM-7AM, please contact night-coverage [email or web address] Password [email or web address]

## 2020-05-25 ENCOUNTER — Other Ambulatory Visit: Payer: Self-pay

## 2020-05-25 ENCOUNTER — Ambulatory Visit (INDEPENDENT_AMBULATORY_CARE_PROVIDER_SITE_OTHER): Payer: Medicare Other | Admitting: *Deleted

## 2020-05-25 DIAGNOSIS — Z5181 Encounter for therapeutic drug level monitoring: Secondary | ICD-10-CM | POA: Diagnosis not present

## 2020-05-25 DIAGNOSIS — D735 Infarction of spleen: Secondary | ICD-10-CM

## 2020-05-25 DIAGNOSIS — N280 Ischemia and infarction of kidney: Secondary | ICD-10-CM

## 2020-05-25 DIAGNOSIS — I513 Intracardiac thrombosis, not elsewhere classified: Secondary | ICD-10-CM

## 2020-05-25 LAB — POCT INR: INR: 3 (ref 2.0–3.0)

## 2020-05-25 NOTE — Patient Instructions (Signed)
Description   Take 1.5 tablets of warfarin daily except for 2 tablets on Tuesday and Saturday. Recheck INR in 1 week. Be consistent with your 4 cups/week of collards. Call coumadin clinic for any changes in medications or upcoming procedures. [phone].   A full discussion of the nature of anticoagulants has been carried out.  A benefit risk analysis has been presented to the patient, so that they understand the justification for choosing anticoagulation at this time. The need for frequent and regular monitoring, precise dosage adjustment and compliance is stressed.  Side effects of potential bleeding are discussed.  The patient should avoid any OTC items containing aspirin or ibuprofen, and should avoid great swings in general diet.  Avoid alcohol consumption.  Call if any signs of abnormal bleeding.

## 2020-05-26 DIAGNOSIS — E1165 Type 2 diabetes mellitus with hyperglycemia: Secondary | ICD-10-CM | POA: Diagnosis not present

## 2020-05-26 DIAGNOSIS — I5032 Chronic diastolic (congestive) heart failure: Secondary | ICD-10-CM | POA: Diagnosis not present

## 2020-05-26 DIAGNOSIS — E039 Hypothyroidism, unspecified: Secondary | ICD-10-CM | POA: Diagnosis not present

## 2020-05-26 DIAGNOSIS — I10 Essential (primary) hypertension: Secondary | ICD-10-CM | POA: Diagnosis not present

## 2020-05-26 DIAGNOSIS — E782 Mixed hyperlipidemia: Secondary | ICD-10-CM | POA: Diagnosis not present

## 2020-05-26 NOTE — Progress Notes (Deleted)
Cardiology Office Note   Date:  05/26/2020   ID:  Jocelyn [name], DOB 13-Nov-1960, MRN [id number]  PCP:  Benito Mccreedy, MD    No chief complaint on file.  CAD  Wt Readings from Last 3 Encounters:  05/16/20 211 lb 6.7 oz (95.9 kg)  12/27/19 223 lb (101.2 kg)  11/15/19 219 lb 12.8 oz (99.7 kg)       History of Present Illness: Jocelyn [name] is a 59 y.o. female  who has had coronary artery disease. She had a chronically occluded obtuse marginal noted on catheterization from January 2017.   In the past, "She added garlic tabs and honey and this has helped her feel better. She is cutting back on meat. She juices for nutrition. "   Past Medical History:  Diagnosis Date  . Anxiety   . Arthritis   . Chronic diastolic CHF (congestive heart failure), NYHA class 2 ([place])   . Chronic kidney disease    kidney stones hx  . COPD (chronic obstructive pulmonary disease) ([place])   . Depression   . HTN (hypertension)   . Hypercholesterolemia   . Hypothyroidism (acquired)   . Myocardial infarction [facility])    x3- pt unsure if MIs were in 1996 or 1999, had stent fixed in 2016 but no MI at that time per pt  . Thyrotoxicosis   . TIA (transient ischemic attack)    2013    Past Surgical History:  Procedure Laterality Date  . BREAST CYST EXCISION    . CARDIAC CATHETERIZATION N/A 08/24/2015   Procedure: Left Heart Cath and Coronary Angiography;  Surgeon: Jettie Booze, MD;  Location: [facility];  Service: Cardiovascular;  Laterality: N/A;  . COLONOSCOPY    . [place]   and then had procedure to repair in 2016  . KNEE SURGERY    . partial hysterec    . THYROIDECTOMY       Current Outpatient Medications  Medication Sig Dispense Refill  . albuterol (PROVENTIL HFA;VENTOLIN HFA) 108 (90 Base) MCG/ACT inhaler Inhale 2 puffs into the lungs every 4 (four) hours as needed for shortness of breath.     . ALPRAZolam (XANAX) 1 MG tablet Take 1 tablet by  mouth 3 (three) times daily as needed for anxiety.    . cyclobenzaprine (FLEXERIL) 10 MG tablet Take 1 tablet (10 mg total) by mouth 3 (three) times daily as needed for muscle spasms. 21 tablet 0  . docusate sodium (COLACE) 100 MG capsule Take 100 mg by mouth 2 (two) times daily as needed for constipation.    Marland Kitchen estradiol (ESTRACE) 0.5 MG tablet Take 0.5 mg by mouth daily.  0  . furosemide (LASIX) 40 MG tablet Take 40 mg by mouth daily.     Marland Kitchen gabapentin (NEURONTIN) 300 MG capsule Take 300 mg by mouth daily.    Marland Kitchen HYDROcodone-acetaminophen (NORCO/VICODIN) 5-325 MG tablet Take 1-2 tablets by mouth every 6 (six) hours as needed for moderate pain. 15 tablet 0  . isosorbide mononitrate (IMDUR) 30 MG 24 hr tablet Take 2 tablets (60 mg total) by mouth daily. 30 tablet 5  . levothyroxine (SYNTHROID) 112 MCG tablet Take 1 tablet (112 mcg total) by mouth every morning. 90 tablet 3  . LINZESS 145 MCG CAPS capsule Take 145 mcg by mouth daily.    Marland Kitchen losartan (COZAAR) 25 MG tablet Take 1 tablet (25 mg total) by mouth daily. 30 tablet 1  . metoprolol succinate (TOPROL-XL)  25 MG 24 hr tablet Take 0.5 tablets (12.5 mg total) by mouth daily. 30 tablet 1  . nitroGLYCERIN (NITROSTAT) 0.3 MG SL tablet TAKE 1 TABLET UNDER THE TONGUE EVERY 5 MINUTES AS NEEDED FOR CHEST PAIN  0  . rosuvastatin (CRESTOR) 20 MG tablet Take 1 tablet (20 mg total) by mouth daily. 30 tablet 1  . triamcinolone cream (KENALOG) 0.5 % Apply 1 application topically daily as needed (for sun exposure).    Marland Kitchen UNABLE TO FIND Med Name: Garlic and Honey (homemade regimen) - two cloves a day with tsp of honey    . warfarin (COUMADIN) 5 MG tablet Take 1.5 tablets (7.5 mg total) by mouth daily. 30 tablet 2   No current facility-administered medications for this visit.    Allergies:   Hydrocodone    Social History:  The patient  reports that she has been smoking cigarettes. She has been smoking about 0.50 packs per day. She has never used smokeless  tobacco. She reports that she does not drink alcohol and does not use drugs.   Family History:  The patient's ***family history includes Cancer in her father; Colon cancer in her father; Berenice Primas' disease in her father; Heart disease in her father and mother; Hypertension in her maternal grandmother and mother; Stroke in her brother and mother.    ROS:  Please see the history of present illness.   Otherwise, review of systems are positive for ***.   All other systems are reviewed and negative.    PHYSICAL EXAM: VS:  There were no vitals taken for this visit. , BMI There is no height or weight on file to calculate BMI. GEN: Well nourished, well developed, in no acute distress  HEENT: normal  Neck: no JVD, carotid bruits, or masses Cardiac: ***RRR; no murmurs, rubs, or gallops,no edema  Respiratory:  clear to auscultation bilaterally, normal work of breathing GI: soft, nontender, nondistended, + BS MS: no deformity or atrophy  Skin: warm and dry, no rash Neuro:  Strength and sensation are intact Psych: euthymic mood, full affect   EKG:   The ekg ordered today demonstrates ***   Recent Labs: 12/27/2019: TSH 0.51 05/17/2020: ALT 18 05/19/2020: BUN 14; Creatinine, Ser 0.82; Magnesium 1.8; Potassium 3.6; Sodium 138 05/20/2020: Hemoglobin 13.3; Platelets 274   Lipid Panel No results found for: CHOL, TRIG, HDL, CHOLHDL, VLDL, LDLCALC, LDLDIRECT   Other studies Reviewed: Additional studies/ records that were reviewed today with results demonstrating: ***.   ASSESSMENT AND PLAN:  1. CAD: 2. HTN: 3. Hyperlipidemia: 4. Aortic atherosclerosis: 5. She was not interested in the COVID vaccines in 4/21.   Current medicines are reviewed at length with the patient today.  The patient concerns regarding her medicines were addressed.  The following changes have been made:  No change***  Labs/ tests ordered today include: *** No orders of the defined types were placed in this  encounter.   Recommend 150 minutes/week of aerobic exercise Low fat, low carb, high fiber diet recommended  Disposition:   FU in ***   Signed, Larae Grooms, MD  05/26/2020 11:58 AM    [facility] [place], [place], [place]  [address] Phone: [phone]; Fax: [phone]

## 2020-05-27 ENCOUNTER — Ambulatory Visit: Payer: Medicare Other | Admitting: Interventional Cardiology

## 2020-05-27 DIAGNOSIS — I10 Essential (primary) hypertension: Secondary | ICD-10-CM

## 2020-05-27 DIAGNOSIS — I25118 Atherosclerotic heart disease of native coronary artery with other forms of angina pectoris: Secondary | ICD-10-CM

## 2020-05-27 DIAGNOSIS — E7800 Pure hypercholesterolemia, unspecified: Secondary | ICD-10-CM

## 2020-05-27 DIAGNOSIS — I700 Atherosclerosis of aorta: Secondary | ICD-10-CM

## 2020-05-28 NOTE — Progress Notes (Signed)
Cardiology Office Note   Date:  05/29/2020   ID:  Jocelyn [name], DOB January 19, 1961, MRN [id number]  PCP:  Jocelyn Mccreedy, MD    No chief complaint on file.  CAD  Wt Readings from Last 3 Encounters:  05/29/20 214 lb (97.1 kg)  05/16/20 211 lb 6.7 oz (95.9 kg)  12/27/19 223 lb (101.2 kg)       History of Present Illness: Jocelyn [name] is a 59 y.o. female  who has had coronary artery disease. She had a chronically occluded obtuse marginal noted on catheterization from January 2017.   In the past, "She added garlic tabs and honey and this has helped her feel better. She is cutting back on meat. She juices for nutrition. "  Hospitalized in 10/21, records showed: "Presented with abdominal pain. Imagings showed a splenic/renal infarct suspicion for cardiomegaly stroke. Echo showed left ventricular thrombus. Cardiology was consulted and following. Started on heparin drip and warfarin.  Goal INR should be 2-3. She does not have any history of clotting disorder, DVT or PE in the past."  She initially thought her pain was from eating something bad.  THen further w/u was done due to left side pain.  Clot was attributed to tobacco use and estrogen use.  She had success in the past with Chantix.    Denies : Chest pain. Dizziness. Leg edema. Nitroglycerin use. Orthopnea. Palpitations. Paroxysmal nocturnal dyspnea. Shortness of breath. Syncope.   She wants to come off of estradiol.  She wants to take honey/garlic supplement combo, but was told to stop it due to warfarin.  She thinks this combo cured her BP, and keeps plaque out of her vessel.     She is going to try patches to stop smoking.    Past Medical History:  Diagnosis Date  . Anxiety   . Arthritis   . Chronic diastolic CHF (congestive heart failure), NYHA class 2 ([place])   . Chronic kidney disease    kidney stones hx  . COPD (chronic obstructive pulmonary disease) ([place])   . Depression   . HTN (hypertension)   .  Hypercholesterolemia   . Hypothyroidism (acquired)   . Myocardial infarction [facility])    x3- pt unsure if MIs were in 1996 or 1999, had stent fixed in 2016 but no MI at that time per pt  . Thyrotoxicosis   . TIA (transient ischemic attack)    2013    Past Surgical History:  Procedure Laterality Date  . BREAST CYST EXCISION    . CARDIAC CATHETERIZATION N/A 08/24/2015   Procedure: Left Heart Cath and Coronary Angiography;  Surgeon: Jettie Booze, MD;  Location: [facility];  Service: Cardiovascular;  Laterality: N/A;  . COLONOSCOPY    . [place]   and then had procedure to repair in 2016  . KNEE SURGERY    . partial hysterec    . THYROIDECTOMY       Current Outpatient Medications  Medication Sig Dispense Refill  . albuterol (PROVENTIL HFA;VENTOLIN HFA) 108 (90 Base) MCG/ACT inhaler Inhale 2 puffs into the lungs every 4 (four) hours as needed for shortness of breath.     . ALPRAZolam (XANAX) 1 MG tablet Take 1 tablet by mouth 3 (three) times daily as needed for anxiety.    . cyclobenzaprine (FLEXERIL) 10 MG tablet Take 1 tablet (10 mg total) by mouth 3 (three) times daily as needed for muscle spasms. 21 tablet 0  . docusate  sodium (COLACE) 100 MG capsule Take 100 mg by mouth 2 (two) times daily as needed for constipation.    Marland Kitchen estradiol (ESTRACE) 0.5 MG tablet Take 0.5 mg by mouth daily.  0  . furosemide (LASIX) 40 MG tablet Take 40 mg by mouth daily.     Marland Kitchen gabapentin (NEURONTIN) 300 MG capsule Take 300 mg by mouth daily.    Marland Kitchen HYDROcodone-acetaminophen (NORCO/VICODIN) 5-325 MG tablet Take 1-2 tablets by mouth every 6 (six) hours as needed for moderate pain. 15 tablet 0  . isosorbide mononitrate (IMDUR) 30 MG 24 hr tablet Take 2 tablets (60 mg total) by mouth daily. 30 tablet 5  . levothyroxine (SYNTHROID) 112 MCG tablet Take 1 tablet (112 mcg total) by mouth every morning. 90 tablet 3  . LINZESS 145 MCG CAPS capsule Take 145 mcg by mouth daily.    Marland Kitchen  losartan (COZAAR) 25 MG tablet Take 1 tablet (25 mg total) by mouth daily. 30 tablet 1  . metoprolol succinate (TOPROL-XL) 25 MG 24 hr tablet Take 0.5 tablets (12.5 mg total) by mouth daily. 30 tablet 1  . nitroGLYCERIN (NITROSTAT) 0.3 MG SL tablet TAKE 1 TABLET UNDER THE TONGUE EVERY 5 MINUTES AS NEEDED FOR CHEST PAIN  0  . rosuvastatin (CRESTOR) 20 MG tablet Take 1 tablet (20 mg total) by mouth daily. 30 tablet 1  . triamcinolone cream (KENALOG) 0.5 % Apply 1 application topically daily as needed (for sun exposure).    Marland Kitchen UNABLE TO FIND Med Name: Garlic and Honey (homemade regimen) - two cloves a day with tsp of honey    . warfarin (COUMADIN) 5 MG tablet Take 1.5 tablets (7.5 mg total) by mouth daily. 30 tablet 2   No current facility-administered medications for this visit.    Allergies:   Hydrocodone    Social History:  The patient  reports that she has been smoking cigarettes. She has been smoking about 0.50 packs per day. She has never used smokeless tobacco. She reports that she does not drink alcohol and does not use drugs.   Family History:  The patient's family history includes Cancer in her father; Colon cancer in her father; Berenice Primas' disease in her father; Heart disease in her father and mother; Hypertension in her maternal grandmother and mother; Stroke in her brother and mother.    ROS:  Please see the history of present illness.   Otherwise, review of systems are positive for recent hospital stay for abdominal pain.   All other systems are reviewed and negative.    PHYSICAL EXAM: VS:  BP (!) 96/56   Pulse 98   Ht 5\' 4"  (1.626 m)   Wt 214 lb (97.1 kg)   SpO2 94%   BMI 36.73 kg/m  , BMI Body mass index is 36.73 kg/m. GEN: Well nourished, well developed, in no acute distress  HEENT: normal  Neck: no JVD, carotid bruits, or masses Cardiac: RRR; no murmurs, rubs, or gallops,no edema  Respiratory:  clear to auscultation bilaterally, normal work of breathing GI: soft,  nontender, nondistended, + BS MS: no deformity or atrophy  Skin: warm and dry, no rash Neuro:  Strength and sensation are intact Psych: euthymic mood, full affect   Recent Labs: 12/27/2019: TSH 0.51 05/17/2020: ALT 18 05/19/2020: BUN 14; Creatinine, Ser 0.82; Magnesium 1.8; Potassium 3.6; Sodium 138 05/20/2020: Hemoglobin 13.3; Platelets 274   Lipid Panel No results found for: CHOL, TRIG, HDL, CHOLHDL, VLDL, LDLCALC, LDLDIRECT   Other studies Reviewed: Additional studies/ records that  were reviewed today with results demonstrating: hospital records reviewed.   ASSESSMENT AND PLAN:  1. CAD: Known OM occlusion.  No angina, but EF has dropped.  Consider ischemic eval after she no longer requires anticoagulation.   2. LV dysfunction: Apical thrombus.  Coumadin for anticoagulation.  EF 45%, decreased from 2016.  Continue ARB, beta blocker.  BP does not allow further meds.  3. HTN: Low salt, whole food plant based diet recommended.  Limited by Coumadin.  4. Hyperlipidemia: TC 295 in 8/21. COntinue rosuvastatin.  5. Aortic atherosclerosis: COntinue statin.  6. She was not interested in the COVID vaccines in 4/21.  SHe did take Moderna, but atributes her LV clot to the second Moderna shot that she got in 9/21.    Current medicines are reviewed at length with the patient today.  The patient concerns regarding her medicines were addressed.  The following changes have been made:  No change  Labs/ tests ordered today include:  No orders of the defined types were placed in this encounter.   Recommend 150 minutes/week of aerobic exercise Low fat, low carb, high fiber diet recommended  Disposition:   FU in 3 months after echo   Signed, Larae Grooms, MD  05/29/2020 11:48 AM    [facility] [place], [place], [place]  [address] Phone: [phone]; Fax: [phone]

## 2020-05-29 ENCOUNTER — Other Ambulatory Visit: Payer: Self-pay

## 2020-05-29 ENCOUNTER — Encounter: Payer: Self-pay | Admitting: Interventional Cardiology

## 2020-05-29 ENCOUNTER — Ambulatory Visit (INDEPENDENT_AMBULATORY_CARE_PROVIDER_SITE_OTHER): Payer: Medicare Other | Admitting: Interventional Cardiology

## 2020-05-29 VITALS — BP 96/56 | HR 98 | Ht 64.0 in | Wt 214.0 lb

## 2020-05-29 DIAGNOSIS — I513 Intracardiac thrombosis, not elsewhere classified: Secondary | ICD-10-CM | POA: Diagnosis not present

## 2020-05-29 DIAGNOSIS — I25119 Atherosclerotic heart disease of native coronary artery with unspecified angina pectoris: Secondary | ICD-10-CM | POA: Diagnosis not present

## 2020-05-29 DIAGNOSIS — I10 Essential (primary) hypertension: Secondary | ICD-10-CM

## 2020-05-29 DIAGNOSIS — E7800 Pure hypercholesterolemia, unspecified: Secondary | ICD-10-CM

## 2020-05-29 DIAGNOSIS — I700 Atherosclerosis of aorta: Secondary | ICD-10-CM | POA: Diagnosis not present

## 2020-05-29 NOTE — Patient Instructions (Addendum)
Medication Instructions:  Your physician recommends that you continue on your current medications as directed. Please refer to the Current Medication list given to you today.  *If you need a refill on your cardiac medications before your next appointment, please call your pharmacy*   Lab Work: None  If you have labs (blood work) drawn today and your tests are completely normal, you will receive your results only by: Marland Kitchen MyChart Message (if you have MyChart) OR . A paper copy in the mail If you have any lab test that is abnormal or we need to change your treatment, we will call you to review the results.   Testing/Procedures: Your physician has requested that you have an echocardiogram. Echocardiography is a painless test that uses sound waves to create images of your heart. It provides your doctor with information about the size and shape of your heart and how well your heart's chambers and valves are working. This procedure takes approximately one hour. There are no restrictions for this procedure.  Follow-Up: At [facility], you and your health needs are our priority.  As part of our continuing mission to provide you with exceptional heart care, we have created designated Provider Care Teams.  These Care Teams include your primary Cardiologist (physician) and Advanced Practice Providers (APPs -  Physician Assistants and Nurse Practitioners) who all work together to provide you with the care you need, when you need it.  We recommend signing up for the patient portal called "MyChart".  Sign up information is provided on this After Visit Summary.  MyChart is used to connect with patients for Virtual Visits (Telemedicine).  Patients are able to view lab/test results, encounter notes, upcoming appointments, etc.  Non-urgent messages can be sent to your provider as well.   To learn more about what you can do with MyChart, go to [email or web address].    Your next appointment:   3  month(s)  The format for your next appointment:   In Person  Provider:   Casandra Doffing, MD   Other Instructions None

## 2020-05-29 NOTE — Progress Notes (Signed)
Patient made aware.

## 2020-06-01 ENCOUNTER — Other Ambulatory Visit: Payer: Self-pay

## 2020-06-01 ENCOUNTER — Ambulatory Visit (INDEPENDENT_AMBULATORY_CARE_PROVIDER_SITE_OTHER): Payer: Medicare Other | Admitting: *Deleted

## 2020-06-01 DIAGNOSIS — Z5181 Encounter for therapeutic drug level monitoring: Secondary | ICD-10-CM | POA: Diagnosis not present

## 2020-06-01 DIAGNOSIS — D735 Infarction of spleen: Secondary | ICD-10-CM | POA: Diagnosis not present

## 2020-06-01 DIAGNOSIS — I513 Intracardiac thrombosis, not elsewhere classified: Secondary | ICD-10-CM

## 2020-06-01 DIAGNOSIS — N280 Ischemia and infarction of kidney: Secondary | ICD-10-CM

## 2020-06-01 LAB — PROTIME-INR
INR: 6.7 (ref 0.9–1.2)
Prothrombin Time: 66.3 s — ABNORMAL HIGH (ref 9.1–12.0)

## 2020-06-01 LAB — POCT INR: INR: 6.7 — AB (ref 2.0–3.0)

## 2020-06-01 NOTE — Patient Instructions (Signed)
Description   Spoke with pt and instructed pt not to take any Warfarin today, No Warfarin tomorrow , No Warfarin Wednesday, then start taking 1.5 tablets daily except 1 tablet on Sundays and Thursdays. Have a serving of collards today then resume normal cups. Recheck INR in 1 week. Be consistent with your 4 cups/week of collards. Call coumadin clinic for any changes in medications or upcoming procedures. [phone].

## 2020-06-08 ENCOUNTER — Ambulatory Visit (INDEPENDENT_AMBULATORY_CARE_PROVIDER_SITE_OTHER): Payer: Medicare Other

## 2020-06-08 ENCOUNTER — Other Ambulatory Visit: Payer: Self-pay

## 2020-06-08 DIAGNOSIS — N280 Ischemia and infarction of kidney: Secondary | ICD-10-CM | POA: Diagnosis not present

## 2020-06-08 DIAGNOSIS — D735 Infarction of spleen: Secondary | ICD-10-CM

## 2020-06-08 DIAGNOSIS — Z5181 Encounter for therapeutic drug level monitoring: Secondary | ICD-10-CM | POA: Diagnosis not present

## 2020-06-08 DIAGNOSIS — I513 Intracardiac thrombosis, not elsewhere classified: Secondary | ICD-10-CM | POA: Diagnosis not present

## 2020-06-08 LAB — POCT INR: INR: 3 (ref 2.0–3.0)

## 2020-06-08 NOTE — Patient Instructions (Signed)
Description   Continue on same dosage 1.5 tablets daily except 1 tablet on Sundays and Thursdays.  Recheck INR in 1 week. Be consistent with your 4 cups/week of collards. Call coumadin clinic for any changes in medications or upcoming procedures. [phone].

## 2020-06-15 ENCOUNTER — Ambulatory Visit (INDEPENDENT_AMBULATORY_CARE_PROVIDER_SITE_OTHER): Payer: Medicare Other | Admitting: *Deleted

## 2020-06-15 ENCOUNTER — Other Ambulatory Visit: Payer: Self-pay

## 2020-06-15 DIAGNOSIS — Z5181 Encounter for therapeutic drug level monitoring: Secondary | ICD-10-CM

## 2020-06-15 LAB — POCT INR: INR: 3.9 — AB (ref 2.0–3.0)

## 2020-06-15 NOTE — Patient Instructions (Signed)
Description   Hold warfarin today, then start taking 1.5 tablet daily except for 1 tablet on Sunday, Tuesday and Thursday.  Be consistent with your 4 cups/week of collards. Recheck INR in 1 week.  Call coumadin clinic for any changes in medications or upcoming procedures. [phone].

## 2020-06-17 ENCOUNTER — Telehealth: Payer: Self-pay | Admitting: Interventional Cardiology

## 2020-06-17 NOTE — Telephone Encounter (Signed)
Patient discharged from hospital on 10/21 with new warfarin start.  Was admitted due to splenic infarct, renal infarct, and left ventricular apical thrombus.  Due to patient recently starting warfarin and low risk procedure, would not hold warfarin at this time.  If dentist requests a hold, patient will require a Lovenox bridge.

## 2020-06-17 NOTE — Telephone Encounter (Signed)
1. What dental office are you calling from? [facility]   2. What is your office phone number? [phone]  3. What is your fax number? [phone]  4. What type of procedure is the patient having performed? Deep Cleaning that requires gum numbing as well as a few fillings and a extraction   5. What date is procedure scheduled or is the patient there now? TBD  (if the patient is at the dentist's office question goes to their cardiologist if he/she is in the office.  If not, question should go to the DOD).   6. What is your question (ex. Antibiotics prior to procedure, holding medication-we need to know how long dentist wants pt to hold med)? If Warfarin needs to be held prior, and if so how long

## 2020-06-22 ENCOUNTER — Ambulatory Visit (INDEPENDENT_AMBULATORY_CARE_PROVIDER_SITE_OTHER): Payer: Medicare Other | Admitting: *Deleted

## 2020-06-22 ENCOUNTER — Other Ambulatory Visit: Payer: Self-pay

## 2020-06-22 DIAGNOSIS — Z5181 Encounter for therapeutic drug level monitoring: Secondary | ICD-10-CM | POA: Diagnosis not present

## 2020-06-22 DIAGNOSIS — N280 Ischemia and infarction of kidney: Secondary | ICD-10-CM

## 2020-06-22 DIAGNOSIS — I513 Intracardiac thrombosis, not elsewhere classified: Secondary | ICD-10-CM | POA: Diagnosis not present

## 2020-06-22 DIAGNOSIS — D735 Infarction of spleen: Secondary | ICD-10-CM

## 2020-06-22 LAB — POCT INR: INR: 3.1 — AB (ref 2.0–3.0)

## 2020-06-22 NOTE — Patient Instructions (Addendum)
Description   Today take 1 tablet then start taking 1 tablet daily except for 1.5 tablets on Monday, Wednesday, and Friday. Be consistent with your 4 cups/week of collards. Recheck INR in 1 week.  Call coumadin clinic for any changes in medications or upcoming procedures. [phone].

## 2020-06-29 ENCOUNTER — Ambulatory Visit (INDEPENDENT_AMBULATORY_CARE_PROVIDER_SITE_OTHER): Payer: Medicare Other | Admitting: *Deleted

## 2020-06-29 ENCOUNTER — Other Ambulatory Visit: Payer: Self-pay

## 2020-06-29 DIAGNOSIS — Z5181 Encounter for therapeutic drug level monitoring: Secondary | ICD-10-CM

## 2020-06-29 LAB — POCT INR: INR: 2.3 (ref 2.0–3.0)

## 2020-06-29 NOTE — Patient Instructions (Signed)
Description   Continue to take 1 tablet daily except for 1.5 tablets on Wednesday.  Be consistent with your 4 cups/week of collards. Recheck INR in 1 week.  Call coumadin clinic for any changes in medications or upcoming procedures. [phone].

## 2020-07-01 ENCOUNTER — Other Ambulatory Visit: Payer: Self-pay

## 2020-07-01 ENCOUNTER — Ambulatory Visit (INDEPENDENT_AMBULATORY_CARE_PROVIDER_SITE_OTHER): Payer: Medicare Other | Admitting: Endocrinology

## 2020-07-01 ENCOUNTER — Encounter: Payer: Self-pay | Admitting: Endocrinology

## 2020-07-01 VITALS — BP 126/76 | HR 64 | Ht 64.0 in | Wt 214.0 lb

## 2020-07-01 DIAGNOSIS — E890 Postprocedural hypothyroidism: Secondary | ICD-10-CM

## 2020-07-01 DIAGNOSIS — E042 Nontoxic multinodular goiter: Secondary | ICD-10-CM | POA: Diagnosis not present

## 2020-07-01 LAB — TSH: TSH: 0.23 u[IU]/mL — ABNORMAL LOW (ref 0.35–4.50)

## 2020-07-01 LAB — T4, FREE: Free T4: 1.12 ng/dL (ref 0.60–1.60)

## 2020-07-01 MED ORDER — LEVOTHYROXINE SODIUM 100 MCG PO TABS: 100.0000 ug | ORAL_TABLET | Freq: Every day | ORAL | 3 refills | Status: DC

## 2020-07-01 NOTE — Progress Notes (Signed)
Subjective:    Patient ID: Jocelyn [name], female    DOB: 12-08-1960, 59 y.o.   MRN: [id number]  HPI Pt returns for f/u of post-RAI hypothyroidism (she had partial thyroidectomy in 1989; she then intermittently took synthroid intermittently; synthroid was stopped in early 2019, due to suppressed TSH; she had RAI 11/19; she started synthroid 1/20).   pt states she feels well in general.  She take synthroid as rx'ed.    Past Medical History:  Diagnosis Date  . Anxiety   . Arthritis   . Chronic diastolic CHF (congestive heart failure), NYHA class 2 ([place])   . Chronic kidney disease    kidney stones hx  . COPD (chronic obstructive pulmonary disease) ([place])   . Depression   . HTN (hypertension)   . Hypercholesterolemia   . Hypothyroidism (acquired)   . Myocardial infarction [facility])    x3- pt unsure if MIs were in 1996 or 1999, had stent fixed in 2016 but no MI at that time per pt  . Thyrotoxicosis   . TIA (transient ischemic attack)    2013    Past Surgical History:  Procedure Laterality Date  . BREAST CYST EXCISION    . CARDIAC CATHETERIZATION N/A 08/24/2015   Procedure: Left Heart Cath and Coronary Angiography;  Surgeon: Jettie Booze, MD;  Location: [facility];  Service: Cardiovascular;  Laterality: N/A;  . COLONOSCOPY    . [place]   and then had procedure to repair in 2016  . KNEE SURGERY    . partial hysterec    . THYROIDECTOMY      Social History   Socioeconomic History  . Marital status: Legally Separated    Spouse name: Not on file  . Number of children: 2  . Years of education: Not on file  . Highest education level: Not on file  Occupational History  . Occupation: [employer]  Tobacco Use  . Smoking status: Light Tobacco Smoker    Packs/day: 0.50    Types: Cigarettes  . Smokeless tobacco: Never Used  Vaping Use  . Vaping Use: Never used  Substance and Sexual Activity  . Alcohol use: No    Alcohol/week: 0.0 standard drinks  .  Drug use: No  . Sexual activity: Not on file  Other Topics Concern  . Not on file  Social History Narrative  . Not on file   Social Determinants of Health   Financial Resource Strain:   . Difficulty of Paying Living Expenses: Not on file  Food Insecurity:   . Worried About Charity fundraiser in the Last Year: Not on file  . Ran Out of Food in the Last Year: Not on file  Transportation Needs:   . Lack of Transportation (Medical): Not on file  . Lack of Transportation (Non-Medical): Not on file  Physical Activity:   . Days of Exercise per Week: Not on file  . Minutes of Exercise per Session: Not on file  Stress:   . Feeling of Stress : Not on file  Social Connections:   . Frequency of Communication with Friends and Family: Not on file  . Frequency of Social Gatherings with Friends and Family: Not on file  . Attends Religious Services: Not on file  . Active Member of Clubs or Organizations: Not on file  . Attends Archivist Meetings: Not on file  . Marital Status: Not on file  Intimate Partner Violence:   . Fear of Current  or Ex-Partner: Not on file  . Emotionally Abused: Not on file  . Physically Abused: Not on file  . Sexually Abused: Not on file    Current Outpatient Medications on File Prior to Visit  Medication Sig Dispense Refill  . ALPRAZolam (XANAX) 1 MG tablet Take 1 tablet by mouth 3 (three) times daily as needed for anxiety.    . cyclobenzaprine (FLEXERIL) 10 MG tablet Take 1 tablet (10 mg total) by mouth 3 (three) times daily as needed for muscle spasms. 21 tablet 0  . docusate sodium (COLACE) 100 MG capsule Take 100 mg by mouth 2 (two) times daily as needed for constipation.    Marland Kitchen estradiol (ESTRACE) 0.5 MG tablet Take 0.5 mg by mouth daily.  0  . furosemide (LASIX) 40 MG tablet Take 40 mg by mouth daily.     Marland Kitchen gabapentin (NEURONTIN) 300 MG capsule Take 300 mg by mouth daily.    Marland Kitchen HYDROcodone-acetaminophen (NORCO/VICODIN) 5-325 MG tablet Take 1-2  tablets by mouth every 6 (six) hours as needed for moderate pain. 15 tablet 0  . isosorbide mononitrate (IMDUR) 30 MG 24 hr tablet Take 2 tablets (60 mg total) by mouth daily. 30 tablet 5  . LINZESS 145 MCG CAPS capsule Take 145 mcg by mouth daily.    Marland Kitchen losartan (COZAAR) 25 MG tablet Take 1 tablet (25 mg total) by mouth daily. 30 tablet 1  . metoprolol succinate (TOPROL-XL) 25 MG 24 hr tablet Take 0.5 tablets (12.5 mg total) by mouth daily. 30 tablet 1  . nitroGLYCERIN (NITROSTAT) 0.3 MG SL tablet TAKE 1 TABLET UNDER THE TONGUE EVERY 5 MINUTES AS NEEDED FOR CHEST PAIN  0  . rosuvastatin (CRESTOR) 20 MG tablet Take 1 tablet (20 mg total) by mouth daily. 30 tablet 1  . triamcinolone cream (KENALOG) 0.5 % Apply 1 application topically daily as needed (for sun exposure).    Marland Kitchen UNABLE TO FIND Med Name: Garlic and Honey (homemade regimen) - two cloves a day with tsp of honey    . warfarin (COUMADIN) 5 MG tablet Take 1.5 tablets (7.5 mg total) by mouth daily. 30 tablet 2  . albuterol (PROVENTIL HFA;VENTOLIN HFA) 108 (90 Base) MCG/ACT inhaler Inhale 2 puffs into the lungs every 4 (four) hours as needed for shortness of breath.      No current facility-administered medications on file prior to visit.    Allergies  Allergen Reactions  . Hydrocodone Nausea Only    Has problems with high doses    Family History  Problem Relation Age of Onset  . Heart disease Mother   . Stroke Mother   . Hypertension Mother   . Heart disease Father   . Cancer Father   . Graves' disease Father   . Colon cancer Father        pt unsure of age onset, believes he was in his 53s  . Stroke Brother   . Hypertension Maternal Grandmother   . Heart attack Neg Hx   . Esophageal cancer Neg Hx   . Stomach cancer Neg Hx   . Rectal cancer Neg Hx     BP 126/76   Pulse 64   Ht 5\' 4"  (1.626 m)   Wt 214 lb (97.1 kg)   SpO2 98%   BMI 36.73 kg/m    Review of Systems Denies pain or swelling, at the neck.        Objective:   Physical Exam VITAL SIGNS:  See vs page GENERAL: no distress  EYES: bilat proptosis is noted.   NECK: a healed scar is present.  I do not appreciate a nodule in the thyroid or elsewhere in the neck.    Lab Results  Component Value Date   TSH 0.23 (L) 07/01/2020       Assessment & Plan:  Hypothyroidism: overreplaced.  I have sent a prescription to your pharmacy, to reduce the levothyroxine.

## 2020-07-01 NOTE — Patient Instructions (Addendum)
Blood tests are requested for you today.  We'll let you know about the results.  It is best to never miss the medication.  However, if you do miss it, next best is to double up the next time.  Let's recheck the ultrasound.  you will receive a phone call, about a day and time for an appointment.  Please come back for a follow-up appointment in 6 months.

## 2020-07-06 ENCOUNTER — Ambulatory Visit (INDEPENDENT_AMBULATORY_CARE_PROVIDER_SITE_OTHER): Payer: Medicare Other

## 2020-07-06 ENCOUNTER — Other Ambulatory Visit: Payer: Self-pay

## 2020-07-06 DIAGNOSIS — D735 Infarction of spleen: Secondary | ICD-10-CM

## 2020-07-06 DIAGNOSIS — I513 Intracardiac thrombosis, not elsewhere classified: Secondary | ICD-10-CM | POA: Diagnosis not present

## 2020-07-06 DIAGNOSIS — Z5181 Encounter for therapeutic drug level monitoring: Secondary | ICD-10-CM

## 2020-07-06 DIAGNOSIS — N280 Ischemia and infarction of kidney: Secondary | ICD-10-CM

## 2020-07-06 LAB — POCT INR: INR: 2.3 (ref 2.0–3.0)

## 2020-07-06 NOTE — Patient Instructions (Signed)
Description   Continue on same dosage 1 tablet daily except for 1.5 tablets on Wednesdays.  Be consistent with your 4 cups/week of collards. Recheck INR in 2 weeks.  Call coumadin clinic for any changes in medications or upcoming procedures. [phone].

## 2020-07-07 DIAGNOSIS — K5909 Other constipation: Secondary | ICD-10-CM | POA: Diagnosis not present

## 2020-07-07 DIAGNOSIS — E782 Mixed hyperlipidemia: Secondary | ICD-10-CM | POA: Diagnosis not present

## 2020-07-07 DIAGNOSIS — I10 Essential (primary) hypertension: Secondary | ICD-10-CM | POA: Diagnosis not present

## 2020-07-07 DIAGNOSIS — E1165 Type 2 diabetes mellitus with hyperglycemia: Secondary | ICD-10-CM | POA: Diagnosis not present

## 2020-07-07 DIAGNOSIS — I5032 Chronic diastolic (congestive) heart failure: Secondary | ICD-10-CM | POA: Diagnosis not present

## 2020-07-09 DIAGNOSIS — H5213 Myopia, bilateral: Secondary | ICD-10-CM | POA: Diagnosis not present

## 2020-07-20 ENCOUNTER — Ambulatory Visit (INDEPENDENT_AMBULATORY_CARE_PROVIDER_SITE_OTHER): Payer: Medicare Other

## 2020-07-20 ENCOUNTER — Telehealth: Payer: Self-pay

## 2020-07-20 ENCOUNTER — Other Ambulatory Visit: Payer: Self-pay

## 2020-07-20 DIAGNOSIS — I513 Intracardiac thrombosis, not elsewhere classified: Secondary | ICD-10-CM | POA: Diagnosis not present

## 2020-07-20 DIAGNOSIS — Z5181 Encounter for therapeutic drug level monitoring: Secondary | ICD-10-CM | POA: Diagnosis not present

## 2020-07-20 DIAGNOSIS — N280 Ischemia and infarction of kidney: Secondary | ICD-10-CM | POA: Diagnosis not present

## 2020-07-20 DIAGNOSIS — D735 Infarction of spleen: Secondary | ICD-10-CM

## 2020-07-20 LAB — POCT INR: INR: 3 (ref 2.0–3.0)

## 2020-07-20 MED ORDER — ROSUVASTATIN CALCIUM 20 MG PO TABS
20.0000 mg | ORAL_TABLET | Freq: Every day | ORAL | 3 refills | Status: DC
Start: 2020-07-20 — End: 2021-06-10

## 2020-07-20 NOTE — Patient Instructions (Signed)
Description   Continue on same dosage 1 tablet daily except for 1.5 tablets on Wednesdays.  Be consistent with your 4 cups/week of collards. Recheck INR in 3 weeks.  Call coumadin clinic for any changes in medications or upcoming procedures. [phone].

## 2020-07-20 NOTE — Telephone Encounter (Signed)
Spoke with the patient and advised her to restart rosuvastatin 20 mg daily. Patient verbalized understanding and states that she will restart it. Rx has been sent in to patient's requested pharmacy.

## 2020-07-20 NOTE — Telephone Encounter (Signed)
WOuld restart Crestor since she has had a coronary stenosis.  Medicine should help prevent new blockages.

## 2020-07-20 NOTE — Telephone Encounter (Signed)
Called pt to inquire about medication rosuvastatin. Pt stated that she has not taken this medication in 30 days and she feel fine. Pt wanted to know if Dr. Irish Lack still wants her to continue taking this medication, because she ran out of the medication, that's why pt has not taken it for 30 days. Please address

## 2020-07-20 NOTE — Telephone Encounter (Signed)
Pt was seen in Coumadin Clinic today, has empty bottle of Crestor 20mg .  Pt states she was taking previously last filled 05/21/20 #30 tablets, but has not been taking since she ran out.  Bottle has hospitalist MD name on it.  Please call pt and advise if she should be taking, and if so will need rx [facility] to [facility] on [address]

## 2020-07-20 NOTE — Telephone Encounter (Signed)
Left message to call back  

## 2020-07-29 ENCOUNTER — Ambulatory Visit
Admission: RE | Admit: 2020-07-29 | Discharge: 2020-07-29 | Disposition: A | Discharge status: Home / Self Care | Payer: Medicare Other | Source: Ambulatory Visit | Attending: Endocrinology | Admitting: Endocrinology

## 2020-07-29 DIAGNOSIS — E042 Nontoxic multinodular goiter: Secondary | ICD-10-CM

## 2020-07-29 DIAGNOSIS — E041 Nontoxic single thyroid nodule: Secondary | ICD-10-CM | POA: Diagnosis not present

## 2020-08-13 ENCOUNTER — Other Ambulatory Visit: Payer: Self-pay

## 2020-08-13 ENCOUNTER — Ambulatory Visit (INDEPENDENT_AMBULATORY_CARE_PROVIDER_SITE_OTHER): Payer: Medicare Other | Admitting: *Deleted

## 2020-08-13 DIAGNOSIS — D735 Infarction of spleen: Secondary | ICD-10-CM | POA: Diagnosis not present

## 2020-08-13 DIAGNOSIS — N280 Ischemia and infarction of kidney: Secondary | ICD-10-CM

## 2020-08-13 DIAGNOSIS — Z5181 Encounter for therapeutic drug level monitoring: Secondary | ICD-10-CM

## 2020-08-13 DIAGNOSIS — I513 Intracardiac thrombosis, not elsewhere classified: Secondary | ICD-10-CM

## 2020-08-13 LAB — POCT INR: INR: 2.4 (ref 2.0–3.0)

## 2020-08-13 MED ORDER — WARFARIN SODIUM 5 MG PO TABS: ORAL_TABLET | ORAL | 2 refills | Status: DC

## 2020-08-13 NOTE — Patient Instructions (Signed)
Description   Continue taking  1 tablet daily except for 1.5 tablets on Wednesdays.  Be consistent with your 4 cups/week of collards. Recheck INR in 3 weeks.  Call coumadin clinic for any changes in medications or upcoming procedures. [phone].

## 2020-08-25 ENCOUNTER — Other Ambulatory Visit: Payer: Self-pay | Admitting: Interventional Cardiology

## 2020-08-26 NOTE — Telephone Encounter (Addendum)
Warfarin refill request received; his refill was sent on 08/13/20 at her appt. 30 day supply with 2 refills was sent. Will need to call to see if the pharmacy received since we have a confirmation that is was.   Called the pharmacy to confirm that they have the Warfarin refill that was sent on 08/13/20 and they do per [place]. He states he is not sure why it was resent but disregard it and he would take care of it. Will deny this duplicate request.

## 2020-08-27 ENCOUNTER — Other Ambulatory Visit: Payer: Self-pay

## 2020-08-27 ENCOUNTER — Ambulatory Visit (HOSPITAL_COMMUNITY): Payer: Medicare Other | Attending: Internal Medicine

## 2020-08-27 DIAGNOSIS — I513 Intracardiac thrombosis, not elsewhere classified: Secondary | ICD-10-CM | POA: Diagnosis not present

## 2020-08-27 LAB — ECHOCARDIOGRAM COMPLETE
Area-P 1/2: 3.63 cm2
S' Lateral: 3.8 cm

## 2020-08-27 MED ORDER — PERFLUTREN LIPID MICROSPHERE
1.0000 mL | INTRAVENOUS | Status: AC | PRN
  Administered 2020-08-27: 3 mL via INTRAVENOUS

## 2020-08-29 NOTE — Progress Notes (Signed)
Cardiology Office Note   Date:  08/31/2020   ID:  Jocelyn FOCHTMAN, DOB 05-Nov-1960, MRN [id number]  PCP:  Jocelyn Mccreedy, MD    No chief complaint on file.  CAD  Wt Readings from Last 3 Encounters:  08/31/20 216 lb (98 kg)  07/01/20 214 lb (97.1 kg)  05/29/20 214 lb (97.1 kg)       History of Present Illness: Jocelyn [name] is a 60 y.o. female   who has had coronary artery disease. She had a chronically occluded obtuse marginal noted on catheterization from January 2017.  In the past, "She added garlic tabs and honey and this has helped her feel better. She is cutting back on meat. She juices for nutrition."  Hospitalized in 10/21, records showed: "Presented with abdominal pain. Imagings showed a splenic/renal infarct suspicion for cardiomegaly stroke. Echo showed left ventricular thrombus. Cardiology was consulted and following. Started on heparin drip and warfarin. Goal INR should be 2-3. She does not have any history of clotting disorder, DVT or PE in the past."  She initially thought her pain was from eating something bad.  THen further w/u was done due to left side pain.  Clot was attributed to tobacco use and estrogen use.  She had success in the past with Chantix.    In 10/21, records showed: "She wants to come off of estradiol.  She wants to take honey/garlic supplement combo, but was told to stop it due to warfarin.  She thinks this combo cured her BP, and keeps plaque out of her vessel.     She is going to try patches to stop smoking."  1/22 echo showed EF 45% and resolution of LV thrombus.  "LV thrombus has resolved. LVEF slightly better. Is she avoiding tobacco and stopped her estrogen therapy, as these likely played a part in her LV thrombus formation ( although she mentioned she thought it was the COVID vaccine that did it- Moderna)? If so, we can consider stopping Coumadin in the near future. "  She has stopped the supplemental estrogen.  She  has cut back on her cigarettes and is down to half pack per day.  She would like to come off Coumadin as soon as possible.  Most strenuous exercises walking in her townhouse of the stairs.   Past Medical History:  Diagnosis Date  . Anxiety   . Arthritis   . Chronic diastolic CHF (congestive heart failure), NYHA class 2 ([place])   . Chronic kidney disease    kidney stones hx  . COPD (chronic obstructive pulmonary disease) ([place])   . Depression   . HTN (hypertension)   . Hypercholesterolemia   . Hypothyroidism (acquired)   . Myocardial infarction [facility])    x3- pt unsure if MIs were in 1996 or 1999, had stent fixed in 2016 but no MI at that time per pt  . Thyrotoxicosis   . TIA (transient ischemic attack)    2013    Past Surgical History:  Procedure Laterality Date  . BREAST CYST EXCISION    . CARDIAC CATHETERIZATION N/A 08/24/2015   Procedure: Left Heart Cath and Coronary Angiography;  Surgeon: Jettie Booze, MD;  Location: [facility];  Service: Cardiovascular;  Laterality: N/A;  . COLONOSCOPY    . [place]   and then had procedure to repair in 2016  . KNEE SURGERY    . partial hysterec    . THYROIDECTOMY  Current Outpatient Medications  Medication Sig Dispense Refill  . ALPRAZolam (XANAX) 1 MG tablet Take 1 tablet by mouth 3 (three) times daily as needed for anxiety.    . cyclobenzaprine (FLEXERIL) 10 MG tablet Take 1 tablet (10 mg total) by mouth 3 (three) times daily as needed for muscle spasms. 21 tablet 0  . docusate sodium (COLACE) 100 MG capsule Take 100 mg by mouth 2 (two) times daily as needed for constipation.    Marland Kitchen estradiol (ESTRACE) 0.5 MG tablet Take 0.5 mg by mouth daily.  0  . furosemide (LASIX) 40 MG tablet Take 40 mg by mouth daily.    Marland Kitchen gabapentin (NEURONTIN) 300 MG capsule Take 300 mg by mouth daily.    Marland Kitchen HYDROcodone-acetaminophen (NORCO/VICODIN) 5-325 MG tablet Take 1-2 tablets by mouth every 6 (six) hours as needed  for moderate pain. 15 tablet 0  . isosorbide mononitrate (IMDUR) 30 MG 24 hr tablet Take 2 tablets (60 mg total) by mouth daily. 30 tablet 5  . levothyroxine (SYNTHROID) 100 MCG tablet Take 1 tablet (100 mcg total) by mouth daily. 90 tablet 3  . LINZESS 145 MCG CAPS capsule Take 145 mcg by mouth daily.    Marland Kitchen losartan (COZAAR) 25 MG tablet Take 1 tablet (25 mg total) by mouth daily. 30 tablet 1  . metoprolol succinate (TOPROL-XL) 25 MG 24 hr tablet Take 0.5 tablets (12.5 mg total) by mouth daily. 30 tablet 1  . nitroGLYCERIN (NITROSTAT) 0.3 MG SL tablet TAKE 1 TABLET UNDER THE TONGUE EVERY 5 MINUTES AS NEEDED FOR CHEST PAIN  0  . rosuvastatin (CRESTOR) 20 MG tablet Take 1 tablet (20 mg total) by mouth daily. 90 tablet 3  . triamcinolone cream (KENALOG) 0.5 % Apply 1 application topically daily as needed (for sun exposure).    Marland Kitchen UNABLE TO FIND Med Name: Garlic and Honey (homemade regimen) - two cloves a day with tsp of honey    . warfarin (COUMADIN) 5 MG tablet Take 1 tablets daily except 1.5 tablets on Wednesdays or as directed by Coumadin Clinic. 35 tablet 2  . albuterol (PROVENTIL HFA;VENTOLIN HFA) 108 (90 Base) MCG/ACT inhaler Inhale 2 puffs into the lungs every 4 (four) hours as needed for shortness of breath.      No current facility-administered medications for this visit.    Allergies:   Hydrocodone    Social History:  The patient  reports that she has been smoking cigarettes. She has been smoking about 0.50 packs per day. She has never used smokeless tobacco. She reports that she does not drink alcohol and does not use drugs.   Family History:  The patient's family history includes Cancer in her father; Colon cancer in her father; Berenice Primas' disease in her father; Heart disease in her father and mother; Hypertension in her maternal grandmother and mother; Stroke in her brother and mother.    ROS:  Please see the history of present illness.   Otherwise, review of systems are positive for  rare swelling.   All other systems are reviewed and negative.    PHYSICAL EXAM: VS:  BP 100/64   Pulse 93   Ht 5\' 4"  (1.626 m)   Wt 216 lb (98 kg)   SpO2 96%   BMI 37.08 kg/m  , BMI Body mass index is 37.08 kg/m. GEN: Well nourished, well developed, in no acute distress  HEENT: normal  Neck: no JVD, carotid bruits, or masses Cardiac: RRR; no murmurs, rubs, or gallops,no edema  Respiratory:  clear to auscultation bilaterally, normal work of breathing GI: soft, nontender, nondistended, + BS MS: no deformity or atrophy  Skin: warm and dry, no rash Neuro:  Strength and sensation are intact Psych: euthymic mood, full affect     Recent Labs: 05/17/2020: ALT 18 05/19/2020: BUN 14; Creatinine, Ser 0.82; Magnesium 1.8; Potassium 3.6; Sodium 138 05/20/2020: Hemoglobin 13.3; Platelets 274 07/01/2020: TSH 0.23   Lipid Panel No results found for: CHOL, TRIG, HDL, CHOLHDL, VLDL, LDLCALC, LDLDIRECT   Other studies Reviewed: Additional studies/ records that were reviewed today with results demonstrating: labs reviewed.   ASSESSMENT AND PLAN:  1. CAD: No angina on medical therapy.   Continue aggressive secondary prevention.  LDL was high in August 2021.  Total cholesterol was 295 at that time. 2. LV thrombus: treated with Coumadin for 3 months.  Related to estrogen and tobacco abuse.  She thinks it was more from the [place].   Now off of estradiol. INR was 2.6 today.  Restart aspirin 81 mg daily in 2 days.  We discussed staying on Coumadin longer, but given that the thrombus has resolved, she prefers to stop the medicine now.  It has been more than 3 months that she has been adequately anticoagulated.  I think her risk of clotting is less since the estrogen is now off.  She is also decreasing her tobacco consumption which should also reduce the risk of thrombus.  I recommended that she stop smoking. 3. LV dysfunction: Improved.  Continue medical therapy.  Cannot increase meds due to  meds due to borderline BP.  4. HTN: The current medical regimen is effective;  continue present plan and medications. 5. Prediabetes: Healthy, high-fiber diet.  A1C 6.1. 6. Hyperlipidemia: needs lipids rechecked as she has been on rosuvastatin 20 mg daily.  7. Aortic atherosclerosis: Continue statin.   Current medicines are reviewed at length with the patient today.  The patient concerns regarding her medicines were addressed.  The following changes have been made:  No change  Labs/ tests ordered today include:  No orders of the defined types were placed in this encounter.   Recommend 150 minutes/week of aerobic exercise Low fat, low carb, high fiber diet recommended  Disposition:   FU in 6 months   Signed, Larae Grooms, MD  08/31/2020 3:51 PM    [facility] [address], [place], [place]  [address] Phone: [phone]; Fax: [phone]

## 2020-08-31 ENCOUNTER — Encounter: Payer: Self-pay | Admitting: Interventional Cardiology

## 2020-08-31 ENCOUNTER — Other Ambulatory Visit: Payer: Self-pay

## 2020-08-31 ENCOUNTER — Ambulatory Visit (INDEPENDENT_AMBULATORY_CARE_PROVIDER_SITE_OTHER): Payer: Medicare Other

## 2020-08-31 ENCOUNTER — Ambulatory Visit (INDEPENDENT_AMBULATORY_CARE_PROVIDER_SITE_OTHER): Payer: Medicare Other | Admitting: Interventional Cardiology

## 2020-08-31 VITALS — BP 100/64 | HR 93 | Ht 64.0 in | Wt 216.0 lb

## 2020-08-31 DIAGNOSIS — E782 Mixed hyperlipidemia: Secondary | ICD-10-CM | POA: Diagnosis not present

## 2020-08-31 DIAGNOSIS — I25119 Atherosclerotic heart disease of native coronary artery with unspecified angina pectoris: Secondary | ICD-10-CM | POA: Diagnosis not present

## 2020-08-31 DIAGNOSIS — I700 Atherosclerosis of aorta: Secondary | ICD-10-CM

## 2020-08-31 DIAGNOSIS — Z5181 Encounter for therapeutic drug level monitoring: Secondary | ICD-10-CM

## 2020-08-31 DIAGNOSIS — I513 Intracardiac thrombosis, not elsewhere classified: Secondary | ICD-10-CM

## 2020-08-31 DIAGNOSIS — Z720 Tobacco use: Secondary | ICD-10-CM

## 2020-08-31 DIAGNOSIS — I10 Essential (primary) hypertension: Secondary | ICD-10-CM

## 2020-08-31 DIAGNOSIS — D735 Infarction of spleen: Secondary | ICD-10-CM

## 2020-08-31 DIAGNOSIS — N280 Ischemia and infarction of kidney: Secondary | ICD-10-CM

## 2020-08-31 LAB — POCT INR: INR: 2.6 (ref 2.0–3.0)

## 2020-08-31 MED ORDER — ASPIRIN EC 81 MG PO TBEC: 81.0000 mg | DELAYED_RELEASE_TABLET | Freq: Every day | ORAL | 3 refills | Status: DC

## 2020-08-31 NOTE — Patient Instructions (Signed)
Medication Instructions:  Your physician has recommended you make the following change in your medication:   1. STOP: coumadin  2, On Wednesday 09/02/20, START: Aspirin 81 mg once a day   *If you need a refill on your cardiac medications before your next appointment, please call your pharmacy*   Lab Work: Your physician recommends that you return for a FASTING lipid profile and liver function panel   If you have labs (blood work) drawn today and your tests are completely normal, you will receive your results only by: Marland Kitchen MyChart Message (if you have MyChart) OR . A paper copy in the mail If you have any lab test that is abnormal or we need to change your treatment, we will call you to review the results.   Testing/Procedures: None   Follow-Up: At [facility], you and your health needs are our priority.  As part of our continuing mission to provide you with exceptional heart care, we have created designated Provider Care Teams.  These Care Teams include your primary Cardiologist (physician) and Advanced Practice Providers (APPs -  Physician Assistants and Nurse Practitioners) who all work together to provide you with the care you need, when you need it.  We recommend signing up for the patient portal called "MyChart".  Sign up information is provided on this After Visit Summary.  MyChart is used to connect with patients for Virtual Visits (Telemedicine).  Patients are able to view lab/test results, encounter notes, upcoming appointments, etc.  Non-urgent messages can be sent to your provider as well.   To learn more about what you can do with MyChart, go to [email or web address].    Your next appointment:   6 month(s)  The format for your next appointment:   In Person  Provider:   You may see Larae Grooms, MD or one of the following Advanced Practice Providers on your designated Care Team:    Melina Copa, PA-C  Ermalinda Barrios, PA-C    Other Instructions None

## 2020-08-31 NOTE — Patient Instructions (Signed)
Description   Since you missed yesterday's dosage take an extra 1/2 tablet today, then resume same dosage 1 tablet daily except for 1.5 tablets on Wednesdays.  Be consistent with your 4 cups/week of collards. Recheck INR in 4 weeks.  Call coumadin clinic for any changes in medications or upcoming procedures. [phone].

## 2020-09-07 ENCOUNTER — Other Ambulatory Visit: Payer: Medicare Other | Admitting: *Deleted

## 2020-09-07 ENCOUNTER — Other Ambulatory Visit: Payer: Self-pay

## 2020-09-07 DIAGNOSIS — I513 Intracardiac thrombosis, not elsewhere classified: Secondary | ICD-10-CM | POA: Diagnosis not present

## 2020-09-07 DIAGNOSIS — I700 Atherosclerosis of aorta: Secondary | ICD-10-CM

## 2020-09-07 DIAGNOSIS — I10 Essential (primary) hypertension: Secondary | ICD-10-CM

## 2020-09-07 DIAGNOSIS — E782 Mixed hyperlipidemia: Secondary | ICD-10-CM

## 2020-09-07 DIAGNOSIS — I25119 Atherosclerotic heart disease of native coronary artery with unspecified angina pectoris: Secondary | ICD-10-CM

## 2020-09-08 LAB — HEPATIC FUNCTION PANEL
ALT: 22 IU/L (ref 0–32)
AST: 25 IU/L (ref 0–40)
Albumin: 4.3 g/dL (ref 3.8–4.9)
Alkaline Phosphatase: 84 IU/L (ref 44–121)
Bilirubin Total: 0.3 mg/dL (ref 0.0–1.2)
Bilirubin, Direct: 0.11 mg/dL (ref 0.00–0.40)
Total Protein: 7.2 g/dL (ref 6.0–8.5)

## 2020-09-08 LAB — LIPID PANEL
Chol/HDL Ratio: 3.5 ratio (ref 0.0–4.4)
Cholesterol, Total: 218 mg/dL — ABNORMAL HIGH (ref 100–199)
HDL: 63 mg/dL (ref >39)
LDL Chol Calc (NIH): 132 mg/dL — ABNORMAL HIGH (ref 0–99)
Triglycerides: 133 mg/dL (ref 0–149)
VLDL Cholesterol Cal: 23 mg/dL (ref 5–40)

## 2020-09-10 ENCOUNTER — Telehealth: Payer: Self-pay | Admitting: *Deleted

## 2020-09-10 DIAGNOSIS — E782 Mixed hyperlipidemia: Secondary | ICD-10-CM

## 2020-09-10 NOTE — Telephone Encounter (Signed)
-----   Message from Jettie Booze, MD sent at 09/08/2020  5:42 PM EST ----- LDL high on rosuvastatin 20 mg daily.  WOuld increase Crestor to 40 mg daily with lipids and liver in 3 months. If this does not work, may need to refer to lipid clinic.

## 2020-09-10 NOTE — Telephone Encounter (Signed)
I spoke with patient and reviewed lab results/recommendations with her.  She reports she has not been taking Rosuvastatin since December.  She will resume Rosuvastatin 20 mg daily and come to the office on March 31,2022 for fasting lab work

## 2020-09-21 DIAGNOSIS — H5203 Hypermetropia, bilateral: Secondary | ICD-10-CM | POA: Diagnosis not present

## 2020-09-24 DIAGNOSIS — J3089 Other allergic rhinitis: Secondary | ICD-10-CM | POA: Diagnosis not present

## 2020-09-24 DIAGNOSIS — E1165 Type 2 diabetes mellitus with hyperglycemia: Secondary | ICD-10-CM | POA: Diagnosis not present

## 2020-09-24 DIAGNOSIS — Z1389 Encounter for screening for other disorder: Secondary | ICD-10-CM | POA: Diagnosis not present

## 2020-09-24 DIAGNOSIS — I2510 Atherosclerotic heart disease of native coronary artery without angina pectoris: Secondary | ICD-10-CM | POA: Diagnosis not present

## 2020-09-24 DIAGNOSIS — E782 Mixed hyperlipidemia: Secondary | ICD-10-CM | POA: Diagnosis not present

## 2020-09-24 DIAGNOSIS — G5602 Carpal tunnel syndrome, left upper limb: Secondary | ICD-10-CM | POA: Diagnosis not present

## 2020-09-24 DIAGNOSIS — Z720 Tobacco use: Secondary | ICD-10-CM | POA: Diagnosis not present

## 2020-09-24 DIAGNOSIS — I5032 Chronic diastolic (congestive) heart failure: Secondary | ICD-10-CM | POA: Diagnosis not present

## 2020-09-24 DIAGNOSIS — E039 Hypothyroidism, unspecified: Secondary | ICD-10-CM | POA: Diagnosis not present

## 2020-09-24 DIAGNOSIS — Z0001 Encounter for general adult medical examination with abnormal findings: Secondary | ICD-10-CM | POA: Diagnosis not present

## 2020-09-24 DIAGNOSIS — I10 Essential (primary) hypertension: Secondary | ICD-10-CM | POA: Diagnosis not present

## 2020-10-01 DIAGNOSIS — G5602 Carpal tunnel syndrome, left upper limb: Secondary | ICD-10-CM | POA: Diagnosis not present

## 2020-10-01 DIAGNOSIS — Z720 Tobacco use: Secondary | ICD-10-CM | POA: Diagnosis not present

## 2020-10-01 DIAGNOSIS — E782 Mixed hyperlipidemia: Secondary | ICD-10-CM | POA: Diagnosis not present

## 2020-10-01 DIAGNOSIS — I5032 Chronic diastolic (congestive) heart failure: Secondary | ICD-10-CM | POA: Diagnosis not present

## 2020-10-01 DIAGNOSIS — E039 Hypothyroidism, unspecified: Secondary | ICD-10-CM | POA: Diagnosis not present

## 2020-10-01 DIAGNOSIS — I10 Essential (primary) hypertension: Secondary | ICD-10-CM | POA: Diagnosis not present

## 2020-10-01 DIAGNOSIS — I2510 Atherosclerotic heart disease of native coronary artery without angina pectoris: Secondary | ICD-10-CM | POA: Diagnosis not present

## 2020-10-01 DIAGNOSIS — J3089 Other allergic rhinitis: Secondary | ICD-10-CM | POA: Diagnosis not present

## 2020-10-01 DIAGNOSIS — E1165 Type 2 diabetes mellitus with hyperglycemia: Secondary | ICD-10-CM | POA: Diagnosis not present

## 2020-10-29 ENCOUNTER — Other Ambulatory Visit: Payer: Self-pay

## 2020-10-29 ENCOUNTER — Other Ambulatory Visit: Payer: Medicare Other | Admitting: *Deleted

## 2020-10-29 DIAGNOSIS — E782 Mixed hyperlipidemia: Secondary | ICD-10-CM | POA: Diagnosis not present

## 2020-10-29 LAB — HEPATIC FUNCTION PANEL
ALT: 18 IU/L (ref 0–32)
AST: 19 IU/L (ref 0–40)
Albumin: 4.1 g/dL (ref 3.8–4.9)
Alkaline Phosphatase: 73 IU/L (ref 44–121)
Bilirubin Total: 0.3 mg/dL (ref 0.0–1.2)
Bilirubin, Direct: 0.11 mg/dL (ref 0.00–0.40)
Total Protein: 7 g/dL (ref 6.0–8.5)

## 2020-10-29 LAB — LIPID PANEL
Chol/HDL Ratio: 5.2 ratio — ABNORMAL HIGH (ref 0.0–4.4)
Cholesterol, Total: 286 mg/dL — ABNORMAL HIGH (ref 100–199)
HDL: 55 mg/dL (ref >39)
LDL Chol Calc (NIH): 207 mg/dL — ABNORMAL HIGH (ref 0–99)
Triglycerides: 134 mg/dL (ref 0–149)
VLDL Cholesterol Cal: 24 mg/dL (ref 5–40)

## 2020-10-30 ENCOUNTER — Telehealth: Payer: Self-pay | Admitting: *Deleted

## 2020-10-30 DIAGNOSIS — E782 Mixed hyperlipidemia: Secondary | ICD-10-CM

## 2020-10-30 NOTE — Telephone Encounter (Addendum)
-----   Message from Jettie Booze, MD sent at 10/30/2020  2:38 PM EDT ----- LDL significantly increased from prior.  May need referral to  PharmD lipid clinic to discuss options.    Patient missing doses of medication. Advised about the referral to PharmD and that they could repeat her lab work after she started taking the medication correctly. Verbalized agreement and understanding

## 2020-11-02 DIAGNOSIS — I10 Essential (primary) hypertension: Secondary | ICD-10-CM | POA: Diagnosis not present

## 2020-11-02 DIAGNOSIS — Z720 Tobacco use: Secondary | ICD-10-CM | POA: Diagnosis not present

## 2020-11-02 DIAGNOSIS — E782 Mixed hyperlipidemia: Secondary | ICD-10-CM | POA: Diagnosis not present

## 2020-11-02 DIAGNOSIS — E1165 Type 2 diabetes mellitus with hyperglycemia: Secondary | ICD-10-CM | POA: Diagnosis not present

## 2020-11-02 DIAGNOSIS — E039 Hypothyroidism, unspecified: Secondary | ICD-10-CM | POA: Diagnosis not present

## 2020-11-02 DIAGNOSIS — I5032 Chronic diastolic (congestive) heart failure: Secondary | ICD-10-CM | POA: Diagnosis not present

## 2020-11-02 DIAGNOSIS — G5602 Carpal tunnel syndrome, left upper limb: Secondary | ICD-10-CM | POA: Diagnosis not present

## 2020-11-02 DIAGNOSIS — J3089 Other allergic rhinitis: Secondary | ICD-10-CM | POA: Diagnosis not present

## 2020-11-02 DIAGNOSIS — I2510 Atherosclerotic heart disease of native coronary artery without angina pectoris: Secondary | ICD-10-CM | POA: Diagnosis not present

## 2020-11-24 NOTE — Telephone Encounter (Signed)
Per last note on referral patient did not want to schedule lipid clinic appointment at this time.

## 2020-11-27 ENCOUNTER — Other Ambulatory Visit: Payer: Self-pay | Admitting: Internal Medicine

## 2020-11-30 ENCOUNTER — Other Ambulatory Visit: Payer: Self-pay | Admitting: Internal Medicine

## 2020-11-30 DIAGNOSIS — Z1231 Encounter for screening mammogram for malignant neoplasm of breast: Secondary | ICD-10-CM

## 2020-12-06 ENCOUNTER — Other Ambulatory Visit: Payer: Self-pay | Admitting: Interventional Cardiology

## 2020-12-06 ENCOUNTER — Other Ambulatory Visit: Payer: Self-pay | Admitting: Endocrinology

## 2020-12-06 DIAGNOSIS — E890 Postprocedural hypothyroidism: Secondary | ICD-10-CM

## 2020-12-07 NOTE — Telephone Encounter (Signed)
Per Dr. Irish Lack:   1. STOP: coumadin   2, On Wednesday 09/02/20, START: Aspirin 81 mg once a day

## 2021-03-01 DIAGNOSIS — E782 Mixed hyperlipidemia: Secondary | ICD-10-CM | POA: Diagnosis not present

## 2021-03-01 DIAGNOSIS — I10 Essential (primary) hypertension: Secondary | ICD-10-CM | POA: Diagnosis not present

## 2021-03-01 DIAGNOSIS — J3089 Other allergic rhinitis: Secondary | ICD-10-CM | POA: Diagnosis not present

## 2021-03-01 DIAGNOSIS — I872 Venous insufficiency (chronic) (peripheral): Secondary | ICD-10-CM | POA: Diagnosis not present

## 2021-03-01 DIAGNOSIS — G5602 Carpal tunnel syndrome, left upper limb: Secondary | ICD-10-CM | POA: Diagnosis not present

## 2021-03-01 DIAGNOSIS — E1165 Type 2 diabetes mellitus with hyperglycemia: Secondary | ICD-10-CM | POA: Diagnosis not present

## 2021-03-01 DIAGNOSIS — Z720 Tobacco use: Secondary | ICD-10-CM | POA: Diagnosis not present

## 2021-03-01 DIAGNOSIS — I5032 Chronic diastolic (congestive) heart failure: Secondary | ICD-10-CM | POA: Diagnosis not present

## 2021-03-01 DIAGNOSIS — M79671 Pain in right foot: Secondary | ICD-10-CM | POA: Diagnosis not present

## 2021-03-01 DIAGNOSIS — E039 Hypothyroidism, unspecified: Secondary | ICD-10-CM | POA: Diagnosis not present

## 2021-03-01 DIAGNOSIS — I2510 Atherosclerotic heart disease of native coronary artery without angina pectoris: Secondary | ICD-10-CM | POA: Diagnosis not present

## 2021-03-24 DIAGNOSIS — I10 Essential (primary) hypertension: Secondary | ICD-10-CM | POA: Diagnosis not present

## 2021-03-24 DIAGNOSIS — E1165 Type 2 diabetes mellitus with hyperglycemia: Secondary | ICD-10-CM | POA: Diagnosis not present

## 2021-03-24 DIAGNOSIS — E039 Hypothyroidism, unspecified: Secondary | ICD-10-CM | POA: Diagnosis not present

## 2021-03-24 DIAGNOSIS — I2510 Atherosclerotic heart disease of native coronary artery without angina pectoris: Secondary | ICD-10-CM | POA: Diagnosis not present

## 2021-03-24 DIAGNOSIS — I872 Venous insufficiency (chronic) (peripheral): Secondary | ICD-10-CM | POA: Diagnosis not present

## 2021-03-24 DIAGNOSIS — Z720 Tobacco use: Secondary | ICD-10-CM | POA: Diagnosis not present

## 2021-03-24 DIAGNOSIS — I5032 Chronic diastolic (congestive) heart failure: Secondary | ICD-10-CM | POA: Diagnosis not present

## 2021-03-24 DIAGNOSIS — J3089 Other allergic rhinitis: Secondary | ICD-10-CM | POA: Diagnosis not present

## 2021-03-24 DIAGNOSIS — G5602 Carpal tunnel syndrome, left upper limb: Secondary | ICD-10-CM | POA: Diagnosis not present

## 2021-03-24 DIAGNOSIS — E782 Mixed hyperlipidemia: Secondary | ICD-10-CM | POA: Diagnosis not present

## 2021-04-28 DIAGNOSIS — E782 Mixed hyperlipidemia: Secondary | ICD-10-CM | POA: Diagnosis not present

## 2021-04-28 DIAGNOSIS — E1165 Type 2 diabetes mellitus with hyperglycemia: Secondary | ICD-10-CM | POA: Diagnosis not present

## 2021-04-28 DIAGNOSIS — I872 Venous insufficiency (chronic) (peripheral): Secondary | ICD-10-CM | POA: Diagnosis not present

## 2021-04-28 DIAGNOSIS — J3089 Other allergic rhinitis: Secondary | ICD-10-CM | POA: Diagnosis not present

## 2021-04-28 DIAGNOSIS — I5032 Chronic diastolic (congestive) heart failure: Secondary | ICD-10-CM | POA: Diagnosis not present

## 2021-04-28 DIAGNOSIS — Z720 Tobacco use: Secondary | ICD-10-CM | POA: Diagnosis not present

## 2021-04-28 DIAGNOSIS — I10 Essential (primary) hypertension: Secondary | ICD-10-CM | POA: Diagnosis not present

## 2021-04-28 DIAGNOSIS — G5602 Carpal tunnel syndrome, left upper limb: Secondary | ICD-10-CM | POA: Diagnosis not present

## 2021-04-28 DIAGNOSIS — E039 Hypothyroidism, unspecified: Secondary | ICD-10-CM | POA: Diagnosis not present

## 2021-04-28 DIAGNOSIS — I2510 Atherosclerotic heart disease of native coronary artery without angina pectoris: Secondary | ICD-10-CM | POA: Diagnosis not present

## 2021-05-11 DIAGNOSIS — J0100 Acute maxillary sinusitis, unspecified: Secondary | ICD-10-CM | POA: Diagnosis not present

## 2021-05-15 ENCOUNTER — Other Ambulatory Visit: Payer: Self-pay

## 2021-05-15 ENCOUNTER — Encounter (HOSPITAL_BASED_OUTPATIENT_CLINIC_OR_DEPARTMENT_OTHER): Payer: Self-pay | Admitting: Emergency Medicine

## 2021-05-15 ENCOUNTER — Inpatient Hospital Stay (HOSPITAL_BASED_OUTPATIENT_CLINIC_OR_DEPARTMENT_OTHER)
Admission: EM | Admit: 2021-05-15 | Discharge: 2021-05-21 | DRG: 190 | Disposition: A | Discharge status: Home / Self Care | Payer: Medicare Other | Attending: Internal Medicine | Admitting: Internal Medicine

## 2021-05-15 ENCOUNTER — Emergency Department (HOSPITAL_BASED_OUTPATIENT_CLINIC_OR_DEPARTMENT_OTHER): Payer: Medicare Other

## 2021-05-15 DIAGNOSIS — J9621 Acute and chronic respiratory failure with hypoxia: Secondary | ICD-10-CM | POA: Diagnosis not present

## 2021-05-15 DIAGNOSIS — I513 Intracardiac thrombosis, not elsewhere classified: Secondary | ICD-10-CM | POA: Diagnosis not present

## 2021-05-15 DIAGNOSIS — F17210 Nicotine dependence, cigarettes, uncomplicated: Secondary | ICD-10-CM | POA: Diagnosis not present

## 2021-05-15 DIAGNOSIS — I252 Old myocardial infarction: Secondary | ICD-10-CM | POA: Diagnosis not present

## 2021-05-15 DIAGNOSIS — I130 Hypertensive heart and chronic kidney disease with heart failure and stage 1 through stage 4 chronic kidney disease, or unspecified chronic kidney disease: Secondary | ICD-10-CM | POA: Diagnosis present

## 2021-05-15 DIAGNOSIS — N189 Chronic kidney disease, unspecified: Secondary | ICD-10-CM | POA: Diagnosis not present

## 2021-05-15 DIAGNOSIS — J441 Chronic obstructive pulmonary disease with (acute) exacerbation: Secondary | ICD-10-CM | POA: Diagnosis not present

## 2021-05-15 DIAGNOSIS — R0603 Acute respiratory distress: Secondary | ICD-10-CM

## 2021-05-15 DIAGNOSIS — N179 Acute kidney failure, unspecified: Secondary | ICD-10-CM | POA: Diagnosis not present

## 2021-05-15 DIAGNOSIS — R0609 Other forms of dyspnea: Secondary | ICD-10-CM | POA: Diagnosis not present

## 2021-05-15 DIAGNOSIS — F32A Depression, unspecified: Secondary | ICD-10-CM | POA: Diagnosis present

## 2021-05-15 DIAGNOSIS — E876 Hypokalemia: Secondary | ICD-10-CM | POA: Diagnosis not present

## 2021-05-15 DIAGNOSIS — R000 Tachycardia, unspecified: Secondary | ICD-10-CM | POA: Diagnosis not present

## 2021-05-15 DIAGNOSIS — I10 Essential (primary) hypertension: Secondary | ICD-10-CM | POA: Diagnosis not present

## 2021-05-15 DIAGNOSIS — Z833 Family history of diabetes mellitus: Secondary | ICD-10-CM | POA: Diagnosis not present

## 2021-05-15 DIAGNOSIS — G4733 Obstructive sleep apnea (adult) (pediatric): Secondary | ICD-10-CM | POA: Diagnosis present

## 2021-05-15 DIAGNOSIS — I25118 Atherosclerotic heart disease of native coronary artery with other forms of angina pectoris: Secondary | ICD-10-CM | POA: Diagnosis not present

## 2021-05-15 DIAGNOSIS — J189 Pneumonia, unspecified organism: Secondary | ICD-10-CM | POA: Diagnosis present

## 2021-05-15 DIAGNOSIS — E7800 Pure hypercholesterolemia, unspecified: Secondary | ICD-10-CM | POA: Diagnosis not present

## 2021-05-15 DIAGNOSIS — E039 Hypothyroidism, unspecified: Secondary | ICD-10-CM | POA: Diagnosis not present

## 2021-05-15 DIAGNOSIS — Z720 Tobacco use: Secondary | ICD-10-CM | POA: Diagnosis present

## 2021-05-15 DIAGNOSIS — Z885 Allergy status to narcotic agent status: Secondary | ICD-10-CM

## 2021-05-15 DIAGNOSIS — Z8673 Personal history of transient ischemic attack (TIA), and cerebral infarction without residual deficits: Secondary | ICD-10-CM | POA: Diagnosis not present

## 2021-05-15 DIAGNOSIS — J440 Chronic obstructive pulmonary disease with acute lower respiratory infection: Principal | ICD-10-CM | POA: Diagnosis present

## 2021-05-15 DIAGNOSIS — Z8249 Family history of ischemic heart disease and other diseases of the circulatory system: Secondary | ICD-10-CM | POA: Diagnosis not present

## 2021-05-15 DIAGNOSIS — R059 Cough, unspecified: Secondary | ICD-10-CM | POA: Diagnosis not present

## 2021-05-15 DIAGNOSIS — Z823 Family history of stroke: Secondary | ICD-10-CM

## 2021-05-15 DIAGNOSIS — F419 Anxiety disorder, unspecified: Secondary | ICD-10-CM | POA: Diagnosis present

## 2021-05-15 DIAGNOSIS — B974 Respiratory syncytial virus as the cause of diseases classified elsewhere: Secondary | ICD-10-CM | POA: Diagnosis present

## 2021-05-15 DIAGNOSIS — Z79890 Hormone replacement therapy (postmenopausal): Secondary | ICD-10-CM

## 2021-05-15 DIAGNOSIS — I5032 Chronic diastolic (congestive) heart failure: Secondary | ICD-10-CM | POA: Diagnosis not present

## 2021-05-15 DIAGNOSIS — Z20822 Contact with and (suspected) exposure to covid-19: Secondary | ICD-10-CM | POA: Diagnosis present

## 2021-05-15 DIAGNOSIS — R0602 Shortness of breath: Secondary | ICD-10-CM | POA: Diagnosis not present

## 2021-05-15 DIAGNOSIS — Z955 Presence of coronary angioplasty implant and graft: Secondary | ICD-10-CM | POA: Diagnosis not present

## 2021-05-15 DIAGNOSIS — I2510 Atherosclerotic heart disease of native coronary artery without angina pectoris: Secondary | ICD-10-CM | POA: Diagnosis present

## 2021-05-15 DIAGNOSIS — Z79899 Other long term (current) drug therapy: Secondary | ICD-10-CM

## 2021-05-15 DIAGNOSIS — Z7982 Long term (current) use of aspirin: Secondary | ICD-10-CM

## 2021-05-15 LAB — CBC WITH DIFFERENTIAL/PLATELET
Abs Immature Granulocytes: 0.02 10*3/uL (ref 0.00–0.07)
Basophils Absolute: 0 10*3/uL (ref 0.0–0.1)
Basophils Relative: 1 %
Eosinophils Absolute: 0.1 10*3/uL (ref 0.0–0.5)
Eosinophils Relative: 1 %
HCT: 39.6 % (ref 36.0–46.0)
Hemoglobin: 13.5 g/dL (ref 12.0–15.0)
Immature Granulocytes: 0 %
Lymphocytes Relative: 27 %
Lymphs Abs: 1.7 10*3/uL (ref 0.7–4.0)
MCH: 29.9 pg (ref 26.0–34.0)
MCHC: 34.1 g/dL (ref 30.0–36.0)
MCV: 87.8 fL (ref 80.0–100.0)
Monocytes Absolute: 0.6 10*3/uL (ref 0.1–1.0)
Monocytes Relative: 9 %
Neutro Abs: 3.8 10*3/uL (ref 1.7–7.7)
Neutrophils Relative %: 62 %
Platelets: 287 10*3/uL (ref 150–400)
RBC: 4.51 MIL/uL (ref 3.87–5.11)
RDW: 13.7 % (ref 11.5–15.5)
WBC: 6.2 10*3/uL (ref 4.0–10.5)
nRBC: 0 % (ref 0.0–0.2)

## 2021-05-15 LAB — BASIC METABOLIC PANEL
Anion gap: 8 (ref 5–15)
BUN: 8 mg/dL (ref 6–20)
CO2: 28 mmol/L (ref 22–32)
Calcium: 9.4 mg/dL (ref 8.9–10.3)
Chloride: 99 mmol/L (ref 98–111)
Creatinine, Ser: 1.04 mg/dL — ABNORMAL HIGH (ref 0.44–1.00)
GFR, Estimated: >60 mL/min (ref >60)
Glucose, Bld: 98 mg/dL (ref 70–99)
Potassium: 3.6 mmol/L (ref 3.5–5.1)
Sodium: 135 mmol/L (ref 135–145)

## 2021-05-15 LAB — RESP PANEL BY RT-PCR (FLU A&B, COVID) ARPGX2
Influenza A by PCR: NEGATIVE
Influenza B by PCR: NEGATIVE
SARS Coronavirus 2 by RT PCR: NEGATIVE

## 2021-05-15 LAB — TROPONIN I (HIGH SENSITIVITY): Troponin I (High Sensitivity): 7 ng/L (ref <18)

## 2021-05-15 LAB — BRAIN NATRIURETIC PEPTIDE: B Natriuretic Peptide: 37.6 pg/mL (ref 0.0–100.0)

## 2021-05-15 MED ORDER — IPRATROPIUM-ALBUTEROL 0.5-2.5 (3) MG/3ML IN SOLN
3.0000 mL | Freq: Once | RESPIRATORY_TRACT | Status: AC
  Administered 2021-05-15: 3 mL via RESPIRATORY_TRACT
  Filled 2021-05-15: qty 3

## 2021-05-15 MED ORDER — IPRATROPIUM-ALBUTEROL 0.5-2.5 (3) MG/3ML IN SOLN
3.0000 mL | Freq: Once | RESPIRATORY_TRACT | Status: AC
Start: 2021-05-15 — End: 2021-05-15
  Administered 2021-05-15: 3 mL via RESPIRATORY_TRACT
  Filled 2021-05-15: qty 3

## 2021-05-15 MED ORDER — METHYLPREDNISOLONE SODIUM SUCC 125 MG IJ SOLR
125.0000 mg | Freq: Once | INTRAMUSCULAR | Status: AC
Start: 2021-05-15 — End: 2021-05-15
  Administered 2021-05-15: 125 mg via INTRAVENOUS
  Filled 2021-05-15: qty 2

## 2021-05-15 NOTE — ED Triage Notes (Signed)
Reports SOB since yesterday.  Also having a cough with productive white sputum at times.  Denies any fever.  Uses inhaler at home.  Reports no relief with that.

## 2021-05-15 NOTE — ED Provider Notes (Signed)
[facility] DEPART[facility] Provider Note   CSN: [id number] Arrival date & time: 05/15/21  1917     History Chief Complaint  Patient presents with   Shortness of Breath    Jocelyn [name] is a 60 y.o. female who presents with concern for 2 days of shortness of breath with cough with productive clear sputum.  No fevers at home but does endorse chills.  Has albuterol inhaler which has not been helping.  Denies any sore throat, nausea, vomiting, fevers, or diarrhea.  I personally reviewed this patient's medical records.  She has history of CAD, heart failure on 40 mg of Lasix daily, TIA, COPD, and CKD.  HPI     Past Medical History:  Diagnosis Date   Anxiety    Arthritis    Chronic diastolic CHF (congestive heart failure), NYHA class 2 (HCC)    Chronic kidney disease    kidney stones hx   COPD (chronic obstructive pulmonary disease) (HCC)    Depression    HTN (hypertension)    Hypercholesterolemia    Hypothyroidism (acquired)    Myocardial infarction ([place])    x3- pt unsure if MIs were in 1996 or 1999, had stent fixed in 2016 but no MI at that time per pt   Thyrotoxicosis    TIA (transient ischemic attack)    2013    Patient Active Problem List   Diagnosis Date Noted   COPD exacerbation ([place]) 05/16/2021   LV (left ventricular) mural thrombus 05/25/2020   Renal infarct ([place]) 05/16/2020   Hypoxia 05/16/2020   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   Proptosis 12/27/2019   Multinodular goiter 03/27/2019   Hypothyroidism 09/26/2018   Sacroiliitis, not elsewhere classified ([place]) 04/24/2017   Chronic right-sided low back pain with right-sided sciatica 01/12/2017   Chronic bilateral thoracic back pain 01/12/2017   Other spondylosis with radiculopathy, lumbar region 10/19/2016   Depression with anxiety 07/16/2016   Chronic diastolic CHF (congestive heart failure) ([place]) 07/16/2016   Aortic atherosclerosis ([place]) 11/24/2015   Unstable angina ([place]) 08/21/2015   Chest  pain    Coronary artery disease    CAD (coronary artery disease), native coronary artery 01/14/2015   Hypertension 01/14/2015   Hypercholesterolemia 01/14/2015   Tobacco abuse 01/14/2015    Past Surgical History:  Procedure Laterality Date   BREAST CYST EXCISION     CARDIAC CATHETERIZATION N/A 08/24/2015   Procedure: Left Heart Cath and Coronary Angiography;  Surgeon: Jettie Booze, MD;  Location: [facility];  Service: Cardiovascular;  Laterality: N/A;   COLONOSCOPY     CORONARY STENT PLACEMENT  1999   and then had procedure to repair in 2016   KNEE SURGERY     partial hysterec     THYROIDECTOMY       OB History   No obstetric history on file.     Family History  Problem Relation Age of Onset   Heart disease Mother    Stroke Mother    Hypertension Mother    Heart disease Father    Cancer Father    Berenice Primas' disease Father    Colon cancer Father        pt unsure of age onset, believes he was in his 36s   Stroke Brother    Hypertension Maternal Grandmother    Heart attack Neg Hx    Esophageal cancer Neg Hx    Stomach cancer Neg Hx    Rectal cancer Neg Hx     Social History  Tobacco Use   Smoking status: Light Smoker    Packs/day: 0.50    Types: Cigarettes   Smokeless tobacco: Never  Vaping Use   Vaping Use: Never used  Substance Use Topics   Alcohol use: No    Alcohol/week: 0.0 standard drinks   Drug use: No    Home Medications Prior to Admission medications   Medication Sig Start Date End Date Taking? Authorizing Provider  albuterol (PROVENTIL HFA;VENTOLIN HFA) 108 (90 Base) MCG/ACT inhaler Inhale 2 puffs into the lungs every 4 (four) hours as needed for shortness of breath.  06/21/15 05/29/20  [provider]  ALPRAZolam [name]) 1 MG tablet Take 1 tablet by mouth 3 (three) times daily as needed for anxiety. 05/10/20   [provider]  aspirin EC 81 MG tablet Take 1 tablet (81 mg total) by mouth daily. Swallow whole. 09/02/20    Jettie Booze, MD  cyclobenzaprine (FLEXERIL) 10 MG tablet Take 1 tablet (10 mg total) by mouth 3 (three) times daily as needed for muscle spasms. 03/29/17   [name], NP  docusate sodium (COLACE) 100 MG capsule Take 100 mg by mouth 2 (two) times daily as needed for constipation. 12/19/19   [provider]  estradiol (ESTRACE) 0.5 MG tablet Take 0.5 mg by mouth daily. 08/15/17   [provider]  furosemide (LASIX) 40 MG tablet Take 40 mg by mouth daily.    [provider]  gabapentin (NEURONTIN) 300 MG capsule Take 300 mg by mouth daily. 03/11/20   [provider]  HYDROcodone-acetaminophen (NORCO/VICODIN) 5-325 MG tablet Take 1-2 tablets by mouth every 6 (six) hours as needed for moderate pain. 05/21/20   Shelly Coss, MD  isosorbide mononitrate (IMDUR) 30 MG 24 hr tablet Take 2 tablets (60 mg total) by mouth daily. 07/17/16   [name], MD  levothyroxine (SYNTHROID) 100 MCG tablet Take 1 tablet (100 mcg total) by mouth daily. 07/01/20   [name], MD  levothyroxine (SYNTHROID) 112 MCG tablet TAKE 1 TABLET BY MOUTH EVERY MORNING 12/06/20   [name], MD  LINZESS 145 MCG CAPS capsule Take 145 mcg by mouth daily. 04/30/20   [provider]  losartan (COZAAR) 25 MG tablet Take 1 tablet (25 mg total) by mouth daily. 05/21/20   Shelly Coss, MD  metoprolol succinate (TOPROL-XL) 25 MG 24 hr tablet Take 0.5 tablets (12.5 mg total) by mouth daily. 05/21/20   Shelly Coss, MD  nitroGLYCERIN (NITROSTAT) 0.3 MG SL tablet TAKE 1 TABLET UNDER THE TONGUE EVERY 5 MINUTES AS NEEDED FOR CHEST PAIN 10/28/15   [provider]  rosuvastatin (CRESTOR) 20 MG tablet Take 1 tablet (20 mg total) by mouth daily. 07/20/20   Jettie Booze, MD  triamcinolone cream (KENALOG) 0.5 % Apply 1 application topically daily as needed (for sun exposure).    [provider]  UNABLE TO FIND Med Name: Garlic and Honey (homemade regimen) - two  cloves a day with tsp of honey    [provider]    Allergies    Hydrocodone  Review of Systems   Review of Systems  Constitutional:  Positive for activity change and chills. Negative for appetite change, diaphoresis, fatigue and fever.  HENT: Negative.    Eyes: Negative.   Respiratory:  Positive for cough and shortness of breath.   Cardiovascular: Negative.   Gastrointestinal: Negative.   Genitourinary: Negative.   Musculoskeletal: Negative.   Neurological: Negative.    Physical Exam Updated Vital Signs BP 102/66  Pulse (!) 102   Temp 98.9 F (37.2 C) (Oral)   Resp (!) 26   Ht 5\' 4"  (1.626 m)   Wt 88.5 kg   SpO2 92%   BMI 33.47 kg/m   Physical Exam Vitals and nursing note reviewed.  Constitutional:      General: She is not in acute distress.    Appearance: She is not ill-appearing or toxic-appearing.     Interventions: She is not intubated. HENT:     Head: Normocephalic and atraumatic.     Nose: Nose normal.     Mouth/Throat:     Mouth: Mucous membranes are moist.     Pharynx: Oropharynx is clear. Uvula midline. No oropharyngeal exudate or posterior oropharyngeal erythema.     Tonsils: No tonsillar exudate.  Eyes:     General: Lids are normal. Vision grossly intact.        Right eye: No discharge.        Left eye: No discharge.     Extraocular Movements: Extraocular movements intact.     Conjunctiva/sclera: Conjunctivae normal.     Pupils: Pupils are equal, round, and reactive to light.  Neck:     Trachea: Trachea and phonation normal.  Cardiovascular:     Rate and Rhythm: Normal rate and regular rhythm.     Pulses: Normal pulses.     Heart sounds: Normal heart sounds. No murmur heard. Pulmonary:     Effort: Tachypnea, accessory muscle usage and prolonged expiration present. No respiratory distress or retractions. She is not intubated.     Breath sounds: Examination of the right-upper field reveals wheezing. Examination of the left-upper field  reveals wheezing. Examination of the right-middle field reveals wheezing. Examination of the left-middle field reveals wheezing. Examination of the left-lower field reveals wheezing. Wheezing present. No rales.  Chest:     Chest wall: No mass, lacerations, deformity, swelling, tenderness, crepitus or edema.  Abdominal:     General: Bowel sounds are normal. There is no distension.     Palpations: Abdomen is soft.     Tenderness: There is no abdominal tenderness. There is no right CVA tenderness, left CVA tenderness, guarding or rebound.  Musculoskeletal:        General: No deformity.     Cervical back: Normal range of motion and neck supple. No edema, rigidity or crepitus. No pain with movement, spinous process tenderness or muscular tenderness.     Right lower leg: 1+ Edema present.     Left lower leg: 1+ Edema present.  Lymphadenopathy:     Cervical: No cervical adenopathy.  Skin:    General: Skin is warm and dry.     Capillary Refill: Capillary refill takes less than 2 seconds.  Neurological:     General: No focal deficit present.     Mental Status: She is alert. Mental status is at baseline.  Psychiatric:        Mood and Affect: Mood normal.    ED Results / Procedures / Treatments   Labs (all labs ordered are listed, but only abnormal results are displayed) Labs Reviewed  BASIC METABOLIC PANEL - Abnormal; Notable for the following components:      Result Value   Creatinine, Ser 1.04 (*)    All other components within normal limits  RESP PANEL BY RT-PCR (FLU A&B, COVID) ARPGX2  CBC WITH DIFFERENTIAL/PLATELET  BRAIN NATRIURETIC PEPTIDE  TROPONIN I (HIGH SENSITIVITY)  TROPONIN I (HIGH SENSITIVITY)    EKG EKG Interpretation  Date/Time:  Saturday May 15 2021 19:33:35 EDT Ventricular Rate:  108 PR Interval:  140 QRS Duration: 93 QT Interval:  355 QTC Calculation: 476 R Axis:   53 Text Interpretation: Sinus tachycardia Probable left atrial enlargement Abnormal R-wave  progression, early transition T wave abnormality RESOLVED SINCE PREVIOUS Confirmed by Blanchie Dessert [phone]) on 05/15/2021 10:02:40 PM  Radiology DG Chest Port 1 View  Result Date: 05/15/2021 CLINICAL DATA:  Cough and shortness of breath. EXAM: PORTABLE CHEST 1 VIEW COMPARISON:  May 16, 2020 FINDINGS: The heart size and mediastinal contours are within normal limits. No focal consolidation. No visible pleural effusion or pneumothorax. The visualized skeletal structures are unremarkable. IMPRESSION: No acute cardiopulmonary disease. Electronically Signed   By: Dahlia Bailiff M.D.   On: 05/15/2021 21:13    Procedures Procedures   Medications Ordered in ED Medications  magnesium sulfate IVPB 2 g 50 mL (2 g Intravenous New Bag/Given 05/16/21 0045)  albuterol (PROVENTIL,VENTOLIN) solution continuous neb (has no administration in time range)  ipratropium-albuterol (DUONEB) 0.5-2.5 (3) MG/3ML nebulizer solution 3 mL (3 mLs Nebulization Given 05/15/21 2051)  methylPREDNISolone sodium succinate (SOLU-MEDROL) 125 mg/2 mL injection 125 mg (125 mg Intravenous Given 05/15/21 2119)  ipratropium-albuterol (DUONEB) 0.5-2.5 (3) MG/3ML nebulizer solution 3 mL (3 mLs Nebulization Given 05/15/21 2249)  ipratropium (ATROVENT) nebulizer solution 0.5 mg (0.5 mg Nebulization Given 05/16/21 0046)    ED Course  I have reviewed the triage vital signs and the nursing notes.  Pertinent labs & imaging results that were available during my care of the patient were reviewed by me and considered in my medical decision making (see chart for details).  Clinical Course as of 05/16/21 0051  Sun May 16, 2021  0007 Unfortunately, when patient was reevaluated at the bedside and informed of her normal troponin and BNP, patient was hypoxic to the low 80s.  Required 5 L before her oxygen saturation become greater than 90%.  At this time do feel patient would require admission to the hospital for decompensated COPD  exacerbation with new hypoxia.  Patient is not on any oxygen at home at baseline. [RS]  [phone] Consult to hospitalist, Dr. Marlowe Sax, who is agreeable to admitting this patient to her service.  I appreciate her collaboration in the care of this patient. [RS]    Clinical Course User Index [RS] Dallen Bunte, Sharlene Dory   MDM Rules/Calculators/A&P                         60 year old female with CHF and COPD who presents with concern for 2 days of shortness of breath.  Differential diagnosis is broad and includes but is limited to CHF, COPD exacerbations, ACS, PE, pleural effusion, pneumothorax, pneumonia, sepsis.  Tachycardic to 117 on intake, vital signs otherwise normal.  Patient maintaining her saturations on room air.  Cardiopulmonary exam revealed wheezing throughout the lung fields bilaterally with decreased breath sounds in the lung bases.  Abdominal exam is benign, HEENT exam is benign.  1+ bilateral lower extremity edema without pitting.  We will proceed with DuoNeb and IV steroids as well as work-up for shortness of breath.  Patient reevaluated after initial DuoNeb with some improvement in her symptoms but remains with wheezing throughout.  Second DuoNeb ordered.  CBC unremarkable, BMP unremarkable, BNP is normal, troponin is negative, and respiratory pathogen panel is negative.  Chest x-ray negative for acute cardiopulmonary disease and EKG is nonischemic.  Patient's work-up was overall very reassuring, however upon reevaluation she  was hypoxic to the low 60s when sleeping on room air.  Placed on 5 L to increase her oxygen saturation to greater than 90%.  Weaned down to 3 L at this time.  Do feel patient would benefit from mission to the hospital at this time given new hypoxia in the setting of acute COPD exacerbation.  We will proceed with CAT and magnesium as well which has been ordered and communicated to respiratory.  Consult to hospitalist as above. Alfredo voiced understanding of  her medical evaluation and treat plan thus far.  Each of her questions was answered to her expressed satisfaction.  Return precautions were given.  Patient is amenable to plan for admission at this time.   This chart was dictated using voice recognition software, Dragon. Despite the best efforts of this provider to proofread and correct errors, errors may still occur which can change documentation meaning.  Final Clinical Impression(s) / ED Diagnoses Final diagnoses:  None    Rx / DC Orders ED Discharge Orders     None        Emeline Darling, PA-C 05/16/21 0051    Blanchie Dessert, MD 05/16/21 1625

## 2021-05-16 ENCOUNTER — Inpatient Hospital Stay (HOSPITAL_COMMUNITY): Payer: Medicare Other

## 2021-05-16 ENCOUNTER — Encounter (HOSPITAL_COMMUNITY): Payer: Self-pay | Admitting: Internal Medicine

## 2021-05-16 DIAGNOSIS — Z833 Family history of diabetes mellitus: Secondary | ICD-10-CM | POA: Diagnosis not present

## 2021-05-16 DIAGNOSIS — I513 Intracardiac thrombosis, not elsewhere classified: Secondary | ICD-10-CM

## 2021-05-16 DIAGNOSIS — R0609 Other forms of dyspnea: Secondary | ICD-10-CM | POA: Diagnosis not present

## 2021-05-16 DIAGNOSIS — J9621 Acute and chronic respiratory failure with hypoxia: Secondary | ICD-10-CM | POA: Diagnosis present

## 2021-05-16 DIAGNOSIS — F419 Anxiety disorder, unspecified: Secondary | ICD-10-CM | POA: Diagnosis present

## 2021-05-16 DIAGNOSIS — J189 Pneumonia, unspecified organism: Secondary | ICD-10-CM | POA: Diagnosis present

## 2021-05-16 DIAGNOSIS — R0602 Shortness of breath: Secondary | ICD-10-CM | POA: Diagnosis not present

## 2021-05-16 DIAGNOSIS — F32A Depression, unspecified: Secondary | ICD-10-CM | POA: Diagnosis present

## 2021-05-16 DIAGNOSIS — Z720 Tobacco use: Secondary | ICD-10-CM | POA: Diagnosis not present

## 2021-05-16 DIAGNOSIS — I5032 Chronic diastolic (congestive) heart failure: Secondary | ICD-10-CM | POA: Diagnosis present

## 2021-05-16 DIAGNOSIS — E039 Hypothyroidism, unspecified: Secondary | ICD-10-CM | POA: Diagnosis present

## 2021-05-16 DIAGNOSIS — I252 Old myocardial infarction: Secondary | ICD-10-CM | POA: Diagnosis not present

## 2021-05-16 DIAGNOSIS — I10 Essential (primary) hypertension: Secondary | ICD-10-CM

## 2021-05-16 DIAGNOSIS — E7800 Pure hypercholesterolemia, unspecified: Secondary | ICD-10-CM | POA: Diagnosis present

## 2021-05-16 DIAGNOSIS — B974 Respiratory syncytial virus as the cause of diseases classified elsewhere: Secondary | ICD-10-CM | POA: Diagnosis present

## 2021-05-16 DIAGNOSIS — Z8673 Personal history of transient ischemic attack (TIA), and cerebral infarction without residual deficits: Secondary | ICD-10-CM | POA: Diagnosis not present

## 2021-05-16 DIAGNOSIS — J440 Chronic obstructive pulmonary disease with acute lower respiratory infection: Secondary | ICD-10-CM | POA: Diagnosis present

## 2021-05-16 DIAGNOSIS — I130 Hypertensive heart and chronic kidney disease with heart failure and stage 1 through stage 4 chronic kidney disease, or unspecified chronic kidney disease: Secondary | ICD-10-CM | POA: Diagnosis present

## 2021-05-16 DIAGNOSIS — E876 Hypokalemia: Secondary | ICD-10-CM

## 2021-05-16 DIAGNOSIS — R0603 Acute respiratory distress: Secondary | ICD-10-CM | POA: Diagnosis not present

## 2021-05-16 DIAGNOSIS — Z823 Family history of stroke: Secondary | ICD-10-CM | POA: Diagnosis not present

## 2021-05-16 DIAGNOSIS — J441 Chronic obstructive pulmonary disease with (acute) exacerbation: Secondary | ICD-10-CM | POA: Diagnosis present

## 2021-05-16 DIAGNOSIS — N179 Acute kidney failure, unspecified: Secondary | ICD-10-CM | POA: Diagnosis present

## 2021-05-16 DIAGNOSIS — I25118 Atherosclerotic heart disease of native coronary artery with other forms of angina pectoris: Secondary | ICD-10-CM | POA: Diagnosis not present

## 2021-05-16 DIAGNOSIS — Z20822 Contact with and (suspected) exposure to covid-19: Secondary | ICD-10-CM | POA: Diagnosis present

## 2021-05-16 DIAGNOSIS — F17210 Nicotine dependence, cigarettes, uncomplicated: Secondary | ICD-10-CM | POA: Diagnosis present

## 2021-05-16 DIAGNOSIS — N189 Chronic kidney disease, unspecified: Secondary | ICD-10-CM | POA: Diagnosis present

## 2021-05-16 DIAGNOSIS — Z955 Presence of coronary angioplasty implant and graft: Secondary | ICD-10-CM | POA: Diagnosis not present

## 2021-05-16 DIAGNOSIS — G4733 Obstructive sleep apnea (adult) (pediatric): Secondary | ICD-10-CM | POA: Diagnosis present

## 2021-05-16 DIAGNOSIS — I2510 Atherosclerotic heart disease of native coronary artery without angina pectoris: Secondary | ICD-10-CM | POA: Diagnosis present

## 2021-05-16 DIAGNOSIS — Z8249 Family history of ischemic heart disease and other diseases of the circulatory system: Secondary | ICD-10-CM | POA: Diagnosis not present

## 2021-05-16 DIAGNOSIS — R7989 Other specified abnormal findings of blood chemistry: Secondary | ICD-10-CM | POA: Diagnosis not present

## 2021-05-16 LAB — TROPONIN I (HIGH SENSITIVITY): Troponin I (High Sensitivity): 10 ng/L (ref <18)

## 2021-05-16 LAB — BASIC METABOLIC PANEL
Anion gap: 16 — ABNORMAL HIGH (ref 5–15)
BUN: 9 mg/dL (ref 6–20)
CO2: 19 mmol/L — ABNORMAL LOW (ref 22–32)
Calcium: 10.1 mg/dL (ref 8.9–10.3)
Chloride: 101 mmol/L (ref 98–111)
Creatinine, Ser: 1.25 mg/dL — ABNORMAL HIGH (ref 0.44–1.00)
GFR, Estimated: 50 mL/min — ABNORMAL LOW (ref >60)
Glucose, Bld: 219 mg/dL — ABNORMAL HIGH (ref 70–99)
Potassium: 3.8 mmol/L (ref 3.5–5.1)
Sodium: 136 mmol/L (ref 135–145)

## 2021-05-16 LAB — POTASSIUM: Potassium: 2.5 mmol/L — CL (ref 3.5–5.1)

## 2021-05-16 LAB — MAGNESIUM: Magnesium: 2.3 mg/dL (ref 1.7–2.4)

## 2021-05-16 LAB — TSH: TSH: 0.174 u[IU]/mL — ABNORMAL LOW (ref 0.350–4.500)

## 2021-05-16 LAB — D-DIMER, QUANTITATIVE: D-Dimer, Quant: 0.65 ug/mL-FEU — ABNORMAL HIGH (ref 0.00–0.50)

## 2021-05-16 MED ORDER — ALBUTEROL SULFATE (2.5 MG/3ML) 0.083% IN NEBU: 2.5000 mg | INHALATION_SOLUTION | RESPIRATORY_TRACT | Status: DC | PRN

## 2021-05-16 MED ORDER — SODIUM CHLORIDE 0.9% FLUSH
3.0000 mL | Freq: Two times a day (BID) | INTRAVENOUS | Status: DC
  Administered 2021-05-16 – 2021-05-21 (×9): 3 mL via INTRAVENOUS

## 2021-05-16 MED ORDER — LEVALBUTEROL HCL 0.63 MG/3ML IN NEBU
0.6300 mg | INHALATION_SOLUTION | Freq: Three times a day (TID) | RESPIRATORY_TRACT | Status: DC
  Administered 2021-05-17: 0.63 mg via RESPIRATORY_TRACT
  Filled 2021-05-16: qty 3

## 2021-05-16 MED ORDER — IOHEXOL 350 MG/ML SOLN
80.0000 mL | Freq: Once | INTRAVENOUS | Status: AC | PRN
  Administered 2021-05-16: 80 mL via INTRAVENOUS

## 2021-05-16 MED ORDER — ALBUTEROL SULFATE (2.5 MG/3ML) 0.083% IN NEBU: 10.0000 mg/h | INHALATION_SOLUTION | RESPIRATORY_TRACT | Status: DC

## 2021-05-16 MED ORDER — IPRATROPIUM-ALBUTEROL 0.5-2.5 (3) MG/3ML IN SOLN: 3.0000 mL | Freq: Once | RESPIRATORY_TRACT | Status: DC

## 2021-05-16 MED ORDER — ACETAMINOPHEN 325 MG PO TABS
650.0000 mg | ORAL_TABLET | Freq: Four times a day (QID) | ORAL | Status: DC | PRN
  Administered 2021-05-16 – 2021-05-18 (×2): 650 mg via ORAL
  Filled 2021-05-16 (×2): qty 2

## 2021-05-16 MED ORDER — LEVOTHYROXINE SODIUM 25 MCG PO TABS: 125.0000 ug | ORAL_TABLET | Freq: Every day | ORAL | Status: DC

## 2021-05-16 MED ORDER — FUROSEMIDE 40 MG PO TABS: 40.0000 mg | ORAL_TABLET | Freq: Every day | ORAL | Status: DC

## 2021-05-16 MED ORDER — ALBUTEROL (5 MG/ML) CONTINUOUS INHALATION SOLN
10.0000 mg/h | INHALATION_SOLUTION | RESPIRATORY_TRACT | Status: DC
  Administered 2021-05-16: 10 mg/h via RESPIRATORY_TRACT

## 2021-05-16 MED ORDER — ORAL CARE MOUTH RINSE
15.0000 mL | Freq: Two times a day (BID) | OROMUCOSAL | Status: DC
  Administered 2021-05-16 – 2021-05-21 (×8): 15 mL via OROMUCOSAL

## 2021-05-16 MED ORDER — METHOCARBAMOL 500 MG PO TABS
500.0000 mg | ORAL_TABLET | Freq: Three times a day (TID) | ORAL | Status: DC | PRN
  Administered 2021-05-16: 500 mg via ORAL
  Filled 2021-05-16: qty 1

## 2021-05-16 MED ORDER — IPRATROPIUM BROMIDE 0.02 % IN SOLN
0.5000 mg | Freq: Four times a day (QID) | RESPIRATORY_TRACT | Status: DC
  Administered 2021-05-16: 0.5 mg via RESPIRATORY_TRACT
  Filled 2021-05-16: qty 2.5

## 2021-05-16 MED ORDER — ACETAMINOPHEN 325 MG RE SUPP
650.0000 mg | Freq: Four times a day (QID) | RECTAL | Status: DC | PRN
  Administered 2021-05-18 (×2): 650 mg via RECTAL
  Filled 2021-05-16 (×3): qty 2

## 2021-05-16 MED ORDER — LEVOTHYROXINE SODIUM 112 MCG PO TABS
112.0000 ug | ORAL_TABLET | Freq: Every day | ORAL | Status: DC
  Administered 2021-05-17 – 2021-05-21 (×4): 112 ug via ORAL
  Filled 2021-05-16 (×5): qty 1

## 2021-05-16 MED ORDER — GUAIFENESIN ER 600 MG PO TB12
600.0000 mg | ORAL_TABLET | Freq: Two times a day (BID) | ORAL | Status: DC
  Administered 2021-05-16 – 2021-05-21 (×11): 600 mg via ORAL
  Filled 2021-05-16 (×12): qty 1

## 2021-05-16 MED ORDER — ASPIRIN EC 81 MG PO TBEC
81.0000 mg | DELAYED_RELEASE_TABLET | Freq: Every day | ORAL | Status: DC
  Administered 2021-05-16 – 2021-05-21 (×6): 81 mg via ORAL
  Filled 2021-05-16 (×6): qty 1

## 2021-05-16 MED ORDER — LEVOTHYROXINE SODIUM 112 MCG PO TABS: 112.0000 ug | ORAL_TABLET | Freq: Every morning | ORAL | Status: DC

## 2021-05-16 MED ORDER — SODIUM CHLORIDE 0.9 % IV SOLN: Freq: Once | INTRAVENOUS | Status: AC

## 2021-05-16 MED ORDER — METHYLPREDNISOLONE SODIUM SUCC 40 MG IJ SOLR
40.0000 mg | Freq: Two times a day (BID) | INTRAMUSCULAR | Status: AC
  Administered 2021-05-16 (×2): 40 mg via INTRAVENOUS
  Filled 2021-05-16 (×2): qty 1

## 2021-05-16 MED ORDER — POTASSIUM CHLORIDE CRYS ER 20 MEQ PO TBCR
40.0000 meq | EXTENDED_RELEASE_TABLET | Freq: Once | ORAL | Status: AC
  Administered 2021-05-16: 40 meq via ORAL
  Filled 2021-05-16: qty 2

## 2021-05-16 MED ORDER — IPRATROPIUM BROMIDE 0.02 % IN SOLN
0.5000 mg | Freq: Three times a day (TID) | RESPIRATORY_TRACT | Status: DC
  Administered 2021-05-17: 0.5 mg via RESPIRATORY_TRACT
  Filled 2021-05-16: qty 2.5

## 2021-05-16 MED ORDER — IPRATROPIUM-ALBUTEROL 0.5-2.5 (3) MG/3ML IN SOLN: 3.0000 mL | Freq: Four times a day (QID) | RESPIRATORY_TRACT | Status: DC

## 2021-05-16 MED ORDER — GABAPENTIN 300 MG PO CAPS: 300.0000 mg | ORAL_CAPSULE | Freq: Every day | ORAL | Status: DC | PRN

## 2021-05-16 MED ORDER — DOCUSATE SODIUM 100 MG PO CAPS: 100.0000 mg | ORAL_CAPSULE | Freq: Two times a day (BID) | ORAL | Status: DC | PRN

## 2021-05-16 MED ORDER — HYDROCODONE-ACETAMINOPHEN 5-325 MG PO TABS
1.0000 | ORAL_TABLET | Freq: Four times a day (QID) | ORAL | Status: DC | PRN
  Administered 2021-05-16 – 2021-05-17 (×2): 1 via ORAL
  Filled 2021-05-16 (×2): qty 1

## 2021-05-16 MED ORDER — ENOXAPARIN SODIUM 40 MG/0.4ML IJ SOSY
40.0000 mg | PREFILLED_SYRINGE | INTRAMUSCULAR | Status: DC
  Administered 2021-05-16 – 2021-05-21 (×6): 40 mg via SUBCUTANEOUS
  Filled 2021-05-16 (×6): qty 0.4

## 2021-05-16 MED ORDER — PREDNISONE 20 MG PO TABS
40.0000 mg | ORAL_TABLET | Freq: Every day | ORAL | Status: DC
  Administered 2021-05-17: 40 mg via ORAL
  Filled 2021-05-16: qty 2

## 2021-05-16 MED ORDER — LORAZEPAM 1 MG PO TABS
1.0000 mg | ORAL_TABLET | Freq: Once | ORAL | Status: AC
  Administered 2021-05-16: 1 mg via ORAL
  Filled 2021-05-16: qty 1

## 2021-05-16 MED ORDER — ISOSORBIDE MONONITRATE ER 60 MG PO TB24
60.0000 mg | ORAL_TABLET | Freq: Every day | ORAL | Status: DC
  Administered 2021-05-16 – 2021-05-21 (×6): 60 mg via ORAL
  Filled 2021-05-16 (×6): qty 1

## 2021-05-16 MED ORDER — NICOTINE 21 MG/24HR TD PT24
21.0000 mg | MEDICATED_PATCH | Freq: Every day | TRANSDERMAL | Status: DC
  Administered 2021-05-16 – 2021-05-21 (×6): 21 mg via TRANSDERMAL
  Filled 2021-05-16 (×6): qty 1

## 2021-05-16 MED ORDER — ALBUTEROL (5 MG/ML) CONTINUOUS INHALATION SOLN
10.0000 mg/h | INHALATION_SOLUTION | Freq: Once | RESPIRATORY_TRACT | Status: AC
  Administered 2021-05-16: 10 mg/h via RESPIRATORY_TRACT
  Filled 2021-05-16: qty 20

## 2021-05-16 MED ORDER — IPRATROPIUM BROMIDE 0.02 % IN SOLN
0.5000 mg | Freq: Once | RESPIRATORY_TRACT | Status: AC
  Administered 2021-05-16: 0.5 mg via RESPIRATORY_TRACT
  Filled 2021-05-16: qty 2.5

## 2021-05-16 MED ORDER — ONDANSETRON HCL 4 MG/2ML IJ SOLN: 4.0000 mg | Freq: Four times a day (QID) | INTRAMUSCULAR | Status: DC | PRN

## 2021-05-16 MED ORDER — LEVALBUTEROL HCL 0.63 MG/3ML IN NEBU
0.6300 mg | INHALATION_SOLUTION | Freq: Four times a day (QID) | RESPIRATORY_TRACT | Status: DC
  Administered 2021-05-16: 0.63 mg via RESPIRATORY_TRACT
  Filled 2021-05-16: qty 3

## 2021-05-16 MED ORDER — METOPROLOL SUCCINATE ER 25 MG PO TB24
12.5000 mg | ORAL_TABLET | Freq: Every day | ORAL | Status: DC
  Administered 2021-05-16 – 2021-05-21 (×6): 12.5 mg via ORAL
  Filled 2021-05-16 (×6): qty 1

## 2021-05-16 MED ORDER — MAGNESIUM SULFATE 2 GM/50ML IV SOLN
2.0000 g | Freq: Once | INTRAVENOUS | Status: AC
Start: 2021-05-16 — End: 2021-05-16
  Administered 2021-05-16: 2 g via INTRAVENOUS
  Filled 2021-05-16: qty 50

## 2021-05-16 MED ORDER — CYCLOBENZAPRINE HCL 5 MG PO TABS
10.0000 mg | ORAL_TABLET | Freq: Three times a day (TID) | ORAL | Status: DC | PRN
  Administered 2021-05-16: 10 mg via ORAL
  Filled 2021-05-16: qty 1

## 2021-05-16 MED ORDER — ONDANSETRON HCL 4 MG PO TABS: 4.0000 mg | ORAL_TABLET | Freq: Four times a day (QID) | ORAL | Status: DC | PRN

## 2021-05-16 MED ORDER — ENSURE ENLIVE PO LIQD
237.0000 mL | Freq: Two times a day (BID) | ORAL | Status: DC
  Administered 2021-05-17 – 2021-05-19 (×2): 237 mL via ORAL

## 2021-05-16 MED ORDER — ALPRAZOLAM 0.5 MG PO TABS
1.0000 mg | ORAL_TABLET | Freq: Three times a day (TID) | ORAL | Status: DC | PRN
  Administered 2021-05-16 – 2021-05-21 (×11): 1 mg via ORAL
  Filled 2021-05-16 (×12): qty 2

## 2021-05-16 MED ORDER — LINACLOTIDE 145 MCG PO CAPS
145.0000 ug | ORAL_CAPSULE | Freq: Every day | ORAL | Status: DC
  Administered 2021-05-17 – 2021-05-21 (×5): 145 ug via ORAL
  Filled 2021-05-16 (×5): qty 1

## 2021-05-16 NOTE — H&P (Signed)
History and Physical    Jocelyn [name] ZOX:[id number] DOB: May 28, 1961 DOA: 05/15/2021  Referring MD/NP/PA: Shela Leff, MD PCP: Benito Mccreedy, MD  [facility]: [facility]  Chief Complaint: Shortness of breath  I have personally briefly reviewed patient's old medical records in [place]   HPI: Jocelyn [name] is a 60 y.o. 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  Acquired hypothyroidism, tobacco abuse, anxiety, and depression who presents with complaints of worsening shortness of breath over the last 3 days.  She had gone to see her PCP at the beginning of the week with concern for sinus infection for which she had been given a Z-Pak.  Patient took the medication as prescribed.  Jocelyn [name] also notes that she had recently been holding her grandchild who had been coughing.  Within 2 days patient reports that she started feeling to feel under the weather and noted that she was having difficulty breathing.  Reported having a productive cough with thick clear sputum.  She did not notice that she was wheezing, but states that it was becoming more and more difficult to breathe.  She has a albuterol inhaler at home, but had never had to use it before.  Reports that she had been told that she had COPD several years ago, but never required any treatment or had any hospitalizations previously in the past.  Jocelyn [name] admits that she smoked from the age of 60, and currently smokes approximately 1 pack cigarettes per day on average.  Other associated symptoms include chills.  Denies any fever, chest pain, nausea, abdominal pain, vomiting, diarrhea, dysuria, leg swelling, or calf pain.  She does incidentally note a past medical history blood clots that she relates to receiving her second COVID-19 vaccination.  ED Course: Upon admission into the  emergency department patient was seen to be afebrile with heart rates elevated up to 125, respirations 18-37, blood pressures maintained, and O2 saturations noted to be as low as 88% on room air with with O2 saturations currently maintained on 3.5 L nasal cannula oxygen greater than 92%.  Labs obtained from 10/15 significant for creatinine 1.04, high-sensitivity troponins were negative x2, and BNP within normal limits.  Influenza and COVID-19 screening was negative.  Patient had been given Solu-Medrol 125 mg IV, magnesium sulfate 2 g IV, DuoNeb breathing treatments, continuous albuterol breathing treatment, and Ativan 1 mg p.o.  Repeat potassium this morning was noted to be 2.5 and patient had been given potassium chloride 40 mEq p.o.  Review of Systems  Constitutional:  Positive for chills and malaise/fatigue. Negative for fever.  HENT:  Positive for congestion and sinus pain.   Eyes:  Negative for photophobia.  Respiratory:  Positive for cough, sputum production and shortness of breath.   Cardiovascular:  Negative for chest pain and leg swelling.  Gastrointestinal:  Negative for abdominal pain and vomiting.  Genitourinary:  Negative for dysuria.  Skin:  Negative for rash.  Neurological:  Negative for loss of consciousness.  Psychiatric/Behavioral:  Positive for substance abuse. Negative for memory loss.    Past Medical History:  Diagnosis Date   Anxiety    Arthritis    Chronic diastolic CHF (congestive heart failure), NYHA class 2 (HCC)    Chronic kidney disease    kidney stones hx   COPD (chronic obstructive pulmonary disease) (HCC)    Depression    HTN (  hypertension)    Hypercholesterolemia    Hypothyroidism (acquired)    Myocardial infarction ([place])    x3- pt unsure if MIs were in 1996 or 1999, had stent fixed in 2016 but no MI at that time per pt   Thyrotoxicosis    TIA (transient ischemic attack)    2013    Past Surgical History:  Procedure Laterality Date   BREAST CYST  EXCISION     CARDIAC CATHETERIZATION N/A 08/24/2015   Procedure: Left Heart Cath and Coronary Angiography;  Surgeon: Jettie Booze, MD;  Location: [facility];  Service: Cardiovascular;  Laterality: N/A;   COLONOSCOPY     CORONARY STENT PLACEMENT  1999   and then had procedure to repair in 2016   KNEE SURGERY     partial hysterec     THYROIDECTOMY       reports that she has been smoking cigarettes. She has been smoking an average of .5 packs per day. She has never used smokeless tobacco. She reports that she does not drink alcohol and does not use drugs.  Allergies  Allergen Reactions   Hydrocodone Nausea Only    Has problems with high doses    Family History  Problem Relation Age of Onset   Heart disease Mother    Stroke Mother    Hypertension Mother    Heart disease Father    Cancer Father    Berenice Primas' disease Father    Colon cancer Father        pt unsure of age onset, believes he was in his 57s   Stroke Brother    Hypertension Maternal Grandmother    Heart attack Neg Hx    Esophageal cancer Neg Hx    Stomach cancer Neg Hx    Rectal cancer Neg Hx     Prior to Admission medications   Medication Sig Start Date End Date Taking? Authorizing Provider  albuterol (PROVENTIL HFA;VENTOLIN HFA) 108 (90 Base) MCG/ACT inhaler Inhale 2 puffs into the lungs every 4 (four) hours as needed for shortness of breath.  06/21/15 05/29/20  [provider]  ALPRAZolam [name]) 1 MG tablet Take 1 tablet by mouth 3 (three) times daily as needed for anxiety. 05/10/20   [provider]  aspirin EC 81 MG tablet Take 1 tablet (81 mg total) by mouth daily. Swallow whole. 09/02/20   Jettie Booze, MD  cyclobenzaprine (FLEXERIL) 10 MG tablet Take 1 tablet (10 mg total) by mouth 3 (three) times daily as needed for muscle spasms. 03/29/17   [name], NP  docusate sodium (COLACE) 100 MG capsule Take 100 mg by mouth 2 (two) times daily as needed for constipation.  12/19/19   [provider]  estradiol (ESTRACE) 0.5 MG tablet Take 0.5 mg by mouth daily. 08/15/17   [provider]  furosemide (LASIX) 40 MG tablet Take 40 mg by mouth daily.    [provider]  gabapentin (NEURONTIN) 300 MG capsule Take 300 mg by mouth daily. 03/11/20   [provider]  HYDROcodone-acetaminophen (NORCO/VICODIN) 5-325 MG tablet Take 1-2 tablets by mouth every 6 (six) hours as needed for moderate pain. 05/21/20   Shelly Coss, MD  isosorbide mononitrate (IMDUR) 30 MG 24 hr tablet Take 2 tablets (60 mg total) by mouth daily. 07/17/16   [name], MD  levothyroxine (SYNTHROID) 100 MCG tablet Take 1 tablet (100 mcg total) by mouth daily. 07/01/20   [name], MD  levothyroxine (SYNTHROID) 112 MCG tablet TAKE  1 TABLET BY MOUTH EVERY MORNING 12/06/20   [name], MD  LINZESS 145 MCG CAPS capsule Take 145 mcg by mouth daily. 04/30/20   [provider]  losartan (COZAAR) 25 MG tablet Take 1 tablet (25 mg total) by mouth daily. 05/21/20   Shelly Coss, MD  metoprolol succinate (TOPROL-XL) 25 MG 24 hr tablet Take 0.5 tablets (12.5 mg total) by mouth daily. 05/21/20   Shelly Coss, MD  nitroGLYCERIN (NITROSTAT) 0.3 MG SL tablet TAKE 1 TABLET UNDER THE TONGUE EVERY 5 MINUTES AS NEEDED FOR CHEST PAIN 10/28/15   [provider]  rosuvastatin (CRESTOR) 20 MG tablet Take 1 tablet (20 mg total) by mouth daily. 07/20/20   Jettie Booze, MD  triamcinolone cream (KENALOG) 0.5 % Apply 1 application topically daily as needed (for sun exposure).    [provider]  UNABLE TO FIND Med Name: Garlic and Honey (homemade regimen) - two cloves a day with tsp of honey    [provider]    Physical Exam:  Constitutional: Older female who appears to be short of breath Vitals:   05/16/21 0700 05/16/21 0719 05/16/21 0800 05/16/21 0900  BP: 117/65  132/69 102/71  Pulse: (!) 116  (!) 115 (!) 114  Resp: (!) 37  (!) 22  20  Temp:  97.9 F (36.6 C)  98.6 F (37 C)  TempSrc:  Oral  Oral  SpO2: 93%  95% 94%  Weight:      Height:       Eyes: PERRL, lids and conjunctivae normal ENMT: Mucous membranes are moist. Posterior pharynx clear of any exudate or lesions.  Neck: normal, supple, no masses, no thyromegaly.  No JVD Respiratory: Mildly tachypneic with expiratory wheeze appreciated in both lung fields.  Patient currently on 3.5 L of nasal cannula oxygen with O2 saturation maintained Cardiovascular: Tachycardic, no murmurs / rubs / gallops. No extremity edema. 2+ pedal pulses. No carotid bruits.  Abdomen: no tenderness, no masses palpated. No hepatosplenomegaly. Bowel sounds positive.  Musculoskeletal: no clubbing / cyanosis. No joint deformity upper and lower extremities. Good ROM, no contractures. Normal muscle tone.  Skin: no rashes, lesions, ulcers. No induration Neurologic: CN 2-12 grossly intact. Sensation intact, DTR normal. Strength 5/5 in all 4.  Psychiatric: Normal judgment and insight. Alert and oriented x 3. Normal mood.     Labs on Admission: I have personally reviewed following labs and imaging studies  CBC: Recent Labs  Lab 05/15/21 2110  WBC 6.2  NEUTROABS 3.8  HGB 13.5  HCT 39.6  MCV 87.8  PLT 888   Basic Metabolic Panel: Recent Labs  Lab 05/15/21 2110 05/16/21 0436  NA 135  --   K 3.6 2.5*  CL 99  --   CO2 28  --   GLUCOSE 98  --   BUN 8  --   CREATININE 1.04*  --   CALCIUM 9.4  --    GFR: Estimated Creatinine Clearance: 62.7 mL/min (A) (by C-G formula based on SCr of 1.04 mg/dL (H)). Liver Function Tests: No results for input(s): AST, ALT, ALKPHOS, BILITOT, PROT, ALBUMIN in the last 168 hours. No results for input(s): LIPASE, AMYLASE in the last 168 hours. No results for input(s): AMMONIA in the last 168 hours. Coagulation Profile: No results for input(s): INR, PROTIME in the last 168 hours. Cardiac Enzymes: No results for input(s): CKTOTAL, CKMB, CKMBINDEX,  TROPONINI in the last 168 hours. BNP (last 3 results) No results for input(s): PROBNP in the last  8760 hours. HbA1C: No results for input(s): HGBA1C in the last 72 hours. CBG: No results for input(s): GLUCAP in the last 168 hours. Lipid Profile: No results for input(s): CHOL, HDL, LDLCALC, TRIG, CHOLHDL, LDLDIRECT in the last 72 hours. Thyroid Function Tests: No results for input(s): TSH, T4TOTAL, FREET4, T3FREE, THYROIDAB in the last 72 hours. Anemia Panel: No results for input(s): VITAMINB12, FOLATE, FERRITIN, TIBC, IRON, RETICCTPCT in the last 72 hours. Urine analysis:    Component Value Date/Time   COLORURINE YELLOW 05/15/2020 1654   APPEARANCEUR CLEAR 05/15/2020 1654   LABSPEC 1.010 05/15/2020 1654   PHURINE 5.5 05/15/2020 1654   GLUCOSEU NEGATIVE 05/15/2020 1654   HGBUR NEGATIVE 05/15/2020 1654   BILIRUBINUR NEGATIVE 05/15/2020 1654   KETONESUR NEGATIVE 05/15/2020 1654   PROTEINUR NEGATIVE 05/15/2020 1654   NITRITE NEGATIVE 05/15/2020 1654   LEUKOCYTESUR TRACE (A) 05/15/2020 1654   Sepsis Labs: Recent Results (from the past 240 hour(s))  Resp Panel by RT-PCR (Flu A&B, Covid) Nasopharyngeal Swab     Status: None   Collection Time: 05/15/21  9:11 PM   Specimen: Nasopharyngeal Swab; Nasopharyngeal(NP) swabs in vial transport medium  Result Value Ref Range Status   SARS Coronavirus 2 by RT PCR NEGATIVE NEGATIVE Final    Comment: (NOTE) SARS-CoV-2 target nucleic acids are NOT DETECTED.  The SARS-CoV-2 RNA is generally detectable in upper respiratory specimens during the acute phase of infection. The lowest concentration of SARS-CoV-2 viral copies this assay can detect is 138 copies/mL. A negative result does not preclude SARS-Cov-2 infection and should not be used as the sole basis for treatment or other patient management decisions. A negative result may occur with  improper specimen collection/handling, submission of specimen other than nasopharyngeal swab, presence of  viral mutation(s) within the areas targeted by this assay, and inadequate number of viral copies(<138 copies/mL). A negative result must be combined with clinical observations, patient history, and epidemiological information. The expected result is Negative.  Fact Sheet for Patients:  EntrepreneurPulse.com.au  Fact Sheet for Healthcare Providers:  IncredibleEmployment.be  This test is no t yet approved or cleared by the [place] FDA and  has been authorized for detection and/or diagnosis of SARS-CoV-2 by FDA under an Emergency Use Authorization (EUA). This EUA will remain  in effect (meaning this test can be used) for the duration of the COVID-19 declaration under Section 564(b)(1) of the Act, 21 U.S.C.section 360bbb-3(b)(1), unless the authorization is terminated  or revoked sooner.       Influenza A by PCR NEGATIVE NEGATIVE Final   Influenza B by PCR NEGATIVE NEGATIVE Final    Comment: (NOTE) The Xpert Xpress SARS-CoV-2/FLU/RSV plus assay is intended as an aid in the diagnosis of influenza from Nasopharyngeal swab specimens and should not be used as a sole basis for treatment. Nasal washings and aspirates are unacceptable for Xpert Xpress SARS-CoV-2/FLU/RSV testing.  Fact Sheet for Patients: EntrepreneurPulse.com.au  Fact Sheet for Healthcare Providers: IncredibleEmployment.be  This test is not yet approved or cleared by the [place] FDA and has been authorized for detection and/or diagnosis of SARS-CoV-2 by FDA under an Emergency Use Authorization (EUA). This EUA will remain in effect (meaning this test can be used) for the duration of the COVID-19 declaration under Section 564(b)(1) of the Act, 21 U.S.C. section 360bbb-3(b)(1), unless the authorization is terminated or revoked.  Performed at [facility], [address] [place], [place] [address]      Radiological Exams  on Admission: [facility] [place]  1 View  Result Date: 05/15/2021 CLINICAL DATA:  Cough and shortness of breath. EXAM: PORTABLE CHEST 1 VIEW COMPARISON:  May 16, 2020 FINDINGS: The heart size and mediastinal contours are within normal limits. No focal consolidation. No visible pleural effusion or pneumothorax. The visualized skeletal structures are unremarkable. IMPRESSION: No acute cardiopulmonary disease. Electronically Signed   By: Dahlia Bailiff M.D.   On: 05/15/2021 21:13    EKG: Independently reviewed.  Sinus tachycardia at 108 bpm  Assessment/Plan Acute respiratory failure with hypoxia secondary to COPD exacerbation: Patient reports having a new productive cough with thick sputum production and worsening shortness of breath.  Noted to be hypoxic down to 88%, but O2 saturation currently maintained on 3.5 L of nasal cannula oxygen greater than 92%.  Wheezing appreciated on physical exam.  Chest x-ray was otherwise clear.  Patient had just recently completed Z-Pak earlier in this week for sinus infection. -Admit to a progressive bed -COPD focus order set utilized -Continuous pulse oximetry with nasal cannula oxygen to maintain O2 saturation greater than 92% -Add on D-dimer given patient's previous history of -Levalbuterol and ipratropium breathing treatments every 6 hours  -Solu-Medrol 40 mg IV every 12 hours x1 day, then start prednisone 40 mg p.o. tomorrow morning -Check O2 saturations with ambulation tomorrow morning. -Mucinex  Hypokalemia: Acute.  On admission potassium was noted to be within normal limits at 3.6, but repeat 2.5.  Patient had been given 40 mEq of potassium chloride. -Check magnesium -Continue to monitor and replace as needed  Acute kidney injury: Patient was noted to have creatinine up to 1.25 when rechecked today, but baseline creatinine previously had been around 0.8. -Held furosemide -Gentle IV fluids 75 mL/h x 1 L -Recheck kidney function in a.m. 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 -Follow-up D-dimer and if elevated would check CT angiogram of the chest given prior history of apical thrombus  CAD: Patient with prior history of stents. -Continue aspirin, beta-blocker  Essential hypertension: Blood pressures have been stable. -Continue current blood pressure regimen except for furosemide given AKI  Combined heart failure: Chronic.  Last EF noted to be 45 to 50% with grade 1 diastolic dysfunction in 0/2725.  Patient appears to be euvolemic at this time.  BNP was 37.6. -Monitor intake and output -Daily weights -Resume furosemide when medically appropriate  Hypothyroidism -Check TSH -Continue levothyroxine  Hyperlipidemia: Patient was last prescribed Crestor in May of this year.  Last LDL was noted to be 207 on 10/29/2020. -May need to discuss with patient why she is no longer on cholesterol-lowering medication as it would appears she still needs to be on something  Anxiety -Continue Ativan as needed for anxiety  Tobacco abuse -Counseled the patient for 5 minutes in regards to need of cessation of tobacco -Nicotine patch provided  DVT prophylaxis: Lovenox Code Status: Full Family Communication: Daughter updated over the phone Disposition Plan: Hopefully discharge home once medically stable Consults called: None Admission status: Inpatient, require more than 2 midnight stay  Norval Morton MD [facility]   If 7PM-7AM, please contact night-coverage   05/16/2021, 9:30 AM

## 2021-05-16 NOTE — Plan of Care (Signed)
[facility] will assume care on arrival to accepting facility. Until arrival, care as per EDP. However, [facility] available 24/7 for questions and assistance.  Nursing staff please page [facility] Admits and Consults ([phone]) as soon as the patient arrives the hospital.   

## 2021-05-16 NOTE — Progress Notes (Signed)
   05/16/21 0900  Assess: MEWS Score  Temp 98.6 F (37 C)  BP 102/71  Pulse Rate (!) 114  ECG Heart Rate (!) 114  Resp 20  Level of Consciousness Alert  SpO2 94 %  O2 Device Nasal Cannula  O2 Flow Rate (L/min) 3.5 L/min  Assess: MEWS Score  MEWS Temp 0  MEWS Systolic 0  MEWS Pulse 2  MEWS RR 0  MEWS LOC 0  MEWS Score 2  MEWS Score Color Yellow  Assess: if the MEWS score is Yellow or Red  Were vital signs taken at a resting state? Yes  Focused Assessment No change from prior assessment  Early Detection of Sepsis Score *See Row Information* Low  MEWS guidelines implemented *See Row Information* Yes  Treat  Pain Scale 0-10  Pain Score 8  Pain Type Acute pain  Pain Location Head  Pain Descriptors / Indicators Aching  Pain Intervention(s) RN made aware  Take Vital Signs  Increase Vital Sign Frequency  Yellow: Q 2hr X 2 then Q 4hr X 2, if remains yellow, continue Q 4hrs  Escalate  MEWS: Escalate Yellow: discuss with charge nurse/RN and consider discussing with provider and RRT  Notify: Charge Nurse/RN  Name of Charge Nurse/RN Notified Jessica, RN  Date Charge Nurse/RN Notified 05/16/21  Time Charge Nurse/RN Notified 1048  Document  Patient Outcome Other (Comment) (stable in room)  Progress note created (see row info) Yes

## 2021-05-17 ENCOUNTER — Inpatient Hospital Stay (HOSPITAL_COMMUNITY): Payer: Medicare Other

## 2021-05-17 LAB — CBC
HCT: 38.1 % (ref 36.0–46.0)
Hemoglobin: 12.6 g/dL (ref 12.0–15.0)
MCH: 29.6 pg (ref 26.0–34.0)
MCHC: 33.1 g/dL (ref 30.0–36.0)
MCV: 89.6 fL (ref 80.0–100.0)
Platelets: 303 10*3/uL (ref 150–400)
RBC: 4.25 MIL/uL (ref 3.87–5.11)
RDW: 14 % (ref 11.5–15.5)
WBC: 17.8 10*3/uL — ABNORMAL HIGH (ref 4.0–10.5)
nRBC: 0 % (ref 0.0–0.2)

## 2021-05-17 LAB — BASIC METABOLIC PANEL
Anion gap: 9 (ref 5–15)
BUN: 22 mg/dL — ABNORMAL HIGH (ref 6–20)
CO2: 22 mmol/L (ref 22–32)
Calcium: 9.4 mg/dL (ref 8.9–10.3)
Chloride: 100 mmol/L (ref 98–111)
Creatinine, Ser: 1.1 mg/dL — ABNORMAL HIGH (ref 0.44–1.00)
GFR, Estimated: 58 mL/min — ABNORMAL LOW (ref >60)
Glucose, Bld: 144 mg/dL — ABNORMAL HIGH (ref 70–99)
Potassium: 4.8 mmol/L (ref 3.5–5.1)
Sodium: 131 mmol/L — ABNORMAL LOW (ref 135–145)

## 2021-05-17 LAB — PROCALCITONIN: Procalcitonin: 41.44 ng/mL

## 2021-05-17 MED ORDER — LEVALBUTEROL HCL 0.63 MG/3ML IN NEBU
0.6300 mg | INHALATION_SOLUTION | Freq: Four times a day (QID) | RESPIRATORY_TRACT | Status: DC | PRN
  Administered 2021-05-17: 0.63 mg via RESPIRATORY_TRACT
  Filled 2021-05-17 (×3): qty 3

## 2021-05-17 MED ORDER — BENZONATATE 100 MG PO CAPS
100.0000 mg | ORAL_CAPSULE | Freq: Three times a day (TID) | ORAL | Status: DC | PRN
  Administered 2021-05-17: 100 mg via ORAL
  Filled 2021-05-17 (×2): qty 1

## 2021-05-17 MED ORDER — SODIUM CHLORIDE 0.9 % IV SOLN
1.0000 g | INTRAVENOUS | Status: DC
  Administered 2021-05-17: 1 g via INTRAVENOUS
  Filled 2021-05-17: qty 10

## 2021-05-17 MED ORDER — ALBUTEROL SULFATE (2.5 MG/3ML) 0.083% IN NEBU
2.5000 mg | INHALATION_SOLUTION | Freq: Once | RESPIRATORY_TRACT | Status: AC | PRN
  Administered 2021-05-17: 2.5 mg via RESPIRATORY_TRACT

## 2021-05-17 MED ORDER — DOXYCYCLINE HYCLATE 100 MG PO TABS
100.0000 mg | ORAL_TABLET | Freq: Two times a day (BID) | ORAL | Status: DC
  Administered 2021-05-17 (×2): 100 mg via ORAL
  Filled 2021-05-17 (×2): qty 1

## 2021-05-17 MED ORDER — ADULT MULTIVITAMIN W/MINERALS CH
1.0000 | ORAL_TABLET | Freq: Every day | ORAL | Status: DC
  Administered 2021-05-17 – 2021-05-21 (×5): 1 via ORAL
  Filled 2021-05-17 (×5): qty 1

## 2021-05-17 MED ORDER — GUAIFENESIN-DM 100-10 MG/5ML PO SYRP
5.0000 mL | ORAL_SOLUTION | Freq: Four times a day (QID) | ORAL | Status: DC | PRN
  Administered 2021-05-17: 5 mL via ORAL
  Filled 2021-05-17: qty 5

## 2021-05-17 MED ORDER — ALBUTEROL SULFATE (2.5 MG/3ML) 0.083% IN NEBU
INHALATION_SOLUTION | RESPIRATORY_TRACT | Status: AC
  Filled 2021-05-17: qty 3

## 2021-05-17 MED ORDER — FUROSEMIDE 10 MG/ML IJ SOLN
40.0000 mg | Freq: Once | INTRAMUSCULAR | Status: AC
  Administered 2021-05-17: 40 mg via INTRAVENOUS
  Filled 2021-05-17: qty 4

## 2021-05-17 NOTE — Progress Notes (Signed)
PROGRESS NOTE  [place]  DOB: 02-Aug-1960  PCP: Benito Mccreedy, MD GXQ:[id number]  DOA: 05/15/2021  LOS: 1 day  Hospital Day: 3   Chief Complaint  Patient presents with   Shortness of Breath   Brief narrative: Jocelyn [name] is a 60 y.o. 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 Patient presented to the ED on 10/15 with complaint of worsening shortness of breath, productive cough for 3 days not responding to Z-Pak as an outpatient.   In the ED, she was afebrile, tachycardic 225, oxygen saturation was low at 88% on room air and improved on 3 L oxygen by nasal cannula. Labs with unremarkable CBC, creatinine 1.04, troponin normal Influenza and COVID-19 screening test were normal CT angio chest did not show any evidence of pulm embolism but showed patchy groundglass opacities consistent with a postinflammatory nature predominantly within the upper lobes. Patient continued to feel dyspneic, continue to require supplemental oxygen and hence admitted to hospital service.   Subjective: Patient was seen and examined this morning.  Pleasant middle-aged African-American female.  On 2 L oxygen at the time of my evaluation.  Her biggest complaints time is bouts of violent cough.  Assessment/Plan: Acute respiratory failure with hypoxia COPD exacerbation Multifocal pneumonia Current daily smoker -Presented with progressively worsening shortness of breath for 3 days in the setting of chronic half a pack per day smoking. -CT chest showed patchy groundglass opacities predominantly in the upper lobes. -Procalcitonin level significantly elevated to over 40 today.  WBC count is high today probably because of steroids.  It was normal on admission. -Patient was given Solu-Medrol IV 40  mg twice daily yesterday and is on prednisone 40 mg daily from today. -For multifocal pneumonia, I would add IV Rocephin and oral doxycycline. -Initially required 3 to 4 L of oxygen.  Currently requiring less.  Wean down as tolerated. -Continue bronchodilators, inhalers, Mucinex.  Her biggest complaint is cough at this time.  Added Robitussin-DM as needed. -Check ambulatory oxygen requirement. -Counseled to quit smoking.  Nicotine patch offered. Recent Labs  Lab 05/15/21 2110 05/17/21 0153  WBC 6.2 17.8*  PROCALCITON  --  41.44   AKI -Creatinine 0.82 on 05/19/2020.  It worsened to 1.25 on 10/16.  Was given IV fluid.  Gradually improving now.  I would stop further IV hydration and encourage oral hydration. Recent Labs    05/18/20 0546 05/19/20 0519 05/15/21 2110 05/16/21 0948 05/17/21 0153  BUN 16 14 8 9  22*  CREATININE 0.98 0.82 1.04* 1.25* 1.10*   Hypokalemia -Potassium was low at 2.5 on 10/16.  Improved with replacement. Recent Labs  Lab 05/15/21 2110 05/16/21 0436 05/16/21 0948 05/17/21 0153  K 3.6 2.5* 3.8 4.8  MG  --   --  2.3  --    History of combined systolic and diastolic CHF Essential hypertension -Last echo from January 2022 with EF 45 to 50% and grade 1 diastolic dysfunction. -Home meds include Lasix 40 mg daily, Imdur 60 mg daily, not taking Toprol per med list -Currently heart rate is mostly elevated, blood pressure in normal range. -Continue Imdur.  Lasix on hold. -She is also been started on Toprol 12.5 mg daily.  Continue to monitor heart rate.   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  CAD with h/o stents HLD -Continue aspirin, beta-blocker -It seems she is prescribed Crestor in May of this year.  Not sure why she is not taking any statin at home.    Hypothyroidism -Continue  Synthroid.   Anxiety -Continue Ativan as needed for anxiety   Mobility: Encourage ambulation Code Status:   Code Status: Full Code  Nutritional status: Body mass index is 33.47 kg/m.     Diet:  Diet Order             Diet Heart Room service appropriate? Yes; Fluid consistency: Thin  Diet effective now                  DVT prophylaxis:  enoxaparin (LOVENOX) injection 40 mg Start: 05/16/21 1030   Antimicrobials: IV Rocephin, oral doxycycline Fluid: Stop IV fluid Consultants: None Family Communication: Daughter on the phone  Status is: Inpatient  Remains inpatient appropriate because: Pneumonia, COPD flareup requiring IV antibiotics steroids, oxygen  Dispo: The patient is from: Home              Anticipated d/c is to: Home in 1 to 2 days              Patient currently is not medically stable to d/c.   Difficult to place patient No     Infusions:   albuterol Stopped (05/16/21 0426)   cefTRIAXone (ROCEPHIN)  IV      Scheduled Meds:  aspirin EC  81 mg Oral Daily   doxycycline  100 mg Oral Q12H   enoxaparin (LOVENOX) injection  40 mg Subcutaneous Q24H   feeding supplement  237 mL Oral BID BM   guaiFENesin  600 mg Oral BID   isosorbide mononitrate  60 mg Oral Daily   levothyroxine  112 mcg Oral Q0600   linaclotide  145 mcg Oral Daily   mouth rinse  15 mL Mouth Rinse BID   metoprolol succinate  12.5 mg Oral Daily   nicotine  21 mg Transdermal Daily   predniSONE  40 mg Oral Q breakfast   sodium chloride flush  3 mL Intravenous Q12H    Antimicrobials: Anti-infectives (From admission, onward)    Start     Dose/Rate Route Frequency Ordered Stop   05/17/21 1215  cefTRIAXone (ROCEPHIN) 1 g in sodium chloride 0.9 % 100 mL IVPB        1 g 200 mL/hr over 30 Minutes Intravenous Every 24 hours 05/17/21 1117     05/17/21 1215  doxycycline (VIBRA-TABS) tablet 100 mg        100 mg Oral Every 12 hours 05/17/21 1117         PRN meds: acetaminophen **OR**  acetaminophen, ALPRAZolam, cyclobenzaprine, docusate sodium, gabapentin, guaiFENesin-dextromethorphan, HYDROcodone-acetaminophen, levalbuterol, ondansetron **OR** ondansetron (ZOFRAN) IV   Objective: Vitals:   05/17/21 0800 05/17/21 1030  BP:  (!) 104/52  Pulse:  (!) 104  Resp:  18  Temp:  98.4 F (36.9 C)  SpO2: 94% 90%    Intake/Output Summary (Last 24 hours) at 05/17/2021 1128 Last data filed at 05/17/2021 1000 Gross per 24 hour  Intake 720 ml  Output 900 ml  Net -180 ml   Filed Weights   05/15/21 1926  Weight: 88.5 kg   Weight change:  Body mass index is 33.47 kg/m.   Physical Exam: General exam: Pleasant, middle-aged African-American female.  Not in distress Skin:  No rashes, lesions or ulcers. HEENT: Atraumatic, normocephalic, no obvious bleeding Lungs: Mild end expiratory wheezing bilaterally, bouts of violent cough on deep breathing CVS: Regular rate and rhythm, no murmur GI/Abd soft, nontender, nondistended, bowel sound present CNS: Alert, awake, oriented x3 Psychiatry: Mood appropriate Extremities: No pedal edema, no calf tenderness  Data Review: I have personally reviewed the laboratory data and studies available.  Recent Labs  Lab 05/15/21 2110 05/17/21 0153  WBC 6.2 17.8*  NEUTROABS 3.8  --   HGB 13.5 12.6  HCT 39.6 38.1  MCV 87.8 89.6  PLT 287 303   Recent Labs  Lab 05/15/21 2110 05/16/21 0436 05/16/21 0948 05/17/21 0153  NA 135  --  136 131*  K 3.6 2.5* 3.8 4.8  CL 99  --  101 100  CO2 28  --  19* 22  GLUCOSE 98  --  219* 144*  BUN 8  --  9 22*  CREATININE 1.04*  --  1.25* 1.10*  CALCIUM 9.4  --  10.1 9.4  MG  --   --  2.3  --     F/u labs ordered Unresulted Labs (From admission, onward)     Start     Ordered   05/18/21 0500  CBC with Differential/Platelet  Daily,   R     Question:  Specimen collection method  Answer:  Lab=Lab collect   05/17/21 0828   05/18/21 0349  Basic metabolic panel  Daily,   R     Question:  Specimen  collection method  Answer:  Lab=Lab collect   05/17/21 0828   05/18/21 0500  Procalcitonin  Daily,   R     Question:  Specimen collection method  Answer:  Lab=Lab collect   05/17/21 1791            Signed, Terrilee Croak, MD [facility] 05/17/2021

## 2021-05-17 NOTE — Progress Notes (Signed)
Overnight event  Notified by RN that patient was in respiratory distress and rapid response called.  She had audible wheezing and very labored breathing, tachypneic with respiratory rate in the 40s.  Desatted to the 60s.  She was given an albuterol nebulizer treatment.   Patient seen and examined at bedside.  Some improvement in work of breathing after albuterol neb but still wheezing.  Tachycardic with heart rate in the 110s.  Satting around 90% on 3 L supplemental oxygen.  Patient denies chest pain.  Echo done January 2022 showing EF 45 to 50% and grade 1 diastolic dysfunction.  She was previously receiving p.o. Lasix but it was stopped.  Creatinine stable.  CTA done yesterday negative for PE but showing patchy groundglass opacities within the upper lobes concerning for multifocal pneumonia.  WBC 17.8 and procalcitonin 41.44.  She is receiving doxycycline and ceftriaxone.  Stat chest x-ray ordered.  IV Lasix 40 mg x 1 ordered.  PCCM consulted, appreciate help.

## 2021-05-17 NOTE — Progress Notes (Signed)
Spoke to Dr. Marlowe Sax regarding NS infusion order missing a dosage amount. Order states 85mL/hr NS once, but does not list a dosage. [place] clarified order should be a total of 1L. Note made in my administration and I will pass on this information to the day shift nurse.

## 2021-05-17 NOTE — Significant Event (Addendum)
Rapid Response Event Note   Reason for Call :  Wheezing and shortness of breath  Initial Focused Assessment:  On my arrival, patient is labored with her breathing and there are audible wheezes from the door, and visably in acute respiratory distress. On auscultation, there are expiratory wheezes throughout and some crackles in the bases. She is warm to the touch and her mentation is intact.   VS: HR 115, BP 118/72, RR 40, Sats 89% on 4L [place] Temp 99.3 oral  Interventions:  Albuterol treatment  CXR  Plan of Care:  Lasix PCCM consult NPO  Event Summary:  She remains tachypneic, continues to have crackles and wheezes in the lower lobes. Dr Marlowe Sax is placing a CCM consult, CXR and lasix. There is some concern for possible pneumonia, so patient will remain NPO for now.   Please call back if further assistance is needed.    MD Notified: Paged Dr. Marlowe Sax Call Time: 2301  Arrival Time: 2305 End Time: 2182  Minka Knight, RN   ADDENDUM @0234 : Called back for patient being more tachycardic 140's, tachypneic 44, temp 103. Patient is shivering and in rigors on my exam. Dr. Marlowe Sax ordered rectal tylenol, STAT labs, broadening antibiotics, and is re-consulting with CCM. She will tx to 2M08.

## 2021-05-17 NOTE — Progress Notes (Signed)
Initial Nutrition Assessment  DOCUMENTATION CODES:  Obesity unspecified  INTERVENTION:  Continue Ensure BID.  Add MVI with minerals daily.  Continue to encourage PO intake.  NUTRITION DIAGNOSIS:  Increased nutrient needs related to acute illness (acute respiratory failure) as evidenced by estimated needs.  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  Acquired hypothyroidism, tobacco abuse, anxiety, and depression who presents with complaints of worsening shortness of breath over the last 3 days. Admitted with acute on chronic respiratory failure with hypoxia.  Per Epic, pt ate 100% of breakfast this morning. Pt reports that she has been trying to lose weight intentionally. Her smoking cessation drug, likely Zyban, caused her to lose some weight. She has since stopped taking this medication, but the weight has stayed off.  Pt reports a 30-35 lb loss. Per Epic, pt has lost ~21 lbs (9.6%) in the last 9 months, which is not necessarily significant for the time frame.  Recommend continuing Ensure BID and adding MVI with minerals daily.  Supplements: Ensure BID  Medications: reviewed; Synthroid, prednisone  Labs: reviewed; Na 131 (L), Glucose 114 (H), BUN 22 (H - trending up), Crt 1.10 (H - trending down)  NUTRITION - FOCUSED PHYSICAL EXAM: Flowsheet Row Most Recent Value  Orbital Region No depletion  Upper Arm Region Mild depletion  Thoracic and Lumbar Region No depletion  Buccal Region No depletion  Temple Region No depletion  Clavicle Bone Region Mild depletion  Clavicle and Acromion Bone Region No depletion  Scapular Bone Region No depletion  Dorsal  Hand No depletion  Patellar Region No depletion  Anterior Thigh Region No depletion  Posterior Calf Region No depletion  Edema (RD Assessment) None  Hair Reviewed  Eyes Reviewed  Mouth Reviewed  Skin Reviewed  Nails Reviewed   Diet Order:   Diet Order             Diet Heart Room service appropriate? Yes; Fluid consistency: Thin  Diet effective now                  EDUCATION NEEDS:  Education needs have been addressed  Skin:  Skin Assessment: Reviewed RN Assessment  Last BM:  05/16/21  Height:  Ht Readings from Last 1 Encounters:  05/15/21 5\' 4"  (1.626 m)   Weight:  Wt Readings from Last 1 Encounters:  05/15/21 88.5 kg   BMI:  Body mass index is 33.47 kg/m.  Estimated Nutritional Needs:  Kcal:  5038-8828 Protein:  95-110 grams Fluid:  >1.75 L  Derrel Nip, RD, LDN (she/her/hers) Registered Dietitian I After-Hours/Weekend Pager # in [place]

## 2021-05-17 NOTE — Progress Notes (Signed)
SpO2 eval   SpO2 when Pt at RA while resting: 87%   SpO2 when Pt at 2 L while resting: 92%

## 2021-05-17 NOTE — Progress Notes (Signed)
SpO2 eval  SpO2 when Pt at RA while resting: 87%  SpO2 when Pt at 2 L while resting: 92%

## 2021-05-18 DIAGNOSIS — J9621 Acute and chronic respiratory failure with hypoxia: Secondary | ICD-10-CM

## 2021-05-18 DIAGNOSIS — N179 Acute kidney failure, unspecified: Secondary | ICD-10-CM

## 2021-05-18 DIAGNOSIS — I25118 Atherosclerotic heart disease of native coronary artery with other forms of angina pectoris: Secondary | ICD-10-CM

## 2021-05-18 DIAGNOSIS — J441 Chronic obstructive pulmonary disease with (acute) exacerbation: Secondary | ICD-10-CM

## 2021-05-18 LAB — RESPIRATORY PANEL BY PCR

## 2021-05-18 LAB — BASIC METABOLIC PANEL
Anion gap: 12 (ref 5–15)
BUN: 21 mg/dL — ABNORMAL HIGH (ref 6–20)
CO2: 24 mmol/L (ref 22–32)
Calcium: 10.2 mg/dL (ref 8.9–10.3)
Chloride: 99 mmol/L (ref 98–111)
Creatinine, Ser: 1.14 mg/dL — ABNORMAL HIGH (ref 0.44–1.00)
GFR, Estimated: 55 mL/min — ABNORMAL LOW (ref >60)
Glucose, Bld: 108 mg/dL — ABNORMAL HIGH (ref 70–99)
Potassium: 4.3 mmol/L (ref 3.5–5.1)
Sodium: 135 mmol/L (ref 135–145)

## 2021-05-18 LAB — BLOOD GAS, ARTERIAL
Acid-base deficit: 0.1 mmol/L (ref 0.0–2.0)
Bicarbonate: 23.9 mmol/L (ref 20.0–28.0)
Drawn by: 23604
FIO2: 32
O2 Saturation: 89.2 %
Patient temperature: 37
pCO2 arterial: 38.1 mmHg (ref 32.0–48.0)
pH, Arterial: 7.414 (ref 7.350–7.450)
pO2, Arterial: 55.7 mmHg — ABNORMAL LOW (ref 83.0–108.0)

## 2021-05-18 LAB — CBC WITH DIFFERENTIAL/PLATELET
Abs Immature Granulocytes: 0.05 10*3/uL (ref 0.00–0.07)
Basophils Absolute: 0 10*3/uL (ref 0.0–0.1)
Basophils Relative: 0 %
Eosinophils Absolute: 0 10*3/uL (ref 0.0–0.5)
Eosinophils Relative: 0 %
HCT: 47.1 % — ABNORMAL HIGH (ref 36.0–46.0)
Hemoglobin: 15.5 g/dL — ABNORMAL HIGH (ref 12.0–15.0)
Immature Granulocytes: 0 %
Lymphocytes Relative: 10 %
Lymphs Abs: 1.5 10*3/uL (ref 0.7–4.0)
MCH: 29.8 pg (ref 26.0–34.0)
MCHC: 32.9 g/dL (ref 30.0–36.0)
MCV: 90.4 fL (ref 80.0–100.0)
Monocytes Absolute: 0.9 10*3/uL (ref 0.1–1.0)
Monocytes Relative: 6 %
Neutro Abs: 13 10*3/uL — ABNORMAL HIGH (ref 1.7–7.7)
Neutrophils Relative %: 84 %
Platelets: 341 10*3/uL (ref 150–400)
RBC: 5.21 MIL/uL — ABNORMAL HIGH (ref 3.87–5.11)
RDW: 14.2 % (ref 11.5–15.5)
WBC: 15.5 10*3/uL — ABNORMAL HIGH (ref 4.0–10.5)
nRBC: 0 % (ref 0.0–0.2)

## 2021-05-18 LAB — CBC
HCT: 46.5 % — ABNORMAL HIGH (ref 36.0–46.0)
Hemoglobin: 15.7 g/dL — ABNORMAL HIGH (ref 12.0–15.0)
MCH: 30.1 pg (ref 26.0–34.0)
MCHC: 33.8 g/dL (ref 30.0–36.0)
MCV: 89.3 fL (ref 80.0–100.0)
Platelets: 292 10*3/uL (ref 150–400)
RBC: 5.21 MIL/uL — ABNORMAL HIGH (ref 3.87–5.11)
RDW: 14.2 % (ref 11.5–15.5)
WBC: 14.3 10*3/uL — ABNORMAL HIGH (ref 4.0–10.5)
nRBC: 0.1 % (ref 0.0–0.2)

## 2021-05-18 LAB — LACTIC ACID, PLASMA: Lactic Acid, Venous: 2.8 mmol/L (ref 0.5–1.9)

## 2021-05-18 LAB — MRSA NEXT GEN BY PCR, NASAL: MRSA by PCR Next Gen: NOT DETECTED

## 2021-05-18 LAB — GLUCOSE, CAPILLARY
Glucose-Capillary: 121 mg/dL — ABNORMAL HIGH (ref 70–99)
Glucose-Capillary: 136 mg/dL — ABNORMAL HIGH (ref 70–99)

## 2021-05-18 LAB — BRAIN NATRIURETIC PEPTIDE: B Natriuretic Peptide: 79 pg/mL (ref 0.0–100.0)

## 2021-05-18 LAB — PROCALCITONIN: Procalcitonin: 0.11 ng/mL

## 2021-05-18 MED ORDER — VANCOMYCIN HCL IN DEXTROSE 1-5 GM/200ML-% IV SOLN: 1000.0000 mg | INTRAVENOUS | Status: DC

## 2021-05-18 MED ORDER — METHYLPREDNISOLONE SODIUM SUCC 125 MG IJ SOLR
80.0000 mg | Freq: Two times a day (BID) | INTRAMUSCULAR | Status: DC
  Administered 2021-05-18 – 2021-05-20 (×4): 80 mg via INTRAVENOUS
  Filled 2021-05-18 (×4): qty 2

## 2021-05-18 MED ORDER — PIPERACILLIN-TAZOBACTAM 3.375 G IVPB 30 MIN
3.3750 g | INTRAVENOUS | Status: DC
  Filled 2021-05-18: qty 50

## 2021-05-18 MED ORDER — SODIUM CHLORIDE 0.9 % IV SOLN
2.0000 g | Freq: Two times a day (BID) | INTRAVENOUS | Status: DC
  Administered 2021-05-18: 2 g via INTRAVENOUS
  Filled 2021-05-18: qty 2

## 2021-05-18 MED ORDER — VANCOMYCIN HCL 1750 MG/350ML IV SOLN
1750.0000 mg | Freq: Once | INTRAVENOUS | Status: AC
  Administered 2021-05-18: 1750 mg via INTRAVENOUS
  Filled 2021-05-18: qty 350

## 2021-05-18 MED ORDER — BUPROPION HCL ER (XL) 150 MG PO TB24
300.0000 mg | ORAL_TABLET | Freq: Every day | ORAL | Status: DC
  Administered 2021-05-18 – 2021-05-21 (×4): 300 mg via ORAL
  Filled 2021-05-18: qty 1
  Filled 2021-05-18 (×3): qty 2

## 2021-05-18 MED ORDER — SODIUM CHLORIDE 0.9 % IV SOLN
1.0000 g | INTRAVENOUS | Status: DC
  Administered 2021-05-18 – 2021-05-20 (×3): 1 g via INTRAVENOUS
  Filled 2021-05-18 (×4): qty 10

## 2021-05-18 MED ORDER — PIPERACILLIN-TAZOBACTAM 3.375 G IVPB: 3.3750 g | Freq: Three times a day (TID) | INTRAVENOUS | Status: DC

## 2021-05-18 MED ORDER — IPRATROPIUM-ALBUTEROL 0.5-2.5 (3) MG/3ML IN SOLN
3.0000 mL | RESPIRATORY_TRACT | Status: DC
  Administered 2021-05-18 – 2021-05-19 (×11): 3 mL via RESPIRATORY_TRACT
  Filled 2021-05-18 (×11): qty 3

## 2021-05-18 MED ORDER — CHLORHEXIDINE GLUCONATE CLOTH 2 % EX PADS
6.0000 | MEDICATED_PAD | Freq: Every day | CUTANEOUS | Status: DC
  Administered 2021-05-18 – 2021-05-19 (×2): 6 via TOPICAL

## 2021-05-18 MED ORDER — METHYLPREDNISOLONE SODIUM SUCC 125 MG IJ SOLR
80.0000 mg | INTRAMUSCULAR | Status: DC
  Administered 2021-05-18: 80 mg via INTRAVENOUS
  Filled 2021-05-18: qty 2

## 2021-05-18 NOTE — Progress Notes (Addendum)
NAME:  Jocelyn [name], MRN:  [id number], DOB:  1961-03-29, LOS: 2 ADMISSION DATE:  05/15/2021, CONSULTATION DATE:  05/18/21 REFERRING MD:  Oneal Grout, CHIEF COMPLAINT: tachycardia, tachypnea.   History of Present Illness:  Jocelyn [name] is a 60 year old woman with hx of HTN, HLD, CAD s/p stents, COPD, HFpEF, hx of LV thrombus w renal and splenic infarcts in 2021, presented with SOB x 3 days on 05/16/21.  Productive cough with clear sputum.  + sick contacts.   Increased wheezing.  No hx of tx for copd, had albuterol at home but never had used it.   Found to be tachycardic and hypoxemic, started 5L [place] Was given solumedrol and magnesium.   Lasix was held due to AKI.  CTA done - no PE  I/o negative 1280 since admission  This evening has developed intermittent tachypnea, wheezing and tachycardia.  Now has developed a fever.  Increasing oxygen needs.   Pertinent  Medical History  Hypothyroidism HTN HLD CAD s/p stents  CHF  COPD Current smoker 1 ppd  CKD Hx of TIA Hx of LV thrombus, renal and splenic infarcts 2021  Home meds: xanax prn, asa, flexeril, colace, estradiol, lasix, gabapentin, norco, imdur 30mg  daily, synthroid 142mcg daily, losartan, metoprolol, nitroglycerin, rosuvastatin Significant Hospital Events: Including procedures, antibiotic start and stop dates in addition to other pertinent events   10/18 Transferred to the ICU  Interim History / Subjective:  Transferred to the ICU for respiratory distress. This am improved and comfortable appearing. Reports dyspnea has improved. Objective   Blood pressure 137/80, pulse (!) 108, temperature 99.5 F (37.5 C), temperature source Oral, resp. rate (!) 21, height 5\' 4"  (1.626 m), weight 86.1 kg, SpO2 94 %.        Intake/Output Summary (Last 24 hours) at 05/18/2021 0959 Last data filed at 05/18/2021 0500 Gross per 24 hour  Intake 567.79 ml  Output 1250 ml  Net -682.21 ml   Filed Weights   05/15/21 1926 05/18/21 0342   Weight: 88.5 kg 86.1 kg   Physical Exam: General: Well-appearing, no acute distress HENT: [place], AT, OP clear, MMM Eyes: EOMI, no scleral icterus Respiratory: Diminished breath sound bilaterally.  +Air entry. No crackles, wheezing or rales Cardiovascular: RRR, -M/R/G, no JVD GI: BS+, soft, nontender Extremities:-Edema,-tenderness Neuro: AAO x4, CNII-XII grossly intact  WBC 14 improving  Resolved Hospital Problem list     Assessment & Plan:   Acute hypoxemic respiratory failure COPD exacerbation secondary to RSV CTA with bilateral upper lobe ground glass opacities --De-escalate to ceftriaxone and doxycycline for five days total --F/u cultures, strep pneumo and legionella studies --Wean supplemental oxygen for goal >90% --Continue IV steroids --Scheduled duonebs. Recommend starting Incruse prior to discharge  Mild OSA --NIV nightly   AKI --Monitor UOP/Cr  Chronic combined heart failure HTN --S/p lasix --Hold diuretics for now due to euvolemia --Continue imdur and toprol  CAD, s/p stents Hx LV thrombus - resolved on 08/2020 echocardiogram --No indication for systemic anticoagulation  Anxiety --PRN xanax --Restart home wellbutrin  Hypothyroidism --Continue synthroid  Best Practice (right click and "Reselect all SmartList Selections" daily)   Diet/type: Regular consistency (see orders) DVT prophylaxis: prophylactic heparin  GI prophylaxis: N/A Lines: N/A Foley:  N/A Code Status:  full code Last date of multidisciplinary goals of care discussion []   Labs   CBC: Recent Labs  Lab 05/15/21 2110 05/17/21 0153 05/18/21 0208 05/18/21 0319  WBC 6.2 17.8* 15.5* 14.3*  NEUTROABS 3.8  --  13.0*  --  HGB 13.5 12.6 15.5* 15.7*  HCT 39.6 38.1 47.1* 46.5*  MCV 87.8 89.6 90.4 89.3  PLT 287 303 341 160    Basic Metabolic Panel: Recent Labs  Lab 05/15/21 2110 05/16/21 0436 05/16/21 0948 05/17/21 0153 05/18/21 0208  NA 135  --  136 131* 135  K 3.6 2.5* 3.8  4.8 4.3  CL 99  --  101 100 99  CO2 28  --  19* 22 24  GLUCOSE 98  --  219* 144* 108*  BUN 8  --  9 22* 21*  CREATININE 1.04*  --  1.25* 1.10* 1.14*  CALCIUM 9.4  --  10.1 9.4 10.2  MG  --   --  2.3  --   --    GFR: Estimated Creatinine Clearance: 56.5 mL/min (A) (by C-G formula based on SCr of 1.14 mg/dL (H)). Recent Labs  Lab 05/15/21 2110 05/17/21 0153 05/18/21 0208 05/18/21 0319  PROCALCITON  --  41.44 0.11  --   WBC 6.2 17.8* 15.5* 14.3*  LATICACIDVEN  --   --   --  2.8*    Liver Function Tests: No results for input(s): AST, ALT, ALKPHOS, BILITOT, PROT, ALBUMIN in the last 168 hours. No results for input(s): LIPASE, AMYLASE in the last 168 hours. No results for input(s): AMMONIA in the last 168 hours.  ABG    Component Value Date/Time   PHART 7.414 05/18/2021 0005   PCO2ART 38.1 05/18/2021 0005   PO2ART 55.7 (L) 05/18/2021 0005   HCO3 23.9 05/18/2021 0005   ACIDBASEDEF 0.1 05/18/2021 0005   O2SAT 89.2 05/18/2021 0005     Coagulation Profile: No results for input(s): INR, PROTIME in the last 168 hours.  Cardiac Enzymes: No results for input(s): CKTOTAL, CKMB, CKMBINDEX, TROPONINI in the last 168 hours.  HbA1C: Hgb A1c MFr Bld  Date/Time Value Ref Range Status  08/22/2015 01:25 AM 6.3 (H) 4.8 - 5.6 % Final    Comment:    (NOTE)         Pre-diabetes: 5.7 - 6.4         Diabetes: >6.4         Glycemic control for adults with diabetes: <7.0     CBG: Recent Labs  Lab 05/18/21 0345 05/18/21 0712  GLUCAP 121* 136*    Review of Systems:   Unable to assess fully, only c/o of some shortness of breath.   Past Medical History:  She,  has a past medical history of Anxiety, Arthritis, Chronic diastolic CHF (congestive heart failure), NYHA class 2 ([place]), COPD (chronic obstructive pulmonary disease) ([place]), Depression, HTN (hypertension), Hypercholesterolemia, Hypothyroidism (acquired), Kidney stones (2015), Myocardial infarction ([place]), Thyrotoxicosis, and TIA  (transient ischemic attack).   Surgical History:   Past Surgical History:  Procedure Laterality Date   BREAST CYST EXCISION     CARDIAC CATHETERIZATION N/A 08/24/2015   Procedure: Left Heart Cath and Coronary Angiography;  Surgeon: Jettie Booze, MD;  Location: [facility];  Service: Cardiovascular;  Laterality: N/A;   COLONOSCOPY     CORONARY STENT PLACEMENT  1999   and then had procedure to repair in 2016   KNEE SURGERY     partial hysterec     THYROIDECTOMY       Social History:   reports that she has been smoking cigarettes. She has been smoking an average of .5 packs per day. She has never used smokeless tobacco. She reports that she does not drink alcohol and does not use drugs.  Family History:  Her family history includes Cancer in her brother and father; Colon cancer in her father; Diabetes in her maternal grandmother; Gout in her brother and brother; Berenice Primas' disease in her father; Heart disease in her father and mother; Hypertension in her maternal grandmother and mother; Stroke in her brother and mother. There is no history of Heart attack, Esophageal cancer, Stomach cancer, or Rectal cancer.   Allergies Allergies  Allergen Reactions   Hydrocodone Nausea Only    Has problems with high doses     Home Medications  Prior to Admission medications   Medication Sig Start Date End Date Taking? Authorizing Provider  albuterol (PROVENTIL HFA;VENTOLIN HFA) 108 (90 Base) MCG/ACT inhaler Inhale 2 puffs into the lungs every 4 (four) hours as needed for shortness of breath.  06/21/15 05/16/21 Yes [provider]  ALPRAZolam [name]) 1 MG tablet Take 1 tablet by mouth 3 (three) times daily as needed for anxiety. 05/10/20  Yes [provider]  aspirin EC 81 MG tablet Take 1 tablet (81 mg total) by mouth daily. Swallow whole. 09/02/20  Yes Jettie Booze, MD  cyclobenzaprine (FLEXERIL) 10 MG tablet Take 1 tablet (10 mg total) by mouth 3 (three) times  daily as needed for muscle spasms. 03/29/17  Yes [name], NP  docusate sodium (COLACE) 100 MG capsule Take 100 mg by mouth 2 (two) times daily as needed for constipation. 12/19/19  Yes [provider]  furosemide (LASIX) 40 MG tablet Take 40 mg by mouth daily.   Yes [provider]  gabapentin (NEURONTIN) 300 MG capsule Take 300 mg by mouth daily as needed (For pain). 03/11/20  Yes [provider]  HYDROcodone-acetaminophen (NORCO/VICODIN) 5-325 MG tablet Take 1-2 tablets by mouth every 6 (six) hours as needed for moderate pain. 05/21/20  Yes Shelly Coss, MD  isosorbide mononitrate (IMDUR) 30 MG 24 hr tablet Take 2 tablets (60 mg total) by mouth daily. 07/17/16  Yes [name], MD  levothyroxine (SYNTHROID) 112 MCG tablet TAKE 1 TABLET BY MOUTH EVERY MORNING 12/06/20  Yes [name], MD  LINZESS 145 MCG CAPS capsule Take 145 mcg by mouth daily. 04/30/20  Yes [provider]  nitroGLYCERIN (NITROSTAT) 0.3 MG SL tablet Place 0.3 mg under the tongue every 5 (five) minutes as needed for chest pain. 10/28/15  Yes [provider]  triamcinolone cream (KENALOG) 0.5 % Apply 1 application topically daily as needed (for sun exposure).   Yes [provider]  UNABLE TO FIND Take 2 tablets by mouth daily. Med Name: Garlic and Honey (homemade regimen) - two tablets a day with tsp of honey per patient   Yes [provider]  metoprolol succinate (TOPROL-XL) 25 MG 24 hr tablet Take 0.5 tablets (12.5 mg total) by mouth daily. Patient not taking: Reported on 05/16/2021 05/21/20   Shelly Coss, MD  rosuvastatin (CRESTOR) 20 MG tablet Take 1 tablet (20 mg total) by mouth daily. Patient not taking: Reported on 05/16/2021 07/20/20   Jettie Booze, MD     Critical care time: 40 min    The patient evaluated for acute respiratory failure and high risk for deterioration frequent evaluation and titration of therapies, application of advanced  monitoring technologies and extensive interpretation of multiple databases.    Rodman Pickle, M.D. [facility] Pulmonary/Critical Care Medicine 05/18/2021 10:15 AM   Please see Amion for pager number to reach on-call Pulmonary and Critical Care Team.

## 2021-05-18 NOTE — Progress Notes (Addendum)
Pharmacy Antibiotic Note  Jocelyn [name] is a 60 y.o. female admitted on 05/15/2021 with pneumonia.  Pharmacy has been consulted for Vancomycin and Zosyn dosing.  Addendum (0330): CCM MD changing Zosyn to Cefepime  Plan: Cefepime 2gm IV q12h Vancomycin  1750 mg IV now then 1000 mg IV Q 24 hrs. Goal AUC 400-550.Expected AUC: 512, SCr used: 1.1, Vd coeff 0.5 Will f/u renal function, micro data, and pt's clinical condition Vanc levels prn   Height: 5\' 4"  (162.6 cm) Weight: 88.5 kg (195 lb) IBW/kg (Calculated) : 54.7  Temp (24hrs), Avg:98.1 F (36.7 C), Min:97.2 F (36.2 C), Max:98.6 F (37 C)  Recent Labs  Lab 05/15/21 2110 05/16/21 0948 05/17/21 0153  WBC 6.2  --  17.8*  CREATININE 1.04* 1.25* 1.10*    Estimated Creatinine Clearance: 59.3 mL/min (A) (by C-G formula based on SCr of 1.1 mg/dL (H)).    Allergies  Allergen Reactions   Hydrocodone Nausea Only    Has problems with high doses    Antimicrobials this admission: 10/17 Rocephin x 1 10/17 Doxy >> 10/18 10/18 Vanc >>  10/18 Cefepime >>   Microbiology results:  BCx:   Thank you for allowing pharmacy to be a part of this patient's care.  Sherlon Handing, PharmD, BCPS Please see amion for complete clinical pharmacist phone list 05/18/2021 2:55 AM

## 2021-05-18 NOTE — Progress Notes (Signed)
Patient complains of increased shortness of breath and continuous coughing.  Patient denies any pain at this time time.  Patient has inspiratory and expiratory wheezes  through out lungs.  Rapid response , respiratory, and  and MD notified.  Will continued to monitor.  HR-115, Resp.-40, BP 118/72, Temp- 99.3.     Donah Driver, RN

## 2021-05-18 NOTE — H&P (Signed)
NAME:  Jocelyn [name], MRN:  [id number], DOB:  06-12-1961, LOS: 2 ADMISSION DATE:  05/15/2021, CONSULTATION DATE:  05/18/21 REFERRING MD:  Oneal Grout, CHIEF COMPLAINT: tachycardia, tachypnea.   History of Present Illness:  Mrs. 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  Productive cough with clear sputum.  + sick contacts.   Increased wheezing.  No hx of tx for copd, had albuterol at home but never had used it.   Found to be tachycardic and hypoxemic, started 5L [place] Was given solumedrol and magnesium.   Lasix was held due to AKI.  CTA done - no PE  I/o negative 1280 since admission  This evening has developed intermittent tachypnea, wheezing and tachycardia.  Now has developed a fever.  Increasing oxygen needs.   Pertinent  Medical History  Hypothyroidism HTN HLD CAD s/p stents  CHF  COPD Current smoker 1 ppd  CKD Hx of TIA Hx of LV thrombus, renal and splenic infarcts 2021  Home meds: xanax prn, asa, flexeril, colace, estradiol, lasix, gabapentin, norco, imdur 30mg  daily, synthroid 171mcg daily, losartan, metoprolol, nitroglycerin, rosuvastatin Significant Hospital Events: Including procedures, antibiotic start and stop dates in addition to other pertinent events     Interim History / Subjective:   Objective   Blood pressure 133/84, pulse (!) 113, temperature 98.3 F (36.8 C), temperature source Oral, resp. rate 16, height 5\' 4"  (1.626 m), weight 88.5 kg, SpO2 91 %.        Intake/Output Summary (Last 24 hours) at 05/18/2021 0327 Last data filed at 05/17/2021 1700 Gross per 24 hour  Intake 340 ml  Output 500 ml  Net -160 ml   Filed Weights   05/15/21 1926  Weight: 88.5 kg    Examination: General: appears slightly uncomfortable.  Somewhat tachypneic HENT: ncat  Lungs: Reduced wheeze Cardiovascular: RRR no mgr  Abdomen: nt, nd,  nbs Extremities: No edema Neuro: awake alert   CXR: clear  Resolved Hospital Problem list     Assessment & Plan:  Tachypnea, wheeze Fever, leukocytosis, elevated procalcitonin: I am concerned about an underlying infectious PNA, also has concurrent persistent COPD exacerbation.   Current status is likely related to febrile state.  Receiving tylenol now, reassess once fever improved.   Cont Duonebs.  Increased steroids back to methylpred. Broadened antibiotics to cefepime and vanc.  BNP 79,  Lactate 2.8 Procal now only 0.11 Repeating Flu and Covid test.  Urine strep and legionella antigen pending.  Blood cultures P   AKI improving gradually    Best Practice (right click and "Reselect all SmartList Selections" daily)   Diet/type: NPO DVT prophylaxis: prophylactic heparin  GI prophylaxis: N/A Lines: N/A Foley:  N/A Code Status:  full code Last date of multidisciplinary goals of care discussion []   Labs   CBC: Recent Labs  Lab 05/15/21 2110 05/17/21 0153 05/18/21 0208  WBC 6.2 17.8* 15.5*  NEUTROABS 3.8  --  13.0*  HGB 13.5 12.6 15.5*  HCT 39.6 38.1 47.1*  MCV 87.8 89.6 90.4  PLT 287 303 329    Basic Metabolic Panel: Recent Labs  Lab 05/15/21 2110 05/16/21 0436 05/16/21 0948 05/17/21 0153  NA 135  --  136 131*  K 3.6 2.5* 3.8 4.8  CL 99  --  101 100  CO2 28  --  19* 22  GLUCOSE 98  --  219* 144*  BUN 8  --  9 22*  CREATININE 1.04*  --  1.25* 1.10*  CALCIUM 9.4  --  10.1 9.4  MG  --   --  2.3  --    GFR: Estimated Creatinine Clearance: 59.3 mL/min (A) (by C-G formula based on SCr of 1.1 mg/dL (H)). Recent Labs  Lab 05/15/21 2110 05/17/21 0153 05/18/21 0208  PROCALCITON  --  41.44  --   WBC 6.2 17.8* 15.5*    Liver Function Tests: No results for input(s): AST, ALT, ALKPHOS, BILITOT, PROT, ALBUMIN in the last 168 hours. No results for input(s): LIPASE, AMYLASE in the last 168 hours. No results for input(s): AMMONIA in the last 168  hours.  ABG    Component Value Date/Time   PHART 7.414 05/18/2021 0005   PCO2ART 38.1 05/18/2021 0005   PO2ART 55.7 (L) 05/18/2021 0005   HCO3 23.9 05/18/2021 0005   ACIDBASEDEF 0.1 05/18/2021 0005   O2SAT 89.2 05/18/2021 0005     Coagulation Profile: No results for input(s): INR, PROTIME in the last 168 hours.  Cardiac Enzymes: No results for input(s): CKTOTAL, CKMB, CKMBINDEX, TROPONINI in the last 168 hours.  HbA1C: Hgb A1c MFr Bld  Date/Time Value Ref Range Status  08/22/2015 01:25 AM 6.3 (H) 4.8 - 5.6 % Final    Comment:    (NOTE)         Pre-diabetes: 5.7 - 6.4         Diabetes: >6.4         Glycemic control for adults with diabetes: <7.0     CBG: No results for input(s): GLUCAP in the last 168 hours.  Review of Systems:   Unable to assess fully, only c/o of some shortness of breath.   Past Medical History:  She,  has a past medical history of Anxiety, Arthritis, Chronic diastolic CHF (congestive heart failure), NYHA class 2 ([place]), COPD (chronic obstructive pulmonary disease) ([place]), Depression, HTN (hypertension), Hypercholesterolemia, Hypothyroidism (acquired), Kidney stones (2015), Myocardial infarction ([place]), Thyrotoxicosis, and TIA (transient ischemic attack).   Surgical History:   Past Surgical History:  Procedure Laterality Date   BREAST CYST EXCISION     CARDIAC CATHETERIZATION N/A 08/24/2015   Procedure: Left Heart Cath and Coronary Angiography;  Surgeon: Jettie Booze, MD;  Location: [facility];  Service: Cardiovascular;  Laterality: N/A;   COLONOSCOPY     CORONARY STENT PLACEMENT  1999   and then had procedure to repair in 2016   KNEE SURGERY     partial hysterec     THYROIDECTOMY       Social History:   reports that she has been smoking cigarettes. She has been smoking an average of .5 packs per day. She has never used smokeless tobacco. She reports that she does not drink alcohol and does not use drugs.   Family History:  Her  family history includes Cancer in her brother and father; Colon cancer in her father; Diabetes in her maternal grandmother; Gout in her brother and brother; Berenice Primas' disease in her father; Heart disease in her father and mother; Hypertension in her maternal grandmother and mother; Stroke in her brother and mother. There is no history of Heart attack, Esophageal cancer, Stomach cancer, or Rectal cancer.   Allergies Allergies  Allergen Reactions   Hydrocodone Nausea Only    Has problems with high doses     Home Medications  Prior to Admission medications   Medication Sig Start Date End Date Taking?  Authorizing Provider  albuterol (PROVENTIL HFA;VENTOLIN HFA) 108 (90 Base) MCG/ACT inhaler Inhale 2 puffs into the lungs every 4 (four) hours as needed for shortness of breath.  06/21/15 05/16/21 Yes [provider]  ALPRAZolam [name]) 1 MG tablet Take 1 tablet by mouth 3 (three) times daily as needed for anxiety. 05/10/20  Yes [provider]  aspirin EC 81 MG tablet Take 1 tablet (81 mg total) by mouth daily. Swallow whole. 09/02/20  Yes Jettie Booze, MD  cyclobenzaprine (FLEXERIL) 10 MG tablet Take 1 tablet (10 mg total) by mouth 3 (three) times daily as needed for muscle spasms. 03/29/17  Yes [name], NP  docusate sodium (COLACE) 100 MG capsule Take 100 mg by mouth 2 (two) times daily as needed for constipation. 12/19/19  Yes [provider]  furosemide (LASIX) 40 MG tablet Take 40 mg by mouth daily.   Yes [provider]  gabapentin (NEURONTIN) 300 MG capsule Take 300 mg by mouth daily as needed (For pain). 03/11/20  Yes [provider]  HYDROcodone-acetaminophen (NORCO/VICODIN) 5-325 MG tablet Take 1-2 tablets by mouth every 6 (six) hours as needed for moderate pain. 05/21/20  Yes Shelly Coss, MD  isosorbide mononitrate (IMDUR) 30 MG 24 hr tablet Take 2 tablets (60 mg total) by mouth daily. 07/17/16  Yes [name], MD  levothyroxine  (SYNTHROID) 112 MCG tablet TAKE 1 TABLET BY MOUTH EVERY MORNING 12/06/20  Yes [name], MD  LINZESS 145 MCG CAPS capsule Take 145 mcg by mouth daily. 04/30/20  Yes [provider]  nitroGLYCERIN (NITROSTAT) 0.3 MG SL tablet Place 0.3 mg under the tongue every 5 (five) minutes as needed for chest pain. 10/28/15  Yes [provider]  triamcinolone cream (KENALOG) 0.5 % Apply 1 application topically daily as needed (for sun exposure).   Yes [provider]  UNABLE TO FIND Take 2 tablets by mouth daily. Med Name: Garlic and Honey (homemade regimen) - two tablets a day with tsp of honey per patient   Yes [provider]  metoprolol succinate (TOPROL-XL) 25 MG 24 hr tablet Take 0.5 tablets (12.5 mg total) by mouth daily. Patient not taking: Reported on 05/16/2021 05/21/20   Shelly Coss, MD  rosuvastatin (CRESTOR) 20 MG tablet Take 1 tablet (20 mg total) by mouth daily. Patient not taking: Reported on 05/16/2021 07/20/20   Jettie Booze, MD     Critical care time: 60 min

## 2021-05-18 NOTE — Progress Notes (Signed)
Overnight event  Notified by RN that patient was given DuoNeb and Solu-Medrol, however, still having audible wheezing.  Also now febrile with temperature 103 F and having rigors.  Tachycardic with heart rate in the 130s, tachypneic with respiratory rate in the 40s.  SPO2 92% on 4 L supplemental oxygen.  Blood pressure 142/79.    She is COVID and flu negative.  CT done 10/16 showing findings consistent with multifocal pneumonia.  Chest x-ray done today showing no acute abnormality.  -Change antibiotics to vancomycin and Zosyn -Check lactic acid level.  Repeat CBC and procalcitonin. -Blood culture x2 -Sputum gram stain and culture -Legionella and strep pneumo urinary antigen -Keep n.p.o., aspiration precautions -Discussed with PCCM, patient will be transferred to the ICU.

## 2021-05-18 NOTE — Progress Notes (Signed)
Asked to evaluate patient for opinion on her respiratory status.  I am told that earlier this evening she had a coughing fit, followed by tachypnea (30s-40s) She was given albuterol nebs, xopenex, with some improvement.   Lasix also given.  CXR P.  Abg confirms low oxygen (pao2 55).  Ok Co2.   Ordered duonebs q 4, switched back to to methylpred BID from prednisone.   Ok to remain at current status, but please call if status changes again.

## 2021-05-18 NOTE — Progress Notes (Signed)
Patient transferred from [place]. Upon arrival, patient was on 5 Lpm [place] with sats 86%. Placed patient on HFNC increasing O2 needs until goal of 92% SATs were obtained, resulting at 12 Lpm HFNC. Patient with bilateral expiratory wheezes and RR 40. Patient getting settled into to new bed. Will continue to monitor.

## 2021-05-18 NOTE — Progress Notes (Signed)
Patient transferred to 2M08.  Reported called to [place], RN. Notified patient's daughter Malashia Kamaka.  Transported  via stretcher by ICU nurse Orvil Feil, and myself.     Donah Driver, RN

## 2021-05-18 NOTE — Progress Notes (Signed)
[facility] Progress Note Patient Name: Jocelyn [name] DOB: 1960-08-15 MRN: [id number]   Date of Service  05/18/2021  HPI/Events of Note  Briefly, 59/F with hx of COPD, transferred from [place] due to respiratory distress and increasing O2 requirements.  Pt was being treated on the floors for CAP with ceftriaxone and doxycycline.  She was also on steroids for COPD exacerbation.   Pt is awake and alert, slightly tachypneic.   BP 117/94, HR 122, RR 22, O2 sats 93%.   CT chest No evidence of pulmonary emboli. Patchy ground-glass opacities consistent with a postinflammatory nature predominately within the upper lobes. No other focal abnormality is seen.  eICU Interventions  Acute respiratory failure with hypoxia COPD exacerbation Pneumonia Active smoker AKI CHF Hypertension  Agree with broadening antibiotic coverage.  Steroids switched back to IV methylprednisolone 80mg  IV BID. Continue with neb treatments around the clock. Monitor I&Os - pt given lasix earlier.  Titrate O2 to keep sats >92%. Lovenox for DVT prophylaxis.     Intervention Category Evaluation Type: New Patient Evaluation  [name] 05/18/2021, 4:01 AM

## 2021-05-18 NOTE — Congregational Nurse Program (Signed)
Patient woke up short of breath and coughing.  Patient is shaking and states that she is very cold at this time.  Temp is 103 oral, R-44, BP 142/79, HR, 128, and 88%/5L.  Patient does not complain of any pain at this time.  Patient has inspiratory and expiratory wheezes through out lungs.  Rapid response , respiratory, and  and MD notified.  Will continued to monitor.    Donah Driver, RN

## 2021-05-19 ENCOUNTER — Other Ambulatory Visit (HOSPITAL_COMMUNITY): Payer: Medicare Other

## 2021-05-19 ENCOUNTER — Inpatient Hospital Stay (HOSPITAL_COMMUNITY): Payer: Medicare Other

## 2021-05-19 DIAGNOSIS — R0609 Other forms of dyspnea: Secondary | ICD-10-CM | POA: Diagnosis not present

## 2021-05-19 LAB — CBC WITH DIFFERENTIAL/PLATELET
Abs Immature Granulocytes: 0.06 10*3/uL (ref 0.00–0.07)
Basophils Absolute: 0 10*3/uL (ref 0.0–0.1)
Basophils Relative: 0 %
Eosinophils Absolute: 0 10*3/uL (ref 0.0–0.5)
Eosinophils Relative: 0 %
HCT: 41 % (ref 36.0–46.0)
Hemoglobin: 13.7 g/dL (ref 12.0–15.0)
Immature Granulocytes: 1 %
Lymphocytes Relative: 12 %
Lymphs Abs: 1.1 10*3/uL (ref 0.7–4.0)
MCH: 29.8 pg (ref 26.0–34.0)
MCHC: 33.4 g/dL (ref 30.0–36.0)
MCV: 89.3 fL (ref 80.0–100.0)
Monocytes Absolute: 1 10*3/uL (ref 0.1–1.0)
Monocytes Relative: 11 %
Neutro Abs: 7.1 10*3/uL (ref 1.7–7.7)
Neutrophils Relative %: 76 %
Platelets: 273 10*3/uL (ref 150–400)
RBC: 4.59 MIL/uL (ref 3.87–5.11)
RDW: 14.2 % (ref 11.5–15.5)
WBC: 9.4 10*3/uL (ref 4.0–10.5)
nRBC: 0 % (ref 0.0–0.2)

## 2021-05-19 LAB — PROCALCITONIN: Procalcitonin: 0.17 ng/mL

## 2021-05-19 LAB — ECHOCARDIOGRAM COMPLETE
Area-P 1/2: 3 cm2
Height: 64 in
S' Lateral: 3.4 cm
Weight: 3037.06 oz

## 2021-05-19 LAB — BASIC METABOLIC PANEL
Anion gap: 11 (ref 5–15)
BUN: 22 mg/dL — ABNORMAL HIGH (ref 6–20)
CO2: 24 mmol/L (ref 22–32)
Calcium: 9.8 mg/dL (ref 8.9–10.3)
Chloride: 101 mmol/L (ref 98–111)
Creatinine, Ser: 1.01 mg/dL — ABNORMAL HIGH (ref 0.44–1.00)
GFR, Estimated: >60 mL/min (ref >60)
Glucose, Bld: 109 mg/dL — ABNORMAL HIGH (ref 70–99)
Potassium: 4.4 mmol/L (ref 3.5–5.1)
Sodium: 136 mmol/L (ref 135–145)

## 2021-05-19 LAB — GLUCOSE, CAPILLARY: Glucose-Capillary: 139 mg/dL — ABNORMAL HIGH (ref 70–99)

## 2021-05-19 MED ORDER — PERFLUTREN LIPID MICROSPHERE
1.0000 mL | INTRAVENOUS | Status: AC | PRN
  Administered 2021-05-19: 6 mL via INTRAVENOUS
  Filled 2021-05-19: qty 10

## 2021-05-19 NOTE — Progress Notes (Signed)
RT NOTE:  Pt started on BIPAP for mild OSA per MD order. Pt states she has never had a sleep study or worn a BIPAP at bedtime. Pt was started on auto titration via nasal mask. Pt stated that she will try to wear Bipap, however, doesn't know if she can tolerate it. RN is aware and will contact RT if patient decides to take it off.

## 2021-05-19 NOTE — Progress Notes (Signed)
Echocardiogram 2D Echocardiogram has been performed.  Oneal Deputy Alaijah Gibler RDCS 05/19/2021, 1:15 PM

## 2021-05-19 NOTE — Plan of Care (Signed)
  Problem: Education: Goal: Knowledge of General Education information will improve Description Including pain rating scale, medication(s)/side effects and non-pharmacologic comfort measures Outcome: Progressing   Problem: Health Behavior/Discharge Planning: Goal: Ability to manage health-related needs will improve Outcome: Progressing   

## 2021-05-19 NOTE — Care Management Important Message (Signed)
Important Message  Patient Details  Name: Jocelyn [name] MRN: [id number] Date of Birth: 1961-03-02   Medicare Important Message Given:  Yes     Spyridon Hornstein 05/19/2021, 3:20 PM

## 2021-05-19 NOTE — Progress Notes (Signed)
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  Productive cough with clear sputum.  + sick contacts.   Increased wheezing.  No hx of tx for copd, had albuterol at home but never had used it.   Found to be tachycardic and hypoxemic, started 5L [place] Was given solumedrol and magnesium.   Lasix was held due to AKI.  CTA done - no PE Patient was admitted to ICU, she was transferred to aggressive unit under [facility] 10/19 on telemetry floor..   Assessment & Plan:   Principal Problem:   Acute on chronic respiratory failure with hypoxia (HCC) Active Problems:   Hypertension   Tobacco abuse   Coronary artery disease   COPD exacerbation (HCC)   Hypokalemia   AKI (acute kidney injury) ([place])  Acute hypoxemic respiratory failure COPD exacerbation secondary to RSV - CTA with bilateral upper lobe ground glass opacities, this is most likely of viral  --De-escalate to ceftriaxone and doxycycline for five days total --F/u cultures, strep pneumo and legionella studies --Wean supplemental oxygen for goal >90% --Continue IV steroids --Scheduled duonebs.  - Recommend starting Incruse prior to discharge -Was encouraged use incentive spirometry, flutter valve, I have discussed with her to get out of bed to chair, will consult PT, to see with her oxygen requirement with activity.   Mild OSA --NIV nightly    AKI --Monitor UOP/Cr   Chronic combined heart failure HTN -S/p lasix -Hold diuretics for now due to euvolemia -Continue imdur and toprol - follow on repeat echo.   CAD, s/p stents Hx LV thrombus - resolved on 08/2020 echocardiogram --No indication for  systemic anticoagulation   Anxiety --PRN xanax --Restart home wellbutrin   Hypothyroidism --Continue synthroid   DVT prophylaxis: Lovenox Code Status: Full Family Communication:  daughters at bedside Disposition:   Status is: Inpatient  Remains inpatient appropriate because: IV treatment, needs oxygen      Consultants:  - PCCM   Subjective:  She reports some cough, and reports Dyspnea has improved.  Objective: Vitals:   05/19/21 0400 05/19/21 0814 05/19/21 0854 05/19/21 1250  BP: 121/77  123/71   Pulse: 82  92   Resp: (!) 28  16   Temp:   98.6 F (37 C)   TempSrc:   Oral   SpO2: 95% 95% 90% 96%  Weight:      Height:        Intake/Output Summary (Last 24 hours) at 05/19/2021 1319 Last data filed at 05/19/2021 1015 Gross per 24 hour  Intake 340 ml  Output 850 ml  Net -510 ml   Filed Weights   05/15/21 1926 05/18/21 0342  Weight: 88.5 kg 86.1 kg    Examination:  Awake Alert, Oriented X 3, No new F.N deficits, Normal affect Symmetrical Chest wall movement, diminished air entry with scattered wheezing RRR,No Gallops,Rubs or new Murmurs, No Parasternal Heave +ve B.Sounds, Abd Soft, No tenderness, No rebound - guarding or rigidity. No Cyanosis, Clubbing or edema, No new Rash or bruise      Data Reviewed: I have personally reviewed following labs and imaging studies  CBC: Recent Labs  Lab 05/15/21 2110 05/17/21 0153 05/18/21 0208 05/18/21 0319 05/19/21 0126  WBC 6.2 17.8* 15.5* 14.3* 9.4  NEUTROABS 3.8  --  13.0*  --  7.1  HGB 13.5 12.6 15.5* 15.7* 13.7  HCT 39.6 38.1 47.1* 46.5* 41.0  MCV 87.8 89.6 90.4 89.3 89.3  PLT 287 303 341 292 315    Basic Metabolic Panel: Recent Labs  Lab 05/15/21 2110 05/16/21 0436 05/16/21 0948 05/17/21 0153 05/18/21 0208 05/19/21 0126  NA 135  --  136 131* 135 136  K 3.6 2.5* 3.8 4.8 4.3 4.4  CL 99  --  101 100 99 101  CO2 28  --  19* 22 24 24   GLUCOSE 98  --  219* 144* 108* 109*  BUN 8  --  9 22*  21* 22*  CREATININE 1.04*  --  1.25* 1.10* 1.14* 1.01*  CALCIUM 9.4  --  10.1 9.4 10.2 9.8  MG  --   --  2.3  --   --   --     GFR: Estimated Creatinine Clearance: 63.7 mL/min (A) (by C-G formula based on SCr of 1.01 mg/dL (H)).  Liver Function Tests: No results for input(s): AST, ALT, ALKPHOS, BILITOT, PROT, ALBUMIN in the last 168 hours.  CBG: Recent Labs  Lab 05/18/21 0345 05/18/21 0712  GLUCAP 121* 136*     Recent Results (from the past 240 hour(s))  Resp Panel by RT-PCR (Flu A&B, Covid) Nasopharyngeal Swab     Status: None   Collection Time: 05/15/21  9:11 PM   Specimen: Nasopharyngeal Swab; Nasopharyngeal(NP) swabs in vial transport medium  Result Value Ref Range Status   SARS Coronavirus 2 by RT PCR NEGATIVE NEGATIVE Final    Comment: (NOTE) SARS-CoV-2 target nucleic acids are NOT DETECTED.  The SARS-CoV-2 RNA is generally detectable in upper respiratory specimens during the acute phase of infection. The lowest concentration of SARS-CoV-2 viral copies this assay can detect is 138 copies/mL. A negative result does not preclude SARS-Cov-2 infection and should not be used as the sole basis for treatment or other patient management decisions. A negative result may occur with  improper specimen collection/handling, submission of specimen other than nasopharyngeal swab, presence of viral mutation(s) within the areas targeted by this assay, and inadequate number of viral copies(<138 copies/mL). A negative result must be combined with clinical observations, patient history, and epidemiological information. The expected result is Negative.  Fact Sheet for Patients:  EntrepreneurPulse.com.au  Fact Sheet for Healthcare Providers:  IncredibleEmployment.be  This test is no t yet approved or cleared by the [place] FDA and  has been authorized for detection and/or diagnosis of SARS-CoV-2 by FDA under an Emergency Use Authorization  (EUA). This EUA will remain  in effect (meaning this test can be used) for the duration of the COVID-19 declaration under Section 564(b)(1) of the Act, 21 U.S.C.section 360bbb-3(b)(1), unless the authorization is terminated  or revoked sooner.       Influenza A by PCR NEGATIVE NEGATIVE Final   Influenza B by PCR NEGATIVE NEGATIVE Final    Comment: (NOTE) The Xpert Xpress SARS-CoV-2/FLU/RSV plus assay is intended as an aid in the diagnosis of influenza from Nasopharyngeal swab specimens and should not be used as a sole basis for treatment. Nasal washings and aspirates are unacceptable for Xpert Xpress SARS-CoV-2/FLU/RSV testing.  Fact Sheet for Patients: EntrepreneurPulse.com.au  Fact Sheet for Healthcare Providers: IncredibleEmployment.be  This test is not yet approved or cleared by the [place]  FDA and has been authorized for detection and/or diagnosis of SARS-CoV-2 by FDA under an Emergency Use Authorization (EUA). This EUA will remain in effect (meaning this test can be used) for the duration of the COVID-19 declaration under Section 564(b)(1) of the Act, 21 U.S.C. section 360bbb-3(b)(1), unless the authorization is terminated or revoked.  Performed at [facility], [address] [place], [place] [address]   Respiratory (~20 pathogens) panel by PCR     Status: Abnormal   Collection Time: 05/18/21  2:46 AM   Specimen: Nasopharyngeal Swab; Respiratory  Result Value Ref Range Status   Adenovirus NOT DETECTED NOT DETECTED Final   Coronavirus 229E NOT DETECTED NOT DETECTED Final    Comment: (NOTE) The Coronavirus on the Respiratory Panel, DOES NOT test for the novel  Coronavirus (2019 nCoV)    Coronavirus HKU1 NOT DETECTED NOT DETECTED Final   Coronavirus NL63 NOT DETECTED NOT DETECTED Final   Coronavirus OC43 NOT DETECTED NOT DETECTED Final   Metapneumovirus NOT DETECTED NOT DETECTED Final   Rhinovirus / Enterovirus  NOT DETECTED NOT DETECTED Final   Influenza A NOT DETECTED NOT DETECTED Final   Influenza B NOT DETECTED NOT DETECTED Final   Parainfluenza Virus 1 NOT DETECTED NOT DETECTED Final   Parainfluenza Virus 2 NOT DETECTED NOT DETECTED Final   Parainfluenza Virus 3 NOT DETECTED NOT DETECTED Final   Parainfluenza Virus 4 NOT DETECTED NOT DETECTED Final   Respiratory Syncytial Virus DETECTED (A) NOT DETECTED Final   Bordetella pertussis NOT DETECTED NOT DETECTED Final   Bordetella Parapertussis NOT DETECTED NOT DETECTED Final   Chlamydophila pneumoniae NOT DETECTED NOT DETECTED Final   Mycoplasma pneumoniae NOT DETECTED NOT DETECTED Final    Comment: Performed at [facility], [place] [address], [place], [place] [address]  Culture, blood (routine x 2)     Status: None (Preliminary result)   Collection Time: 05/18/21  2:56 AM   Specimen: BLOOD  Result Value Ref Range Status   Specimen Description BLOOD RIGHT ANTECUBITAL  Final   Special Requests   Final    BOTTLES DRAWN AEROBIC AND ANAEROBIC Blood Culture results may not be optimal due to an inadequate volume of blood received in culture bottles   Culture   Final    NO GROWTH 1 DAY Performed at [facility], [place] [address], [place], [place] [address]    Report Status PENDING  Incomplete  Culture, blood (routine x 2)     Status: None (Preliminary result)   Collection Time: 05/18/21  3:21 AM   Specimen: BLOOD  Result Value Ref Range Status   Specimen Description BLOOD LEFT ANTECUBITAL  Final   Special Requests   Final    BOTTLES DRAWN AEROBIC ONLY Blood Culture results may not be optimal due to an inadequate volume of blood received in culture bottles   Culture   Final    NO GROWTH 1 DAY Performed at [facility], [place] [address] [place], [place] [address]    Report Status PENDING  Incomplete  MRSA Next Gen by PCR, Nasal     Status: None   Collection Time: 05/18/21  3:40 AM  Result Value Ref Range Status   MRSA by PCR Next  Gen NOT DETECTED NOT DETECTED Final    Comment: (NOTE) The GeneXpert MRSA Assay (FDA approved for NASAL specimens only), is one component of a comprehensive MRSA colonization surveillance program. It is not intended to diagnose MRSA infection nor to  guide or monitor treatment for MRSA infections. Test performance is not FDA approved in patients less than 38 years old. Performed at [facility], [place] [address], [place], [place] [address]          Radiology Studies: DG CHEST PORT 1 VIEW  Result Date: 05/18/2021 CLINICAL DATA:  Respiratory distress EXAM: PORTABLE CHEST 1 VIEW COMPARISON:  05/16/2021 CT FINDINGS: Cardiac shadow is within normal limits. The lungs are well aerated bilaterally. The changes seen on recent CT are not well appreciated on plain film examination. No sizable effusion is seen. No bony abnormality is noted. IMPRESSION: No acute abnormality noted. The findings on recent chest CT are not well appreciated on this study. Electronically Signed   By: Inez Catalina M.D.   On: 05/18/2021 00:17        Scheduled Meds:  aspirin EC  81 mg Oral Daily   buPROPion  300 mg Oral Daily   Chlorhexidine Gluconate Cloth  6 each Topical Daily   enoxaparin (LOVENOX) injection  40 mg Subcutaneous Q24H   feeding supplement  237 mL Oral BID BM   guaiFENesin  600 mg Oral BID   ipratropium-albuterol  3 mL Nebulization Q4H   isosorbide mononitrate  60 mg Oral Daily   levothyroxine  112 mcg Oral Q0600   linaclotide  145 mcg Oral Daily   mouth rinse  15 mL Mouth Rinse BID   methylPREDNISolone (SOLU-MEDROL) injection  80 mg Intravenous Q12H   metoprolol succinate  12.5 mg Oral Daily   multivitamin with minerals  1 tablet Oral Daily   nicotine  21 mg Transdermal Daily   sodium chloride flush  3 mL Intravenous Q12H   Continuous Infusions:  cefTRIAXone (ROCEPHIN)  IV 1 g (05/18/21 1650)     LOS: 3 days      Phillips Climes, MD [facility]   To contact the  attending provider between 7A-7P or the covering provider during after hours 7P-7A, please log into the web site [email or web address] and access using universal [place] password for that web site. If you do not have the password, please call the hospital operator.  05/19/2021, 1:19 PM

## 2021-05-20 LAB — CBC WITH DIFFERENTIAL/PLATELET
Abs Immature Granulocytes: 0.03 10*3/uL (ref 0.00–0.07)
Basophils Absolute: 0 10*3/uL (ref 0.0–0.1)
Basophils Relative: 0 %
Eosinophils Absolute: 0 10*3/uL (ref 0.0–0.5)
Eosinophils Relative: 0 %
HCT: 40.2 % (ref 36.0–46.0)
Hemoglobin: 13.1 g/dL (ref 12.0–15.0)
Immature Granulocytes: 0 %
Lymphocytes Relative: 17 %
Lymphs Abs: 1.2 10*3/uL (ref 0.7–4.0)
MCH: 29.6 pg (ref 26.0–34.0)
MCHC: 32.6 g/dL (ref 30.0–36.0)
MCV: 90.7 fL (ref 80.0–100.0)
Monocytes Absolute: 0.6 10*3/uL (ref 0.1–1.0)
Monocytes Relative: 8 %
Neutro Abs: 5.1 10*3/uL (ref 1.7–7.7)
Neutrophils Relative %: 75 %
Platelets: 262 10*3/uL (ref 150–400)
RBC: 4.43 MIL/uL (ref 3.87–5.11)
RDW: 13.7 % (ref 11.5–15.5)
WBC: 6.9 10*3/uL (ref 4.0–10.5)
nRBC: 0 % (ref 0.0–0.2)

## 2021-05-20 LAB — BASIC METABOLIC PANEL
Anion gap: 10 (ref 5–15)
BUN: 23 mg/dL — ABNORMAL HIGH (ref 6–20)
CO2: 24 mmol/L (ref 22–32)
Calcium: 9.5 mg/dL (ref 8.9–10.3)
Chloride: 100 mmol/L (ref 98–111)
Creatinine, Ser: 1 mg/dL (ref 0.44–1.00)
GFR, Estimated: >60 mL/min (ref >60)
Glucose, Bld: 141 mg/dL — ABNORMAL HIGH (ref 70–99)
Potassium: 5.2 mmol/L — ABNORMAL HIGH (ref 3.5–5.1)
Sodium: 134 mmol/L — ABNORMAL LOW (ref 135–145)

## 2021-05-20 MED ORDER — IPRATROPIUM-ALBUTEROL 0.5-2.5 (3) MG/3ML IN SOLN
3.0000 mL | Freq: Three times a day (TID) | RESPIRATORY_TRACT | Status: DC
  Administered 2021-05-20: 3 mL via RESPIRATORY_TRACT
  Filled 2021-05-20: qty 3

## 2021-05-20 MED ORDER — METHYLPREDNISOLONE SODIUM SUCC 125 MG IJ SOLR
80.0000 mg | Freq: Every day | INTRAMUSCULAR | Status: DC
  Administered 2021-05-21: 80 mg via INTRAVENOUS
  Filled 2021-05-20: qty 2

## 2021-05-20 MED ORDER — IPRATROPIUM-ALBUTEROL 0.5-2.5 (3) MG/3ML IN SOLN: 3.0000 mL | Freq: Four times a day (QID) | RESPIRATORY_TRACT | Status: DC | PRN

## 2021-05-20 MED ORDER — POLYETHYLENE GLYCOL 3350 17 G PO PACK
17.0000 g | PACK | Freq: Two times a day (BID) | ORAL | Status: AC
  Filled 2021-05-20 (×2): qty 1

## 2021-05-20 NOTE — Progress Notes (Signed)
Nutrition Follow-up  DOCUMENTATION CODES:   Obesity unspecified  INTERVENTION:   -D/c Ensure Enlive po BID, each supplement provides 350 kcal and 20 grams of protein  -Magic cup TID with meals, each supplement provides 290 kcal and 9 grams of protein  -MVI with minerals daily  NUTRITION DIAGNOSIS:   Increased nutrient needs related to acute illness (acute respiratory failure) as evidenced by estimated needs.  Ongoing  GOAL:   Patient will meet greater than or equal to 90% of their needs  Progressing   MONITOR:   PO intake, Supplement acceptance, Labs, Weight trends, I & O's  REASON FOR ASSESSMENT:   Malnutrition Screening Tool    ASSESSMENT:   60 yo female with a PMH of hypertension, hyperlipidemia, CAD s/p stents, COPD, chronic diastolic CHF, LV thrombus w/ renal and splenic infarcts back in 2021.  Acquired hypothyroidism, tobacco abuse, anxiety, and depression who presents with complaints of worsening shortness of breath over the last 3 days. Admitted with acute on chronic respiratory failure with hypoxia.  Reviewed I/O's: +220 ml x 24 hours and -1.1 L since admission  UOP: 500 ml x 24 hours  Pt resting quietly in recliner chair at time of visit. RD did not disturb.   Pt with good appetite. Noted meal completion 50-100%. Pt is refusing Ensure Enlive supplements.  Wt has been stable since admission.    Medications reviewed and include solu-medrol and miralax.   Labs reviewed: Na: 134, K: 5.2. CBGS: 136-139.  Diet Order:   Diet Order             Diet Heart Room service appropriate? Yes; Fluid consistency: Thin  Diet effective now                   EDUCATION NEEDS:   Education needs have been addressed  Skin:  Skin Assessment: Reviewed RN Assessment  Last BM:  05/19/21  Height:   Ht Readings from Last 1 Encounters:  05/15/21 5\' 4"  (1.626 m)    Weight:   Wt Readings from Last 1 Encounters:  05/18/21 86.1 kg   BMI:  Body mass index is  32.58 kg/m.  Estimated Nutritional Needs:   Kcal:  9476-5465  Protein:  95-110 grams  Fluid:  >1.75 L    Loistine Chance, RD, LDN, [place] Registered Dietitian II Certified Diabetes Care and Education Specialist Please refer to [facility] for RD and/or RD on-call/weekend/after hours pager

## 2021-05-20 NOTE — Progress Notes (Signed)
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  Productive cough with clear sputum.  + sick contacts.   Increased wheezing.  No hx of tx for copd, had albuterol at home but never had used it.   Found to be tachycardic and hypoxemic, started 5L [place] Was given solumedrol and magnesium.   Lasix was held due to AKI.  CTA done - no PE Patient was admitted to ICU, she was transferred to progressive unit under [facility] 10/19 on telemetry floor..  Assessment & Plan:   Principal Problem:   Acute on chronic respiratory failure with hypoxia (HCC) Active Problems:   Hypertension   Tobacco abuse   Coronary artery disease   COPD exacerbation (HCC)   Hypokalemia   AKI (acute kidney injury) ([place])  Acute hypoxemic respiratory failure COPD exacerbation secondary to RSV - CTA with bilateral upper lobe ground glass opacities, this is most likely of viral  --De-escalate to ceftriaxone and doxycycline for five days total --F/u cultures, strep pneumo and legionella studies --Wean supplemental oxygen for goal >90% --Continue IV steroids, I will decrease to once daily --Scheduled duonebs.  - Recommend starting Incruse prior to discharge -Was encouraged use incentive spirometry, flutter valve, I have discussed with her to get out of bed to chair,  -Patient is hypoxic with activity, and she will be discharged home with oxygen.   Mild OSA -- Will need sleep study as an outpatient   AKI --Monitor UOP/Cr   Chronic combined heart failure HTN -S/p lasix -Hold diuretics for now due to euvolemia -Continue imdur and toprol -Pete echo with low EF 40 to 45% which is at  her baseline, and with known grade 1 diastolic CHF. -She is already on Toprol, but given potassium of 5.2, will avoid ACE/ARB.   CAD, s/p stents Hx LV thrombus - resolved on 08/2020 echocardiogram --No indication for systemic anticoagulation   Anxiety --PRN xanax --Restart home wellbutrin   Hypothyroidism --Continue synthroid   DVT prophylaxis: Lovenox Code Status: Full Family Communication:  None at bedside Disposition:   Status is: Inpatient  Remains inpatient appropriate because: IV treatment, needs oxygen      Consultants:  - PCCM   Subjective: Patient reports she is feeling better today, dyspnea has improved, as well her cough has improved.  Objective: Vitals:   05/20/21 0355 05/20/21 0752 05/20/21 1223 05/20/21 1227  BP: 117/76 113/63 (!) 93/55 105/64  Pulse: 65 67 76   Resp: 17 20 20    Temp: 97.9 F (36.6 C) 98.5 F (36.9 C) 98.6 F (37 C)   TempSrc: Oral Oral Oral   SpO2: 97% 98% 91%   Weight:      Height:        Intake/Output Summary (Last 24 hours) at 05/20/2021 1459 Last data filed at 05/19/2021 2001 Gross per 24 hour  Intake 480 ml  Output --  Net 480 ml   Filed Weights   05/15/21 1926 05/18/21 0342  Weight: 88.5 kg 86.1 kg    Examination:  Awake Alert, Oriented X 3, No new F.N deficits, Normal affect Symmetrical Chest wall movement, improved  air entry, minimal wheezing. RRR,No Gallops,Rubs or new Murmurs, No Parasternal Heave +ve B.Sounds, Abd Soft, No tenderness, No rebound - guarding or rigidity. No Cyanosis, Clubbing or edema, No new Rash or bruise      Data Reviewed: I have personally reviewed following labs and imaging studies  CBC: Recent Labs  Lab 05/15/21 2110 05/17/21 0153 05/18/21 0208 05/18/21 0319 05/19/21 0126 05/20/21 0151  WBC 6.2 17.8* 15.5* 14.3* 9.4 6.9  NEUTROABS 3.8  --  13.0*  --  7.1 5.1  HGB 13.5 12.6 15.5* 15.7* 13.7 13.1  HCT 39.6 38.1 47.1* 46.5* 41.0 40.2  MCV 87.8 89.6 90.4 89.3 89.3 90.7   PLT 287 303 341 292 273 284    Basic Metabolic Panel: Recent Labs  Lab 05/16/21 0948 05/17/21 0153 05/18/21 0208 05/19/21 0126 05/20/21 0151  NA 136 131* 135 136 134*  K 3.8 4.8 4.3 4.4 5.2*  CL 101 100 99 101 100  CO2 19* 22 24 24 24   GLUCOSE 219* 144* 108* 109* 141*  BUN 9 22* 21* 22* 23*  CREATININE 1.25* 1.10* 1.14* 1.01* 1.00  CALCIUM 10.1 9.4 10.2 9.8 9.5  MG 2.3  --   --   --   --     GFR: Estimated Creatinine Clearance: 64.4 mL/min (by C-G formula based on SCr of 1 mg/dL).  Liver Function Tests: No results for input(s): AST, ALT, ALKPHOS, BILITOT, PROT, ALBUMIN in the last 168 hours.  CBG: Recent Labs  Lab 05/18/21 0345 05/18/21 0712 05/19/21 1621  GLUCAP 121* 136* 139*     Recent Results (from the past 240 hour(s))  Resp Panel by RT-PCR (Flu A&B, Covid) Nasopharyngeal Swab     Status: None   Collection Time: 05/15/21  9:11 PM   Specimen: Nasopharyngeal Swab; Nasopharyngeal(NP) swabs in vial transport medium  Result Value Ref Range Status   SARS Coronavirus 2 by RT PCR NEGATIVE NEGATIVE Final    Comment: (NOTE) SARS-CoV-2 target nucleic acids are NOT DETECTED.  The SARS-CoV-2 RNA is generally detectable in upper respiratory specimens during the acute phase of infection. The lowest concentration of SARS-CoV-2 viral copies this assay can detect is 138 copies/mL. A negative result does not preclude SARS-Cov-2 infection and should not be used as the sole basis for treatment or other patient management decisions. A negative result may occur with  improper specimen collection/handling, submission of specimen other than nasopharyngeal swab, presence of viral mutation(s) within the areas targeted by this assay, and inadequate number of viral copies(<138 copies/mL). A negative result must be combined with clinical observations, patient history, and epidemiological information. The expected result is Negative.  Fact Sheet for Patients:   EntrepreneurPulse.com.au  Fact Sheet for Healthcare Providers:  IncredibleEmployment.be  This test is no t yet approved or cleared by the [place] FDA and  has been authorized for detection and/or diagnosis of SARS-CoV-2 by FDA under an Emergency Use Authorization (EUA). This EUA will remain  in effect (meaning this test can be used) for the duration of the COVID-19 declaration under Section 564(b)(1) of the Act, 21 U.S.C.section 360bbb-3(b)(1), unless the authorization is terminated  or revoked sooner.       Influenza A by PCR NEGATIVE NEGATIVE Final   Influenza B by PCR NEGATIVE NEGATIVE Final    Comment: (NOTE) The Xpert Xpress SARS-CoV-2/FLU/RSV plus assay is intended as an aid in the diagnosis of influenza from Nasopharyngeal swab specimens and should not be used as a sole basis for treatment. Nasal washings and aspirates  are unacceptable for Xpert Xpress SARS-CoV-2/FLU/RSV testing.  Fact Sheet for Patients: EntrepreneurPulse.com.au  Fact Sheet for Healthcare Providers: IncredibleEmployment.be  This test is not yet approved or cleared by the [place] FDA and has been authorized for detection and/or diagnosis of SARS-CoV-2 by FDA under an Emergency Use Authorization (EUA). This EUA will remain in effect (meaning this test can be used) for the duration of the COVID-19 declaration under Section 564(b)(1) of the Act, 21 U.S.C. section 360bbb-3(b)(1), unless the authorization is terminated or revoked.  Performed at [facility], [address] [place], [place] [address]   Respiratory (~20 pathogens) panel by PCR     Status: Abnormal   Collection Time: 05/18/21  2:46 AM   Specimen: Nasopharyngeal Swab; Respiratory  Result Value Ref Range Status   Adenovirus NOT DETECTED NOT DETECTED Final   Coronavirus 229E NOT DETECTED NOT DETECTED Final    Comment: (NOTE) The Coronavirus on  the Respiratory Panel, DOES NOT test for the novel  Coronavirus (2019 nCoV)    Coronavirus HKU1 NOT DETECTED NOT DETECTED Final   Coronavirus NL63 NOT DETECTED NOT DETECTED Final   Coronavirus OC43 NOT DETECTED NOT DETECTED Final   Metapneumovirus NOT DETECTED NOT DETECTED Final   Rhinovirus / Enterovirus NOT DETECTED NOT DETECTED Final   Influenza A NOT DETECTED NOT DETECTED Final   Influenza B NOT DETECTED NOT DETECTED Final   Parainfluenza Virus 1 NOT DETECTED NOT DETECTED Final   Parainfluenza Virus 2 NOT DETECTED NOT DETECTED Final   Parainfluenza Virus 3 NOT DETECTED NOT DETECTED Final   Parainfluenza Virus 4 NOT DETECTED NOT DETECTED Final   Respiratory Syncytial Virus DETECTED (A) NOT DETECTED Final   Bordetella pertussis NOT DETECTED NOT DETECTED Final   Bordetella Parapertussis NOT DETECTED NOT DETECTED Final   Chlamydophila pneumoniae NOT DETECTED NOT DETECTED Final   Mycoplasma pneumoniae NOT DETECTED NOT DETECTED Final    Comment: Performed at [facility], [place] [address] [place], [place] [address]  Culture, blood (routine x 2)     Status: None (Preliminary result)   Collection Time: 05/18/21  2:56 AM   Specimen: BLOOD  Result Value Ref Range Status   Specimen Description BLOOD RIGHT ANTECUBITAL  Final   Special Requests   Final    BOTTLES DRAWN AEROBIC AND ANAEROBIC Blood Culture results may not be optimal due to an inadequate volume of blood received in culture bottles   Culture   Final    NO GROWTH 2 DAYS Performed at [facility], [place] [address] [place], [place] [address]    Report Status PENDING  Incomplete  Culture, blood (routine x 2)     Status: None (Preliminary result)   Collection Time: 05/18/21  3:21 AM   Specimen: BLOOD  Result Value Ref Range Status   Specimen Description BLOOD LEFT ANTECUBITAL  Final   Special Requests   Final    BOTTLES DRAWN AEROBIC ONLY Blood Culture results may not be optimal due to an inadequate volume of blood  received in culture bottles   Culture   Final    NO GROWTH 2 DAYS Performed at [facility], [place] [address] [place], [place] [address]    Report Status PENDING  Incomplete  MRSA Next Gen by PCR, Nasal     Status: None   Collection Time: 05/18/21  3:40 AM  Result Value Ref Range Status   MRSA by PCR Next Gen NOT DETECTED NOT DETECTED Final  Comment: (NOTE) The GeneXpert MRSA Assay (FDA approved for NASAL specimens only), is one component of a comprehensive MRSA colonization surveillance program. It is not intended to diagnose MRSA infection nor to guide or monitor treatment for MRSA infections. Test performance is not FDA approved in patients less than 57 years old. Performed at [facility], [place] [address] [place], [place] [address]          Radiology Studies: ECHOCARDIOGRAM COMPLETE  Result Date: 05/19/2021    ECHOCARDIOGRAM REPORT   Patient Name:   HARLYN RATHMANN Fines Date of Exam: 05/19/2021 Medical Rec #:  [id number]      Height:       64.0 in Accession #:    [id number]     Weight:       189.8 lb Date of Birth:  [birth year]     BSA:          1.913 m Patient Age:    67 years       BP:           123/71 mmHg Patient Gender: F              HR:           82 bpm. Exam Location:  Inpatient Procedure: 2D Echo, Cardiac Doppler, Color Doppler and Intracardiac            Opacification Agent Indications:    R06.9 DOE  History:        Patient has prior history of Echocardiogram examinations, most                 recent 08/27/2020. CHF, CAD, COPD; Risk Factors:Hypertension and                 Dyslipidemia.  Sonographer:    Raquel Sarna Senior RDCS Referring Phys: [id number] Collier Bullock  Sonographer Comments: Technically difficult due to very poor echo windows, even with definity. Active severe COPD flare-up. IMPRESSIONS  1. Left ventricular ejection fraction, by estimation, is 40 to 45%. The left ventricle has mildly decreased function. The left ventricle demonstrates global hypokinesis. Left  ventricular diastolic parameters are consistent with Grade I diastolic dysfunction (impaired relaxation).  2. Right ventricular systolic function was not well visualized. The right ventricular size is normal. Tricuspid regurgitation signal is inadequate for assessing PA pressure.  3. The mitral valve is normal in structure. No evidence of mitral valve regurgitation. No evidence of mitral stenosis.  4. The aortic valve was not well visualized. Aortic valve regurgitation is not visualized. No aortic stenosis is present.  5. The inferior vena cava is normal in size with greater than 50% respiratory variability, suggesting right atrial pressure of 3 mmHg. FINDINGS  Left Ventricle: Left ventricular ejection fraction, by estimation, is 40 to 45%. The left ventricle has mildly decreased function. The left ventricle demonstrates global hypokinesis. Definity contrast agent was given IV to delineate the left ventricular  endocardial borders. The left ventricular internal cavity size was normal in size. There is no left ventricular hypertrophy. Left ventricular diastolic parameters are consistent with Grade I diastolic dysfunction (impaired relaxation). Right Ventricle: The right ventricular size is normal. No increase in right ventricular wall thickness. Right ventricular systolic function was not well visualized. Tricuspid regurgitation signal is inadequate for assessing PA pressure. Left Atrium: Left atrial size was not well visualized. Right Atrium: Right atrial size was not well visualized. Pericardium: There is no evidence of pericardial effusion. Presence of pericardial fat pad. Mitral Valve: The mitral  valve is normal in structure. No evidence of mitral valve regurgitation. No evidence of mitral valve stenosis. Tricuspid Valve: The tricuspid valve is not well visualized. Tricuspid valve regurgitation is not demonstrated. No evidence of tricuspid stenosis. Aortic Valve: The aortic valve was not well visualized. Aortic  valve regurgitation is not visualized. No aortic stenosis is present. Pulmonic Valve: The pulmonic valve was not well visualized. Pulmonic valve regurgitation is not visualized. No evidence of pulmonic stenosis. Aorta: The aortic root is normal in size and structure. Venous: The inferior vena cava is normal in size with greater than 50% respiratory variability, suggesting right atrial pressure of 3 mmHg. IAS/Shunts: No atrial level shunt detected by color flow Doppler.  LEFT VENTRICLE PLAX 2D LVIDd:         4.40 cm Diastology LVIDs:         3.40 cm LV e' medial:    4.46 cm/s LV PW:         0.80 cm LV E/e' medial:  12.4 LV IVS:        0.70 cm LV e' lateral:   5.11 cm/s                        LV E/e' lateral: 10.8  LEFT ATRIUM             Index LA diam:        3.50 cm 1.83 cm/m LA Vol (A2C):   42.4 ml 22.16 ml/m LA Vol (A4C):   16.6 ml 8.68 ml/m LA Biplane Vol: 26.7 ml 13.95 ml/m  AORTIC VALVE LVOT Vmax:   74.40 cm/s LVOT Vmean:  56.300 cm/s LVOT VTI:    0.134 m MITRAL VALVE MV Area (PHT): 3.00 cm    SHUNTS MV Decel Time: 253 msec    Systemic VTI: 0.13 m MV E velocity: 55.30 cm/s MV A velocity: 73.70 cm/s MV E/A ratio:  0.75 Kardie Tobb DO Electronically signed by Berniece Salines DO Signature Date/Time: 05/19/2021/2:02:05 PM    Final         Scheduled Meds:  aspirin EC  81 mg Oral Daily   buPROPion  300 mg Oral Daily   Chlorhexidine Gluconate Cloth  6 each Topical Daily   enoxaparin (LOVENOX) injection  40 mg Subcutaneous Q24H   guaiFENesin  600 mg Oral BID   isosorbide mononitrate  60 mg Oral Daily   levothyroxine  112 mcg Oral Q0600   linaclotide  145 mcg Oral Daily   mouth rinse  15 mL Mouth Rinse BID   [START ON 05/21/2021] methylPREDNISolone (SOLU-MEDROL) injection  80 mg Intravenous Daily   metoprolol succinate  12.5 mg Oral Daily   multivitamin with minerals  1 tablet Oral Daily   nicotine  21 mg Transdermal Daily   polyethylene glycol  17 g Oral BID   sodium chloride flush  3 mL  Intravenous Q12H   Continuous Infusions:  cefTRIAXone (ROCEPHIN)  IV 1 g (05/19/21 1830)     LOS: 4 days      Phillips Climes, MD [facility]   To contact the attending provider between 7A-7P or the covering provider during after hours 7P-7A, please log into the web site [email or web address] and access using universal B and E password for that web site. If you do not have the password, please call the hospital operator.  05/20/2021, 2:59 PM

## 2021-05-20 NOTE — Evaluation (Signed)
Physical Therapy Evaluation Patient Details Name: Jocelyn [name] MRN: [id number] DOB: February 18, 1961 Today's Date: 05/20/2021  History of Present Illness  Mrs. 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  Clinical Impression  Patient presents with decrease activity tolerance noting SpO2 85-96% on RA with ambulation and RR up to 40 at times.  She demonstrates mobility without assistance, but lives alone in two level townhome and will benefit from further skilled PT in the acute setting to ensure safety on stairs and for energy conservation education.  No follow up PT needed at d/c.      Recommendations for follow up therapy are one component of a multi-disciplinary discharge planning process, led by the attending physician.  Recommendations may be updated based on patient status, additional functional criteria and insurance authorization.  Follow Up Recommendations No PT follow up    Equipment Recommendations  None recommended by PT    Recommendations for Other Services       Precautions / Restrictions Precautions Precaution Comments: watch O2      Mobility  Bed Mobility Overal bed mobility: Independent                  Transfers Overall transfer level: Independent                  Ambulation/Gait Ambulation/Gait assistance: Independent Gait Distance (Feet): 200 Feet Assistive device: None Gait Pattern/deviations: WFL(Within Functional Limits)     General Gait Details: monitoring SpO2 on RA throughout  Stairs            Wheelchair Mobility    Modified Rankin (Stroke Patients Only)       Balance Overall balance assessment: Independent (stood to don mesh briefs after toileting placing pad in squat position no LOB and no UE support)                                           Pertinent Vitals/Pain       Home Living Family/patient expects to be discharged to:: Private residence Living Arrangements: Alone Available Help at Discharge: Family;Available PRN/intermittently Type of Home: Other(Comment) (townhouse) Home Access: Stairs to enter Entrance Stairs-Rails: None Entrance Stairs-Number of Steps: 3 Home Layout: Two level;Bed/bath upstairs Home Equipment: Cane - single point;Shower seat      Prior Function Level of Independence: Independent               Hand Dominance        Extremity/Trunk Assessment   Upper Extremity Assessment Upper Extremity Assessment: Overall WFL for tasks assessed    Lower Extremity Assessment Lower Extremity Assessment: Overall WFL for tasks assessed    Cervical / Trunk Assessment Cervical / Trunk Assessment: Normal  Communication   Communication: No difficulties  Cognition Arousal/Alertness: Awake/alert Behavior During Therapy: WFL for tasks assessed/performed Overall Cognitive Status: Within Functional Limits for tasks assessed                                        General Comments General comments (skin integrity, edema, etc.): SpO2 range 85-96% on RA throughout activity; see Oxygen qualifying note; pt refusing home O2 at  this time    Exercises     Assessment/Plan    PT Assessment Patient needs continued PT services  PT Problem List Decreased activity tolerance;Cardiopulmonary status limiting activity;Decreased safety awareness       PT Treatment Interventions Stair training;Gait training;Patient/family education;Functional mobility training    PT Goals (Current goals can be found in the Care Plan section)  Acute Rehab PT Goals Patient Stated Goal: to return to independent PT Goal Formulation: With patient Time For Goal Achievement: 05/27/21 Potential to Achieve Goals: Good    Frequency Min 3X/week   Barriers to discharge Decreased caregiver support      Co-evaluation                AM-PAC PT "6 Clicks" Mobility  Outcome Measure Help needed turning from your back to your side while in a flat bed without using bedrails?: None Help needed moving from lying on your back to sitting on the side of a flat bed without using bedrails?: None Help needed moving to and from a bed to a chair (including a wheelchair)?: None Help needed standing up from a chair using your arms (e.g., wheelchair or bedside chair)?: None Help needed to walk in hospital room?: None Help needed climbing 3-5 steps with a railing? : Total 6 Click Score: 21    End of Session   Activity Tolerance: Patient tolerated treatment well (though tachypneic) Patient left: in chair;with call bell/phone within reach;with chair alarm set   PT Visit Diagnosis: Difficulty in walking, not elsewhere classified (R26.2)    Time: 9518-8416 PT Time Calculation (min) (ACUTE ONLY): 37 min   Charges:   PT Evaluation $PT Eval Low Complexity: 1 Low PT Treatments $Therapeutic Activity: 8-22 mins        Magda Kiel, PT Acute Rehabilitation Services [phone] Office:[phone] 05/20/2021   Reginia Naas 05/20/2021, 1:06 PM

## 2021-05-20 NOTE — Progress Notes (Signed)
SATURATION QUALIFICATIONS: (This note is used to comply with regulatory documentation for home oxygen)  Patient Saturations on Room Air at Rest = 91%  Patient Saturations on Room Air while Ambulating = 85%  Patient Saturations on 2 Liters of oxygen while Ambulating = 92%  Please briefly explain why patient needs home oxygen: Patient demonstrates mobility without O2 with SpO2 ranging 85-96%.  She has RR up to 40's with ambulation, and may benefit from home O2 initially.  She is considering it.    Magda Kiel, PT Acute Rehabilitation Services CBSWH:[phone] [phone] 05/20/2021

## 2021-05-21 LAB — BASIC METABOLIC PANEL
Anion gap: 8 (ref 5–15)
BUN: 26 mg/dL — ABNORMAL HIGH (ref 6–20)
CO2: 26 mmol/L (ref 22–32)
Calcium: 9.6 mg/dL (ref 8.9–10.3)
Chloride: 101 mmol/L (ref 98–111)
Creatinine, Ser: 0.94 mg/dL (ref 0.44–1.00)
GFR, Estimated: >60 mL/min (ref >60)
Glucose, Bld: 91 mg/dL (ref 70–99)
Potassium: 4.7 mmol/L (ref 3.5–5.1)
Sodium: 135 mmol/L (ref 135–145)

## 2021-05-21 MED ORDER — GUAIFENESIN ER 600 MG PO TB12: 600.0000 mg | ORAL_TABLET | Freq: Two times a day (BID) | ORAL | 0 refills | Status: AC

## 2021-05-21 MED ORDER — SODIUM CHLORIDE 0.9 % IV SOLN
1.0000 g | INTRAVENOUS | Status: AC
  Administered 2021-05-21: 1 g via INTRAVENOUS
  Filled 2021-05-21: qty 10

## 2021-05-21 MED ORDER — METOPROLOL SUCCINATE ER 25 MG PO TB24: 12.5000 mg | ORAL_TABLET | Freq: Every day | ORAL | 0 refills | Status: DC

## 2021-05-21 MED ORDER — IPRATROPIUM-ALBUTEROL 0.5-2.5 (3) MG/3ML IN SOLN: 3.0000 mL | Freq: Four times a day (QID) | RESPIRATORY_TRACT | 1 refills | Status: DC | PRN

## 2021-05-21 MED ORDER — PREDNISONE 10 MG (21) PO TBPK
ORAL_TABLET | ORAL | 0 refills | Status: DC
Start: 2021-05-21 — End: 2021-06-10

## 2021-05-21 NOTE — Discharge Instructions (Signed)
Follow with Primary MD Benito Mccreedy, MD in 7 days   Get CBC, CMP, 2 view Chest X ray checked  by Primary MD next visit.    Activity: As tolerated with Full fall precautions use walker/cane & assistance as needed   Disposition Home    Diet: Heart Healthy  , with feeding assistance and aspiration precautions.   On your next visit with your primary care physician please Get Medicines reviewed and adjusted.   Please request your Prim.MD to go over all Hospital Tests and Procedure/Radiological results at the follow up, please get all Hospital records sent to your Prim MD by signing hospital release before you go home.   If you experience worsening of your admission symptoms, develop shortness of breath, life threatening emergency, suicidal or homicidal thoughts you must seek medical attention immediately by calling 911 or calling your MD immediately  if symptoms less severe.  You Must read complete instructions/literature along with all the possible adverse reactions/side effects for all the Medicines you take and that have been prescribed to you. Take any new Medicines after you have completely understood and accpet all the possible adverse reactions/side effects.   Do not drive, operating heavy machinery, perform activities at heights, swimming or participation in water activities or provide baby sitting services if your were admitted for syncope or siezures until you have seen by Primary MD or a Neurologist and advised to do so again.  Do not drive when taking Pain medications.    Do not take more than prescribed Pain, Sleep and Anxiety Medications  Special Instructions: If you have smoked or chewed Tobacco  in the last 2 yrs please stop smoking, stop any regular Alcohol  and or any Recreational drug use.  Wear Seat belts while driving.   Please note  You were cared for by a hospitalist during your hospital stay. If you have any questions about your discharge medications or  the care you received while you were in the hospital after you are discharged, you can call the unit and asked to speak with the hospitalist on call if the hospitalist that took care of you is not available. Once you are discharged, your primary care physician will handle any further medical issues. Please note that NO REFILLS for any discharge medications will be authorized once you are discharged, as it is imperative that you return to your primary care physician (or establish a relationship with a primary care physician if you do not have one) for your aftercare needs so that they can reassess your need for medications and monitor your lab values.

## 2021-05-21 NOTE — TOC Initial Note (Addendum)
Transition of Care [facility]) - Initial/Assessment Note    Patient Details  Name: Jocelyn [name] MRN: [id number] Date of Birth: June 10, 1961  [facility] CM/SW Contact:    Cyndi Bender, RN Phone Number: 05/21/2021, 9:35 AM  Clinical Narrative:                 Order for home oxygen. Spoke to patient about ordering her 71. Patient agreeable to use [employer]. Spoke to Anderson with [employer] and 02 ordered.  05/21/21 1126 MD ordered nebulizer machine as well. Notified Zack with [employer].   Expected Discharge Plan: Home/Self Care Barriers to Discharge: Continued Medical Work up   Patient Goals and CMS Choice Patient states their goals for this hospitalization and ongoing recovery are:: return home      Expected Discharge Plan and Services Expected Discharge Plan: Home/Self Care   Discharge Planning Services: CM Consult Post Acute Care Choice: Durable Medical Equipment Living arrangements for the past 2 months: Apartment                 DME Arranged: Oxygen DME Agency: [employer] Date DME Agency Contacted: 05/21/21 Time DME Agency Contacted: [phone] Representative spoke with at DME Agency: [place]            Prior Living Arrangements/Services Living arrangements for the past 2 months: Apartment   Patient language and need for interpreter reviewed:: Yes Do you feel safe going back to the place where you live?: Yes      Need for Family Participation in Patient Care: No (Comment) Care giver support system in place?: Yes (comment)   Criminal Activity/Legal Involvement Pertinent to Current Situation/Hospitalization: No - Comment as needed  Activities of Daily Living Home Assistive Devices/Equipment: Eyeglasses, Dentures (specify type), Shower chair with back, Grab bars in shower ADL Screening (condition at time of admission) Patient's cognitive ability adequate to safely complete daily activities?: Yes Is the patient deaf or have difficulty hearing?: No Does the  patient have difficulty seeing, even when wearing glasses/contacts?: No Does the patient have difficulty concentrating, remembering, or making decisions?: No Patient able to express need for assistance with ADLs?: Yes Does the patient have difficulty dressing or bathing?: Yes Independently performs ADLs?: Yes (appropriate for developmental age) Does the patient have difficulty walking or climbing stairs?: Yes Weakness of Legs: Both Weakness of Arms/Hands: None  Permission Sought/Granted Permission sought to share information with : Facility Art therapist granted to share information with : Yes, Verbal Permission Granted     Permission granted to share info w AGENCY: DME        Emotional Assessment Appearance:: Appears older than stated age Attitude/Demeanor/Rapport: Engaged Affect (typically observed): Accepting, Appropriate Orientation: : Oriented to Self, Oriented to Place, Oriented to  Time, Oriented to Situation Alcohol / Substance Use: Not Applicable Psych Involvement: No (comment)  Admission diagnosis:  Acute hypokalemia [E87.6] COPD exacerbation ([place]) [J44.1] Patient Active Problem List   Diagnosis Date Noted   COPD exacerbation ([place]) 05/16/2021   Acute on chronic respiratory failure with hypoxia ([place]) 05/16/2021   Hypokalemia 05/16/2021   AKI (acute kidney injury) ([place]) 05/16/2021   LV (left ventricular) mural thrombus 05/25/2020   Renal infarct ([place]) 05/16/2020   Hypoxia 05/16/2020   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   Proptosis 12/27/2019   Multinodular goiter 03/27/2019   Hypothyroidism 09/26/2018   Sacroiliitis, not elsewhere classified ([place]) 04/24/2017   Chronic right-sided low back pain with right-sided sciatica 01/12/2017   Chronic bilateral thoracic back pain 01/12/2017  Other spondylosis with radiculopathy, lumbar region 10/19/2016   Depression with anxiety 07/16/2016   Chronic diastolic CHF (congestive heart failure) ([place]) 07/16/2016    Aortic atherosclerosis ([place]) 11/24/2015   Unstable angina ([facility]) 08/21/2015   Chest pain    Coronary artery disease    CAD (coronary artery disease), native coronary artery 01/14/2015   Hypertension 01/14/2015   Hypercholesterolemia 01/14/2015   Tobacco abuse 01/14/2015   PCP:  Benito Mccreedy, MD Pharmacy:   [id number] - [place], [place] [address] Phone: [phone] Fax: [phone]  [id number] - [place], [place] - [address] [facility] [place] [address] Phone: [phone] Fax: [phone]     Social Determinants of Health (SDOH) Interventions    Readmission Risk Interventions No flowsheet data found.

## 2021-05-21 NOTE — Progress Notes (Signed)
Physical Therapy Treatment Patient Details Name: Jocelyn [name] MRN: [id number] DOB: 1961-05-22 Today's Date: 05/21/2021   History of Present Illness 60 year old woman presented with SOB x 3 days on 05/16/21.   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  Pt is in a frame of mind that she is going to move fast, but talked with her about the reality of her coughing and use of O2 that will need observation of pacing and resting as needed.  Pt is certainly strong but acknowledges her limits with O2 and SOB.   Follow up if dc is held for further mobility but pt is aware of her need to pace activity. Pt does demonstrate safety with O2 tank and moving on hallway, aware of handrail stability to walk on stairs.   Recommendations for follow up therapy are one component of a multi-disciplinary discharge planning process, led by the attending physician.  Recommendations may be updated based on patient status, additional functional criteria and insurance authorization.  Follow Up Recommendations  No PT follow up (follow up with PCP for breathing issues)     Equipment Recommendations  None recommended by PT    Recommendations for Other Services       Precautions / Restrictions Precautions Precautions: Fall Precaution Comments: watch O2 Restrictions Weight Bearing Restrictions: No     Mobility  Bed Mobility Overal bed mobility: Independent                  Transfers Overall transfer level: Independent Equipment used: None             General transfer comment: pt is up to side of bed and standing without PT helping  Ambulation/Gait Ambulation/Gait assistance: Supervision Gait Distance (Feet): 200 Feet Assistive device: None Gait Pattern/deviations: Step-through pattern;Narrow base of support Gait  velocity: reduced   General Gait Details: left on O2 at 3-4 L due to SOB and sats   Stairs Stairs: Yes Stairs assistance: Supervision;Min guard Stair Management: One rail Right;Alternating pattern;Forwards Number of Stairs: 12 General stair comments: pt demonstrates ability to climb and descend steps but SOB with the effort despite sats being controlled on 3L O2   Wheelchair Mobility    Modified Rankin (Stroke Patients Only)       Balance Overall balance assessment: Independent                                          Cognition Arousal/Alertness: Awake/alert Behavior During Therapy: WFL for tasks assessed/performed Overall Cognitive Status: Within Functional Limits for tasks assessed                                        Exercises      General Comments General comments (skin integrity, edema, etc.): pt was independent for balance but did use HHA on stairs to control safety given her use of tank air on that activity      Pertinent Vitals/Pain Pain Assessment: No/denies pain    Home Living  Prior Function            PT Goals (current goals can now be found in the care plan section) Acute Rehab PT Goals Patient Stated Goal: to return to independent Progress towards PT goals: Progressing toward goals    Frequency    Min 3X/week      PT Plan Current plan remains appropriate    Co-evaluation              AM-PAC PT "6 Clicks" Mobility   Outcome Measure  Help needed turning from your back to your side while in a flat bed without using bedrails?: None Help needed moving from lying on your back to sitting on the side of a flat bed without using bedrails?: None Help needed moving to and from a bed to a chair (including a wheelchair)?: None Help needed standing up from a chair using your arms (e.g., wheelchair or bedside chair)?: None Help needed to walk in hospital room?: None Help  needed climbing 3-5 steps with a railing? : A Little 6 Click Score: 23    End of Session Equipment Utilized During Treatment: Gait belt;Oxygen Activity Tolerance: Patient limited by fatigue;Treatment limited secondary to medical complications (Comment) Patient left: in bed;with call bell/phone within reach;with bed alarm set Nurse Communication: Mobility status PT Visit Diagnosis: Other abnormalities of gait and mobility (R26.89)     Time: 2446-2863 PT Time Calculation (min) (ACUTE ONLY): 24 min  Charges:  $Gait Training: 8-22 mins $Therapeutic Activity: 8-22 mins           Ramond Dial 05/21/2021, 5:32 PM  Mee Hives, PT PhD Acute Rehab Dept. Number: [place]

## 2021-05-21 NOTE — Discharge Summary (Signed)
Physician Discharge Summary  AMARII AMY QPY:[id number] DOB: 12/05/1960 DOA: 05/15/2021  PCP: Benito Mccreedy, MD  Admit date: 05/15/2021 Discharge date: 05/21/2021  Admitted From: Home Disposition:  Home  Recommendations for Outpatient Follow-up:  Follow up with PCP in 1-2 weeks Please obtain BMP/CBC in one week Recommend sleep study as an outpatient   Home Health:NO Equipment/Devices: oxygen 2 L, nebs  Discharge Condition:Stable CODE STATUS:FULL Diet recommendation: Heart Healthy   Brief/Interim Summary:  Mrs. 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  Productive cough with clear sputum.  + sick contacts.   Increased wheezing.  No hx of tx for copd, had albuterol at home but never had used it.   Found to be tachycardic and hypoxemic, started 5L Interior Was given solumedrol and magnesium.   Lasix was held due to AKI.  CTA done - no PE Patient was admitted to ICU, she was transferred to progressive unit under [facility] 10/19 on telemetry floor.  Patient continued to improve with improved oxygen requirement, she is currently requiring 2 L nasal cannula with activity, oxygen has been arranged on discharge.   Acute hypoxemic respiratory failure, with possible pneumonia, likely of viral etiology. COPD exacerbation secondary to RSV - CTA with bilateral upper lobe ground glass opacities, this is most likely of viral  -- Treated total of 5 days of antibiotics, no further antibiotics on discharge -Needed with IV steroids, this is to be transitioned to prednisone taper on discharge. -She will be discharged with DuoNebs as needed, DME for nebs machine has been arranged - Recommend starting Incruse prior to discharge -Patient is hypoxic with activity, she will be discharged on 2 L nasal cannula   Mild OSA -- Will need sleep study as an  outpatient   AKI --Monitor UOP/Cr   Chronic combined heart failure HTN -Euvolemic at time of discharge -Continue imdur and toprol -Repeat echo with low EF 40 to 45% which is at her baseline, and with known grade 1 diastolic CHF. -She is already on Toprol, but given potassium of 5.2, will avoid ACE/ARB on discharge, this can be addressed by her cardiologist out as an outpatient.   CAD, s/p stents Hx LV thrombus - resolved on 08/2020 echocardiogram --No indication for systemic anticoagulation   Anxiety --PRN xanax --Restart home wellbutrin   Hypothyroidism --Continue synthroid    Discharge Diagnoses:  Principal Problem:   Acute on chronic respiratory failure with hypoxia (HCC) Active Problems:   Hypertension   Tobacco abuse   Coronary artery disease   COPD exacerbation (HCC)   Hypokalemia   AKI (acute kidney injury) [facility])    Discharge Instructions  Discharge Instructions     Diet - low sodium heart healthy   Complete by: As directed    Discharge instructions   Complete by: As directed    Follow with Primary MD Benito Mccreedy, MD in 7 days   Get CBC, CMP, 2 view Chest X ray checked  by Primary MD next visit.    Activity: As tolerated with Full fall precautions use walker/cane & assistance as needed   Disposition Home    Diet: Heart Healthy  , with feeding assistance and aspiration precautions.   On your next visit with your primary care physician please Get Medicines reviewed and adjusted.   Please request your Prim.MD to go over all  Hospital Tests and Procedure/Radiological results at the follow up, please get all Hospital records sent to your Prim MD by signing hospital release before you go home.   If you experience worsening of your admission symptoms, develop shortness of breath, life threatening emergency, suicidal or homicidal thoughts you must seek medical attention immediately by calling 911 or calling your MD immediately  if symptoms less  severe.  You Must read complete instructions/literature along with all the possible adverse reactions/side effects for all the Medicines you take and that have been prescribed to you. Take any new Medicines after you have completely understood and accpet all the possible adverse reactions/side effects.   Do not drive, operating heavy machinery, perform activities at heights, swimming or participation in water activities or provide baby sitting services if your were admitted for syncope or siezures until you have seen by Primary MD or a Neurologist and advised to do so again.  Do not drive when taking Pain medications.    Do not take more than prescribed Pain, Sleep and Anxiety Medications  Special Instructions: If you have smoked or chewed Tobacco  in the last 2 yrs please stop smoking, stop any regular Alcohol  and or any Recreational drug use.  Wear Seat belts while driving.   Please note  You were cared for by a hospitalist during your hospital stay. If you have any questions about your discharge medications or the care you received while you were in the hospital after you are discharged, you can call the unit and asked to speak with the hospitalist on call if the hospitalist that took care of you is not available. Once you are discharged, your primary care physician will handle any further medical issues. Please note that NO REFILLS for any discharge medications will be authorized once you are discharged, as it is imperative that you return to your primary care physician (or establish a relationship with a primary care physician if you do not have one) for your aftercare needs so that they can reassess your need for medications and monitor your lab values.   Increase activity slowly   Complete by: As directed       Allergies as of 05/21/2021       Reactions   Hydrocodone Nausea Only   Has problems with high doses        Medication List     TAKE these medications    albuterol  108 (90 Base) MCG/ACT inhaler Commonly known as: VENTOLIN HFA Inhale 2 puffs into the lungs every 4 (four) hours as needed for shortness of breath.   ALPRAZolam 1 MG tablet Commonly known as: XANAX Take 1 tablet by mouth 3 (three) times daily as needed for anxiety.   aspirin EC 81 MG tablet Take 1 tablet (81 mg total) by mouth daily. Swallow whole.   cyclobenzaprine 10 MG tablet Commonly known as: FLEXERIL Take 1 tablet (10 mg total) by mouth 3 (three) times daily as needed for muscle spasms.   docusate sodium 100 MG capsule Commonly known as: COLACE Take 100 mg by mouth 2 (two) times daily as needed for constipation.   furosemide 40 MG tablet Commonly known as: LASIX Take 40 mg by mouth daily.   gabapentin 300 MG capsule Commonly known as: NEURONTIN Take 300 mg by mouth daily as needed (For pain).   guaiFENesin 600 MG 12 hr tablet Commonly known as: MUCINEX Take 1 tablet (600 mg total) by mouth 2 (two) times daily for 10 days.  HYDROcodone-acetaminophen 5-325 MG tablet Commonly known as: NORCO/VICODIN Take 1-2 tablets by mouth every 6 (six) hours as needed for moderate pain.   ipratropium-albuterol 0.5-2.5 (3) MG/3ML Soln Commonly known as: DUONEB Take 3 mLs by nebulization every 6 (six) hours as needed.   isosorbide mononitrate 30 MG 24 hr tablet Commonly known as: IMDUR Take 2 tablets (60 mg total) by mouth daily.   levothyroxine 112 MCG tablet Commonly known as: SYNTHROID TAKE 1 TABLET BY MOUTH EVERY MORNING   Linzess 145 MCG Caps capsule Generic drug: linaclotide Take 145 mcg by mouth daily.   metoprolol succinate 25 MG 24 hr tablet Commonly known as: TOPROL-XL Take 0.5 tablets (12.5 mg total) by mouth daily.   nitroGLYCERIN 0.3 MG SL tablet Commonly known as: NITROSTAT Place 0.3 mg under the tongue every 5 (five) minutes as needed for chest pain.   predniSONE 10 MG (21) Tbpk tablet Commonly known as: STERAPRED UNI-PAK 21 TAB Use per package  instructions   rosuvastatin 20 MG tablet Commonly known as: CRESTOR Take 1 tablet (20 mg total) by mouth daily.   triamcinolone cream 0.5 % Commonly known as: KENALOG Apply 1 application topically daily as needed (for sun exposure).   UNABLE TO FIND Take 2 tablets by mouth daily. Med Name: Garlic and Honey (homemade regimen) - two tablets a day with tsp of honey per patient               Durable Medical Equipment  (From admission, onward)           Start     Ordered   05/21/21 1117  For home use only DME Nebulizer machine  Once       Question Answer Comment  Patient needs a nebulizer to treat with the following condition COPD (chronic obstructive pulmonary disease) ([place])   Length of Need 12 Months      05/21/21 1117   05/20/21 1459  For home use only DME oxygen  Once       Question Answer Comment  Length of Need 6 Months   Mode or (Route) Nasal cannula   Liters per Minute 2   Frequency Continuous (stationary and portable oxygen unit needed)   Oxygen conserving device Yes   Oxygen delivery system Gas      05/20/21 1458            Follow-up Information     [facility] Follow up.   Why: Home Oxygen ordered through [facility]) Contact information: [address] [place] [address] [phone]         Benito Mccreedy, MD Follow up.   Specialty: Internal Medicine Contact information: [address] [place] [address] [phone]                Allergies  Allergen Reactions   Hydrocodone Nausea Only    Has problems with high doses    Consultations: PCCM   Procedures/Studies: CT Angio Chest Pulmonary Embolism (PE) W or WO Contrast  Result Date: 05/16/2021 CLINICAL DATA:  Shortness of breath and positive D-dimer EXAM: CT ANGIOGRAPHY CHEST WITH CONTRAST TECHNIQUE: Multidetector CT imaging of the chest was performed using the standard protocol during bolus administration of intravenous  contrast. Multiplanar CT image reconstructions and MIPs were obtained to evaluate the vascular anatomy. CONTRAST:  35mL OMNIPAQUE IOHEXOL 350 MG/ML SOLN COMPARISON:  Chest x-ray from the previous day. FINDINGS: Cardiovascular: Thoracic aorta and its branches demonstrate atherosclerotic calcification without aneurysmal dilatation or  dissection. Pulmonary artery shows a normal branching pattern. No filling defect to suggest pulmonary embolism is noted. Mild coronary calcifications are seen. No cardiac enlargement is noted. Mediastinum/Nodes: Thoracic inlet is within normal limits. The esophagus as visualized is within normal limits. No sizable hilar or mediastinal adenopathy is noted. Lungs/Pleura: Lungs are well aerated bilaterally. Mild emphysematous changes are seen. Patchy ground-glass airspace opacities are noted bilaterally predominately within the upper lobes consistent with postinflammatory change. No sizable effusion is noted. No sizable parenchymal nodules are seen. Upper Abdomen: Visualized upper abdomen shows no acute abnormality. Musculoskeletal: Degenerative changes of the thoracic spine are noted. No acute rib abnormality is noted. Review of the MIP images confirms the above findings. IMPRESSION: No evidence of pulmonary emboli. Patchy ground-glass opacities consistent with a postinflammatory nature predominately within the upper lobes. No other focal abnormality is seen. Electronically Signed   By: Inez Catalina M.D.   On: 05/16/2021 23:23   DG CHEST PORT 1 VIEW  Result Date: 05/18/2021 CLINICAL DATA:  Respiratory distress EXAM: PORTABLE CHEST 1 VIEW COMPARISON:  05/16/2021 CT FINDINGS: Cardiac shadow is within normal limits. The lungs are well aerated bilaterally. The changes seen on recent CT are not well appreciated on plain film examination. No sizable effusion is seen. No bony abnormality is noted. IMPRESSION: No acute abnormality noted. The findings on recent chest CT are not well appreciated  on this study. Electronically Signed   By: Inez Catalina M.D.   On: 05/18/2021 00:17   DG Chest Port 1 View  Result Date: 05/15/2021 CLINICAL DATA:  Cough and shortness of breath. EXAM: PORTABLE CHEST 1 VIEW COMPARISON:  May 16, 2020 FINDINGS: The heart size and mediastinal contours are within normal limits. No focal consolidation. No visible pleural effusion or pneumothorax. The visualized skeletal structures are unremarkable. IMPRESSION: No acute cardiopulmonary disease. Electronically Signed   By: Dahlia Bailiff M.D.   On: 05/15/2021 21:13   ECHOCARDIOGRAM COMPLETE  Result Date: 05/19/2021    ECHOCARDIOGRAM REPORT   Patient Name:   WILHEMINA GRALL Paulo Date of Exam: 05/19/2021 Medical Rec #:  [id number]      Height:       64.0 in Accession #:    [id number]     Weight:       189.8 lb Date of Birth:  1960-10-26     BSA:          1.913 m Patient Age:    60 years       BP:           123/71 mmHg Patient Gender: F              HR:           82 bpm. Exam Location:  Inpatient Procedure: 2D Echo, Cardiac Doppler, Color Doppler and Intracardiac            Opacification Agent Indications:    R06.9 DOE  History:        Patient has prior history of Echocardiogram examinations, most                 recent 08/27/2020. CHF, CAD, COPD; Risk Factors:Hypertension and                 Dyslipidemia.  Sonographer:    Raquel Sarna Senior RDCS Referring Phys: [id number] Collier Bullock  Sonographer Comments: Technically difficult due to very poor echo windows, even with definity. Active severe COPD flare-up. IMPRESSIONS  1. Left ventricular ejection fraction, by estimation, is  40 to 45%. The left ventricle has mildly decreased function. The left ventricle demonstrates global hypokinesis. Left ventricular diastolic parameters are consistent with Grade I diastolic dysfunction (impaired relaxation).  2. Right ventricular systolic function was not well visualized. The right ventricular size is normal. Tricuspid regurgitation signal is inadequate  for assessing PA pressure.  3. The mitral valve is normal in structure. No evidence of mitral valve regurgitation. No evidence of mitral stenosis.  4. The aortic valve was not well visualized. Aortic valve regurgitation is not visualized. No aortic stenosis is present.  5. The inferior vena cava is normal in size with greater than 50% respiratory variability, suggesting right atrial pressure of 3 mmHg. FINDINGS  Left Ventricle: Left ventricular ejection fraction, by estimation, is 40 to 45%. The left ventricle has mildly decreased function. The left ventricle demonstrates global hypokinesis. Definity contrast agent was given IV to delineate the left ventricular  endocardial borders. The left ventricular internal cavity size was normal in size. There is no left ventricular hypertrophy. Left ventricular diastolic parameters are consistent with Grade I diastolic dysfunction (impaired relaxation). Right Ventricle: The right ventricular size is normal. No increase in right ventricular wall thickness. Right ventricular systolic function was not well visualized. Tricuspid regurgitation signal is inadequate for assessing PA pressure. Left Atrium: Left atrial size was not well visualized. Right Atrium: Right atrial size was not well visualized. Pericardium: There is no evidence of pericardial effusion. Presence of pericardial fat pad. Mitral Valve: The mitral valve is normal in structure. No evidence of mitral valve regurgitation. No evidence of mitral valve stenosis. Tricuspid Valve: The tricuspid valve is not well visualized. Tricuspid valve regurgitation is not demonstrated. No evidence of tricuspid stenosis. Aortic Valve: The aortic valve was not well visualized. Aortic valve regurgitation is not visualized. No aortic stenosis is present. Pulmonic Valve: The pulmonic valve was not well visualized. Pulmonic valve regurgitation is not visualized. No evidence of pulmonic stenosis. Aorta: The aortic root is normal in size  and structure. Venous: The inferior vena cava is normal in size with greater than 50% respiratory variability, suggesting right atrial pressure of 3 mmHg. IAS/Shunts: No atrial level shunt detected by color flow Doppler.  LEFT VENTRICLE PLAX 2D LVIDd:         4.40 cm Diastology LVIDs:         3.40 cm LV e' medial:    4.46 cm/s LV PW:         0.80 cm LV E/e' medial:  12.4 LV IVS:        0.70 cm LV e' lateral:   5.11 cm/s                        LV E/e' lateral: 10.8  LEFT ATRIUM             Index LA diam:        3.50 cm 1.83 cm/m LA Vol (A2C):   42.4 ml 22.16 ml/m LA Vol (A4C):   16.6 ml 8.68 ml/m LA Biplane Vol: 26.7 ml 13.95 ml/m  AORTIC VALVE LVOT Vmax:   74.40 cm/s LVOT Vmean:  56.300 cm/s LVOT VTI:    0.134 m MITRAL VALVE MV Area (PHT): 3.00 cm    SHUNTS MV Decel Time: 253 msec    Systemic VTI: 0.13 m MV E velocity: 55.30 cm/s MV A velocity: 73.70 cm/s MV E/A ratio:  0.75 Kardie Tobb DO Electronically signed by Berniece Salines DO Signature Date/Time: 05/19/2021/2:02:05 PM  Final       Subjective: Patient denies any dyspnea, cough, she is eager to go home today.  Discharge Exam: Vitals:   05/21/21 0812 05/21/21 1159  BP: 105/68 108/61  Pulse: 65 60  Resp: 16 17  Temp: (!) 97.5 F (36.4 C) 98.1 F (36.7 C)  SpO2: 98% 97%   Vitals:   05/20/21 2325 05/21/21 0402 05/21/21 0812 05/21/21 1159  BP: 117/82 (!) 109/47 105/68 108/61  Pulse: 62 62 65 60  Resp: (!) 21 18 16 17   Temp: 98 F (36.7 C) 98.7 F (37.1 C) (!) 97.5 F (36.4 C) 98.1 F (36.7 C)  TempSrc:  Oral Oral Oral  SpO2: 93% 99% 98% 97%  Weight:      Height:        General: Pt is alert, awake, not in acute distress Cardiovascular: RRR, S1/S2 +, no rubs, no gallops Respiratory: CTA bilaterally, no wheezing, no rhonchi Abdominal: Soft, NT, ND, bowel sounds + Extremities: no edema, no cyanosis    The results of significant diagnostics from this hospitalization (including imaging, microbiology, ancillary and laboratory)  are listed below for reference.     Microbiology: Recent Results (from the past 240 hour(s))  Resp Panel by RT-PCR (Flu A&B, Covid) Nasopharyngeal Swab     Status: None   Collection Time: 05/15/21  9:11 PM   Specimen: Nasopharyngeal Swab; Nasopharyngeal(NP) swabs in vial transport medium  Result Value Ref Range Status   SARS Coronavirus 2 by RT PCR NEGATIVE NEGATIVE Final    Comment: (NOTE) SARS-CoV-2 target nucleic acids are NOT DETECTED.  The SARS-CoV-2 RNA is generally detectable in upper respiratory specimens during the acute phase of infection. The lowest concentration of SARS-CoV-2 viral copies this assay can detect is 138 copies/mL. A negative result does not preclude SARS-Cov-2 infection and should not be used as the sole basis for treatment or other patient management decisions. A negative result may occur with  improper specimen collection/handling, submission of specimen other than nasopharyngeal swab, presence of viral mutation(s) within the areas targeted by this assay, and inadequate number of viral copies(<138 copies/mL). A negative result must be combined with clinical observations, patient history, and epidemiological information. The expected result is Negative.  Fact Sheet for Patients:  EntrepreneurPulse.com.au  Fact Sheet for Healthcare Providers:  IncredibleEmployment.be  This test is no t yet approved or cleared by the [place] FDA and  has been authorized for detection and/or diagnosis of SARS-CoV-2 by FDA under an Emergency Use Authorization (EUA). This EUA will remain  in effect (meaning this test can be used) for the duration of the COVID-19 declaration under Section 564(b)(1) of the Act, 21 U.S.C.section 360bbb-3(b)(1), unless the authorization is terminated  or revoked sooner.       Influenza A by PCR NEGATIVE NEGATIVE Final   Influenza B by PCR NEGATIVE NEGATIVE Final    Comment: (NOTE) The Xpert Xpress  SARS-CoV-2/FLU/RSV plus assay is intended as an aid in the diagnosis of influenza from Nasopharyngeal swab specimens and should not be used as a sole basis for treatment. Nasal washings and aspirates are unacceptable for Xpert Xpress SARS-CoV-2/FLU/RSV testing.  Fact Sheet for Patients: EntrepreneurPulse.com.au  Fact Sheet for Healthcare Providers: IncredibleEmployment.be  This test is not yet approved or cleared by the [place] FDA and has been authorized for detection and/or diagnosis of SARS-CoV-2 by FDA under an Emergency Use Authorization (EUA). This EUA will remain in effect (meaning this test can be used) for the duration of the COVID-19  declaration under Section 564(b)(1) of the Act, 21 U.S.C. section 360bbb-3(b)(1), unless the authorization is terminated or revoked.  Performed at [facility], [address] [place], [place] [address]   Respiratory (~20 pathogens) panel by PCR     Status: Abnormal   Collection Time: 05/18/21  2:46 AM   Specimen: Nasopharyngeal Swab; Respiratory  Result Value Ref Range Status   Adenovirus NOT DETECTED NOT DETECTED Final   Coronavirus 229E NOT DETECTED NOT DETECTED Final    Comment: (NOTE) The Coronavirus on the Respiratory Panel, DOES NOT test for the novel  Coronavirus (2019 nCoV)    Coronavirus HKU1 NOT DETECTED NOT DETECTED Final   Coronavirus NL63 NOT DETECTED NOT DETECTED Final   Coronavirus OC43 NOT DETECTED NOT DETECTED Final   Metapneumovirus NOT DETECTED NOT DETECTED Final   Rhinovirus / Enterovirus NOT DETECTED NOT DETECTED Final   Influenza A NOT DETECTED NOT DETECTED Final   Influenza B NOT DETECTED NOT DETECTED Final   Parainfluenza Virus 1 NOT DETECTED NOT DETECTED Final   Parainfluenza Virus 2 NOT DETECTED NOT DETECTED Final   Parainfluenza Virus 3 NOT DETECTED NOT DETECTED Final   Parainfluenza Virus 4 NOT DETECTED NOT DETECTED Final   Respiratory Syncytial Virus  DETECTED (A) NOT DETECTED Final   Bordetella pertussis NOT DETECTED NOT DETECTED Final   Bordetella Parapertussis NOT DETECTED NOT DETECTED Final   Chlamydophila pneumoniae NOT DETECTED NOT DETECTED Final   Mycoplasma pneumoniae NOT DETECTED NOT DETECTED Final    Comment: Performed at [facility], [place] [address], [place], [place] [address]  Culture, blood (routine x 2)     Status: None (Preliminary result)   Collection Time: 05/18/21  2:56 AM   Specimen: BLOOD  Result Value Ref Range Status   Specimen Description BLOOD RIGHT ANTECUBITAL  Final   Special Requests   Final    BOTTLES DRAWN AEROBIC AND ANAEROBIC Blood Culture results may not be optimal due to an inadequate volume of blood received in culture bottles   Culture   Final    NO GROWTH 3 DAYS Performed at [facility], [place] [address], [place], [place] [address]    Report Status PENDING  Incomplete  Culture, blood (routine x 2)     Status: None (Preliminary result)   Collection Time: 05/18/21  3:21 AM   Specimen: BLOOD  Result Value Ref Range Status   Specimen Description BLOOD LEFT ANTECUBITAL  Final   Special Requests   Final    BOTTLES DRAWN AEROBIC ONLY Blood Culture results may not be optimal due to an inadequate volume of blood received in culture bottles   Culture   Final    NO GROWTH 3 DAYS Performed at [facility], [place] [address], [place], [place] [address]    Report Status PENDING  Incomplete  MRSA Next Gen by PCR, Nasal     Status: None   Collection Time: 05/18/21  3:40 AM  Result Value Ref Range Status   MRSA by PCR Next Gen NOT DETECTED NOT DETECTED Final    Comment: (NOTE) The GeneXpert MRSA Assay (FDA approved for NASAL specimens only), is one component of a comprehensive MRSA colonization surveillance program. It is not intended to diagnose MRSA infection nor to guide or monitor treatment for MRSA infections. Test performance is not FDA approved in patients less than 67  years old. Performed at [facility], [place] [address] [place], [place] [address]  Labs: BNP (last 3 results) Recent Labs    05/15/21 2110 05/18/21 0208  BNP 37.6 93.7   Basic Metabolic Panel: Recent Labs  Lab 05/16/21 0948 05/17/21 0153 05/18/21 0208 05/19/21 0126 05/20/21 0151 05/21/21 0115  NA 136 131* 135 136 134* 135  K 3.8 4.8 4.3 4.4 5.2* 4.7  CL 101 100 99 101 100 101  CO2 19* 22 24 24 24 26   GLUCOSE 219* 144* 108* 109* 141* 91  BUN 9 22* 21* 22* 23* 26*  CREATININE 1.25* 1.10* 1.14* 1.01* 1.00 0.94  CALCIUM 10.1 9.4 10.2 9.8 9.5 9.6  MG 2.3  --   --   --   --   --    Liver Function Tests: No results for input(s): AST, ALT, ALKPHOS, BILITOT, PROT, ALBUMIN in the last 168 hours. No results for input(s): LIPASE, AMYLASE in the last 168 hours. No results for input(s): AMMONIA in the last 168 hours. CBC: Recent Labs  Lab 05/15/21 2110 05/17/21 0153 05/18/21 0208 05/18/21 0319 05/19/21 0126 05/20/21 0151  WBC 6.2 17.8* 15.5* 14.3* 9.4 6.9  NEUTROABS 3.8  --  13.0*  --  7.1 5.1  HGB 13.5 12.6 15.5* 15.7* 13.7 13.1  HCT 39.6 38.1 47.1* 46.5* 41.0 40.2  MCV 87.8 89.6 90.4 89.3 89.3 90.7  PLT 287 303 341 292 273 262   Cardiac Enzymes: No results for input(s): CKTOTAL, CKMB, CKMBINDEX, TROPONINI in the last 168 hours. BNP: Invalid input(s): POCBNP CBG: Recent Labs  Lab 05/18/21 0345 05/18/21 0712 05/19/21 1621  GLUCAP 121* 136* 139*   D-Dimer No results for input(s): DDIMER in the last 72 hours. Hgb A1c No results for input(s): HGBA1C in the last 72 hours. Lipid Profile No results for input(s): CHOL, HDL, LDLCALC, TRIG, CHOLHDL, LDLDIRECT in the last 72 hours. Thyroid function studies No results for input(s): TSH, T4TOTAL, T3FREE, THYROIDAB in the last 72 hours.  Invalid input(s): FREET3 Anemia work up No results for input(s): VITAMINB12, FOLATE, FERRITIN, TIBC, IRON, RETICCTPCT in the last 72 hours. Urinalysis    Component Value  Date/Time   COLORURINE YELLOW 05/15/2020 1654   APPEARANCEUR CLEAR 05/15/2020 1654   LABSPEC 1.010 05/15/2020 1654   PHURINE 5.5 05/15/2020 1654   GLUCOSEU NEGATIVE 05/15/2020 1654   HGBUR NEGATIVE 05/15/2020 1654   BILIRUBINUR NEGATIVE 05/15/2020 1654   KETONESUR NEGATIVE 05/15/2020 1654   PROTEINUR NEGATIVE 05/15/2020 1654   NITRITE NEGATIVE 05/15/2020 1654   LEUKOCYTESUR TRACE (A) 05/15/2020 1654   Sepsis Labs Invalid input(s): PROCALCITONIN,  WBC,  LACTICIDVEN Microbiology Recent Results (from the past 240 hour(s))  Resp Panel by RT-PCR (Flu A&B, Covid) Nasopharyngeal Swab     Status: None   Collection Time: 05/15/21  9:11 PM   Specimen: Nasopharyngeal Swab; Nasopharyngeal(NP) swabs in vial transport medium  Result Value Ref Range Status   SARS Coronavirus 2 by RT PCR NEGATIVE NEGATIVE Final    Comment: (NOTE) SARS-CoV-2 target nucleic acids are NOT DETECTED.  The SARS-CoV-2 RNA is generally detectable in upper respiratory specimens during the acute phase of infection. The lowest concentration of SARS-CoV-2 viral copies this assay can detect is 138 copies/mL. A negative result does not preclude SARS-Cov-2 infection and should not be used as the sole basis for treatment or other patient management decisions. A negative result may occur with  improper specimen collection/handling, submission of specimen other than nasopharyngeal swab, presence of viral mutation(s) within the areas targeted by this assay, and inadequate number of viral copies(<138 copies/mL). A negative result must  be combined with clinical observations, patient history, and epidemiological information. The expected result is Negative.  Fact Sheet for Patients:  EntrepreneurPulse.com.au  Fact Sheet for Healthcare Providers:  IncredibleEmployment.be  This test is no t yet approved or cleared by the [place] FDA and  has been authorized for detection and/or diagnosis  of SARS-CoV-2 by FDA under an Emergency Use Authorization (EUA). This EUA will remain  in effect (meaning this test can be used) for the duration of the COVID-19 declaration under Section 564(b)(1) of the Act, 21 U.S.C.section 360bbb-3(b)(1), unless the authorization is terminated  or revoked sooner.       Influenza A by PCR NEGATIVE NEGATIVE Final   Influenza B by PCR NEGATIVE NEGATIVE Final    Comment: (NOTE) The Xpert Xpress SARS-CoV-2/FLU/RSV plus assay is intended as an aid in the diagnosis of influenza from Nasopharyngeal swab specimens and should not be used as a sole basis for treatment. Nasal washings and aspirates are unacceptable for Xpert Xpress SARS-CoV-2/FLU/RSV testing.  Fact Sheet for Patients: EntrepreneurPulse.com.au  Fact Sheet for Healthcare Providers: IncredibleEmployment.be  This test is not yet approved or cleared by the [place] FDA and has been authorized for detection and/or diagnosis of SARS-CoV-2 by FDA under an Emergency Use Authorization (EUA). This EUA will remain in effect (meaning this test can be used) for the duration of the COVID-19 declaration under Section 564(b)(1) of the Act, 21 U.S.C. section 360bbb-3(b)(1), unless the authorization is terminated or revoked.  Performed at [facility], [address] [place], [place] [address]   Respiratory (~20 pathogens) panel by PCR     Status: Abnormal   Collection Time: 05/18/21  2:46 AM   Specimen: Nasopharyngeal Swab; Respiratory  Result Value Ref Range Status   Adenovirus NOT DETECTED NOT DETECTED Final   Coronavirus 229E NOT DETECTED NOT DETECTED Final    Comment: (NOTE) The Coronavirus on the Respiratory Panel, DOES NOT test for the novel  Coronavirus (2019 nCoV)    Coronavirus HKU1 NOT DETECTED NOT DETECTED Final   Coronavirus NL63 NOT DETECTED NOT DETECTED Final   Coronavirus OC43 NOT DETECTED NOT DETECTED Final   Metapneumovirus  NOT DETECTED NOT DETECTED Final   Rhinovirus / Enterovirus NOT DETECTED NOT DETECTED Final   Influenza A NOT DETECTED NOT DETECTED Final   Influenza B NOT DETECTED NOT DETECTED Final   Parainfluenza Virus 1 NOT DETECTED NOT DETECTED Final   Parainfluenza Virus 2 NOT DETECTED NOT DETECTED Final   Parainfluenza Virus 3 NOT DETECTED NOT DETECTED Final   Parainfluenza Virus 4 NOT DETECTED NOT DETECTED Final   Respiratory Syncytial Virus DETECTED (A) NOT DETECTED Final   Bordetella pertussis NOT DETECTED NOT DETECTED Final   Bordetella Parapertussis NOT DETECTED NOT DETECTED Final   Chlamydophila pneumoniae NOT DETECTED NOT DETECTED Final   Mycoplasma pneumoniae NOT DETECTED NOT DETECTED Final    Comment: Performed at [facility], [place] [address], [place], [place] [address]  Culture, blood (routine x 2)     Status: None (Preliminary result)   Collection Time: 05/18/21  2:56 AM   Specimen: BLOOD  Result Value Ref Range Status   Specimen Description BLOOD RIGHT ANTECUBITAL  Final   Special Requests   Final    BOTTLES DRAWN AEROBIC AND ANAEROBIC Blood Culture results may not be optimal due to an inadequate volume of blood received in culture bottles   Culture   Final    NO GROWTH 3 DAYS Performed at [facility], [address], [place], [place] [address]    Report Status PENDING  Incomplete  Culture, blood (routine x 2)     Status: None (Preliminary result)   Collection Time: 05/18/21  3:21 AM   Specimen: BLOOD  Result Value Ref Range Status   Specimen Description BLOOD LEFT ANTECUBITAL  Final   Special Requests   Final    BOTTLES DRAWN AEROBIC ONLY Blood Culture results may not be optimal due to an inadequate volume of blood received in culture bottles   Culture   Final    NO GROWTH 3 DAYS Performed at [facility], [place] [address], [place], [place] [address]    Report Status PENDING  Incomplete  MRSA Next Gen by PCR, Nasal     Status: None   Collection Time:  05/18/21  3:40 AM  Result Value Ref Range Status   MRSA by PCR Next Gen NOT DETECTED NOT DETECTED Final    Comment: (NOTE) The GeneXpert MRSA Assay (FDA approved for NASAL specimens only), is one component of a comprehensive MRSA colonization surveillance program. It is not intended to diagnose MRSA infection nor to guide or monitor treatment for MRSA infections. Test performance is not FDA approved in patients less than 19 years old. Performed at [facility], [place] [address], [place], [place] [address]      Time coordinating discharge: Over 30 minutes  SIGNED:   Phillips Climes, MD  [facility] 05/21/2021, 2:17 PM Pager   If 7PM-7AM, please contact night-coverage [email or web address] Password [email or web address]

## 2021-05-21 NOTE — Progress Notes (Signed)
IV access and telemetry monitor removed without any issue noted. Patient provided with discharge education and materials. Verbalized understanding. Patient discharged from unit with all personal belongings, nebulizer machine, and portable oxygen tank/concentrator.

## 2021-05-21 NOTE — Progress Notes (Signed)
SATURATION QUALIFICATIONS: (This note is used to comply with regulatory documentation for home oxygen)  Patient Saturations on Room Air at Rest = 92%  Patient Saturations on Room Air while Ambulating = 87%  Patient Saturations on 2 Liters of oxygen while Ambulating = 94%  Please briefly explain why patient needs home oxygen: desat while ambulating on room air

## 2021-05-23 LAB — CULTURE, BLOOD (ROUTINE X 2)
Culture: NO GROWTH
Culture: NO GROWTH

## 2021-05-26 DIAGNOSIS — J441 Chronic obstructive pulmonary disease with (acute) exacerbation: Secondary | ICD-10-CM | POA: Diagnosis not present

## 2021-05-26 DIAGNOSIS — J9621 Acute and chronic respiratory failure with hypoxia: Secondary | ICD-10-CM | POA: Diagnosis not present

## 2021-05-27 DIAGNOSIS — I5032 Chronic diastolic (congestive) heart failure: Secondary | ICD-10-CM | POA: Diagnosis not present

## 2021-05-27 DIAGNOSIS — E1165 Type 2 diabetes mellitus with hyperglycemia: Secondary | ICD-10-CM | POA: Diagnosis not present

## 2021-05-27 DIAGNOSIS — E782 Mixed hyperlipidemia: Secondary | ICD-10-CM | POA: Diagnosis not present

## 2021-05-27 DIAGNOSIS — Z720 Tobacco use: Secondary | ICD-10-CM | POA: Diagnosis not present

## 2021-05-27 DIAGNOSIS — J3089 Other allergic rhinitis: Secondary | ICD-10-CM | POA: Diagnosis not present

## 2021-05-27 DIAGNOSIS — I2510 Atherosclerotic heart disease of native coronary artery without angina pectoris: Secondary | ICD-10-CM | POA: Diagnosis not present

## 2021-05-27 DIAGNOSIS — I872 Venous insufficiency (chronic) (peripheral): Secondary | ICD-10-CM | POA: Diagnosis not present

## 2021-05-27 DIAGNOSIS — I10 Essential (primary) hypertension: Secondary | ICD-10-CM | POA: Diagnosis not present

## 2021-05-27 DIAGNOSIS — E039 Hypothyroidism, unspecified: Secondary | ICD-10-CM | POA: Diagnosis not present

## 2021-05-27 DIAGNOSIS — J441 Chronic obstructive pulmonary disease with (acute) exacerbation: Secondary | ICD-10-CM | POA: Diagnosis not present

## 2021-06-01 DIAGNOSIS — I639 Cerebral infarction, unspecified: Secondary | ICD-10-CM

## 2021-06-01 HISTORY — DX: Cerebral infarction, unspecified: I63.9

## 2021-06-08 ENCOUNTER — Other Ambulatory Visit: Payer: Self-pay

## 2021-06-08 ENCOUNTER — Emergency Department (HOSPITAL_COMMUNITY): Payer: Medicare Other

## 2021-06-08 ENCOUNTER — Inpatient Hospital Stay (HOSPITAL_COMMUNITY)
Admission: EM | Admit: 2021-06-08 | Discharge: 2021-06-10 | DRG: 041 | Disposition: A | Discharge status: Home / Self Care | Payer: Medicare Other | Attending: Internal Medicine | Admitting: Internal Medicine

## 2021-06-08 ENCOUNTER — Encounter (HOSPITAL_COMMUNITY): Payer: Self-pay | Admitting: Internal Medicine

## 2021-06-08 ENCOUNTER — Observation Stay (HOSPITAL_BASED_OUTPATIENT_CLINIC_OR_DEPARTMENT_OTHER): Payer: Medicare Other

## 2021-06-08 DIAGNOSIS — Z8249 Family history of ischemic heart disease and other diseases of the circulatory system: Secondary | ICD-10-CM | POA: Diagnosis not present

## 2021-06-08 DIAGNOSIS — I110 Hypertensive heart disease with heart failure: Secondary | ICD-10-CM | POA: Diagnosis not present

## 2021-06-08 DIAGNOSIS — I672 Cerebral atherosclerosis: Secondary | ICD-10-CM | POA: Diagnosis not present

## 2021-06-08 DIAGNOSIS — I6300 Cerebral infarction due to thrombosis of unspecified precerebral artery: Secondary | ICD-10-CM

## 2021-06-08 DIAGNOSIS — R29818 Other symptoms and signs involving the nervous system: Secondary | ICD-10-CM | POA: Diagnosis not present

## 2021-06-08 DIAGNOSIS — I639 Cerebral infarction, unspecified: Secondary | ICD-10-CM | POA: Diagnosis not present

## 2021-06-08 DIAGNOSIS — J449 Chronic obstructive pulmonary disease, unspecified: Secondary | ICD-10-CM | POA: Diagnosis present

## 2021-06-08 DIAGNOSIS — J3489 Other specified disorders of nose and nasal sinuses: Secondary | ICD-10-CM | POA: Diagnosis not present

## 2021-06-08 DIAGNOSIS — I63512 Cerebral infarction due to unspecified occlusion or stenosis of left middle cerebral artery: Secondary | ICD-10-CM

## 2021-06-08 DIAGNOSIS — Z7982 Long term (current) use of aspirin: Secondary | ICD-10-CM

## 2021-06-08 DIAGNOSIS — Z823 Family history of stroke: Secondary | ICD-10-CM

## 2021-06-08 DIAGNOSIS — F17210 Nicotine dependence, cigarettes, uncomplicated: Secondary | ICD-10-CM | POA: Diagnosis not present

## 2021-06-08 DIAGNOSIS — I6389 Other cerebral infarction: Secondary | ICD-10-CM | POA: Diagnosis not present

## 2021-06-08 DIAGNOSIS — Z6830 Body mass index (BMI) 30.0-30.9, adult: Secondary | ICD-10-CM

## 2021-06-08 DIAGNOSIS — R4781 Slurred speech: Secondary | ICD-10-CM | POA: Diagnosis not present

## 2021-06-08 DIAGNOSIS — Z20822 Contact with and (suspected) exposure to covid-19: Secondary | ICD-10-CM | POA: Diagnosis not present

## 2021-06-08 DIAGNOSIS — Z8673 Personal history of transient ischemic attack (TIA), and cerebral infarction without residual deficits: Secondary | ICD-10-CM | POA: Diagnosis not present

## 2021-06-08 DIAGNOSIS — Z955 Presence of coronary angioplasty implant and graft: Secondary | ICD-10-CM

## 2021-06-08 DIAGNOSIS — I10 Essential (primary) hypertension: Secondary | ICD-10-CM | POA: Diagnosis not present

## 2021-06-08 DIAGNOSIS — I5042 Chronic combined systolic (congestive) and diastolic (congestive) heart failure: Secondary | ICD-10-CM | POA: Diagnosis present

## 2021-06-08 DIAGNOSIS — R29701 NIHSS score 1: Secondary | ICD-10-CM | POA: Diagnosis not present

## 2021-06-08 DIAGNOSIS — E890 Postprocedural hypothyroidism: Secondary | ICD-10-CM | POA: Diagnosis not present

## 2021-06-08 DIAGNOSIS — Z79890 Hormone replacement therapy (postmenopausal): Secondary | ICD-10-CM

## 2021-06-08 DIAGNOSIS — F32A Depression, unspecified: Secondary | ICD-10-CM | POA: Diagnosis not present

## 2021-06-08 DIAGNOSIS — I2510 Atherosclerotic heart disease of native coronary artery without angina pectoris: Secondary | ICD-10-CM | POA: Diagnosis not present

## 2021-06-08 DIAGNOSIS — I6340 Cerebral infarction due to embolism of unspecified cerebral artery: Principal | ICD-10-CM | POA: Diagnosis present

## 2021-06-08 DIAGNOSIS — G936 Cerebral edema: Secondary | ICD-10-CM | POA: Diagnosis not present

## 2021-06-08 DIAGNOSIS — I700 Atherosclerosis of aorta: Secondary | ICD-10-CM | POA: Diagnosis present

## 2021-06-08 DIAGNOSIS — I252 Old myocardial infarction: Secondary | ICD-10-CM | POA: Diagnosis not present

## 2021-06-08 DIAGNOSIS — E669 Obesity, unspecified: Secondary | ICD-10-CM | POA: Diagnosis present

## 2021-06-08 DIAGNOSIS — Z885 Allergy status to narcotic agent status: Secondary | ICD-10-CM

## 2021-06-08 DIAGNOSIS — I6523 Occlusion and stenosis of bilateral carotid arteries: Secondary | ICD-10-CM | POA: Diagnosis not present

## 2021-06-08 DIAGNOSIS — R9389 Abnormal findings on diagnostic imaging of other specified body structures: Secondary | ICD-10-CM | POA: Diagnosis not present

## 2021-06-08 DIAGNOSIS — I63412 Cerebral infarction due to embolism of left middle cerebral artery: Secondary | ICD-10-CM | POA: Diagnosis not present

## 2021-06-08 DIAGNOSIS — R4701 Aphasia: Secondary | ICD-10-CM | POA: Diagnosis present

## 2021-06-08 DIAGNOSIS — E7800 Pure hypercholesterolemia, unspecified: Secondary | ICD-10-CM | POA: Diagnosis present

## 2021-06-08 DIAGNOSIS — Z79899 Other long term (current) drug therapy: Secondary | ICD-10-CM

## 2021-06-08 DIAGNOSIS — F419 Anxiety disorder, unspecified: Secondary | ICD-10-CM | POA: Diagnosis present

## 2021-06-08 DIAGNOSIS — R519 Headache, unspecified: Secondary | ICD-10-CM | POA: Diagnosis not present

## 2021-06-08 LAB — COMPREHENSIVE METABOLIC PANEL
ALT: 30 U/L (ref 0–44)
AST: 20 U/L (ref 15–41)
Albumin: 3 g/dL — ABNORMAL LOW (ref 3.5–5.0)
Alkaline Phosphatase: 61 U/L (ref 38–126)
Anion gap: 11 (ref 5–15)
BUN: 18 mg/dL (ref 6–20)
CO2: 25 mmol/L (ref 22–32)
Calcium: 9.3 mg/dL (ref 8.9–10.3)
Chloride: 100 mmol/L (ref 98–111)
Creatinine, Ser: 1.3 mg/dL — ABNORMAL HIGH (ref 0.44–1.00)
GFR, Estimated: 47 mL/min — ABNORMAL LOW (ref >60)
Glucose, Bld: 97 mg/dL (ref 70–99)
Potassium: 4.2 mmol/L (ref 3.5–5.1)
Sodium: 136 mmol/L (ref 135–145)
Total Bilirubin: 0.9 mg/dL (ref 0.3–1.2)
Total Protein: 6.4 g/dL — ABNORMAL LOW (ref 6.5–8.1)

## 2021-06-08 LAB — ECHOCARDIOGRAM LIMITED
Height: 64 in
Weight: 2878.33 oz

## 2021-06-08 LAB — CBC
HCT: 38 % (ref 36.0–46.0)
Hemoglobin: 12.8 g/dL (ref 12.0–15.0)
MCH: 29.8 pg (ref 26.0–34.0)
MCHC: 33.7 g/dL (ref 30.0–36.0)
MCV: 88.6 fL (ref 80.0–100.0)
Platelets: 267 10*3/uL (ref 150–400)
RBC: 4.29 MIL/uL (ref 3.87–5.11)
RDW: 12.8 % (ref 11.5–15.5)
WBC: 5.7 10*3/uL (ref 4.0–10.5)
nRBC: 0 % (ref 0.0–0.2)

## 2021-06-08 LAB — I-STAT CHEM 8, ED
BUN: 18 mg/dL (ref 6–20)
Calcium, Ion: 1.15 mmol/L (ref 1.15–1.40)
Chloride: 101 mmol/L (ref 98–111)
Creatinine, Ser: 1.4 mg/dL — ABNORMAL HIGH (ref 0.44–1.00)
Glucose, Bld: 98 mg/dL (ref 70–99)
HCT: 37 % (ref 36.0–46.0)
Hemoglobin: 12.6 g/dL (ref 12.0–15.0)
Potassium: 4.1 mmol/L (ref 3.5–5.1)
Sodium: 139 mmol/L (ref 135–145)
TCO2: 26 mmol/L (ref 22–32)

## 2021-06-08 LAB — APTT: aPTT: 27 seconds (ref 24–36)

## 2021-06-08 LAB — DIFFERENTIAL
Abs Immature Granulocytes: 0.02 10*3/uL (ref 0.00–0.07)
Basophils Absolute: 0 10*3/uL (ref 0.0–0.1)
Basophils Relative: 1 %
Eosinophils Absolute: 0.1 10*3/uL (ref 0.0–0.5)
Eosinophils Relative: 1 %
Immature Granulocytes: 0 %
Lymphocytes Relative: 49 %
Lymphs Abs: 2.8 10*3/uL (ref 0.7–4.0)
Monocytes Absolute: 0.3 10*3/uL (ref 0.1–1.0)
Monocytes Relative: 6 %
Neutro Abs: 2.5 10*3/uL (ref 1.7–7.7)
Neutrophils Relative %: 43 %

## 2021-06-08 LAB — CBG MONITORING, ED: Glucose-Capillary: 104 mg/dL — ABNORMAL HIGH (ref 70–99)

## 2021-06-08 LAB — PROTIME-INR
INR: 1.1 (ref 0.8–1.2)
Prothrombin Time: 14.5 seconds (ref 11.4–15.2)

## 2021-06-08 LAB — RESP PANEL BY RT-PCR (FLU A&B, COVID) ARPGX2
Influenza A by PCR: NEGATIVE
Influenza B by PCR: NEGATIVE
SARS Coronavirus 2 by RT PCR: NEGATIVE

## 2021-06-08 LAB — SEDIMENTATION RATE: Sed Rate: 64 mm/hr — ABNORMAL HIGH (ref 0–22)

## 2021-06-08 LAB — HIV ANTIBODY (ROUTINE TESTING W REFLEX): HIV Screen 4th Generation wRfx: NONREACTIVE

## 2021-06-08 MED ORDER — IOHEXOL 350 MG/ML SOLN
100.0000 mL | Freq: Once | INTRAVENOUS | Status: AC | PRN
  Administered 2021-06-08: 100 mL via INTRAVENOUS

## 2021-06-08 MED ORDER — METOPROLOL SUCCINATE ER 25 MG PO TB24
12.5000 mg | ORAL_TABLET | Freq: Every day | ORAL | Status: DC
  Filled 2021-06-08 (×2): qty 1

## 2021-06-08 MED ORDER — SODIUM CHLORIDE 0.9 % IV SOLN: INTRAVENOUS | Status: DC

## 2021-06-08 MED ORDER — HYDROCODONE-ACETAMINOPHEN 5-325 MG PO TABS
1.0000 | ORAL_TABLET | Freq: Four times a day (QID) | ORAL | Status: DC | PRN
  Administered 2021-06-08: 2 via ORAL
  Administered 2021-06-09 (×3): 1 via ORAL
  Administered 2021-06-10: 2 via ORAL
  Filled 2021-06-08 (×2): qty 2
  Filled 2021-06-08 (×3): qty 1

## 2021-06-08 MED ORDER — ACETAMINOPHEN 325 MG PO TABS
650.0000 mg | ORAL_TABLET | ORAL | Status: DC | PRN
  Administered 2021-06-09 – 2021-06-10 (×3): 650 mg via ORAL
  Filled 2021-06-08 (×3): qty 2

## 2021-06-08 MED ORDER — ACETAMINOPHEN 650 MG RE SUPP: 650.0000 mg | RECTAL | Status: DC | PRN

## 2021-06-08 MED ORDER — ALPRAZOLAM 0.5 MG PO TABS
1.0000 mg | ORAL_TABLET | Freq: Three times a day (TID) | ORAL | Status: DC | PRN
  Administered 2021-06-08 – 2021-06-10 (×5): 1 mg via ORAL
  Filled 2021-06-08 (×5): qty 2

## 2021-06-08 MED ORDER — ASPIRIN 325 MG PO TABS
325.0000 mg | ORAL_TABLET | Freq: Every day | ORAL | Status: DC
  Administered 2021-06-08 – 2021-06-10 (×3): 325 mg via ORAL
  Filled 2021-06-08 (×5): qty 1

## 2021-06-08 MED ORDER — DOCUSATE SODIUM 100 MG PO CAPS: 100.0000 mg | ORAL_CAPSULE | Freq: Two times a day (BID) | ORAL | Status: DC | PRN

## 2021-06-08 MED ORDER — ENOXAPARIN SODIUM 40 MG/0.4ML IJ SOSY
40.0000 mg | PREFILLED_SYRINGE | INTRAMUSCULAR | Status: DC
  Administered 2021-06-08 – 2021-06-10 (×2): 40 mg via SUBCUTANEOUS
  Filled 2021-06-08 (×2): qty 0.4

## 2021-06-08 MED ORDER — SENNOSIDES-DOCUSATE SODIUM 8.6-50 MG PO TABS: 1.0000 | ORAL_TABLET | Freq: Every evening | ORAL | Status: DC | PRN

## 2021-06-08 MED ORDER — ROSUVASTATIN CALCIUM 20 MG PO TABS
20.0000 mg | ORAL_TABLET | Freq: Every day | ORAL | Status: DC
  Administered 2021-06-08 – 2021-06-10 (×3): 20 mg via ORAL
  Filled 2021-06-08 (×4): qty 1

## 2021-06-08 MED ORDER — SODIUM CHLORIDE 0.9% FLUSH
3.0000 mL | Freq: Once | INTRAVENOUS | Status: AC
  Administered 2021-06-08: 3 mL via INTRAVENOUS

## 2021-06-08 MED ORDER — ACETAMINOPHEN 160 MG/5ML PO SOLN: 650.0000 mg | ORAL | Status: DC | PRN

## 2021-06-08 MED ORDER — ALBUTEROL SULFATE (2.5 MG/3ML) 0.083% IN NEBU: 3.0000 mL | INHALATION_SOLUTION | RESPIRATORY_TRACT | Status: DC | PRN

## 2021-06-08 MED ORDER — LINACLOTIDE 145 MCG PO CAPS
145.0000 ug | ORAL_CAPSULE | Freq: Every day | ORAL | Status: DC
  Administered 2021-06-08 – 2021-06-10 (×3): 145 ug via ORAL
  Filled 2021-06-08 (×4): qty 1

## 2021-06-08 MED ORDER — ASPIRIN EC 81 MG PO TBEC: 81.0000 mg | DELAYED_RELEASE_TABLET | Freq: Every day | ORAL | Status: DC

## 2021-06-08 MED ORDER — SODIUM CHLORIDE 0.9 % IV BOLUS
500.0000 mL | Freq: Once | INTRAVENOUS | Status: AC
  Administered 2021-06-08: 500 mL via INTRAVENOUS

## 2021-06-08 MED ORDER — HYDRALAZINE HCL 25 MG PO TABS: 25.0000 mg | ORAL_TABLET | Freq: Four times a day (QID) | ORAL | Status: DC | PRN

## 2021-06-08 MED ORDER — LEVOTHYROXINE SODIUM 112 MCG PO TABS
112.0000 ug | ORAL_TABLET | Freq: Every morning | ORAL | Status: DC
Start: 2021-06-09 — End: 2021-06-10
  Administered 2021-06-09 – 2021-06-10 (×2): 112 ug via ORAL
  Filled 2021-06-08 (×3): qty 1

## 2021-06-08 MED ORDER — PERFLUTREN LIPID MICROSPHERE
1.0000 mL | INTRAVENOUS | Status: AC | PRN
  Administered 2021-06-08: 2 mL via INTRAVENOUS
  Filled 2021-06-08: qty 10

## 2021-06-08 MED ORDER — GABAPENTIN 300 MG PO CAPS: 300.0000 mg | ORAL_CAPSULE | Freq: Every day | ORAL | Status: DC | PRN

## 2021-06-08 MED ORDER — STROKE: EARLY STAGES OF RECOVERY BOOK
Freq: Once | Status: AC
  Filled 2021-06-08 (×2): qty 1

## 2021-06-08 MED ORDER — IPRATROPIUM-ALBUTEROL 0.5-2.5 (3) MG/3ML IN SOLN: 3.0000 mL | Freq: Four times a day (QID) | RESPIRATORY_TRACT | Status: DC | PRN

## 2021-06-08 MED ORDER — CYCLOBENZAPRINE HCL 10 MG PO TABS: 10.0000 mg | ORAL_TABLET | Freq: Three times a day (TID) | ORAL | Status: DC | PRN

## 2021-06-08 NOTE — ED Triage Notes (Signed)
Pt/daughter stated, she had a headache last night and this morning had speech hard to get out  what she wanted to say. Pt alert and oriented to place , date,  time,

## 2021-06-08 NOTE — ED Provider Notes (Addendum)
Emergency Medicine Provider Triage Evaluation Note  Jocelyn [name] , a 60 y.o. female  was evaluated in triage.  Pt is presenting with her daughter with complaints of aphasia and confusion.  She reports that last night her mother reported that she had a headache and this morning she woke up and was having difficulty finding her words and appeared confused.  History of CVA 7 years ago.  Possible history of arrhythmia per daughter.  Review of Systems  Positive: Confusion, aphasia, headache Negative: Chest pain, shortness of breath  Physical Exam  BP 112/69 (BP Location: Right Arm)   Pulse 65   Temp 98.4 F (36.9 C) (Oral)   Resp 17   SpO2 97%  Gen:   Awake, no distress   Resp:  Normal effort  MSK:   Moves extremities without difficulty  Other:  Alert and oriented x3.  No other focal signs.   Medical Decision Making  Medically screening exam initiated at 9:15 AM.  Appropriate orders placed.  Jocelyn [name] was informed that the remainder of the evaluation will be completed by another provider, this initial triage assessment does not replace that evaluation, and the importance of remaining in the ED until their evaluation is complete.   Code stroke not called, out of window. Low likelihood of LVO     Rhae Hammock, PA-C 06/08/21 0933    Lucrezia Starch, MD 06/08/21 1400

## 2021-06-08 NOTE — Consult Note (Addendum)
ASSESSMENT & PLAN   FOCAL ABNORMALITY OF THE LATERAL THORACIC AORTA: The patient has a subtle abnormality in the lateral thoracic arch just distal to the takeoff of the left subclavian artery.  This to me does not look like a penetrating ulcer.  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  She is having no chest pain and I do not think that her symptoms could be attributed to this.  I will arrange a follow-up CT scan of the chest in 6 months.  LEFT BRAIN STROKE: Her MRI shows small acute cortical infarcts in the left parietal and superior temporal lobe likely in the distal left MCA territory.  There is faint edema without mass-effect.  Her CT angio of the neck shows no significant carotid disease.  On the left side, the common carotid artery, external carotid, and internal carotid arteries are widely patent.  There is a calcific plaque in the distal common carotid artery but this does not produce any focal stenosis.  When she returns in 6 months for her follow-up CT scan of the chest I will also get a follow-up carotid duplex scan.  REASON FOR CONSULT:    Abnormal thoracic aorta on CT scan the consult is requested by Dr. Roosevelt Locks.  HPI:   Jocelyn [name] is a 60 y.o. female who presents to the emergency department with a chief complaint of slurred speech and a left-sided headache.  Her symptoms came on this morning when she awoke and her family noted that she had slurred speech.  He was also having a hard time understanding what others were saying.  She developed the gradual onset of some left sided headache also.  She denied any focal weakness or paresthesias in her upper or lower extremities.  In the emergency department a code stroke was called.  CT of the head was negative for acute findings.  Her MRI showed a small left cortical CVA in the parietal and superior temporal lobe likely in the distribution of the distal left MCA.  There was no significant disease in the extracranial carotid arteries  noted on CTA.  An incidental finding however was an abnormality in the lateral aortic arch possibly consistent with a nonacute penetrating ulcer versus a chronic focal dissection.  For this reason vascular surgery was consulted.  She denies any chest pain, chest pressure, or shortness of breath.    She denies any previous history of stroke although the records mention a TIA in 2013.  Past Medical History:  Diagnosis Date   Anxiety    Arthritis    Chronic diastolic CHF (congestive heart failure), NYHA class 2 (HCC)    COPD (chronic obstructive pulmonary disease) (HCC)    Depression    HTN (hypertension)    Hypercholesterolemia    Hypothyroidism (acquired)    Kidney stones 2015   Myocardial infarction ([place])    x3- pt unsure if MIs were in 1996 or 1999, had stent fixed in 2016 but no MI at that time per pt   Thyrotoxicosis    TIA (transient ischemic attack)    2013    Family History  Problem Relation Age of Onset   Heart disease Mother    Stroke Mother    Hypertension Mother    Heart disease Father    Cancer Father    Berenice Primas' disease Father    Colon cancer Father        pt unsure of age onset, believes he was in his 3s   Gout  Brother    Gout Brother    Cancer Brother    Stroke Brother    Diabetes Maternal Grandmother    Hypertension Maternal Grandmother    Heart attack Neg Hx    Esophageal cancer Neg Hx    Stomach cancer Neg Hx    Rectal cancer Neg Hx     SOCIAL HISTORY: Social History   Tobacco Use   Smoking status: Light Smoker    Packs/day: 0.50    Types: Cigarettes   Smokeless tobacco: Never  Substance Use Topics   Alcohol use: No    Alcohol/week: 0.0 standard drinks    Allergies  Allergen Reactions   Hydrocodone Nausea Only    Has problems with high doses    Current Facility-Administered Medications  Medication Dose Route Frequency Provider Last Rate Last Admin    stroke: mapping our early stages of recovery book   Does not apply Once Wynetta Fines  T, MD       acetaminophen (TYLENOL) tablet 650 mg  650 mg Oral Q4H PRN Lequita Halt, MD       Or   acetaminophen (TYLENOL) 160 MG/5ML solution 650 mg  650 mg Per Tube Q4H PRN Lequita Halt, MD       Or   acetaminophen (TYLENOL) suppository 650 mg  650 mg Rectal Q4H PRN Wynetta Fines T, MD       albuterol (PROVENTIL) (2.5 MG/3ML) 0.083% nebulizer solution 3 mL  3 mL Inhalation Q4H PRN Lequita Halt, MD       ALPRAZolam [name]) tablet 1 mg  1 mg Oral TID PRN Lequita Halt, MD       aspirin tablet 325 mg  325 mg Oral Daily Bhagat, Srishti L, MD       cyclobenzaprine (FLEXERIL) tablet 10 mg  10 mg Oral TID PRN Lequita Halt, MD       docusate sodium (COLACE) capsule 100 mg  100 mg Oral BID PRN Wynetta Fines T, MD       enoxaparin (LOVENOX) injection 40 mg  40 mg Subcutaneous Q24H Wynetta Fines T, MD       gabapentin (NEURONTIN) capsule 300 mg  300 mg Oral Daily PRN Wynetta Fines T, MD       hydrALAZINE (APRESOLINE) tablet 25 mg  25 mg Oral Q6H PRN Lequita Halt, MD       HYDROcodone-acetaminophen (NORCO/VICODIN) 5-325 MG per tablet 1-2 tablet  1-2 tablet Oral Q6H PRN Wynetta Fines T, MD       ipratropium-albuterol (DUONEB) 0.5-2.5 (3) MG/3ML nebulizer solution 3 mL  3 mL Nebulization Q6H PRN Lequita Halt, MD       [START ON 06/09/2021] levothyroxine (SYNTHROID) tablet 112 mcg  112 mcg Oral q morning Wynetta Fines T, MD       linaclotide [facility]) capsule 145 mcg  145 mcg Oral Daily Lequita Halt, MD       [START ON 06/09/2021] metoprolol succinate (TOPROL-XL) 24 hr tablet 12.5 mg  12.5 mg Oral Daily Wynetta Fines T, MD       rosuvastatin (CRESTOR) tablet 20 mg  20 mg Oral Daily Wynetta Fines T, MD       senna-docusate (Senokot-S) tablet 1 tablet  1 tablet Oral QHS PRN Lequita Halt, MD       Current Outpatient Medications  Medication Sig Dispense Refill   albuterol (PROVENTIL HFA;VENTOLIN HFA) 108 (90 Base) MCG/ACT inhaler Inhale 2 puffs into the lungs every 4 (four) hours  as needed for shortness of breath.       ALPRAZolam (XANAX) 1 MG tablet Take 1 tablet by mouth 3 (three) times daily as needed for anxiety.     aspirin EC 81 MG tablet Take 1 tablet (81 mg total) by mouth daily. Swallow whole. 90 tablet 3   cyclobenzaprine (FLEXERIL) 10 MG tablet Take 1 tablet (10 mg total) by mouth 3 (three) times daily as needed for muscle spasms. 21 tablet 0   docusate sodium (COLACE) 100 MG capsule Take 100 mg by mouth 2 (two) times daily as needed for constipation.     furosemide (LASIX) 40 MG tablet Take 40 mg by mouth daily.     gabapentin (NEURONTIN) 300 MG capsule Take 300 mg by mouth daily as needed (For pain).     HYDROcodone-acetaminophen (NORCO/VICODIN) 5-325 MG tablet Take 1-2 tablets by mouth every 6 (six) hours as needed for moderate pain. 15 tablet 0   ipratropium-albuterol (DUONEB) 0.5-2.5 (3) MG/3ML SOLN Take 3 mLs by nebulization every 6 (six) hours as needed. 360 mL 1   isosorbide mononitrate (IMDUR) 30 MG 24 hr tablet Take 2 tablets (60 mg total) by mouth daily. 30 tablet 5   levothyroxine (SYNTHROID) 112 MCG tablet TAKE 1 TABLET BY MOUTH EVERY MORNING 90 tablet 3   LINZESS 145 MCG CAPS capsule Take 145 mcg by mouth daily.     metoprolol succinate (TOPROL-XL) 25 MG 24 hr tablet Take 0.5 tablets (12.5 mg total) by mouth daily. 30 tablet 0   nitroGLYCERIN (NITROSTAT) 0.3 MG SL tablet Place 0.3 mg under the tongue every 5 (five) minutes as needed for chest pain.  0   predniSONE (STERAPRED UNI-PAK 21 TAB) 10 MG (21) TBPK tablet Use per package instructions 21 tablet 0   rosuvastatin (CRESTOR) 20 MG tablet Take 1 tablet (20 mg total) by mouth daily. (Patient not taking: Reported on 05/16/2021) 90 tablet 3   triamcinolone cream (KENALOG) 0.5 % Apply 1 application topically daily as needed (for sun exposure).     UNABLE TO FIND Take 2 tablets by mouth daily. Med Name: Garlic and Honey (homemade regimen) - two tablets a day with tsp of honey per patient      REVIEW OF SYSTEMS:  [X]  denotes positive  finding, [ ]  denotes negative finding Cardiac  Comments:  Chest pain or chest pressure:    Shortness of breath upon exertion:    Short of breath when lying flat:    Irregular heart rhythm:        Vascular    Pain in calf, thigh, or hip brought on by ambulation:    Pain in feet at night that wakes you up from your sleep:     Blood clot in your veins:    Leg swelling:         Pulmonary    Oxygen at home:    Productive cough:     Wheezing:         Neurologic    Sudden weakness in arms or legs:     Sudden numbness in arms or legs:     Sudden onset of difficulty speaking or slurred speech: x   Temporary loss of vision in one eye:     Problems with dizziness:         Gastrointestinal    Blood in stool:     Vomited blood:         Genitourinary    Burning when urinating:     Blood  in urine:        Psychiatric    Major depression:         Hematologic    Bleeding problems:    Problems with blood clotting too easily:        Skin    Rashes or ulcers:        Constitutional    Fever or chills:    -  PHYSICAL EXAM:   Vitals:   06/08/21 1330 06/08/21 1345 06/08/21 1400 06/08/21 1415  BP: 95/65 98/67 97/68  104/61  Pulse: 63 64 61 (!) 58  Resp: 18 18 16 16   Temp:      TempSrc:      SpO2: 100% 99% 99% 100%  Weight:      Height:       Body mass index is 30.88 kg/m. GENERAL: The patient is a well-nourished female, in no acute distress. The vital signs are documented above. CARDIAC: There is a regular rate and rhythm.  VASCULAR: I do not detect carotid bruits. She had a palpable left dorsalis pedis pulse.  I cannot palpate pulses on the right foot however both feet are warm and well-perfused. She has mild bilateral lower extremity swelling. PULMONARY: There is good air exchange bilaterally without wheezing or rales. ABDOMEN: Soft and non-tender with normal pitched bowel sounds.  MUSCULOSKELETAL: There are no major deformities. NEUROLOGIC: No focal weakness or  paresthesias are detected. SKIN: There are no ulcers or rashes noted. PSYCHIATRIC: The patient has a normal affect.  DATA:    CT ANGIOGRAM HEAD NECK: I reviewed the images of her CT angiogram.  She was noted to have possibly a nonacute penetrating ulcer versus a chronic focal dissection in the lateral arch that is fairly small.  The aorta is not aneurysmal.  There was some mild atherosclerotic plaque noted in the right carotid system but no significant stenosis.  Likewise there was some mild calcific plaque in the left carotid system but no focal stenosis.  MRI: Her MRI shows small acute cortical infarcts in the left parietal and superior temporal lobe likely in the distal left MCA territory.  There is faint edema without mass-effect noted.  Deitra Mayo [facility]

## 2021-06-08 NOTE — Code Documentation (Signed)
Pt is a 60 yr old with history of prior TIA, MI, and LV thrombus.Pt was  last known well last night at 2000. This morning at 0800, her daughter noticed that pt was having difficulty speaking. Pt presented to ED at 0906. Code stroke paged out at 1104. Pt returned to CT scan at 1105 for CTA. (CT done before Code initiation was neg for hemorrhage). CTA neg for LVO per Dr Curly Shores. Pt could potentially be eligible for thrombolytic under Wake Up protocol, so Stat MRI obtained. Per Dr Curly Shores, changes already present on Flair images, indicating pt not eligible for thrombolytic. Pt returned to ED room 10. Fluid bolus started. Stroke screen obtained. Pt lying flat as tolerated. Pt will need admission and continued stroke work up. She will need q 2 hr VS and neuro checks. Bedside handoff with [facility] RN complete.

## 2021-06-08 NOTE — Progress Notes (Signed)
500 ml fluid bolus started

## 2021-06-08 NOTE — Progress Notes (Signed)
  Echocardiogram 2D Echocardiogram with contrast has been performed.  Merrie Roof F 06/08/2021, 2:48 PM

## 2021-06-08 NOTE — Consult Note (Addendum)
Neurology Consultation Reason for Consult: Aphasia Requesting Physician: Madalyn Rob   CC: Difficulty speaking  History is obtained from: Patient, family at bedside and chart review  HPI: Jocelyn [name] is a 60 y.o. female with a past medical history significant for hypertension, hyperlipidemia, hypothyroidism, prior LV thrombus, coronary artery disease, COPD, prior smoking (quit 1 month ago), aortic atherosclerosis, anxiety/depression.  She has been in her usual state of health and family last spoke to her at 32 PM at which time she was speaking normally.  When her daughter called her at 8 AM she was noted to have speech that did not make sense.  She therefore presented to the ED for evaluation.  Initial head CT was negative.  Once neurology was consulted, evaluation was expedited and given patient could potentially meet wake-up stroke criteria code stroke was activated by myself.  At baseline the patient lives independently.  And chart review regarding her LV thrombus, it appears that that this was felt to be in the setting of smoking and estrogen use, with the patient questioning a role of COVID-19 vaccination that she had received shortly before she was diagnosed.  She had been on warfarin but had been eager to discontinue this medication and it was discontinued with cessation of her estrogen and smoking cessation.  LKW: 8 PM on 11/7, with symptom discovery at 8 AM on 11/8 tPA given?: No, FLAIR changes seen in area of restricted diffusion on MRI Premorbid modified rankin scale: 0     0 - No symptoms.  ROS: All other review of systems was negative except as noted in the HPI, and from family within limits of patient's aphasia  Past Medical History:  Diagnosis Date   Anxiety    Arthritis    Chronic diastolic CHF (congestive heart failure), NYHA class 2 (HCC)    COPD (chronic obstructive pulmonary disease) (HCC)    Depression    HTN (hypertension)    Hypercholesterolemia     Hypothyroidism (acquired)    Kidney stones 2015   Myocardial infarction ([place])    x3- pt unsure if MIs were in 1996 or 1999, had stent fixed in 2016 but no MI at that time per pt   Thyrotoxicosis    TIA (transient ischemic attack)    2013   Past Surgical History:  Procedure Laterality Date   BREAST CYST EXCISION     CARDIAC CATHETERIZATION N/A 08/24/2015   Procedure: Left Heart Cath and Coronary Angiography;  Surgeon: Jettie Booze, MD;  Location: [facility];  Service: Cardiovascular;  Laterality: N/A;   COLONOSCOPY     CORONARY STENT PLACEMENT  1999   and then had procedure to repair in 2016   KNEE SURGERY     partial hysterec     THYROIDECTOMY     No current facility-administered medications for this encounter.  Current Outpatient Medications:    albuterol (PROVENTIL HFA;VENTOLIN HFA) 108 (90 Base) MCG/ACT inhaler, Inhale 2 puffs into the lungs every 4 (four) hours as needed for shortness of breath. , Disp: , Rfl:    ALPRAZolam (XANAX) 1 MG tablet, Take 1 tablet by mouth 3 (three) times daily as needed for anxiety., Disp: , Rfl:    aspirin EC 81 MG tablet, Take 1 tablet (81 mg total) by mouth daily. Swallow whole., Disp: 90 tablet, Rfl: 3   cyclobenzaprine (FLEXERIL) 10 MG tablet, Take 1 tablet (10 mg total) by mouth 3 (three) times daily as needed for muscle spasms., Disp: 21 tablet,  Rfl: 0   docusate sodium (COLACE) 100 MG capsule, Take 100 mg by mouth 2 (two) times daily as needed for constipation., Disp: , Rfl:    furosemide (LASIX) 40 MG tablet, Take 40 mg by mouth daily., Disp: , Rfl:    gabapentin (NEURONTIN) 300 MG capsule, Take 300 mg by mouth daily as needed (For pain)., Disp: , Rfl:    HYDROcodone-acetaminophen (NORCO/VICODIN) 5-325 MG tablet, Take 1-2 tablets by mouth every 6 (six) hours as needed for moderate pain., Disp: 15 tablet, Rfl: 0   ipratropium-albuterol (DUONEB) 0.5-2.5 (3) MG/3ML SOLN, Take 3 mLs by nebulization every 6 (six) hours as needed.,  Disp: 360 mL, Rfl: 1   isosorbide mononitrate (IMDUR) 30 MG 24 hr tablet, Take 2 tablets (60 mg total) by mouth daily., Disp: 30 tablet, Rfl: 5   levothyroxine (SYNTHROID) 112 MCG tablet, TAKE 1 TABLET BY MOUTH EVERY MORNING, Disp: 90 tablet, Rfl: 3   LINZESS 145 MCG CAPS capsule, Take 145 mcg by mouth daily., Disp: , Rfl:    metoprolol succinate (TOPROL-XL) 25 MG 24 hr tablet, Take 0.5 tablets (12.5 mg total) by mouth daily., Disp: 30 tablet, Rfl: 0   nitroGLYCERIN (NITROSTAT) 0.3 MG SL tablet, Place 0.3 mg under the tongue every 5 (five) minutes as needed for chest pain., Disp: , Rfl: 0   predniSONE (STERAPRED UNI-PAK 21 TAB) 10 MG (21) TBPK tablet, Use per package instructions, Disp: 21 tablet, Rfl: 0   rosuvastatin (CRESTOR) 20 MG tablet, Take 1 tablet (20 mg total) by mouth daily. (Patient not taking: Reported on 05/16/2021), Disp: 90 tablet, Rfl: 3   triamcinolone cream (KENALOG) 0.5 %, Apply 1 application topically daily as needed (for sun exposure)., Disp: , Rfl:    UNABLE TO FIND, Take 2 tablets by mouth daily. Med Name: Garlic and Honey (homemade regimen) - two tablets a day with tsp of honey per patient, Disp: , Rfl:    Family History  Problem Relation Age of Onset   Heart disease Mother    Stroke Mother    Hypertension Mother    Heart disease Father    Cancer Father    Berenice Primas' disease Father    Colon cancer Father        pt unsure of age onset, believes he was in his 91s   Gout Brother    Gout Brother    Cancer Brother    Stroke Brother    Diabetes Maternal Grandmother    Hypertension Maternal Grandmother    Heart attack Neg Hx    Esophageal cancer Neg Hx    Stomach cancer Neg Hx    Rectal cancer Neg Hx    Past Surgical History:  Procedure Laterality Date   BREAST CYST EXCISION     CARDIAC CATHETERIZATION N/A 08/24/2015   Procedure: Left Heart Cath and Coronary Angiography;  Surgeon: Jettie Booze, MD;  Location: [facility];  Service: Cardiovascular;   Laterality: N/A;   COLONOSCOPY     CORONARY STENT PLACEMENT  1999   and then had procedure to repair in 2016   KNEE SURGERY     partial hysterec     THYROIDECTOMY     Social History:  reports that she has been smoking cigarettes. She has been smoking an average of .5 packs per day. She has never used smokeless tobacco. She reports that she does not drink alcohol and does not use drugs.  Per family she quit smoking about 1 month ago (October 2022)  Exam: Current vital signs: BP (!) 94/56   Pulse 63   Temp 98.4 F (36.9 C) (Oral)   Resp 16   Wt 81.6 kg   SpO2 100%   BMI 30.88 kg/m  Vital signs in last 24 hours: Temp:  [98.4 F (36.9 C)] 98.4 F (36.9 C) (11/08 0910) Pulse Rate:  [56-65] 63 (11/08 1221) Resp:  [16-18] 16 (11/08 1221) BP: (94-128)/(56-74) 94/56 (11/08 1221) SpO2:  [96 %-100 %] 100 % (11/08 1221) Weight:  [81.6 kg] 81.6 kg (11/08 1100)   Physical Exam  Constitutional: Appears well-developed and well-nourished.  Psych: Affect appropriate to situation, calm and cooperative, slightly frustrated at times due to her speech difficulties Eyes: No scleral injection HENT: No oropharyngeal obstruction.  MSK: no joint deformities.  Cardiovascular: Normal rate and regular rhythm.  Borderline hypotensive Respiratory: Effort normal, non-labored breathing GI: Soft.  No distension. There is no tenderness.  Skin: Warm dry and intact visible skin  Neuro: Mental Status: Patient is awake, alert, oriented to person, month, and age  Patient is able to give some history but has significant word finding difficulties and speech hesitancy.  She has difficulty with repetition and intermittently has difficulty following some commands, for example closing her eyes.  No evidence of neglect Cranial Nerves: II: Visual Fields are full.  III,IV, VI: EOMI without ptosis or diploplia.  V: Facial sensation is symmetric to light touch VII: Facial movement is symmetric.  VIII: hearing is  intact to voice X: Uvula elevates symmetrically XI: Shoulder shrug is symmetric. XII: tongue is midline without atrophy or fasciculations.  Motor: Tone is normal. Bulk is normal.  She does not have pronator drift of bilateral upper extremities and can maintain bilateral lower extremities antigravity Sensory: She is able to correctly report light touch on all 4 extremities and does not extinguish to double sided stimulus Deep Tendon Reflexes: 2+ and symmetric in the brachioradialis and patellae.  Cerebellar: FNF and HKS are intact bilaterally  NIHSS total 3 Score breakdown: One-point for not being able to close her eyes, one-point for mild to moderate aphasia, one-point for mild slurred speech per family    I have reviewed labs in epic and the results pertinent to this consultation are:  Basic Metabolic Panel: Recent Labs  Lab 06/08/21 0925 06/08/21 0937  NA 136 139  K 4.2 4.1  CL 100 101  CO2 25  --   GLUCOSE 97 98  BUN 18 18  CREATININE 1.30* 1.40*  CALCIUM 9.3  --     CBC: Recent Labs  Lab 06/08/21 0925 06/08/21 0937  WBC 5.7  --   NEUTROABS 2.5  --   HGB 12.8 12.6  HCT 38.0 37.0  MCV 88.6  --   PLT 267  --     Coagulation Studies: Recent Labs    06/08/21 0925  LABPROT 14.5  INR 1.1      I have reviewed the images obtained: Head CT without clear intracranial process CTA without clear LVO CT perfusion with an area of hypoperfusion in the left temporal/parietal lobe MRI brain with an area of restricted diffusion corresponding to the area of hypoperfusion with some early FLAIR changes on my read   CTA neck:  1. The common carotid and internal carotid arteries are patent within the neck without hemodynamically significant stenosis (50% or greater). Atherosclerotic plaque within the bilateral carotid systems within the neck, as described. 2. Streak and beam hardening artifact arising from a dense right-sided contrast bolus partially obscures  the V1  right vertebral artery. Within this limitation, the vertebral arteries are patent within the neck without appreciable stenosis. 3. Likely non-acute penetrating atherosclerotic ulceration and/or chronic focal dissection within the lateral aortic arch. The aortic arch is nonaneurysmal at this level. 4. Aortic Atherosclerosis (ICD10-I70.0) and Emphysema (ICD10-J43.9).   CTA head: 1. No intracranial large vessel occlusion or proximal high-grade arterial stenosis is identified. 2. Calcified plaque within the intracranial internal carotid arteries with no more than mild stenosis.   CT perfusion head: The perfusion software identifies no core infarct. The perfusion software identifies no hypoperfused parenchyma utilizing the Tmax>6 seconds threshold. However, the perfusion software identifies a 13 mL region of hypoperfusion within the posterior left MCA territory utilizing the Tmax >4.0 seconds threshold.  Radiology MRI impression: 1. Small acute cortical infarcts in the left parietal and superior temporal lobe, likely distal left MCA territory. Faint edema without mass effect.  2. Mild chronic microvascular disease.    Impression: Acute stroke  Recommendations: # Left temporoparietal stroke, concern for embolic etiology, cardioembolic versus atheroembolic - Stroke labs TSH, ESR, RPR, HgbA1c, fasting lipid panel - Frequent neuro checks - Echocardiogram, consider TEE to more definitively rule out intracardiac thrombus if TTE is negative depending on technical quality - Prophylactic therapy-Antiplatelet med: Aspirin - dose 369m daily  - If no indication for anticoagulation found, consider Plavix 300 mg load with 75 mg daily for 21 - 90 day course  - Risk factor modification - Telemetry monitoring; 30 day event monitor on discharge if no arrythmias captured  - Blood pressure goal   - Permissive hypertension to 220/120 for 24 hours  - Giving 500 cc bolus given patient is borderline  hypotensive at this time, appreciate medicine assistance given her significant cardiac history to avoid volume overload while still supporting her blood pressure - PT/OT/Speech consult - Stroke team to follow  [phone] 7 AM to 7 PM, outside these hours please contact Neurologist on call listed on AMION    Total critical care time: 70 minutes   Critical care time was exclusive of separately billable procedures and treating other patients.   Critical care was necessary to treat or prevent imminent or life-threatening deterioration.   Critical care was time spent personally by me on the following activities: development of treatment plan with patient and/or surrogate as well as nursing, discussions with consultants/primary team, evaluation of patient's response to treatment, examination of patient, obtaining history from patient or surrogate, ordering and performing treatments and interventions, ordering and review of laboratory studies, ordering and review of radiographic studies, and re-evaluation of patient's condition as needed, as documented above.

## 2021-06-08 NOTE — ED Notes (Signed)
Called [facility] to Activated code stroke

## 2021-06-08 NOTE — ED Provider Notes (Signed)
[facility] EMERGENCY DEPARTMENT Provider Note   CSN: [id number] Arrival date & time: 06/08/21  0856     History Chief Complaint  Patient presents with   Aphasia   Headache    Davetta DEAUNA YAW is a 60 y.o. female with past medical history significant for CVA, MI, CHF, HLD, HTN, COPD presents to the ED with less than 1 day of aphasia, confusion, headache.   Per patient she had a headache last night and attempted to take an analgesic without resolution.  She states she woke up this morning and noticed that she just did not feel right  Per daughter recent hospitalization 2 weeks prior for COPD home O2 2 L intermittent use.  Last known normal Monday evening.  Was called this morning stated her words did not sound right.  Brought her to the ED.  Denies prior history of issues with speech.  Endorses medication changes addition of albuterol and Pulmicort.  Denies use of assistive devices such as wheelchair, walker, cane.  The history is provided by the patient and a relative. The history is limited by the condition of the patient.  Headache Pain location:  L parietal and L temporal Quality:  Dull Onset quality:  Gradual Duration:  1 day Associated symptoms: no abdominal pain, no congestion, no cough, no fever and no myalgias       Past Medical History:  Diagnosis Date   Anxiety    Arthritis    Chronic diastolic CHF (congestive heart failure), NYHA class 2 (HCC)    COPD (chronic obstructive pulmonary disease) (HCC)    Depression    HTN (hypertension)    Hypercholesterolemia    Hypothyroidism (acquired)    Kidney stones 2015   Myocardial infarction ([place])    x3- pt unsure if MIs were in 1996 or 1999, had stent fixed in 2016 but no MI at that time per pt   Thyrotoxicosis    TIA (transient ischemic attack)    2013    Patient Active Problem List   Diagnosis Date Noted   COPD exacerbation ([place]) 05/16/2021   Acute on chronic respiratory failure with hypoxia  ([place]) 05/16/2021   Hypokalemia 05/16/2021   AKI (acute kidney injury) ([place]) 05/16/2021   LV (left ventricular) mural thrombus 05/25/2020   Renal infarct ([place]) 05/16/2020   Hypoxia 05/16/2020   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   Proptosis 12/27/2019   Multinodular goiter 03/27/2019   Hypothyroidism 09/26/2018   Sacroiliitis, not elsewhere classified ([place]) 04/24/2017   Chronic right-sided low back pain with right-sided sciatica 01/12/2017   Chronic bilateral thoracic back pain 01/12/2017   Other spondylosis with radiculopathy, lumbar region 10/19/2016   Depression with anxiety 07/16/2016   Chronic diastolic CHF (congestive heart failure) ([place]) 07/16/2016   Aortic atherosclerosis ([place]) 11/24/2015   Unstable angina ([place]) 08/21/2015   Chest pain    Coronary artery disease    CAD (coronary artery disease), native coronary artery 01/14/2015   Hypertension 01/14/2015   Hypercholesterolemia 01/14/2015   Tobacco abuse 01/14/2015    Past Surgical History:  Procedure Laterality Date   BREAST CYST EXCISION     CARDIAC CATHETERIZATION N/A 08/24/2015   Procedure: Left Heart Cath and Coronary Angiography;  Surgeon: Jettie Booze, MD;  Location: [facility];  Service: Cardiovascular;  Laterality: N/A;   COLONOSCOPY     CORONARY STENT PLACEMENT  1999   and then had procedure to repair in 2016   KNEE SURGERY     partial hysterec  THYROIDECTOMY       OB History   No obstetric history on file.     Family History  Problem Relation Age of Onset   Heart disease Mother    Stroke Mother    Hypertension Mother    Heart disease Father    Cancer Father    Berenice Primas' disease Father    Colon cancer Father        pt unsure of age onset, believes he was in his 57s   Gout Brother    Gout Brother    Cancer Brother    Stroke Brother    Diabetes Maternal Grandmother    Hypertension Maternal Grandmother    Heart attack Neg Hx    Esophageal cancer Neg Hx    Stomach cancer Neg Hx     Rectal cancer Neg Hx     Social History   Tobacco Use   Smoking status: Light Smoker    Packs/day: 0.50    Types: Cigarettes   Smokeless tobacco: Never  Vaping Use   Vaping Use: Never used  Substance Use Topics   Alcohol use: No    Alcohol/week: 0.0 standard drinks   Drug use: No    Home Medications Prior to Admission medications   Medication Sig Start Date End Date Taking? Authorizing Provider  albuterol (PROVENTIL HFA;VENTOLIN HFA) 108 (90 Base) MCG/ACT inhaler Inhale 2 puffs into the lungs every 4 (four) hours as needed for shortness of breath.  06/21/15 05/16/21  [provider]  ALPRAZolam [name]) 1 MG tablet Take 1 tablet by mouth 3 (three) times daily as needed for anxiety. 05/10/20   [provider]  aspirin EC 81 MG tablet Take 1 tablet (81 mg total) by mouth daily. Swallow whole. 09/02/20   Jettie Booze, MD  cyclobenzaprine (FLEXERIL) 10 MG tablet Take 1 tablet (10 mg total) by mouth 3 (three) times daily as needed for muscle spasms. 03/29/17   [name], NP  docusate sodium (COLACE) 100 MG capsule Take 100 mg by mouth 2 (two) times daily as needed for constipation. 12/19/19   [provider]  furosemide (LASIX) 40 MG tablet Take 40 mg by mouth daily.    [provider]  gabapentin (NEURONTIN) 300 MG capsule Take 300 mg by mouth daily as needed (For pain). 03/11/20   [provider]  HYDROcodone-acetaminophen (NORCO/VICODIN) 5-325 MG tablet Take 1-2 tablets by mouth every 6 (six) hours as needed for moderate pain. 05/21/20   Shelly Coss, MD  ipratropium-albuterol (DUONEB) 0.5-2.5 (3) MG/3ML SOLN Take 3 mLs by nebulization every 6 (six) hours as needed. 05/21/21   [name], [name], MD  isosorbide mononitrate (IMDUR) 30 MG 24 hr tablet Take 2 tablets (60 mg total) by mouth daily. 07/17/16   [name], MD  levothyroxine (SYNTHROID) 112 MCG tablet TAKE 1 TABLET BY MOUTH EVERY MORNING 12/06/20   [name], MD   LINZESS 145 MCG CAPS capsule Take 145 mcg by mouth daily. 04/30/20   [provider]  metoprolol succinate (TOPROL-XL) 25 MG 24 hr tablet Take 0.5 tablets (12.5 mg total) by mouth daily. 05/21/21   [name], [name], MD  nitroGLYCERIN (NITROSTAT) 0.3 MG SL tablet Place 0.3 mg under the tongue every 5 (five) minutes as needed for chest pain. 10/28/15   [provider]  predniSONE (STERAPRED UNI-PAK 21 TAB) 10 MG (21) TBPK tablet Use per package instructions 05/21/21   [name], [name], MD  rosuvastatin (CRESTOR) 20 MG tablet Take 1 tablet (  20 mg total) by mouth daily. Patient not taking: Reported on 05/16/2021 07/20/20   Jettie Booze, MD  triamcinolone cream (KENALOG) 0.5 % Apply 1 application topically daily as needed (for sun exposure).    [provider]  UNABLE TO FIND Take 2 tablets by mouth daily. Med Name: Garlic and Honey (homemade regimen) - two tablets a day with tsp of honey per patient    [provider]    Allergies    Hydrocodone  Review of Systems   Review of Systems  Constitutional:  Positive for activity change. Negative for fever.  HENT:  Negative for congestion.   Eyes:  Negative for visual disturbance.  Respiratory:  Negative for cough.   Cardiovascular:  Negative for chest pain.  Gastrointestinal:  Negative for abdominal pain.  Endocrine: Negative for polydipsia and polyuria.  Genitourinary:  Positive for decreased urine volume. Negative for difficulty urinating.  Musculoskeletal:  Negative for arthralgias and myalgias.  Neurological:  Positive for speech difficulty and headaches.  Psychiatric/Behavioral:  Positive for confusion.    Physical Exam Updated Vital Signs BP 112/69 (BP Location: Right Arm)   Pulse 65   Temp 98.4 F (36.9 C) (Oral)   Resp 17   SpO2 97%   Physical Exam Vitals and nursing note reviewed.  Constitutional:      General: She is not in acute distress.    Appearance: She is well-developed.  She is not ill-appearing, toxic-appearing or diaphoretic.  HENT:     Head: Normocephalic.     Mouth/Throat:     Mouth: Mucous membranes are moist.     Pharynx: Oropharynx is clear.  Eyes:     General: No scleral icterus.    Extraocular Movements: Extraocular movements intact.     Right eye: Normal extraocular motion.     Left eye: Normal extraocular motion.     Pupils: Pupils are equal, round, and reactive to light. Pupils are equal.     Right eye: Pupil is round and reactive.     Left eye: Pupil is round and reactive.  Cardiovascular:     Rate and Rhythm: Normal rate and regular rhythm.     Heart sounds: Normal heart sounds. No murmur heard. Pulmonary:     Effort: Pulmonary effort is normal. No respiratory distress.     Breath sounds: Normal breath sounds. No wheezing or rhonchi.  Abdominal:     General: Bowel sounds are normal. There is no distension.     Palpations: Abdomen is soft.     Tenderness: There is no abdominal tenderness. There is no guarding.  Musculoskeletal:        General: No swelling or tenderness.     Cervical back: Neck supple. No rigidity.  Neurological:     Mental Status: She is alert.     Cranial Nerves: No dysarthria or facial asymmetry.     Sensory: No sensory deficit.     Motor: No weakness.     Comments: Word finding is apparent. Alert and oriented to person and place but could not initially state the city she lived in.   Psychiatric:        Mood and Affect: Mood normal.        Behavior: Behavior normal.    ED Results / Procedures / Treatments   Labs (all labs ordered are listed, but only abnormal results are displayed) Labs Reviewed  COMPREHENSIVE METABOLIC PANEL - Abnormal; Notable for the following components:      Result Value  Creatinine, Ser 1.30 (*)    Total Protein 6.4 (*)    Albumin 3.0 (*)    GFR, Estimated 47 (*)    All other components within normal limits  I-STAT CHEM 8, ED - Abnormal; Notable for the following components:    Creatinine, Ser 1.40 (*)    All other components within normal limits  CBG MONITORING, ED - Abnormal; Notable for the following components:   Glucose-Capillary 104 (*)    All other components within normal limits  PROTIME-INR  APTT  CBC  DIFFERENTIAL  SEDIMENTATION RATE    EKG None  Radiology CT HEAD WO CONTRAST  Result Date: 06/08/2021 CLINICAL DATA:  Neuro deficit, acute, stroke suspected EXAM: CT HEAD WITHOUT CONTRAST TECHNIQUE: Contiguous axial images were obtained from the base of the skull through the vertex without intravenous contrast. COMPARISON:  08/20/2016. FINDINGS: Brain: No evidence of acute infarction, hemorrhage, hydrocephalus, extra-axial collection or mass lesion/mass effect. Vascular: No hyperdense vessel identified. Calcific intracranial atherosclerosis. Skull: No evidence of acute fracture. Sinuses/Orbits: Mild paranasal sinus mucosal thickening. Unremarkable orbits. Other: No mastoid effusions. IMPRESSION: No evidence of acute intracranial abnormality. Electronically Signed   By: Margaretha Sheffield M.D.   On: 06/08/2021 10:11   MR BRAIN WO CONTRAST  Result Date: 06/08/2021 CLINICAL DATA:  Neuro deficit, acute, stroke suspected EXAM: MRI HEAD WITHOUT CONTRAST TECHNIQUE: Multiplanar, multiecho pulse sequences of the brain and surrounding structures were obtained without intravenous contrast. COMPARISON:  Same day CT/CTA. FINDINGS: Brain: Small acute cortical infarcts in the left parietal and superior temporal lobe, likely distal left MCA territory. Faint edema without mass effect. No midline shift. Basal cisterns are patent. Otherwise, mild scattered T2 hyperintensities in the white matter, nonspecific but compatible with chronic microvascular ischemic disease. No hydrocephalus, mass lesion, extra-axial fluid collection, or acute hemorrhage. Vascular: Better evaluated on concurrent CTA. Skull and upper cervical spine: Heterogeneous marrow without focal suspicious lesion.  Sinuses/Orbits: Minimal paranasal sinus mucosal thickening. Unremarkable orbits. Other: No sizable mastoid effusions. IMPRESSION: 1. Small acute cortical infarcts in the left parietal and superior temporal lobe, likely distal left MCA territory. Faint edema without mass effect. 2. Mild chronic microvascular disease. Electronically Signed   By: Margaretha Sheffield M.D.   On: 06/08/2021 12:20   CT ANGIO HEAD NECK W WO CM W PERF (CODE STROKE)  Result Date: 06/08/2021 CLINICAL DATA:  Neuro deficit, acute, stroke suspected. Additional history provided: Aphasia. EXAM: CT ANGIOGRAPHY HEAD AND NECK CT PERFUSION BRAIN TECHNIQUE: Multidetector CT imaging of the head and neck was performed using the standard protocol during bolus administration of intravenous contrast. Multiplanar CT image reconstructions and MIPs were obtained to evaluate the vascular anatomy. Carotid stenosis measurements (when applicable) are obtained utilizing NASCET criteria, using the distal internal carotid diameter as the denominator. Multiphase CT imaging of the brain was performed following IV bolus contrast injection. Subsequent parametric perfusion maps were calculated using RAPID software. CONTRAST:  175mL OMNIPAQUE IOHEXOL 350 MG/ML SOLN COMPARISON:  Noncontrast head CT performed earlier today 06/08/2021. FINDINGS: CTA NECK FINDINGS Aortic arch: Standard aortic branching. Soft and calcified plaque within the visualized aortic arch and proximal major branch vessels of the neck. Probable non-acute penetrating atherosclerotic ulceration and/or chronic focal dissection of the lateral arch (for instance as seen on series 7, images 304 and 305) (series 10, image 168). The arch is nonaneurysmal at this level, measuring 2.5 cm in diameter. No hemodynamically significant innominate or proximal subclavian artery stenosis. Right carotid system: CCA and ICA patent within the neck without  significant stenosis (50% or greater). Mild atherosclerotic plaque  within the distal CCA, carotid bifurcation and proximal ICA. Left carotid system: CCA and ICA patent within the neck without significant stenosis (50% or greater). Mild to moderate predominantly calcified plaque within the distal CCA, carotid bifurcation and proximal ICA. Vertebral arteries: Streak and beam hardening artifact arising from a dense right-sided contrast bolus partially obscures the V1 right vertebral artery. Within this limitation, the vertebral arteries are patent within the neck without appreciable stenosis. Minimal nonstenotic calcified plaque within the distal cervical left vertebral artery. Skeleton: Cervical spondylosis. No acute bony abnormality or aggressive osseous lesion partially imaged thoracic levocurvature. Other neck: 11 mm right thyroid lobe nodule. The left thyroid lobe is poorly delineated, markedly atrophic or surgically absent. Upper chest: The centrilobular and paraseptal emphysema. No consolidation within the imaged lung apices. Review of the MIP images confirms the above findings CTA HEAD FINDINGS Anterior circulation: The intracranial internal carotid arteries are patent. Mild calcified plaque within both vessels without stenosis. The M1 middle cerebral arteries are patent. No M2 proximal branch occlusion or high-grade proximal stenosis is identified. The anterior cerebral arteries are patent. No intracranial aneurysm is identified. Posterior circulation: The intracranial vertebral arteries are patent. The basilar artery is patent. The posterior cerebral arteries are patent. The posterior cerebral arteries are fetal in origin bilaterally. Venous sinuses: Within the limitations of contrast timing, no convincing thrombus. Anatomic variants: As described. Review of the MIP images confirms the above findings CT Brain Perfusion Findings: CBF (<30%) Volume: 69mL Perfusion (Tmax>6.0s) volume: 18mL (However, the perfusion software identifies a 13 mL region of hypoperfusion within the  posterior left MCA territory utilizing the Tmax >4.0 seconds threshold). Mismatch Volume: 28mL Infarction Location:None identified IMPRESSION: CTA neck: 1. The common carotid and internal carotid arteries are patent within the neck without hemodynamically significant stenosis (50% or greater). Atherosclerotic plaque within the bilateral carotid systems within the neck, as described. 2. Streak and beam hardening artifact arising from a dense right-sided contrast bolus partially obscures the V1 right vertebral artery. Within this limitation, the vertebral arteries are patent within the neck without appreciable stenosis. 3. Likely non-acute penetrating atherosclerotic ulceration and/or chronic focal dissection within the lateral aortic arch. The aortic arch is nonaneurysmal at this level. 4. Aortic Atherosclerosis (ICD10-I70.0) and Emphysema (ICD10-J43.9). CTA head: 1. No intracranial large vessel occlusion or proximal high-grade arterial stenosis is identified. 2. Calcified plaque within the intracranial internal carotid arteries with no more than mild stenosis. CT perfusion head: The perfusion software identifies no core infarct. The perfusion software identifies no hypoperfused parenchyma utilizing the Tmax>6 seconds threshold. However, the perfusion software identifies a 13 mL region of hypoperfusion within the posterior left MCA territory utilizing the Tmax >4.0 seconds threshold. Electronically Signed   By: Kellie Simmering D.O.   On: 06/08/2021 12:23    Procedures Procedures   Medications Ordered in ED Medications  sodium chloride flush (NS) 0.9 % injection 3 mL (has no administration in time range)    ED Course  I have reviewed the triage vital signs and the nursing notes.  Pertinent labs & imaging results that were available during my care of the patient were reviewed by me and considered in my medical decision making (see chart for details).   Clinical Course as of 06/08/21 1259  Tue Jun 08, 2021   1001 Creatinine(!): 1.40 [SA]  1016 Glucose: 97 [SA]  1035 Discussed with Baghat, wants CTA and CT Perfusion studies but agrees outside of window for  stroke alert, she will come see patient shortly [RD]    Clinical Course User Index [RD] Lucrezia Starch, MD [SA] Gerlene Fee, DO   MDM Rules/Calculators/A&P                          60 year old female with past medical for CVA, HLD, MI, COPD, and history of tobacco use presenting to ED with speech changes and headache.  Last known normal was 8 or 9 PM last night when daughter spoke with her she is now having increased difficulty with word finding as well as some mild confusion and continues to endorse left-sided headache.  Of note most recent ED admission for COPD exacerbation she is on 2 L baseline at home intermittently as needed.  Here today vital signs are normal creatinine with slight elevation of 1.4 otherwise glucose is 97.  On exam she has significantly noticeable word finding as well as mild confusion of not initially knowing which city she lives in but this was later stated correctly.  CT head without acute abnormalities.  Upon receipt of this image patient was discussed with neurology.  At that time neurology wanted to obtain CTA and CT perfusion scans but agreed this was outside the window for stroke alert.  CTA head and neck with non-acute atherosclerotic ulceration and/or chronic focal dissection w/ in lateral aortic arch. MRI w/ small acute cortical infarcts in L parietal and superior temporal lobe. Consulted triad hospitalist for admission. Went to bedside to update family, daughter is at bedside, no questions at this time. Patient showing some improvement with word finding.     Final Clinical Impression(s) / ED Diagnoses Final diagnoses:  Cerebral infarction, unspecified mechanism [facility])    Rx / DC Orders ED Discharge Orders     None        Gerlene Fee, DO 06/08/21 1350    Lucrezia Starch,  MD 06/09/21 [phone]

## 2021-06-08 NOTE — ED Notes (Signed)
Patient transported to CT 

## 2021-06-08 NOTE — H&P (Signed)
History and Physical    Jocelyn [name]:[id number] DOB: 13-Oct-1960 DOA: 06/08/2021  PCP: Benito Mccreedy, MD (Confirm with patient/family/NH records and if not entered, this has to be entered at [facility] point of entry) Patient coming from: [facility]  I have personally briefly reviewed patient's old medical records in [place]  Chief Complaint: Speech problems  HPI: Jocelyn [name] is a 60 y.o. 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  The patient also had trouble to understand family's talking.  Patient also developed left-sided headache this morning, dull like, constant.  Denies any numbness or weakness of any of the limbs, no blurry vision.  ED Course: Code stroke was called, CT head negative for acute findings, MRI showed small left cortical CVA parietal and superior temporal lobe likely distal left MCA territory.  CT angiogram no stenosis in common carotic or ICA, streak and being hardening right-sided vertebral artery.  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  Review of Systems: As per HPI otherwise 14 point review of systems negative.    Past Medical History:  Diagnosis Date   Anxiety    Arthritis    Chronic diastolic CHF (congestive heart failure), NYHA class 2 (HCC)    COPD (chronic obstructive pulmonary disease) (HCC)    Depression    HTN (hypertension)    Hypercholesterolemia    Hypothyroidism (acquired)    Kidney stones 2015   Myocardial infarction [facility])    x3- pt unsure if MIs were  in 1996 or 1999, had stent fixed in 2016 but no MI at that time per pt   Thyrotoxicosis    TIA (transient ischemic attack)    2013    Past Surgical History:  Procedure Laterality Date   BREAST CYST EXCISION     CARDIAC CATHETERIZATION N/A 08/24/2015   Procedure: Left Heart Cath and Coronary Angiography;  Surgeon: Jettie Booze, MD;  Location: [facility];  Service: Cardiovascular;  Laterality: N/A;   COLONOSCOPY     CORONARY STENT PLACEMENT  1999   and then had procedure to repair in 2016   KNEE SURGERY     partial hysterec     THYROIDECTOMY       reports that she has been smoking cigarettes. She has been smoking an average of .5 packs per day. She has never used smokeless tobacco. She reports that she does not drink alcohol and does not use drugs.  Allergies  Allergen Reactions   Hydrocodone Nausea Only    Has problems with high doses    Family History  Problem Relation Age of Onset   Heart disease Mother    Stroke Mother    Hypertension Mother    Heart disease Father    Cancer Father    Berenice Primas' disease Father    Colon cancer Father        pt unsure of age onset, believes he was in his 46s   Gout Brother    Gout Brother    Cancer Brother    Stroke Brother    Diabetes Maternal Grandmother  Hypertension Maternal Grandmother    Heart attack Neg Hx    Esophageal cancer Neg Hx    Stomach cancer Neg Hx    Rectal cancer Neg Hx      Prior to Admission medications   Medication Sig Start Date End Date Taking? Authorizing Provider  albuterol (PROVENTIL HFA;VENTOLIN HFA) 108 (90 Base) MCG/ACT inhaler Inhale 2 puffs into the lungs every 4 (four) hours as needed for shortness of breath.  06/21/15 05/16/21  [provider]  ALPRAZolam [name]) 1 MG tablet Take 1 tablet by mouth 3 (three) times daily as needed for anxiety. 05/10/20   [provider]  aspirin EC 81 MG tablet Take 1 tablet (81 mg total) by mouth daily. Swallow whole. 09/02/20    Jettie Booze, MD  cyclobenzaprine (FLEXERIL) 10 MG tablet Take 1 tablet (10 mg total) by mouth 3 (three) times daily as needed for muscle spasms. 03/29/17   [name], NP  docusate sodium (COLACE) 100 MG capsule Take 100 mg by mouth 2 (two) times daily as needed for constipation. 12/19/19   [provider]  furosemide (LASIX) 40 MG tablet Take 40 mg by mouth daily.    [provider]  gabapentin (NEURONTIN) 300 MG capsule Take 300 mg by mouth daily as needed (For pain). 03/11/20   [provider]  HYDROcodone-acetaminophen (NORCO/VICODIN) 5-325 MG tablet Take 1-2 tablets by mouth every 6 (six) hours as needed for moderate pain. 05/21/20   Shelly Coss, MD  ipratropium-albuterol (DUONEB) 0.5-2.5 (3) MG/3ML SOLN Take 3 mLs by nebulization every 6 (six) hours as needed. 05/21/21   [name], [name], MD  isosorbide mononitrate (IMDUR) 30 MG 24 hr tablet Take 2 tablets (60 mg total) by mouth daily. 07/17/16   [name], MD  levothyroxine (SYNTHROID) 112 MCG tablet TAKE 1 TABLET BY MOUTH EVERY MORNING 12/06/20   [name], MD  LINZESS 145 MCG CAPS capsule Take 145 mcg by mouth daily. 04/30/20   [provider]  metoprolol succinate (TOPROL-XL) 25 MG 24 hr tablet Take 0.5 tablets (12.5 mg total) by mouth daily. 05/21/21   [name], [name], MD  nitroGLYCERIN (NITROSTAT) 0.3 MG SL tablet Place 0.3 mg under the tongue every 5 (five) minutes as needed for chest pain. 10/28/15   [provider]  predniSONE (STERAPRED UNI-PAK 21 TAB) 10 MG (21) TBPK tablet Use per package instructions 05/21/21   [name], [name], MD  rosuvastatin (CRESTOR) 20 MG tablet Take 1 tablet (20 mg total) by mouth daily. Patient not taking: Reported on 05/16/2021 07/20/20   Jettie Booze, MD  triamcinolone cream (KENALOG) 0.5 % Apply 1 application topically daily as needed (for sun exposure).    [provider]  UNABLE TO FIND Take 2 tablets by mouth  daily. Med Name: Garlic and Honey (homemade regimen) - two tablets a day with tsp of honey per patient    [provider]    Physical Exam: Vitals:   06/08/21 1230 06/08/21 1245 06/08/21 1255 06/08/21 1315  BP: (!) 86/68 98/72  98/68  Pulse: 60 62  63  Resp: _0 Temp:      TempSrc:      SpO2:  100%  99%  Weight:   81.6 kg   Height:   _1  (1.626 m)     Constitutional: NAD, calm, comfortable Vitals:   06/08/21 1230 06/08/21 1245 06/08/21 1255 06/08/21 1315  BP: (!) 86/68 98/72  98/68  Pulse: 60 62  63  Resp: _0 Temp:      TempSrc:      SpO2:  100%  99%  Weight:   81.6 kg   Height:   _1  (1.626 m)    Eyes: PERRL, lids and conjunctivae normal ENMT: Mucous membranes are moist. Posterior pharynx clear of any exudate or lesions.Normal dentition.  Neck: normal, supple, no masses, no thyromegaly Respiratory: clear to auscultation bilaterally, no wheezing, no crackles. Normal respiratory effort. No accessory muscle use.  Cardiovascular: Regular rate and rhythm, no murmurs / rubs / gallops. No extremity edema. 2+ pedal pulses. No carotid bruits.  Abdomen: no tenderness, no masses palpated. No hepatosplenomegaly. Bowel sounds positive.  Musculoskeletal: no clubbing / cyanosis. No joint deformity upper and lower extremities. Good ROM, no contractures. Normal muscle tone.  Skin: no rashes, lesions, ulcers. No induration Neurologic: CN 2-12 grossly intact. Sensation intact, DTR normal. Strength 5/5 in all 4.  Psychiatric: Normal judgment and insight. Alert and oriented x 3. Normal mood.     Labs on Admission: I have personally reviewed following labs and imaging studies  CBC: Recent Labs  Lab 06/08/21 0925 06/08/21 0937  WBC 5.7  --   NEUTROABS 2.5  --   HGB 12.8 12.6  HCT 38.0 37.0  MCV 88.6  --   PLT 267  --    Basic Metabolic Panel: Recent Labs  Lab 06/08/21 0925 06/08/21 0937  NA 136 139  K 4.2 4.1  CL 100 101  CO2 25  --   GLUCOSE 97  98  BUN 18 18  CREATININE 1.30* 1.40*  CALCIUM 9.3  --    GFR: Estimated Creatinine Clearance: 44.7 mL/min (A) (by C-G formula based on SCr of 1.4 mg/dL (H)). Liver Function Tests: Recent Labs  Lab 06/08/21 0925  AST 20  ALT 30  ALKPHOS 61  BILITOT 0.9  PROT 6.4*  ALBUMIN 3.0*   No results for input(s): LIPASE, AMYLASE in the last 168 hours. No results for input(s): AMMONIA in the last 168 hours. Coagulation Profile: Recent Labs  Lab 06/08/21 0925  INR 1.1   Cardiac Enzymes: No results for input(s): CKTOTAL, CKMB, CKMBINDEX, TROPONINI in the last 168 hours. BNP (last 3 results) No results for input(s): PROBNP in the last 8760 hours. HbA1C: No results for input(s): HGBA1C in the last 72 hours. CBG: Recent Labs  Lab 06/08/21 0937  GLUCAP 104*   Lipid Profile: No results for input(s): CHOL, HDL, LDLCALC, TRIG, CHOLHDL, LDLDIRECT in the last 72 hours. Thyroid Function Tests: No results for input(s): TSH, T4TOTAL, FREET4, T3FREE, THYROIDAB in the last 72 hours. Anemia Panel: No results for input(s): VITAMINB12, FOLATE, FERRITIN, TIBC, IRON, RETICCTPCT in the last 72 hours. Urine analysis:    Component Value Date/Time   COLORURINE YELLOW 05/15/2020 1654   APPEARANCEUR CLEAR 05/15/2020 1654   LABSPEC 1.010 05/15/2020 1654   PHURINE 5.5 05/15/2020 [place] 05/15/2020 1654   HGBUR NEGATIVE 05/15/2020 1654   BILIRUBINUR NEGATIVE 05/15/2020 1654   KETONESUR NEGATIVE 05/15/2020 1654   PROTEINUR NEGATIVE 05/15/2020 1654   NITRITE NEGATIVE 05/15/2020 1654   LEUKOCYTESUR TRACE (A) 05/15/2020 1654    Radiological Exams on Admission: CT HEAD WO CONTRAST  Result Date: 06/08/2021 CLINICAL DATA:  Neuro deficit, acute, stroke suspected EXAM: CT HEAD WITHOUT CONTRAST TECHNIQUE: Contiguous axial images were obtained from the base of the skull through the vertex without intravenous contrast. COMPARISON:  08/20/2016. FINDINGS: Brain: No evidence of acute  infarction,  hemorrhage, hydrocephalus, extra-axial collection or mass lesion/mass effect. Vascular: No hyperdense vessel identified. Calcific intracranial atherosclerosis. Skull: No evidence of acute fracture. Sinuses/Orbits: Mild paranasal sinus mucosal thickening. Unremarkable orbits. Other: No mastoid effusions. IMPRESSION: No evidence of acute intracranial abnormality. Electronically Signed   By: Margaretha Sheffield M.D.   On: 06/08/2021 10:11   MR BRAIN WO CONTRAST  Result Date: 06/08/2021 CLINICAL DATA:  Neuro deficit, acute, stroke suspected EXAM: MRI HEAD WITHOUT CONTRAST TECHNIQUE: Multiplanar, multiecho pulse sequences of the brain and surrounding structures were obtained without intravenous contrast. COMPARISON:  Same day CT/CTA. FINDINGS: Brain: Small acute cortical infarcts in the left parietal and superior temporal lobe, likely distal left MCA territory. Faint edema without mass effect. No midline shift. Basal cisterns are patent. Otherwise, mild scattered T2 hyperintensities in the white matter, nonspecific but compatible with chronic microvascular ischemic disease. No hydrocephalus, mass lesion, extra-axial fluid collection, or acute hemorrhage. Vascular: Better evaluated on concurrent CTA. Skull and upper cervical spine: Heterogeneous marrow without focal suspicious lesion. Sinuses/Orbits: Minimal paranasal sinus mucosal thickening. Unremarkable orbits. Other: No sizable mastoid effusions. IMPRESSION: 1. Small acute cortical infarcts in the left parietal and superior temporal lobe, likely distal left MCA territory. Faint edema without mass effect. 2. Mild chronic microvascular disease. Electronically Signed   By: Margaretha Sheffield M.D.   On: 06/08/2021 12:20   CT ANGIO HEAD NECK W WO CM W PERF (CODE STROKE)  Addendum Date: 06/08/2021   ADDENDUM REPORT: 06/08/2021 12:59 ADDENDUM: CTA neck impression #3 and CTA head impression #1 called by telephone at the time of interpretation on 06/08/2021 at  12:23 pm to provider Dr. Roslynn Amble, who verbally acknowledged these results. Electronically Signed   By: Kellie Simmering D.O.   On: 06/08/2021 12:59   Result Date: 06/08/2021 CLINICAL DATA:  Neuro deficit, acute, stroke suspected. Additional history provided: Aphasia. EXAM: CT ANGIOGRAPHY HEAD AND NECK CT PERFUSION BRAIN TECHNIQUE: Multidetector CT imaging of the head and neck was performed using the standard protocol during bolus administration of intravenous contrast. Multiplanar CT image reconstructions and MIPs were obtained to evaluate the vascular anatomy. Carotid stenosis measurements (when applicable) are obtained utilizing NASCET criteria, using the distal internal carotid diameter as the denominator. Multiphase CT imaging of the brain was performed following IV bolus contrast injection. Subsequent parametric perfusion maps were calculated using RAPID software. CONTRAST:  172m OMNIPAQUE IOHEXOL 350 MG/ML SOLN COMPARISON:  Noncontrast head CT performed earlier today 06/08/2021. FINDINGS: CTA NECK FINDINGS Aortic arch: Standard aortic branching. Soft and calcified plaque within the visualized aortic arch and proximal major branch vessels of the neck. Probable non-acute penetrating atherosclerotic ulceration and/or chronic focal dissection of the lateral arch (for instance as seen on series 7, images 304 and 305) (series 10, image 168). The arch is nonaneurysmal at this level, measuring 2.5 cm in diameter. No hemodynamically significant innominate or proximal subclavian artery stenosis. Right carotid system: CCA and ICA patent within the neck without significant stenosis (50% or greater). Mild atherosclerotic plaque within the distal CCA, carotid bifurcation and proximal ICA. Left carotid system: CCA and ICA patent within the neck without significant stenosis (50% or greater). Mild to moderate predominantly calcified plaque within the distal CCA, carotid bifurcation and proximal ICA. Vertebral arteries: Streak  and beam hardening artifact arising from a dense right-sided contrast bolus partially obscures the V1 right vertebral artery. Within this limitation, the vertebral arteries are patent within the neck without appreciable stenosis. Minimal nonstenotic calcified plaque within the distal cervical left vertebral artery. Skeleton:  Cervical spondylosis. No acute bony abnormality or aggressive osseous lesion partially imaged thoracic levocurvature. Other neck: 11 mm right thyroid lobe nodule. The left thyroid lobe is poorly delineated, markedly atrophic or surgically absent. Upper chest: The centrilobular and paraseptal emphysema. No consolidation within the imaged lung apices. Review of the MIP images confirms the above findings CTA HEAD FINDINGS Anterior circulation: The intracranial internal carotid arteries are patent. Mild calcified plaque within both vessels without stenosis. The M1 middle cerebral arteries are patent. No M2 proximal branch occlusion or high-grade proximal stenosis is identified. The anterior cerebral arteries are patent. No intracranial aneurysm is identified. Posterior circulation: The intracranial vertebral arteries are patent. The basilar artery is patent. The posterior cerebral arteries are patent. The posterior cerebral arteries are fetal in origin bilaterally. Venous sinuses: Within the limitations of contrast timing, no convincing thrombus. Anatomic variants: As described. Review of the MIP images confirms the above findings CT Brain Perfusion Findings: CBF (<30%) Volume: 23m Perfusion (Tmax>6.0s) volume: 0862m(However, the perfusion software identifies a 13 mL region of hypoperfusion within the posterior left MCA territory utilizing the Tmax >4.0 seconds threshold). Mismatch Volume: 62m6mnfarction Location:None identified IMPRESSION: CTA neck: 1. The common carotid and internal carotid arteries are patent within the neck without hemodynamically significant stenosis (50% or greater).  Atherosclerotic plaque within the bilateral carotid systems within the neck, as described. 2. Streak and beam hardening artifact arising from a dense right-sided contrast bolus partially obscures the V1 right vertebral artery. Within this limitation, the vertebral arteries are patent within the neck without appreciable stenosis. 3. Likely non-acute penetrating atherosclerotic ulceration and/or chronic focal dissection within the lateral aortic arch. The aortic arch is nonaneurysmal at this level. 4. Aortic Atherosclerosis (ICD10-I70.0) and Emphysema (ICD10-J43.9). CTA head: 1. No intracranial large vessel occlusion or proximal high-grade arterial stenosis is identified. 2. Calcified plaque within the intracranial internal carotid arteries with no more than mild stenosis. CT perfusion head: The perfusion software identifies no core infarct. The perfusion software identifies no hypoperfused parenchyma utilizing the Tmax>6 seconds threshold. However, the perfusion software identifies a 13 mL region of hypoperfusion within the posterior left MCA territory utilizing the Tmax >4.0 seconds threshold. Electronically Signed: By: KylKellie SimmeringO. On: 06/08/2021 12:23    EKG: Independently reviewed.  Sinus, chronic nonspecific ST changes on chest leads.  Assessment/Plan Active Problems:   Stroke (cerebrum) (HCC)   [place]) [place](please populate well all problems here in Problem List. (For example, if patient is on BP meds at home and you resume or decide to hold them, it is a problem that needs to be her. Same for CAD, COPD, HLD and so on)  Acute receptive and expressive aphasia -Secondary to acute left parietal and superior temporal lobe infarct -History involve multiple cardiovascular events including MI in 1999371hromboembolic event of renal and splenic infarct last year. -Discussed with neurology, increase aspirin to 325 mg daily, check lipid panel, continue statin. -Other DDx, she  has a headache, associated with a CVA, and MRI showed edema around stroke side which is not typical, raised the concern about vasculitis, will order ESR, discussed with neurology.  Abnormal CT finding on lateral aorta -CTA recent concern about chronic dissection versus atherosclerotic plaque ulcerative changes on lateral aortic, patient denied any chest pains, no pulseless changes, she has no history of connective-tissue disease, no family history of SLE or RA.  Check ESR.  Placed call to vascular surgery to review CT angiogram finding and further  recommendations, likely outpatient follow-up with vascular surgery in the future.  Chronic combined systolic and diastolic CHF -Euvolemic -Low permissive hypertension, hold off BP and CHF medications including Lasix  HTN -As needed hydralazine  CAD, COPD, anxiety depression, hypothyroidism -Stable, continue home meds.  DVT prophylaxis: Lovenox Code Status: Full code Family Communication: Daughter at bedside Disposition Plan: Expect less than 2 midnight hospital stay, expect discharge home within 24 hours. Consults called: Neurology, vascular surgery Admission status: Telemetry ops.   Lequita Halt MD [facility] Pager [phone]  06/08/2021, 1:47 PM

## 2021-06-09 ENCOUNTER — Observation Stay (HOSPITAL_COMMUNITY): Payer: Medicare Other

## 2021-06-09 ENCOUNTER — Other Ambulatory Visit (HOSPITAL_COMMUNITY): Payer: Self-pay

## 2021-06-09 DIAGNOSIS — Z6830 Body mass index (BMI) 30.0-30.9, adult: Secondary | ICD-10-CM | POA: Diagnosis not present

## 2021-06-09 DIAGNOSIS — Z8673 Personal history of transient ischemic attack (TIA), and cerebral infarction without residual deficits: Secondary | ICD-10-CM | POA: Diagnosis not present

## 2021-06-09 DIAGNOSIS — F419 Anxiety disorder, unspecified: Secondary | ICD-10-CM | POA: Diagnosis present

## 2021-06-09 DIAGNOSIS — I639 Cerebral infarction, unspecified: Secondary | ICD-10-CM | POA: Diagnosis not present

## 2021-06-09 DIAGNOSIS — R4701 Aphasia: Secondary | ICD-10-CM | POA: Diagnosis not present

## 2021-06-09 DIAGNOSIS — R519 Headache, unspecified: Secondary | ICD-10-CM | POA: Diagnosis not present

## 2021-06-09 DIAGNOSIS — J449 Chronic obstructive pulmonary disease, unspecified: Secondary | ICD-10-CM | POA: Diagnosis not present

## 2021-06-09 DIAGNOSIS — R29701 NIHSS score 1: Secondary | ICD-10-CM | POA: Diagnosis not present

## 2021-06-09 DIAGNOSIS — I6300 Cerebral infarction due to thrombosis of unspecified precerebral artery: Secondary | ICD-10-CM

## 2021-06-09 DIAGNOSIS — E890 Postprocedural hypothyroidism: Secondary | ICD-10-CM | POA: Diagnosis not present

## 2021-06-09 DIAGNOSIS — Z8249 Family history of ischemic heart disease and other diseases of the circulatory system: Secondary | ICD-10-CM | POA: Diagnosis not present

## 2021-06-09 DIAGNOSIS — F32A Depression, unspecified: Secondary | ICD-10-CM | POA: Diagnosis not present

## 2021-06-09 DIAGNOSIS — I5032 Chronic diastolic (congestive) heart failure: Secondary | ICD-10-CM | POA: Diagnosis not present

## 2021-06-09 DIAGNOSIS — I6340 Cerebral infarction due to embolism of unspecified cerebral artery: Secondary | ICD-10-CM | POA: Diagnosis not present

## 2021-06-09 DIAGNOSIS — R9389 Abnormal findings on diagnostic imaging of other specified body structures: Secondary | ICD-10-CM | POA: Diagnosis not present

## 2021-06-09 DIAGNOSIS — I252 Old myocardial infarction: Secondary | ICD-10-CM | POA: Diagnosis not present

## 2021-06-09 DIAGNOSIS — Z885 Allergy status to narcotic agent status: Secondary | ICD-10-CM | POA: Diagnosis not present

## 2021-06-09 DIAGNOSIS — Z7982 Long term (current) use of aspirin: Secondary | ICD-10-CM | POA: Diagnosis not present

## 2021-06-09 DIAGNOSIS — I700 Atherosclerosis of aorta: Secondary | ICD-10-CM | POA: Diagnosis not present

## 2021-06-09 DIAGNOSIS — Z823 Family history of stroke: Secondary | ICD-10-CM | POA: Diagnosis not present

## 2021-06-09 DIAGNOSIS — E7800 Pure hypercholesterolemia, unspecified: Secondary | ICD-10-CM | POA: Diagnosis present

## 2021-06-09 DIAGNOSIS — Z20822 Contact with and (suspected) exposure to covid-19: Secondary | ICD-10-CM | POA: Diagnosis not present

## 2021-06-09 DIAGNOSIS — Z79899 Other long term (current) drug therapy: Secondary | ICD-10-CM | POA: Diagnosis not present

## 2021-06-09 DIAGNOSIS — F17210 Nicotine dependence, cigarettes, uncomplicated: Secondary | ICD-10-CM | POA: Diagnosis present

## 2021-06-09 DIAGNOSIS — I2510 Atherosclerotic heart disease of native coronary artery without angina pectoris: Secondary | ICD-10-CM | POA: Diagnosis present

## 2021-06-09 DIAGNOSIS — Z955 Presence of coronary angioplasty implant and graft: Secondary | ICD-10-CM | POA: Diagnosis not present

## 2021-06-09 DIAGNOSIS — E669 Obesity, unspecified: Secondary | ICD-10-CM | POA: Diagnosis present

## 2021-06-09 DIAGNOSIS — I5042 Chronic combined systolic (congestive) and diastolic (congestive) heart failure: Secondary | ICD-10-CM | POA: Diagnosis not present

## 2021-06-09 DIAGNOSIS — I081 Rheumatic disorders of both mitral and tricuspid valves: Secondary | ICD-10-CM | POA: Diagnosis not present

## 2021-06-09 DIAGNOSIS — I25110 Atherosclerotic heart disease of native coronary artery with unstable angina pectoris: Secondary | ICD-10-CM | POA: Diagnosis not present

## 2021-06-09 DIAGNOSIS — I10 Essential (primary) hypertension: Secondary | ICD-10-CM | POA: Diagnosis not present

## 2021-06-09 DIAGNOSIS — I110 Hypertensive heart disease with heart failure: Secondary | ICD-10-CM | POA: Diagnosis not present

## 2021-06-09 LAB — BASIC METABOLIC PANEL
Anion gap: 10 (ref 5–15)
BUN: 19 mg/dL (ref 6–20)
CO2: 25 mmol/L (ref 22–32)
Calcium: 9.2 mg/dL (ref 8.9–10.3)
Chloride: 102 mmol/L (ref 98–111)
Creatinine, Ser: 1.17 mg/dL — ABNORMAL HIGH (ref 0.44–1.00)
GFR, Estimated: 54 mL/min — ABNORMAL LOW (ref >60)
Glucose, Bld: 101 mg/dL — ABNORMAL HIGH (ref 70–99)
Potassium: 4.2 mmol/L (ref 3.5–5.1)
Sodium: 137 mmol/L (ref 135–145)

## 2021-06-09 LAB — LIPID PANEL
Cholesterol: 178 mg/dL (ref 0–200)
HDL: 46 mg/dL (ref >40)
LDL Cholesterol: 112 mg/dL — ABNORMAL HIGH (ref 0–99)
Total CHOL/HDL Ratio: 3.9 RATIO
Triglycerides: 98 mg/dL (ref <150)
VLDL: 20 mg/dL (ref 0–40)

## 2021-06-09 LAB — HEMOGLOBIN A1C
Hgb A1c MFr Bld: 6.2 % — ABNORMAL HIGH (ref 4.8–5.6)
Mean Plasma Glucose: 131.24 mg/dL

## 2021-06-09 NOTE — Care Management Obs Status (Signed)
[place] NOTIFICATION   Patient Details  Name: Jocelyn [name] MRN: [id number] Date of Birth: 1961/03/23   Medicare Observation Status Notification Given:  Yes    Carles Collet, RN 06/09/2021, 2:54 PM

## 2021-06-09 NOTE — Progress Notes (Signed)
OT Cancellation Note  Patient Details Name: Jocelyn [name] MRN: [id number] DOB: 01/25/1961   Cancelled Treatment:    Reason Eval/Treat Not Completed: Patient at procedure or test/ unavailable (Pt with care time upon arrival, PT evaluation to f/u as appropriate.)  Shomari Scicchitano A Avrom Robarts 06/09/2021, 11:09 AM

## 2021-06-09 NOTE — Progress Notes (Signed)
    [facility] has been requested to perform a transesophageal echocardiogram on Jocelyn [name] for stroke.  After careful review of history and examination, the risks and benefits of transesophageal echocardiogram have been explained including risks of esophageal damage, perforation (1:10,000 risk), bleeding, pharyngeal hematoma as well as other potential complications associated with conscious sedation including aspiration, arrhythmia, respiratory failure and death. Alternatives to treatment were discussed, questions were answered. Patient is willing to proceed.   Pt scheduled with Dr. Stanford Breed 06/10/21 at 1330. NPO at MN please.  Tami Lin Hence [place], [place]  06/09/2021 3:52 PM

## 2021-06-09 NOTE — Progress Notes (Signed)
PT Cancellation Note  Patient Details Name: Jocelyn [name] MRN: [id number] DOB: 09/20/1960   Cancelled Treatment:    Reason Eval/Treat Not Completed: PT screened, no needs identified, will sign off; no needs identified by OT and spoke with pt since has two level home with upstairs bedroom, but she declined stating feels she is able to mobilize as usual.  PT to sign off.    Reginia Naas 06/09/2021, 12:42 PM Magda Kiel, PT Acute Rehabilitation Services Pager:[phone] [phone] 06/09/2021

## 2021-06-09 NOTE — TOC Initial Note (Addendum)
Transition of Care [facility]) - Initial/Assessment Note    Patient Details  Name: Jocelyn [name] MRN: [id number] Date of Birth: 09-Jan-1961  Transition of Care [facility] CM/SW Contact:    Jocelyn Collet, RN Phone Number: 06/09/2021, 2:54 PM  Clinical Narrative:       Jocelyn [name] w patient at bedside. She states that she will go to her daughter's house at [place], gave permission to set up DC services with her daughter.    No DME needs identified. Discussed home services, and outpatient SLP has been set up [employer]. Referral placed through Epic. Daughter Jocelyn [name] understands to call office when patient discharges to expedite appointment time. Patient wil stay with daughter Jocelyn [name] at: [address] [place] [address]           Expected Discharge Plan: Home/Self Care Barriers to Discharge: Continued Medical Work up   Patient Goals and CMS Choice Patient states their goals for this hospitalization and ongoing recovery are:: return to home      Expected Discharge Plan and Services Expected Discharge Plan: Home/Self Care   Discharge Planning Services: CM Consult   Living arrangements for the past 2 months: Single Family Home Expected Discharge Date: 06/12/21                                    Prior Living Arrangements/Services Living arrangements for the past 2 months: Single Family Home Lives with:: Adult Children                   Activities of Daily Living Home Assistive Devices/Equipment: Oxygen ADL Screening (condition at time of admission) Patient's cognitive ability adequate to safely complete daily activities?: Yes Is the patient deaf or have difficulty hearing?: No Does the patient have difficulty seeing, even when wearing glasses/contacts?: No Does the patient have difficulty concentrating, remembering, or making decisions?: No Patient able to express need for assistance with ADLs?: Yes Does the patient have difficulty dressing or bathing?: Yes Independently  performs ADLs?: No Communication: Independent Dressing (OT): Needs assistance Is this a change from baseline?: Change from baseline, expected to last <3days Grooming: Needs assistance Is this a change from baseline?: Change from baseline, expected to last <3 days Feeding: Independent Bathing: Needs assistance Is this a change from baseline?: Change from baseline, expected to last <3 days Toileting: Needs assistance Is this a change from baseline?: Change from baseline, expected to last <3 days In/Out Bed: Needs assistance Is this a change from baseline?: Change from baseline, expected to last <3 days Walks in Home: Independent Does the patient have difficulty walking or climbing stairs?: Yes Weakness of Legs: Both Weakness of Arms/Hands: None  Permission Sought/Granted                  Emotional Assessment              Admission diagnosis:  CVA (cerebral vascular accident) ([place]) [I63.9] Cerebral infarction, unspecified mechanism ([place]) [I63.9] Stroke (cerebrum) ([place]) [I63.9] Patient Active Problem List   Diagnosis Date Noted   Stroke (cerebrum) ([place]) 06/08/2021   Cerebral infarction ([place]) 06/08/2021   COPD exacerbation ([place]) 05/16/2021   Acute on chronic respiratory failure with hypoxia ([place]) 05/16/2021   Hypokalemia 05/16/2021   AKI (acute kidney injury) ([place]) 05/16/2021   LV (left ventricular) mural thrombus 05/25/2020   Renal infarct ([place]) 05/16/2020   Hypoxia 05/16/2020   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   Proptosis  12/27/2019   Multinodular goiter 03/27/2019   Hypothyroidism 09/26/2018   Sacroiliitis, not elsewhere classified ([place]) 04/24/2017   Chronic right-sided low back pain with right-sided sciatica 01/12/2017   Chronic bilateral thoracic back pain 01/12/2017   Other spondylosis with radiculopathy, lumbar region 10/19/2016   Depression with anxiety 07/16/2016   Chronic diastolic CHF (congestive heart failure) ([place]) 07/16/2016   Aortic atherosclerosis ([place])  11/24/2015   Unstable angina ([place]) 08/21/2015   Chest pain    Coronary artery disease    CAD (coronary artery disease), native coronary artery 01/14/2015   Hypertension 01/14/2015   Hypercholesterolemia 01/14/2015   Tobacco abuse 01/14/2015   PCP:  Benito Mccreedy, MD Pharmacy:   [id number] - [place], [place] [address] Phone: [phone] Fax: [phone]  [id number] [place], [place] - [address] [facility] [place] [address] Phone: [phone] Fax: [phone]     Social Determinants of Health (SDOH) Interventions    Readmission Risk Interventions Readmission Risk Prevention Plan 05/21/2021  Post Dischage Appt Complete  Medication Screening Complete  Transportation Screening Complete  Some recent data might be hidden

## 2021-06-09 NOTE — Progress Notes (Signed)
TCD bubble study and lower extremity venous has been completed.   Preliminary results in CV Proc.   Jocelyn [name] Jocelyn [name] 06/09/2021 2:12 PM

## 2021-06-09 NOTE — Evaluation (Signed)
Speech Language Pathology Evaluation Patient Details Name: Jocelyn [name] MRN: [id number] DOB: 08/11/1960 Today's Date: 06/09/2021 Time: 1020-1055 SLP Time Calculation (min) (ACUTE ONLY): 35 min  Problem List:  Patient Active Problem List   Diagnosis Date Noted   Stroke (cerebrum) ([place]) 06/08/2021   Cerebral infarction ([place]) 06/08/2021   COPD exacerbation ([place]) 05/16/2021   Acute on chronic respiratory failure with hypoxia ([place]) 05/16/2021   Hypokalemia 05/16/2021   AKI (acute kidney injury) ([place]) 05/16/2021   LV (left ventricular) mural thrombus 05/25/2020   Renal infarct ([place]) 05/16/2020   Hypoxia 05/16/2020   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   Proptosis 12/27/2019   Multinodular goiter 03/27/2019   Hypothyroidism 09/26/2018   Sacroiliitis, not elsewhere classified ([place]) 04/24/2017   Chronic right-sided low back pain with right-sided sciatica 01/12/2017   Chronic bilateral thoracic back pain 01/12/2017   Other spondylosis with radiculopathy, lumbar region 10/19/2016   Depression with anxiety 07/16/2016   Chronic diastolic CHF (congestive heart failure) ([place]) 07/16/2016   Aortic atherosclerosis ([place]) 11/24/2015   Unstable angina ([place]) 08/21/2015   Chest pain    Coronary artery disease    CAD (coronary artery disease), native coronary artery 01/14/2015   Hypertension 01/14/2015   Hypercholesterolemia 01/14/2015   Tobacco abuse 01/14/2015   Past Medical History:  Past Medical History:  Diagnosis Date   Anxiety    Arthritis    Chronic diastolic CHF (congestive heart failure), NYHA class 2 ([place])    COPD (chronic obstructive pulmonary disease) ([place])    Depression    HTN (hypertension)    Hypercholesterolemia    Hypothyroidism (acquired)    Kidney stones 2015   Myocardial infarction ([place])    x3- pt unsure if MIs were in 1996 or 1999, had stent fixed in 2016 but no MI at that time per pt   Thyrotoxicosis    TIA (transient ischemic attack)    2013   Past Surgical History:   Past Surgical History:  Procedure Laterality Date   BREAST CYST EXCISION     CARDIAC CATHETERIZATION N/A 08/24/2015   Procedure: Left Heart Cath and Coronary Angiography;  Surgeon: Jettie Booze, MD;  Location: [facility];  Service: Cardiovascular;  Laterality: N/A;   COLONOSCOPY     CORONARY [place]   and then had procedure to repair in 2016   KNEE SURGERY     partial hysterec     THYROIDECTOMY     HPI:  60yo female admitted 06/08/21 with speech problems. PMH: HTN, chronic combined systolic and diasolic CHF, COPD, HLD, MI s/p stent x3 (1999), LV thrombosis, renal and splenic infarct (2021), anxiety/depression, hypothyroidism, CVA (7 years ago). 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   Assessment / Plan / Recommendation Clinical Impression  Pt seen at bedside for evaluation of speech and receptive/expressive language function. Pts daughter was present during this session, and reports occasional difficulty with "not getting words where they are supposed to be", and paraphasic errors (nine for nurse). Pt's speech is fully intelligible without dysarthria. Pt exhibited mild difficulty with follow multi-step commands, requiring additional time to process the direction to follow it. Expressively, pt was able to produce automatic sequences, answer responsive and confrontation naming tasks, and repeat multisyllable words. She exhibited hestation with repetition of sentence length material. Verbal fluency was intact. Pt exhibits very mild receptive and expressive language deficits, which are frustrating to her. SLP will continue to follow acutely to address  these issues, which pt reports are improving. If deficits remain at discharge, recommend home health or outpatient speech therapy.    SLP Assessment  SLP Recommendation/Assessment: Patient needs continued Speech Language Pathology Services    Recommendations  for follow up therapy are one component of a multi-disciplinary discharge planning process, led by the attending physician.  Recommendations may be updated based on patient status, additional functional criteria and insurance authorization.    Follow Up Recommendations  Home health SLP    Assistance Recommended at Discharge  None  Functional Status Assessment Patient has had a recent decline in their functional status and demonstrates the ability to make significant improvements in function in a reasonable and predictable amount of time.   Frequency and Duration min 1 x/week  1 week      SLP Evaluation Cognition  Overall Cognitive Status: Within Functional Limits for tasks assessed Arousal/Alertness: Awake/alert Orientation Level: Oriented X4       Comprehension  Auditory Comprehension Overall Auditory Comprehension: Impaired Yes/No Questions: Within Functional Limits Basic Biographical Questions: 76-100% accurate Basic Immediate Environment Questions: 75-100% accurate Complex Questions: 75-100% accurate (mild difficulty) Commands: Impaired One Step Basic Commands: 75-100% accurate Two Step Basic Commands: 75-100% accurate Multistep Basic Commands: 75-100% accurate (pt required extra time to process multistep direction) Conversation: Simple Other Conversation Comments: intermittent paraphasic errors noted    Expression Expression Primary Mode of Expression: Verbal Verbal Expression Overall Verbal Expression: Impaired Automatic Speech: Name;Social Response;Counting;Day of week;Month of year Level of Generative/Spontaneous Verbalization: Conversation Repetition: No impairment Naming:  (intermittent word finding errors, paraphasic errors) Pragmatics: No impairment Non-Verbal Means of Communication: Not applicable Written Expression Dominant Hand: Right   Oral / Motor  Oral Motor/Sensory Function Overall Oral Motor/Sensory Function: Within functional limits Motor  Speech Overall Motor Speech: Appears within functional limits for tasks assessed Intelligibility: Intelligible Motor Planning: Witnin functional limits   GO                   Anden Bartolo B. Quentin Ore, [facility], [place] Speech Language Pathologist Office: [phone]  Shonna Chock 06/09/2021, 11:13 AM

## 2021-06-09 NOTE — TOC Benefit Eligibility Note (Signed)
Patient Advocate Encounter ° °Insurance verification completed.   ° °The patient is currently admitted and upon discharge could be taking Eliquis 5 mg. ° °The current 30 day co-pay is, $0.00.  ° °The patient is currently admitted and upon discharge could be taking Xarelto 20 mg. ° °The current 30 day co-pay is, $0.00.  ° °The patient is insured through [employer] Medicare Part D  ° ° ° °Kalisa Girtman, CPhT °Pharmacy Patient Advocate Specialist °[facility] Patient Advocate Team °Direct Number: [phone]  Fax: [phone] ° ° ° ° ° °  °

## 2021-06-09 NOTE — Progress Notes (Addendum)
STROKE TEAM PROGRESS NOTE   INTERVAL HISTORY Her daughter is at the bedside.  Other daughter was called during our examination.  Patient states her speech and language abilities appear to be improving.  MRI scan shows cortical left temporoparietal embolic infarct.  CT angiogram of brain and neck did not show significant large vessel stenosis or occlusion.  Hemoglobin A1c 6.2.  LDL cholesterol 112 mg percent.  Transthoracic echo shows no clot normal ejection fraction. Vitals:   06/08/21 2030 06/09/21 0032 06/09/21 0405 06/09/21 0811  BP: (!) 98/51 (!) 102/59 107/62 105/69  Pulse: 69 (!) 57 63 (!) 53  Resp: 16 13 18 17   Temp: 97.6 F (36.4 C) 98 F (36.7 C) (!) 97.5 F (36.4 C) 98.2 F (36.8 C)  TempSrc: Oral Oral Oral   SpO2: 96% 98% 96% 94%  Weight:      Height:       CBC:  Recent Labs  Lab 06/08/21 0925 06/08/21 0937  WBC 5.7  --   NEUTROABS 2.5  --   HGB 12.8 12.6  HCT 38.0 37.0  MCV 88.6  --   PLT 267  --    Basic Metabolic Panel:  Recent Labs  Lab 06/08/21 0925 06/08/21 0937 06/09/21 0355  NA 136 139 137  K 4.2 4.1 4.2  CL 100 101 102  CO2 25  --  25  GLUCOSE 97 98 101*  BUN 18 18 19   CREATININE 1.30* 1.40* 1.17*  CALCIUM 9.3  --  9.2    Lipid Panel:  Recent Labs  Lab 06/09/21 0355  CHOL 178  TRIG 98  HDL 46  CHOLHDL 3.9  VLDL 20  LDLCALC 112*    HgbA1c:  Recent Labs  Lab 06/09/21 0355  HGBA1C 6.2*   Urine Drug Screen: No results for input(s): LABOPIA, COCAINSCRNUR, LABBENZ, AMPHETMU, THCU, LABBARB in the last 168 hours.  Alcohol Level No results for input(s): ETH in the last 168 hours.  IMAGING past 24 hours MR BRAIN WO CONTRAST  Result Date: 06/08/2021 CLINICAL DATA:  Neuro deficit, acute, stroke suspected EXAM: MRI HEAD WITHOUT CONTRAST TECHNIQUE: Multiplanar, multiecho pulse sequences of the brain and surrounding structures were obtained without intravenous contrast. COMPARISON:  Same day CT/CTA. FINDINGS: Brain: Small acute cortical  infarcts in the left parietal and superior temporal lobe, likely distal left MCA territory. Faint edema without mass effect. No midline shift. Basal cisterns are patent. Otherwise, mild scattered T2 hyperintensities in the white matter, nonspecific but compatible with chronic microvascular ischemic disease. No hydrocephalus, mass lesion, extra-axial fluid collection, or acute hemorrhage. Vascular: Better evaluated on concurrent CTA. Skull and upper cervical spine: Heterogeneous marrow without focal suspicious lesion. Sinuses/Orbits: Minimal paranasal sinus mucosal thickening. Unremarkable orbits. Other: No sizable mastoid effusions. IMPRESSION: 1. Small acute cortical infarcts in the left parietal and superior temporal lobe, likely distal left MCA territory. Faint edema without mass effect. 2. Mild chronic microvascular disease. Electronically Signed   By: Margaretha Sheffield M.D.   On: 06/08/2021 12:20   ECHOCARDIOGRAM LIMITED  Result Date: 06/08/2021    ECHOCARDIOGRAM REPORT   Patient Name:   MELL MELLOTT Figeroa Date of Exam: 06/08/2021 Medical Rec #:  [id number]      Height:       64.0 in Accession #:    [id number]     Weight:       179.9 lb Date of Birth:  Dec 03, 1960     BSA:          1.870 m Patient  Age:    33 years       BP:           104/61 mmHg Patient Gender: F              HR:           62 bpm. Exam Location:  Inpatient Procedure: Limited Echo and Intracardiac Opacification Agent Indications:    Stroke  History:        Patient has prior history of Echocardiogram examinations, most                 recent 05/19/2021. CAD, COPD; Risk Factors:Hypertension and                 Dyslipidemia. H/O LV thrombus.  Sonographer:    Merrie Roof RDCS Referring Phys: [id number] [place]  1. There is no left ventricular thrombus with Definity contrast. The left ventricle has mildly decreased function. There is moderate hypokinesis of the left ventricular, basal-mid inferior wall, inferoseptal wall and inferolateral  wall. 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 Comparison(s): A prior study was performed on 05/19/2021 and 05/16/2020. Prior images reviewed side by side. The left ventricular function is unchanged. The left ventricular wall motion abnormality is unchanged. The current study was limited to LV wall motion and systolic function assessment. The LV thrombus described in 2021 is no longer present. FINDINGS  Left Ventricle: There is no left ventricular thrombus with Definity contrast. The left ventricle has mildly decreased function. Moderate hypokinesis of the left ventricular, basal-mid inferior wall, inferoseptal wall and inferolateral wall. Definity contrast agent was given IV to delineate the left ventricular endocardial borders.  LV Wall Scoring: The posterior wall is hypokinetic. The entire anterior wall, antero-lateral wall, entire septum, entire apex, and entire inferior wall are normal. Findings suggest old infarction in the left circumflex coronary distribution. Sanda Klein MD Electronically signed by Sanda Klein MD Signature Date/Time: 06/08/2021/5:28:51 PM    Final    CT ANGIO HEAD NECK W WO CM W PERF (CODE STROKE)  Addendum Date: 06/08/2021   ADDENDUM REPORT: 06/08/2021 12:59 ADDENDUM: CTA neck impression #3 and CTA head impression #1 called by telephone at the time of interpretation on 06/08/2021 at 12:23 pm to provider Dr. Roslynn Amble, who verbally acknowledged these results. Electronically Signed   By: Kellie Simmering D.O.   On: 06/08/2021 12:59   Result Date: 06/08/2021 CLINICAL DATA:  Neuro deficit, acute, stroke suspected. Additional history provided: Aphasia. EXAM: CT ANGIOGRAPHY HEAD AND NECK CT PERFUSION BRAIN TECHNIQUE: Multidetector CT imaging of the head and neck was performed using the standard protocol during bolus administration of intravenous contrast. Multiplanar CT image reconstructions and MIPs were obtained to evaluate the vascular anatomy.  Carotid stenosis measurements (when applicable) are obtained utilizing NASCET criteria, using the distal internal carotid diameter as the denominator. Multiphase CT imaging of the brain was performed following IV bolus contrast injection. Subsequent parametric perfusion maps were calculated using RAPID software. CONTRAST:  188mL OMNIPAQUE IOHEXOL 350 MG/ML SOLN COMPARISON:  Noncontrast head CT performed earlier today 06/08/2021. FINDINGS: CTA NECK FINDINGS Aortic arch: Standard aortic branching. Soft and calcified plaque within the visualized aortic arch and proximal major branch vessels of the neck. Probable non-acute penetrating atherosclerotic ulceration and/or chronic focal dissection of the lateral arch (for instance as seen on series 7, images 304 and 305) (series 10, image 168). The arch is nonaneurysmal at this level, measuring 2.5 cm in diameter. No hemodynamically  significant innominate or proximal subclavian artery stenosis. Right carotid system: CCA and ICA patent within the neck without significant stenosis (50% or greater). Mild atherosclerotic plaque within the distal CCA, carotid bifurcation and proximal ICA. Left carotid system: CCA and ICA patent within the neck without significant stenosis (50% or greater). Mild to moderate predominantly calcified plaque within the distal CCA, carotid bifurcation and proximal ICA. Vertebral arteries: Streak and beam hardening artifact arising from a dense right-sided contrast bolus partially obscures the V1 right vertebral artery. Within this limitation, the vertebral arteries are patent within the neck without appreciable stenosis. Minimal nonstenotic calcified plaque within the distal cervical left vertebral artery. Skeleton: Cervical spondylosis. No acute bony abnormality or aggressive osseous lesion partially imaged thoracic levocurvature. Other neck: 11 mm right thyroid lobe nodule. The left thyroid lobe is poorly delineated, markedly atrophic or surgically  absent. Upper chest: The centrilobular and paraseptal emphysema. No consolidation within the imaged lung apices. Review of the MIP images confirms the above findings CTA HEAD FINDINGS Anterior circulation: The intracranial internal carotid arteries are patent. Mild calcified plaque within both vessels without stenosis. The M1 middle cerebral arteries are patent. No M2 proximal branch occlusion or high-grade proximal stenosis is identified. The anterior cerebral arteries are patent. No intracranial aneurysm is identified. Posterior circulation: The intracranial vertebral arteries are patent. The basilar artery is patent. The posterior cerebral arteries are patent. The posterior cerebral arteries are fetal in origin bilaterally. Venous sinuses: Within the limitations of contrast timing, no convincing thrombus. Anatomic variants: As described. Review of the MIP images confirms the above findings CT Brain Perfusion Findings: CBF (<30%) Volume: 94mL Perfusion (Tmax>6.0s) volume: 67mL (However, the perfusion software identifies a 13 mL region of hypoperfusion within the posterior left MCA territory utilizing the Tmax >4.0 seconds threshold). Mismatch Volume: 16mL Infarction Location:None identified IMPRESSION: CTA neck: 1. The common carotid and internal carotid arteries are patent within the neck without hemodynamically significant stenosis (50% or greater). Atherosclerotic plaque within the bilateral carotid systems within the neck, as described. 2. Streak and beam hardening artifact arising from a dense right-sided contrast bolus partially obscures the V1 right vertebral artery. Within this limitation, the vertebral arteries are patent within the neck without appreciable stenosis. 3. Likely non-acute penetrating atherosclerotic ulceration and/or chronic focal dissection within the lateral aortic arch. The aortic arch is nonaneurysmal at this level. 4. Aortic Atherosclerosis (ICD10-I70.0) and Emphysema (ICD10-J43.9). CTA  head: 1. No intracranial large vessel occlusion or proximal high-grade arterial stenosis is identified. 2. Calcified plaque within the intracranial internal carotid arteries with no more than mild stenosis. CT perfusion head: The perfusion software identifies no core infarct. The perfusion software identifies no hypoperfused parenchyma utilizing the Tmax>6 seconds threshold. However, the perfusion software identifies a 13 mL region of hypoperfusion within the posterior left MCA territory utilizing the Tmax >4.0 seconds threshold. Electronically Signed: By: Kellie Simmering D.O. On: 06/08/2021 12:23    PHYSICAL EXAM Pleasant frail middle-aged African-American lady not in distress. . Afebrile. Head is nontraumatic. Neck is supple without bruit.    Cardiac exam no murmur or gallop. Lungs are clear to auscultation. Distal pulses are well felt.   Neuro: Mental Status: Patient is awake, alert, oriented to person, month, and age  Patient is able to give some history but has mild expressive aphasia with word finding difficulties and speech hesitancy.  She has difficulty with repetition and intermittently has difficulty following some commands. No evidence of neglect Cranial Nerves: II: Visual Fields are full.  III,IV, VI: EOMI without ptosis or diploplia.  V: Facial sensation is symmetric to light touch VII: Facial movement is symmetric.  VIII: hearing is intact to voice X: Uvula elevates symmetrically XI: Shoulder shrug is symmetric. XII: tongue is midline without atrophy or fasciculations.  Motor: Tone is normal. Bulk is normal.  She does not have pronator drift of bilateral upper extremities and can maintain bilateral lower extremities antigravity Sensory: She is able to correctly report light touch on all 4 extremities and does not extinguish to double sided stimulus Deep Tendon Reflexes: 2+ and symmetric in the brachioradialis and patellae.  Cerebellar: FNF and HKS are intact bilaterally     ASSESSMENT/PLAN Jocelyn [name] is a 60 y.o. female with history of   hypertension, hyperlipidemia, hypothyroidism, prior LV thrombus, coronary artery disease, COPD, prior smoking (quit 1 month ago), aortic atherosclerosis, anxiety/depression.   She has been in her usual state of health and family last spoke to her at 28 PM at which time she was speaking normally.  When her daughter called her at 8 AM she was noted to have speech that did not make sense.  She therefore presented to the ED for evaluation.  Initial head CT was negative.  Once neurology was consulted, evaluation was expedited and given patient could potentially meet wake-up stroke criteria code stroke was activated by myself.  At baseline the patient lives independently.   And chart review regarding her LV thrombus, it appears that that this was felt to be in the setting of smoking and estrogen use, with the patient questioning a role of COVID-19 vaccination that she had received shortly before she was diagnosed.  She had been on warfarin but had been eager to discontinue this medication and it was discontinued with cessation of her estrogen and smoking cessation.  MRI brain shows left parietal and superior temporal lobe infarcts- left MCA territory.     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  However present 2D echo shows no definite thrombus despite patient being off anticoagulation    CT head No acute abnormality.  Small vessel disease. Atrophy.  ASPECTS 10.     CTA head  1. No intracranial large vessel occlusion or proximal high-grade arterial stenosis is identified. 2. Calcified plaque within the intracranial internal carotid arteries with no more than mild stenosis. CTA neck    1. The common carotid and internal carotid arteries are patent within the neck without hemodynamically significant stenosis (50% or greater).  Atherosclerotic plaque within the bilateral carotid systems within the neck, as described. 2. Streak and beam hardening artifact arising from a dense right-sided contrast bolus partially obscures the V1 right vertebral artery. Within this limitation, the vertebral arteries are patent within the neck without appreciable stenosis. 3. Likely non-acute penetrating atherosclerotic ulceration and/or chronic focal dissection within the lateral aortic arch. The aortic arch is nonaneurysmal at this level. 4. Aortic Atherosclerosis (ICD10-I70.0) and Emphysema (ICD10-J43.9).   CT perfusion   The perfusion software identifies no core infarct. The perfusion software identifies no hypoperfused parenchyma utilizing the Tmax>6 seconds threshold. However, the perfusion software identifies a 13 mL region of hypoperfusion within the posterior left MCA territory utilizing the Tmax >4.0 seconds threshold.    MRI  BRAIN: 1. Small acute cortical infarcts in the left parietal and superior temporal lobe, likely distal left MCA territory. Faint edema without mass effect. 2. Mild chronic microvascular disease.  2D Echo  There is no left ventricular thrombus with Definity contrast. The left  ventricle has mildly decreased function. The LV thrombus described in 2021  is no longer present.    LDL 112 HgbA1c 6.2 VTE prophylaxis - scd    Diet   Diet Heart Room service appropriate? Yes; Fluid consistency: Thin   aspirin 81 mg daily prior to admission, now on aspirin 81 mg and Plavix 75 mg daily.  For 3 weeks followed by Plavix alone Therapy recommendations:  pending Disposition:  pending  Hypertension Home meds:  lasix, imdur, metoprolol Stable Permissive hypertension (OK if < 220/120) but gradually normalize in 5-7 days Long-term BP goal normotensive  Hyperlipidemia Home meds:  crestor 20mg ,  resumed in hospital LDL 112, goal < 70  Continue statin at discharge  HgbA1c 6.2, goal < 7.0 CBGs Recent  Labs    06/08/21 0937  GLUCAP 104*    SSI  Other Stroke Risk Factors  CAD  Cigarette smoker advised to stop smoking  Chronic combined Congestive heart failure     Obesity, Body mass index is 30.88 kg/m., BMI >/= 30 associated with increased stroke risk, recommend weight loss, diet and exercise as appropriate   History of LV thrombus off anticoagulation   Other Active Problems Anxiety/depression hypothyroidism  Hospital day # 0 I have personally obtained history,examined this patient, reviewed notes, independently viewed imaging studies, participated in medical decision making and plan of care.ROS completed by me personally and pertinent positives fully documented  I have made any additions or clarifications directly to the above note. Agree with note above.  Patient presented with expressive aphasia due to embolic left temporoparietal cortical infarct.  She had history of left ventricular thrombus last year for which she was on transient anticoagulation which has been discontinued but 2D echo this admission shows resolution of the thrombus.  Recommend further evaluation by checking TEE to look for cardiac thrombus and if unyielding may need loop recorder at discharge for paroxysmal A. fib.  Check transcranial Doppler bubble study for PFO and will extremity venous Dopplers for DVT.  Recommend aspirin and Plavix for 3 weeks followed by Plavix alone and aggressive risk factor modification.  Check urine drug screen and anticardiolipin antibodies.  Long discussion with patient and also spoke to 2 of her daughters over the phone and answered questions.  Discussed with Dr. [place] Pink.  Greater than 50% time during this 35-minute visit was spent in counseling and coordination of care and discussion with care team and answering questions embolic stroke  Antony Contras, [place] Pager: [phone] 06/09/2021 4:28 PM     To contact Stroke Continuity provider,  please refer to [email or web address] After hours, contact General Neurology

## 2021-06-09 NOTE — H&P (View-Only) (Signed)
STROKE TEAM PROGRESS NOTE   INTERVAL HISTORY Her daughter is at the bedside.  Other daughter was called during our examination.  Patient states her speech and language abilities appear to be improving.  MRI scan shows cortical left temporoparietal embolic infarct.  CT angiogram of brain and neck did not show significant large vessel stenosis or occlusion.  Hemoglobin A1c 6.2.  LDL cholesterol 112 mg percent.  Transthoracic echo shows no clot normal ejection fraction. Vitals:   06/08/21 2030 06/09/21 0032 06/09/21 0405 06/09/21 0811  BP: (!) 98/51 (!) 102/59 107/62 105/69  Pulse: 69 (!) 57 63 (!) 53  Resp: 16 13 18 17   Temp: 97.6 F (36.4 C) 98 F (36.7 C) (!) 97.5 F (36.4 C) 98.2 F (36.8 C)  TempSrc: Oral Oral Oral   SpO2: 96% 98% 96% 94%  Weight:      Height:       CBC:  Recent Labs  Lab 06/08/21 0925 06/08/21 0937  WBC 5.7  --   NEUTROABS 2.5  --   HGB 12.8 12.6  HCT 38.0 37.0  MCV 88.6  --   PLT 267  --    Basic Metabolic Panel:  Recent Labs  Lab 06/08/21 0925 06/08/21 0937 06/09/21 0355  NA 136 139 137  K 4.2 4.1 4.2  CL 100 101 102  CO2 25  --  25  GLUCOSE 97 98 101*  BUN 18 18 19   CREATININE 1.30* 1.40* 1.17*  CALCIUM 9.3  --  9.2    Lipid Panel:  Recent Labs  Lab 06/09/21 0355  CHOL 178  TRIG 98  HDL 46  CHOLHDL 3.9  VLDL 20  LDLCALC 112*    HgbA1c:  Recent Labs  Lab 06/09/21 0355  HGBA1C 6.2*   Urine Drug Screen: No results for input(s): LABOPIA, COCAINSCRNUR, LABBENZ, AMPHETMU, THCU, LABBARB in the last 168 hours.  Alcohol Level No results for input(s): ETH in the last 168 hours.  IMAGING past 24 hours MR BRAIN WO CONTRAST  Result Date: 06/08/2021 CLINICAL DATA:  Neuro deficit, acute, stroke suspected EXAM: MRI HEAD WITHOUT CONTRAST TECHNIQUE: Multiplanar, multiecho pulse sequences of the brain and surrounding structures were obtained without intravenous contrast. COMPARISON:  Same day CT/CTA. FINDINGS: Brain: Small acute cortical  infarcts in the left parietal and superior temporal lobe, likely distal left MCA territory. Faint edema without mass effect. No midline shift. Basal cisterns are patent. Otherwise, mild scattered T2 hyperintensities in the white matter, nonspecific but compatible with chronic microvascular ischemic disease. No hydrocephalus, mass lesion, extra-axial fluid collection, or acute hemorrhage. Vascular: Better evaluated on concurrent CTA. Skull and upper cervical spine: Heterogeneous marrow without focal suspicious lesion. Sinuses/Orbits: Minimal paranasal sinus mucosal thickening. Unremarkable orbits. Other: No sizable mastoid effusions. IMPRESSION: 1. Small acute cortical infarcts in the left parietal and superior temporal lobe, likely distal left MCA territory. Faint edema without mass effect. 2. Mild chronic microvascular disease. Electronically Signed   By: Margaretha Sheffield M.D.   On: 06/08/2021 12:20   ECHOCARDIOGRAM LIMITED  Result Date: 06/08/2021    ECHOCARDIOGRAM REPORT   Patient Name:   Jocelyn [name] Date of Exam: 06/08/2021 Medical Rec #:  [id number]      Height:       64.0 in Accession #:    [id number]     Weight:       179.9 lb Date of Birth:  1961-02-02     BSA:          1.870 m Patient  Age:    60 years       BP:           104/61 mmHg Patient Gender: F              HR:           62 bpm. Exam Location:  Inpatient Procedure: Limited Echo and Intracardiac Opacification Agent Indications:    Stroke  History:        Patient has prior history of Echocardiogram examinations, most                 recent 05/19/2021. CAD, COPD; Risk Factors:Hypertension and                 Dyslipidemia. H/O LV thrombus.  Sonographer:    Merrie Roof RDCS Referring Phys: [id number] [place]  1. There is no left ventricular thrombus with Definity contrast. The left ventricle has mildly decreased function. There is moderate hypokinesis of the left ventricular, basal-mid inferior wall, inferoseptal wall and inferolateral  wall. 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 Comparison(s): A prior study was performed on 05/19/2021 and 05/16/2020. Prior images reviewed side by side. The left ventricular function is unchanged. The left ventricular wall motion abnormality is unchanged. The current study was limited to LV wall motion and systolic function assessment. The LV thrombus described in 2021 is no longer present. FINDINGS  Left Ventricle: There is no left ventricular thrombus with Definity contrast. The left ventricle has mildly decreased function. Moderate hypokinesis of the left ventricular, basal-mid inferior wall, inferoseptal wall and inferolateral wall. Definity contrast agent was given IV to delineate the left ventricular endocardial borders.  LV Wall Scoring: The posterior wall is hypokinetic. The entire anterior wall, antero-lateral wall, entire septum, entire apex, and entire inferior wall are normal. Findings suggest old infarction in the left circumflex coronary distribution. Sanda Klein MD Electronically signed by Sanda Klein MD Signature Date/Time: 06/08/2021/5:28:51 PM    Final    CT ANGIO HEAD NECK W WO CM W PERF (CODE STROKE)  Addendum Date: 06/08/2021   ADDENDUM REPORT: 06/08/2021 12:59 ADDENDUM: CTA neck impression #3 and CTA head impression #1 called by telephone at the time of interpretation on 06/08/2021 at 12:23 pm to provider Dr. Roslynn Amble, who verbally acknowledged these results. Electronically Signed   By: Kellie Simmering D.O.   On: 06/08/2021 12:59   Result Date: 06/08/2021 CLINICAL DATA:  Neuro deficit, acute, stroke suspected. Additional history provided: Aphasia. EXAM: CT ANGIOGRAPHY HEAD AND NECK CT PERFUSION BRAIN TECHNIQUE: Multidetector CT imaging of the head and neck was performed using the standard protocol during bolus administration of intravenous contrast. Multiplanar CT image reconstructions and MIPs were obtained to evaluate the vascular anatomy.  Carotid stenosis measurements (when applicable) are obtained utilizing NASCET criteria, using the distal internal carotid diameter as the denominator. Multiphase CT imaging of the brain was performed following IV bolus contrast injection. Subsequent parametric perfusion maps were calculated using RAPID software. CONTRAST:  170mL OMNIPAQUE IOHEXOL 350 MG/ML SOLN COMPARISON:  Noncontrast head CT performed earlier today 06/08/2021. FINDINGS: CTA NECK FINDINGS Aortic arch: Standard aortic branching. Soft and calcified plaque within the visualized aortic arch and proximal major branch vessels of the neck. Probable non-acute penetrating atherosclerotic ulceration and/or chronic focal dissection of the lateral arch (for instance as seen on series 7, images 304 and 305) (series 10, image 168). The arch is nonaneurysmal at this level, measuring 2.5 cm in diameter. No hemodynamically  significant innominate or proximal subclavian artery stenosis. Right carotid system: CCA and ICA patent within the neck without significant stenosis (50% or greater). Mild atherosclerotic plaque within the distal CCA, carotid bifurcation and proximal ICA. Left carotid system: CCA and ICA patent within the neck without significant stenosis (50% or greater). Mild to moderate predominantly calcified plaque within the distal CCA, carotid bifurcation and proximal ICA. Vertebral arteries: Streak and beam hardening artifact arising from a dense right-sided contrast bolus partially obscures the V1 right vertebral artery. Within this limitation, the vertebral arteries are patent within the neck without appreciable stenosis. Minimal nonstenotic calcified plaque within the distal cervical left vertebral artery. Skeleton: Cervical spondylosis. No acute bony abnormality or aggressive osseous lesion partially imaged thoracic levocurvature. Other neck: 11 mm right thyroid lobe nodule. The left thyroid lobe is poorly delineated, markedly atrophic or surgically  absent. Upper chest: The centrilobular and paraseptal emphysema. No consolidation within the imaged lung apices. Review of the MIP images confirms the above findings CTA HEAD FINDINGS Anterior circulation: The intracranial internal carotid arteries are patent. Mild calcified plaque within both vessels without stenosis. The M1 middle cerebral arteries are patent. No M2 proximal branch occlusion or high-grade proximal stenosis is identified. The anterior cerebral arteries are patent. No intracranial aneurysm is identified. Posterior circulation: The intracranial vertebral arteries are patent. The basilar artery is patent. The posterior cerebral arteries are patent. The posterior cerebral arteries are fetal in origin bilaterally. Venous sinuses: Within the limitations of contrast timing, no convincing thrombus. Anatomic variants: As described. Review of the MIP images confirms the above findings CT Brain Perfusion Findings: CBF (<30%) Volume: 25mL Perfusion (Tmax>6.0s) volume: 109mL (However, the perfusion software identifies a 13 mL region of hypoperfusion within the posterior left MCA territory utilizing the Tmax >4.0 seconds threshold). Mismatch Volume: 41mL Infarction Location:None identified IMPRESSION: CTA neck: 1. The common carotid and internal carotid arteries are patent within the neck without hemodynamically significant stenosis (50% or greater). Atherosclerotic plaque within the bilateral carotid systems within the neck, as described. 2. Streak and beam hardening artifact arising from a dense right-sided contrast bolus partially obscures the V1 right vertebral artery. Within this limitation, the vertebral arteries are patent within the neck without appreciable stenosis. 3. Likely non-acute penetrating atherosclerotic ulceration and/or chronic focal dissection within the lateral aortic arch. The aortic arch is nonaneurysmal at this level. 4. Aortic Atherosclerosis (ICD10-I70.0) and Emphysema (ICD10-J43.9). CTA  head: 1. No intracranial large vessel occlusion or proximal high-grade arterial stenosis is identified. 2. Calcified plaque within the intracranial internal carotid arteries with no more than mild stenosis. CT perfusion head: The perfusion software identifies no core infarct. The perfusion software identifies no hypoperfused parenchyma utilizing the Tmax>6 seconds threshold. However, the perfusion software identifies a 13 mL region of hypoperfusion within the posterior left MCA territory utilizing the Tmax >4.0 seconds threshold. Electronically Signed: By: Kellie Simmering D.O. On: 06/08/2021 12:23    PHYSICAL EXAM Pleasant frail middle-aged African-American lady not in distress. . Afebrile. Head is nontraumatic. Neck is supple without bruit.    Cardiac exam no murmur or gallop. Lungs are clear to auscultation. Distal pulses are well felt.   Neuro: Mental Status: Patient is awake, alert, oriented to person, month, and age  Patient is able to give some history but has mild expressive aphasia with word finding difficulties and speech hesitancy.  She has difficulty with repetition and intermittently has difficulty following some commands. No evidence of neglect Cranial Nerves: II: Visual Fields are full.  III,IV, VI: EOMI without ptosis or diploplia.  V: Facial sensation is symmetric to light touch VII: Facial movement is symmetric.  VIII: hearing is intact to voice X: Uvula elevates symmetrically XI: Shoulder shrug is symmetric. XII: tongue is midline without atrophy or fasciculations.  Motor: Tone is normal. Bulk is normal.  She does not have pronator drift of bilateral upper extremities and can maintain bilateral lower extremities antigravity Sensory: She is able to correctly report light touch on all 4 extremities and does not extinguish to double sided stimulus Deep Tendon Reflexes: 2+ and symmetric in the brachioradialis and patellae.  Cerebellar: FNF and HKS are intact bilaterally     ASSESSMENT/PLAN Jocelyn [name] is a 60 y.o. female with history of   hypertension, hyperlipidemia, hypothyroidism, prior LV thrombus, coronary artery disease, COPD, prior smoking (quit 1 month ago), aortic atherosclerosis, anxiety/depression.   She has been in her usual state of health and family last spoke to her at 35 PM at which time she was speaking normally.  When her daughter called her at 8 AM she was noted to have speech that did not make sense.  She therefore presented to the ED for evaluation.  Initial head CT was negative.  Once neurology was consulted, evaluation was expedited and given patient could potentially meet wake-up stroke criteria code stroke was activated by myself.  At baseline the patient lives independently.   And chart review regarding her LV thrombus, it appears that that this was felt to be in the setting of smoking and estrogen use, with the patient questioning a role of COVID-19 vaccination that she had received shortly before she was diagnosed.  She had been on warfarin but had been eager to discontinue this medication and it was discontinued with cessation of her estrogen and smoking cessation.  MRI brain shows left parietal and superior temporal lobe infarcts- left MCA territory.     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  However present 2D echo shows no definite thrombus despite patient being off anticoagulation    CT head No acute abnormality.  Small vessel disease. Atrophy.  ASPECTS 10.     CTA head  1. No intracranial large vessel occlusion or proximal high-grade arterial stenosis is identified. 2. Calcified plaque within the intracranial internal carotid arteries with no more than mild stenosis. CTA neck    1. The common carotid and internal carotid arteries are patent within the neck without hemodynamically significant stenosis (50% or greater).  Atherosclerotic plaque within the bilateral carotid systems within the neck, as described. 2. Streak and beam hardening artifact arising from a dense right-sided contrast bolus partially obscures the V1 right vertebral artery. Within this limitation, the vertebral arteries are patent within the neck without appreciable stenosis. 3. Likely non-acute penetrating atherosclerotic ulceration and/or chronic focal dissection within the lateral aortic arch. The aortic arch is nonaneurysmal at this level. 4. Aortic Atherosclerosis (ICD10-I70.0) and Emphysema (ICD10-J43.9).   CT perfusion   The perfusion software identifies no core infarct. The perfusion software identifies no hypoperfused parenchyma utilizing the Tmax>6 seconds threshold. However, the perfusion software identifies a 13 mL region of hypoperfusion within the posterior left MCA territory utilizing the Tmax >4.0 seconds threshold.    MRI  BRAIN: 1. Small acute cortical infarcts in the left parietal and superior temporal lobe, likely distal left MCA territory. Faint edema without mass effect. 2. Mild chronic microvascular disease.  2D Echo  There is no left ventricular thrombus with Definity contrast. The left  ventricle has mildly decreased function. The LV thrombus described in 2021  is no longer present.    LDL 112 HgbA1c 6.2 VTE prophylaxis - scd    Diet   Diet Heart Room service appropriate? Yes; Fluid consistency: Thin   aspirin 81 mg daily prior to admission, now on aspirin 81 mg and Plavix 75 mg daily.  For 3 weeks followed by Plavix alone Therapy recommendations:  pending Disposition:  pending  Hypertension Home meds:  lasix, imdur, metoprolol Stable Permissive hypertension (OK if < 220/120) but gradually normalize in 5-7 days Long-term BP goal normotensive  Hyperlipidemia Home meds:  crestor 20mg ,  resumed in hospital LDL 112, goal < 70  Continue statin at discharge  HgbA1c 6.2, goal < 7.0 CBGs Recent  Labs    06/08/21 0937  GLUCAP 104*    SSI  Other Stroke Risk Factors  CAD  Cigarette smoker advised to stop smoking  Chronic combined Congestive heart failure     Obesity, Body mass index is 30.88 kg/m., BMI >/= 30 associated with increased stroke risk, recommend weight loss, diet and exercise as appropriate   History of LV thrombus off anticoagulation   Other Active Problems Anxiety/depression hypothyroidism  Hospital day # 0 I have personally obtained history,examined this patient, reviewed notes, independently viewed imaging studies, participated in medical decision making and plan of care.ROS completed by me personally and pertinent positives fully documented  I have made any additions or clarifications directly to the above note. Agree with note above.  Patient presented with expressive aphasia due to embolic left temporoparietal cortical infarct.  She had history of left ventricular thrombus last year for which she was on transient anticoagulation which has been discontinued but 2D echo this admission shows resolution of the thrombus.  Recommend further evaluation by checking TEE to look for cardiac thrombus and if unyielding may need loop recorder at discharge for paroxysmal A. fib.  Check transcranial Doppler bubble study for PFO and will extremity venous Dopplers for DVT.  Recommend aspirin and Plavix for 3 weeks followed by Plavix alone and aggressive risk factor modification.  Check urine drug screen and anticardiolipin antibodies.  Long discussion with patient and also spoke to 2 of her daughters over the phone and answered questions.  Discussed with Dr. [place] Pink.  Greater than 50% time during this 35-minute visit was spent in counseling and coordination of care and discussion with care team and answering questions embolic stroke  Antony Contras, Leon Pager: [phone] 06/09/2021 4:28 PM     To contact Stroke Continuity provider,  please refer to [email or web address] After hours, contact General Neurology

## 2021-06-09 NOTE — Progress Notes (Signed)
[facility] PROGRESS NOTE   Jocelyn CRISCIONE KWI:[id number] DOB: February 05, 1961 DOA: 06/08/2021  PCP: Benito Mccreedy, MD  Brief History/Interval Summary: 60 y.o. 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  Speech was slurred and she found it difficult to speak movements.  MRI showed left brain infarct.  Patient was hospitalized for further management.  Reason for Visit: Acute stroke  Consultants:  Neurology Vascular surgery  Procedures: Transthoracic echocardiogram (see report below)    Subjective/Interval History: Patient mentions that her speech is improving.  Denies any weakness on any 1 side of her body.  Denies any visual disturbances.  No nausea vomiting chest pain or shortness of breath.     Assessment/Plan:  Acute stroke Involvement of the left hemisphere.  Presented with speech difficulties.  Did not have any motor deficits. Neurology is following.  PT and OT evaluation.  Echocardiogram as below.  She would diminished systolic function which is known per previous history.  EF was 40 to 45% in October. LDL is 112.  HbA1c 6.2.  Currently on aspirin. No significant stenosis noted on CT angiogram. Discussed with neurology.  Bubble study was positive suggesting PFO.  Plan is for TEE and lower extremity Doppler studies.  Abnormal appearing aortic arch with concern for ulceration Seen by vascular surgery.  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  She will need a repeat CT chest in 6 months.  Outpatient follow-up will be arranged.  History of LV thrombus This was in 2021.  She was treated with  warfarin for 3 months. Current echocardiogram does not show any LV thrombus.  Essential hypertension Permissive hypertension.  History of coronary artery disease Stable.  Denies any chest pain.  History of COPD Stable.  No wheezing appreciated.  Hypothyroidism Stable.  Continue levothyroxine.  Anxiety and depression Stable.  Continue home medications  Obesity Estimated body mass index is 30.88 kg/m as calculated from the following:   Height as of this encounter: 5\' 4"  (1.626 m).   Weight as of this encounter: 81.6 kg.   DVT Prophylaxis: Lovenox Code Status: Full code Family Communication: Discussed with patient Disposition Plan: To be determined  Status is: Observation  The patient will require care spanning > 2 midnights and should be moved to inpatient because: she needs further work up for stroke.     Medications: Scheduled:  aspirin  325 mg Oral Daily   enoxaparin (LOVENOX) injection  40 mg Subcutaneous Q24H   levothyroxine  112 mcg Oral q morning   linaclotide  145 mcg Oral Daily   rosuvastatin  20 mg Oral Daily   Continuous: MEQ:[id number] **OR** acetaminophen (TYLENOL) oral liquid 160 mg/5 mL **OR** acetaminophen, albuterol, ALPRAZolam, cyclobenzaprine, docusate sodium, gabapentin, HYDROcodone-acetaminophen, ipratropium-albuterol, senna-docusate  Antibiotics: Anti-infectives (From admission, onward)    None       Objective:  Vital Signs  Vitals:   06/09/21 0032 06/09/21 0405 06/09/21 0811 06/09/21 1222  BP: (!) 102/59 107/62 105/69 112/76  Pulse: (!) 57 63 (!) 53 66  Resp: 13 18 17 16   Temp: 98 F (36.7 C) (!) 97.5 F (36.4 C) 98.2 F (36.8 C) 97.8 F (36.6 C)  TempSrc: Oral Oral  SpO2: 98% 96% 94% 96%  Weight:      Height:        Intake/Output Summary (Last 24 hours) at 06/09/2021 1239 Last data filed at 06/09/2021 0900 Gross per 24 hour  Intake 1460 ml  Output 800 ml  Net 660 ml   Filed Weights   06/08/21 1100 06/08/21  1255  Weight: 81.6 kg 81.6 kg    General appearance: Awake alert.  In no distress Resp: Clear to auscultation bilaterally.  Normal effort Cardio: S1-S2 is normal regular.  No S3-S4.  No rubs murmurs or bruit GI: Abdomen is soft.  Nontender nondistended.  Bowel sounds are present normal.  No masses organomegaly Extremities: No edema.  Full range of motion of lower extremities. Neurologic: Alert and oriented x3.  No focal neurological deficits.    Lab Results:  Data Reviewed: I have personally reviewed following labs and imaging studies  CBC: Recent Labs  Lab 06/08/21 0925 06/08/21 0937  WBC 5.7  --   NEUTROABS 2.5  --   HGB 12.8 12.6  HCT 38.0 37.0  MCV 88.6  --   PLT 267  --     Basic Metabolic Panel: Recent Labs  Lab 06/08/21 0925 06/08/21 0937 06/09/21 0355  NA 136 139 137  K 4.2 4.1 4.2  CL 100 101 102  CO2 25  --  25  GLUCOSE 97 98 101*  BUN 18 18 19   CREATININE 1.30* 1.40* 1.17*  CALCIUM 9.3  --  9.2    GFR: Estimated Creatinine Clearance: 53.5 mL/min (A) (by C-G formula based on SCr of 1.17 mg/dL (H)).  Liver Function Tests: Recent Labs  Lab 06/08/21 0925  AST 20  ALT 30  ALKPHOS 61  BILITOT 0.9  PROT 6.4*  ALBUMIN 3.0*      Coagulation Profile: Recent Labs  Lab 06/08/21 0925  INR 1.1     HbA1C: Recent Labs    06/09/21 0355  HGBA1C 6.2*    CBG: Recent Labs  Lab 06/08/21 0937  GLUCAP 104*    Lipid Profile: Recent Labs    06/09/21 0355  CHOL 178  HDL 46  LDLCALC 112*  TRIG 98  CHOLHDL 3.9    Recent Results (from the past 240 hour(s))  Resp Panel by RT-PCR (Flu A&B, Covid) Nasopharyngeal Swab     Status: None   Collection Time: 06/08/21  1:50 PM   Specimen: Nasopharyngeal Swab; Nasopharyngeal(NP) swabs in vial transport medium  Result Value Ref Range Status   SARS Coronavirus 2 by RT PCR NEGATIVE NEGATIVE Final    Comment: (NOTE) SARS-CoV-2 target nucleic acids are NOT DETECTED.  The SARS-CoV-2 RNA is generally  detectable in upper respiratory specimens during the acute phase of infection. The lowest concentration of SARS-CoV-2 viral copies this assay can detect is 138 copies/mL. A negative result does not preclude SARS-Cov-2 infection and should not be used as the sole basis for treatment or other patient management decisions. A negative result may occur with  improper specimen collection/handling, submission of specimen other than nasopharyngeal swab, presence of viral mutation(s) within the areas targeted by this assay, and inadequate number of viral copies(<138 copies/mL). A negative result must be combined with clinical observations, patient history, and epidemiological information. The expected result is Negative.  Fact Sheet for Patients:  EntrepreneurPulse.com.au  Fact Sheet for Healthcare Providers:  IncredibleEmployment.be  This test is no t yet approved or cleared by the [place] FDA and  has been authorized for detection and/or diagnosis of  SARS-CoV-2 by FDA under an Emergency Use Authorization (EUA). This EUA will remain  in effect (meaning this test can be used) for the duration of the COVID-19 declaration under Section 564(b)(1) of the Act, 21 U.S.C.section 360bbb-3(b)(1), unless the authorization is terminated  or revoked sooner.       Influenza A by PCR NEGATIVE NEGATIVE Final   Influenza B by PCR NEGATIVE NEGATIVE Final    Comment: (NOTE) The Xpert Xpress SARS-CoV-2/FLU/RSV plus assay is intended as an aid in the diagnosis of influenza from Nasopharyngeal swab specimens and should not be used as a sole basis for treatment. Nasal washings and aspirates are unacceptable for Xpert Xpress SARS-CoV-2/FLU/RSV testing.  Fact Sheet for Patients: EntrepreneurPulse.com.au  Fact Sheet for Healthcare Providers: IncredibleEmployment.be  This test is not yet approved or cleared by the [place] FDA  and has been authorized for detection and/or diagnosis of SARS-CoV-2 by FDA under an Emergency Use Authorization (EUA). This EUA will remain in effect (meaning this test can be used) for the duration of the COVID-19 declaration under Section 564(b)(1) of the Act, 21 U.S.C. section 360bbb-3(b)(1), unless the authorization is terminated or revoked.  Performed at [facility], [place] [address], [place], [place] [address]       Radiology Studies: CT HEAD WO CONTRAST  Result Date: 06/08/2021 CLINICAL DATA:  Neuro deficit, acute, stroke suspected EXAM: CT HEAD WITHOUT CONTRAST TECHNIQUE: Contiguous axial images were obtained from the base of the skull through the vertex without intravenous contrast. COMPARISON:  08/20/2016. FINDINGS: Brain: No evidence of acute infarction, hemorrhage, hydrocephalus, extra-axial collection or mass lesion/mass effect. Vascular: No hyperdense vessel identified. Calcific intracranial atherosclerosis. Skull: No evidence of acute fracture. Sinuses/Orbits: Mild paranasal sinus mucosal thickening. Unremarkable orbits. Other: No mastoid effusions. IMPRESSION: No evidence of acute intracranial abnormality. Electronically Signed   By: Margaretha Sheffield M.D.   On: 06/08/2021 10:11   MR BRAIN WO CONTRAST  Result Date: 06/08/2021 CLINICAL DATA:  Neuro deficit, acute, stroke suspected EXAM: MRI HEAD WITHOUT CONTRAST TECHNIQUE: Multiplanar, multiecho pulse sequences of the brain and surrounding structures were obtained without intravenous contrast. COMPARISON:  Same day CT/CTA. FINDINGS: Brain: Small acute cortical infarcts in the left parietal and superior temporal lobe, likely distal left MCA territory. Faint edema without mass effect. No midline shift. Basal cisterns are patent. Otherwise, mild scattered T2 hyperintensities in the white matter, nonspecific but compatible with chronic microvascular ischemic disease. No hydrocephalus, mass lesion, extra-axial fluid collection,  or acute hemorrhage. Vascular: Better evaluated on concurrent CTA. Skull and upper cervical spine: Heterogeneous marrow without focal suspicious lesion. Sinuses/Orbits: Minimal paranasal sinus mucosal thickening. Unremarkable orbits. Other: No sizable mastoid effusions. IMPRESSION: 1. Small acute cortical infarcts in the left parietal and superior temporal lobe, likely distal left MCA territory. Faint edema without mass effect. 2. Mild chronic microvascular disease. Electronically Signed   By: Margaretha Sheffield M.D.   On: 06/08/2021 12:20   ECHOCARDIOGRAM LIMITED  Result Date: 06/08/2021    ECHOCARDIOGRAM REPORT   Patient Name:   Jocelyn [name] Date of Exam: 06/08/2021 Medical Rec #:  [id number]      Height:       64.0 in Accession #:    [id number]     Weight:       179.9 lb Date of Birth:  1961/07/07     BSA:          1.870 m Patient Age:    66 years       BP:  104/61 mmHg Patient Gender: F              HR:           62 bpm. Exam Location:  Inpatient Procedure: Limited Echo and Intracardiac Opacification Agent Indications:    Stroke  History:        Patient has prior history of Echocardiogram examinations, most                 recent 05/19/2021. CAD, COPD; Risk Factors:Hypertension and                 Dyslipidemia. H/O LV thrombus.  Sonographer:    Merrie Roof RDCS Referring Phys: [id number] [place]  1. There is no left ventricular thrombus with Definity contrast. The left ventricle has mildly decreased function. There is moderate hypokinesis of the left ventricular, basal-mid inferior wall, inferoseptal wall and inferolateral wall. 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 Comparison(s): A prior study was performed on 05/19/2021 and 05/16/2020. Prior images reviewed side by side. The left ventricular function is unchanged. The left ventricular wall motion abnormality is unchanged. The current study was limited to LV wall motion and systolic function  assessment. The LV thrombus described in 2021 is no longer present. FINDINGS  Left Ventricle: There is no left ventricular thrombus with Definity contrast. The left ventricle has mildly decreased function. Moderate hypokinesis of the left ventricular, basal-mid inferior wall, inferoseptal wall and inferolateral wall. Definity contrast agent was given IV to delineate the left ventricular endocardial borders.  LV Wall Scoring: The posterior wall is hypokinetic. The entire anterior wall, antero-lateral wall, entire septum, entire apex, and entire inferior wall are normal. Findings suggest old infarction in the left circumflex coronary distribution. Sanda Klein MD Electronically signed by Sanda Klein MD Signature Date/Time: 06/08/2021/5:28:51 PM    Final    CT ANGIO HEAD NECK W WO CM W PERF (CODE STROKE)  Addendum Date: 06/08/2021   ADDENDUM REPORT: 06/08/2021 12:59 ADDENDUM: CTA neck impression #3 and CTA head impression #1 called by telephone at the time of interpretation on 06/08/2021 at 12:23 pm to provider Dr. Roslynn Amble, who verbally acknowledged these results. Electronically Signed   By: Kellie Simmering D.O.   On: 06/08/2021 12:59   Result Date: 06/08/2021 CLINICAL DATA:  Neuro deficit, acute, stroke suspected. Additional history provided: Aphasia. EXAM: CT ANGIOGRAPHY HEAD AND NECK CT PERFUSION BRAIN TECHNIQUE: Multidetector CT imaging of the head and neck was performed using the standard protocol during bolus administration of intravenous contrast. Multiplanar CT image reconstructions and MIPs were obtained to evaluate the vascular anatomy. Carotid stenosis measurements (when applicable) are obtained utilizing NASCET criteria, using the distal internal carotid diameter as the denominator. Multiphase CT imaging of the brain was performed following IV bolus contrast injection. Subsequent parametric perfusion maps were calculated using RAPID software. CONTRAST:  129mL OMNIPAQUE IOHEXOL 350 MG/ML SOLN  COMPARISON:  Noncontrast head CT performed earlier today 06/08/2021. FINDINGS: CTA NECK FINDINGS Aortic arch: Standard aortic branching. Soft and calcified plaque within the visualized aortic arch and proximal major branch vessels of the neck. Probable non-acute penetrating atherosclerotic ulceration and/or chronic focal dissection of the lateral arch (for instance as seen on series 7, images 304 and 305) (series 10, image 168). The arch is nonaneurysmal at this level, measuring 2.5 cm in diameter. No hemodynamically significant innominate or proximal subclavian artery stenosis. Right carotid system: CCA and ICA patent within the neck without significant stenosis (50% or greater).  Mild atherosclerotic plaque within the distal CCA, carotid bifurcation and proximal ICA. Left carotid system: CCA and ICA patent within the neck without significant stenosis (50% or greater). Mild to moderate predominantly calcified plaque within the distal CCA, carotid bifurcation and proximal ICA. Vertebral arteries: Streak and beam hardening artifact arising from a dense right-sided contrast bolus partially obscures the V1 right vertebral artery. Within this limitation, the vertebral arteries are patent within the neck without appreciable stenosis. Minimal nonstenotic calcified plaque within the distal cervical left vertebral artery. Skeleton: Cervical spondylosis. No acute bony abnormality or aggressive osseous lesion partially imaged thoracic levocurvature. Other neck: 11 mm right thyroid lobe nodule. The left thyroid lobe is poorly delineated, markedly atrophic or surgically absent. Upper chest: The centrilobular and paraseptal emphysema. No consolidation within the imaged lung apices. Review of the MIP images confirms the above findings CTA HEAD FINDINGS Anterior circulation: The intracranial internal carotid arteries are patent. Mild calcified plaque within both vessels without stenosis. The M1 middle cerebral arteries are patent.  No M2 proximal branch occlusion or high-grade proximal stenosis is identified. The anterior cerebral arteries are patent. No intracranial aneurysm is identified. Posterior circulation: The intracranial vertebral arteries are patent. The basilar artery is patent. The posterior cerebral arteries are patent. The posterior cerebral arteries are fetal in origin bilaterally. Venous sinuses: Within the limitations of contrast timing, no convincing thrombus. Anatomic variants: As described. Review of the MIP images confirms the above findings CT Brain Perfusion Findings: CBF (<30%) Volume: 20mL Perfusion (Tmax>6.0s) volume: 35mL (However, the perfusion software identifies a 13 mL region of hypoperfusion within the posterior left MCA territory utilizing the Tmax >4.0 seconds threshold). Mismatch Volume: 80mL Infarction Location:None identified IMPRESSION: CTA neck: 1. The common carotid and internal carotid arteries are patent within the neck without hemodynamically significant stenosis (50% or greater). Atherosclerotic plaque within the bilateral carotid systems within the neck, as described. 2. Streak and beam hardening artifact arising from a dense right-sided contrast bolus partially obscures the V1 right vertebral artery. Within this limitation, the vertebral arteries are patent within the neck without appreciable stenosis. 3. Likely non-acute penetrating atherosclerotic ulceration and/or chronic focal dissection within the lateral aortic arch. The aortic arch is nonaneurysmal at this level. 4. Aortic Atherosclerosis (ICD10-I70.0) and Emphysema (ICD10-J43.9). CTA head: 1. No intracranial large vessel occlusion or proximal high-grade arterial stenosis is identified. 2. Calcified plaque within the intracranial internal carotid arteries with no more than mild stenosis. CT perfusion head: The perfusion software identifies no core infarct. The perfusion software identifies no hypoperfused parenchyma utilizing the Tmax>6 seconds  threshold. However, the perfusion software identifies a 13 mL region of hypoperfusion within the posterior left MCA territory utilizing the Tmax >4.0 seconds threshold. Electronically Signed: By: Kellie Simmering D.O. On: 06/08/2021 12:23       LOS: 0 days   [facility] Pager on [email or web address]  06/09/2021, 12:39 PM

## 2021-06-09 NOTE — Progress Notes (Signed)
   VASCULAR SURGERY ASSESSMENT & PLAN:   FOCAL ABNORMALITY OF THE LATERAL THORACIC AORTA: The patient has a subtle abnormality in the lateral thoracic arch just distal to the takeoff of the left subclavian artery.  This to me does not look like a penetrating ulcer.  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  I have ordered a follow-up CT of the chest in 6 months and we will see her in the office at that time.  LEFT BRAIN STROKE: Her MRI shows small acute cortical infarcts in the left parietal and superior temporal lobe likely in the distal left MCA territory.  There is faint edema without mass-effect.  Her CT angio of the neck shows no significant carotid disease.  On the left side, the common carotid artery, external carotid, and internal carotid arteries are widely patent.  There is a calcific plaque in the distal common carotid artery but this does not produce any focal stenosis.  When she returns in 6 months for her follow-up CT scan of the chest I will also get a follow-up carotid duplex scan.  Vascular surgery will be available as needed.   SUBJECTIVE:   Still with a mild left frontal headache.  PHYSICAL EXAM:   Vitals:   06/08/21 1711 06/08/21 2030 06/09/21 0032 06/09/21 0405  BP: 98/65 (!) 98/51 (!) 102/59 107/62  Pulse: (!) 58 69 (!) 57 63  Resp: 18 16 13 18   Temp: 98.4 F (36.9 C) 97.6 F (36.4 C) 98 F (36.7 C) (!) 97.5 F (36.4 C)  TempSrc: Oral Oral Oral Oral  SpO2: 100% 96% 98% 96%  Weight:      Height:       NEURO: No focal weakness.  LABS:   Lab Results  Component Value Date   WBC 5.7 06/08/2021   HGB 12.6 06/08/2021   HCT 37.0 06/08/2021   MCV 88.6 06/08/2021   PLT 267 06/08/2021   Lab Results  Component Value Date   CREATININE 1.17 (H) 06/09/2021   Lab Results  Component Value Date   INR 1.1 06/08/2021   CBG (last 3)  Recent Labs    06/08/21 0937  GLUCAP 104*    PROBLEM LIST:    Active Problems:   Stroke (cerebrum) (HCC)   Cerebral  infarction (HCC)   CURRENT MEDS:    aspirin  325 mg Oral Daily   enoxaparin (LOVENOX) injection  40 mg Subcutaneous Q24H   levothyroxine  112 mcg Oral q morning   linaclotide  145 mcg Oral Daily   metoprolol succinate  12.5 mg Oral Daily   rosuvastatin  20 mg Oral Daily    Deitra Mayo Office: [phone] 06/09/2021

## 2021-06-09 NOTE — Evaluation (Signed)
Occupational Therapy Evaluation Patient Details Name: Jocelyn [name] MRN: [id number] DOB: February 22, 1961 Today's Date: 06/09/2021   History of Present Illness Jocelyn [name] is a 60 y.o. 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 PMHx: anxiety, arthritis, CHF, HTN, MI, TIA, COPD   Clinical Impression   Jocelyn [name] was indep PTA, she lives alone in a 2 level home, drives and manages her own medication and finances. Upon evaluation pt performed all mobility and ADLs at a mod I/indep level. She has mild expressive communication deficits with word finding trouble but followed all commands accurately. Pt does not require further OT acutely. Recommend d/c home (to her daughter house).   Recommendations for follow up therapy are one component of a multi-disciplinary discharge planning process, led by the attending physician.  Recommendations may be updated based on patient status, additional functional criteria and insurance authorization.   Follow Up Recommendations  No OT follow up    Assistance Recommended at Discharge Intermittent Supervision/Assistance  Functional Status Assessment  Patient has had a recent decline in their functional status and demonstrates the ability to make significant improvements in function in a reasonable and predictable amount of time.  Equipment Recommendations  None recommended by OT    Recommendations for Other Services       Precautions / Restrictions Precautions Precautions: Fall Restrictions Weight Bearing Restrictions: No      Mobility Bed Mobility Overal bed mobility: Independent      Transfers Overall transfer level: Independent            ADL either performed or assessed with clinical judgement   ADL Overall ADL's : Modified independent;At baseline       General ADL Comments: no physical  assist needed, supervision this session for safety only. Pt with good safety awareness.     Vision Baseline Vision/History: 0 No visual deficits Ability to See in Adequate Light: 0 Adequate Vision Assessment?: No apparent visual deficits            Pertinent Vitals/Pain Pain Assessment: No/denies pain Pain Score: 6  Pain Location: headache Pain Descriptors / Indicators: Aching Pain Intervention(s): Limited activity within patient's tolerance;Monitored during session;Repositioned     Hand Dominance Right   Extremity/Trunk Assessment Upper Extremity Assessment Upper Extremity Assessment: Overall WFL for tasks assessed   Lower Extremity Assessment Lower Extremity Assessment: Overall WFL for tasks assessed   Cervical / Trunk Assessment Cervical / Trunk Assessment: Normal   Communication Communication Communication: No difficulties   Cognition Arousal/Alertness: Awake/alert Behavior During Therapy: WFL for tasks assessed/performed Overall Cognitive Status: Within Functional Limits for tasks assessed         General Comments: some work finding imapirments, with increased time was able to say the word     General Comments  VSS on RA, no new concerns     Home Living Family/patient expects to be discharged to:: Private residence Living Arrangements: Alone Available Help at Discharge: Family;Available 24 hours/day Type of Home: Apartment Home Access: Stairs to enter Entrance Stairs-Number of Steps: 3   Home Layout: Two level;Bed/bath upstairs Alternate Level Stairs-Number of Steps: flight with 2 turns (daughters house, flight with no landings) Alternate Level Stairs-Rails: Right Bathroom Shower/Tub: Teacher, early years/pre: Standard     Home Equipment: Cane - single point;Shower seat      Lives With: Alone (plans to d/c to daughters  home with very similar set up)    Prior Functioning/Environment Prior Level of Function : Independent/Modified  Independent             Mobility Comments: no AD ADLs Comments: drives        OT Problem List: Decreased cognition         OT Goals(Current goals can be found in the care plan section) Acute Rehab OT Goals Patient Stated Goal: home today OT Goal Formulation: All assessment and education complete, DC therapy   AM-PAC OT "6 Clicks" Daily Activity     Outcome Measure Help from another person eating meals?: None Help from another person taking care of personal grooming?: None Help from another person toileting, which includes using toliet, bedpan, or urinal?: None Help from another person bathing (including washing, rinsing, drying)?: None Help from another person to put on and taking off regular upper body clothing?: None Help from another person to put on and taking off regular lower body clothing?: None 6 Click Score: 24   End of Session Nurse Communication: Mobility status  Activity Tolerance: Patient tolerated treatment well Patient left: in chair;with call bell/phone within reach  OT Visit Diagnosis: Cognitive communication deficit (R41.841) Symptoms and signs involving cognitive functions: Cerebral infarction                Time: 5208-0223 OT Time Calculation (min): 21 min Charges:  OT General Charges $OT Visit: 1 Visit OT Evaluation $OT Eval Low Complexity: 1 Low   Jocelyn [name] 06/09/2021, 1:06 PM

## 2021-06-10 ENCOUNTER — Encounter (HOSPITAL_COMMUNITY): Payer: Self-pay | Admitting: Internal Medicine

## 2021-06-10 ENCOUNTER — Inpatient Hospital Stay (HOSPITAL_COMMUNITY): Payer: Medicare Other

## 2021-06-10 ENCOUNTER — Inpatient Hospital Stay (HOSPITAL_COMMUNITY): Payer: Medicare Other | Admitting: Anesthesiology

## 2021-06-10 ENCOUNTER — Encounter (HOSPITAL_COMMUNITY)
Admission: EM | Disposition: A | Discharge status: Home / Self Care | Payer: Self-pay | Source: Home / Self Care | Attending: Internal Medicine

## 2021-06-10 DIAGNOSIS — I6300 Cerebral infarction due to thrombosis of unspecified precerebral artery: Secondary | ICD-10-CM | POA: Diagnosis not present

## 2021-06-10 DIAGNOSIS — I10 Essential (primary) hypertension: Secondary | ICD-10-CM

## 2021-06-10 DIAGNOSIS — I639 Cerebral infarction, unspecified: Secondary | ICD-10-CM | POA: Diagnosis not present

## 2021-06-10 HISTORY — PX: BUBBLE STUDY: SHX6837

## 2021-06-10 HISTORY — PX: LOOP RECORDER INSERTION: EP1214

## 2021-06-10 HISTORY — PX: TEE WITHOUT CARDIOVERSION: SHX5443

## 2021-06-10 SURGERY — ECHOCARDIOGRAM, TRANSESOPHAGEAL
Anesthesia: Monitor Anesthesia Care

## 2021-06-10 SURGERY — LOOP RECORDER INSERTION

## 2021-06-10 MED ORDER — ROSUVASTATIN CALCIUM 20 MG PO TABS: 20.0000 mg | ORAL_TABLET | Freq: Every day | ORAL | 3 refills | Status: DC

## 2021-06-10 MED ORDER — LIDOCAINE-EPINEPHRINE 1 %-1:100000 IJ SOLN
INTRAMUSCULAR | Status: AC
  Filled 2021-06-10: qty 1

## 2021-06-10 MED ORDER — PROPOFOL 10 MG/ML IV BOLUS
INTRAVENOUS | Status: DC | PRN
  Administered 2021-06-10 (×2): 50 mg via INTRAVENOUS

## 2021-06-10 MED ORDER — PROPOFOL 500 MG/50ML IV EMUL
INTRAVENOUS | Status: DC | PRN
  Administered 2021-06-10: 150 ug/kg/min via INTRAVENOUS

## 2021-06-10 MED ORDER — LIDOCAINE-EPINEPHRINE 1 %-1:100000 IJ SOLN
INTRAMUSCULAR | Status: DC | PRN
  Administered 2021-06-10: 30 mL

## 2021-06-10 MED ORDER — ASPIRIN EC 81 MG PO TBEC: 81.0000 mg | DELAYED_RELEASE_TABLET | Freq: Every day | ORAL | 0 refills | Status: AC

## 2021-06-10 MED ORDER — LIDOCAINE 2% (20 MG/ML) 5 ML SYRINGE
INTRAMUSCULAR | Status: DC | PRN
  Administered 2021-06-10: 100 mg via INTRAVENOUS

## 2021-06-10 MED ORDER — CLOPIDOGREL BISULFATE 75 MG PO TABS: 75.0000 mg | ORAL_TABLET | Freq: Every day | ORAL | 11 refills | Status: DC

## 2021-06-10 MED ORDER — SODIUM CHLORIDE 0.9 % IV SOLN
INTRAVENOUS | Status: AC | PRN
  Administered 2021-06-10: 500 mL via INTRAMUSCULAR

## 2021-06-10 SURGICAL SUPPLY — 2 items
PACK LOOP INSERTION (CUSTOM PROCEDURE TRAY) ×2 IMPLANT
SYSTEM MONITOR REVEAL LINQ II (Prosthesis & Implant Heart) ×2 IMPLANT

## 2021-06-10 NOTE — Anesthesia Preprocedure Evaluation (Signed)
Anesthesia Evaluation  Patient identified by MRN, date of birth, ID band Patient awake    Reviewed: Allergy & Precautions, NPO status , Patient's Chart, lab work & pertinent test results  Airway Mallampati: II  TM Distance: >3 FB Neck ROM: Full    Dental  (+) Dental Advisory Given   Pulmonary COPD, Current Smoker,    breath sounds clear to auscultation       Cardiovascular hypertension, Pt. on medications and Pt. on home beta blockers + angina + CAD, + Past MI and +CHF   Rhythm:Regular Rate:Normal     Neuro/Psych TIACVA    GI/Hepatic negative GI ROS, Neg liver ROS,   Endo/Other  Hypothyroidism   Renal/GU negative Renal ROS     Musculoskeletal  (+) Arthritis ,   Abdominal   Peds  Hematology negative hematology ROS (+)   Anesthesia Other Findings   Reproductive/Obstetrics                             Anesthesia Physical Anesthesia Plan  ASA: 3  Anesthesia Plan: MAC   Post-op Pain Management:    Induction:   PONV Risk Score and Plan: 1 and Propofol infusion and Treatment may vary due to age or medical condition  Airway Management Planned: Natural Airway and Nasal Cannula  Additional Equipment:   Intra-op Plan:   Post-operative Plan:   Informed Consent: I have reviewed the patients History and Physical, chart, labs and discussed the procedure including the risks, benefits and alternatives for the proposed anesthesia with the patient or authorized representative who has indicated his/her understanding and acceptance.       Plan Discussed with: CRNA  Anesthesia Plan Comments:         Anesthesia Quick Evaluation

## 2021-06-10 NOTE — Discharge Summary (Signed)
[facility]  Physician Discharge Summary   Patient ID: SEYNABOU FULTS MRN: [id number] DOB/AGE: [birth year] 60 y.o.  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  Speech was slurred and she found it difficult to speak movements.  MRI showed left brain infarct.  Patient was hospitalized for further management.   Reason for Visit: Acute stroke   Consultants:  Neurology Vascular surgery   Procedures:  Transthoracic echocardiogram (see report below)  TEE  1. Lateral akinesis with overall mild LV dysfunction; negative saline  microcavitation study.   2. Left ventricular ejection fraction, by estimation, is 40 to 45%. The  left ventricle has mildly decreased function. The left ventricle  demonstrates regional wall motion abnormalities (see scoring  diagram/findings for description).   3. Right ventricular systolic function is normal. The right ventricular  size is normal.   4. No left atrial/left atrial appendage thrombus was detected.   5. The mitral valve  is normal in structure. Trivial mitral valve  regurgitation.   6. The aortic valve is tricuspid. Aortic valve regurgitation is not  visualized.   7. There is mild (Grade II) plaque involving the descending aorta.   8. Agitated saline contrast bubble study was negative, with no evidence  of any interatrial shunt.   Implantation of loop recorder    HOSPITAL COURSE:   Acute stroke/cryptogenic Involvement of the left hemisphere.  Presented with speech difficulties.  Did not have any motor deficits.  Seen by neurology.  Underwent echocardiogram.  EF was noted to be 40 to 45%. LDL is 112.  HbA1c 6.2.   To be discharged on aspirin and Plavix for 3 weeks followed by Plavix alone. Statin resumed. Concern was for cardiac shunt.  Lower extremity Dopplers without DVT.  Underwent TEE which did not show any shunting.  No thrombus was noted. Loop recorder subsequently placed.   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  She will need a repeat CT chest in 6 months, ordered by vascular surgery.  Outpatient follow-up will be arranged by vascular surgery.   History of LV thrombus This was in 2021.  She was treated with warfarin for 3 months. Current echocardiogram does not show any LV thrombus.   Essential hypertension Continue home medications  History of coronary artery disease Stable.  Denies any chest pain.  Chronic systolic CHF Seems to be well compensated.  Continue beta-blocker.  Not on ACE inhibitor or ARB due to elevated creatinine.  Outpatient follow-up with cardiology.  History of COPD Stable.  No wheezing appreciated.  Hypothyroidism Stable.  Continue  levothyroxine.  Anxiety and depression Stable.  Continue home medications   Obesity Estimated body mass index is 30.88 kg/m as calculated from the following:   Height as  of this encounter: 5\' 4"  (1.626 m).   Weight as of this encounter: 81.6 kg.     Patient is stable.  Okay for discharge home today.   PERTINENT LABS:  The results of significant diagnostics from this hospitalization (including imaging, microbiology, ancillary and laboratory) are listed below for reference.    Microbiology: Recent Results (from the past 240 hour(s))  Resp Panel by RT-PCR (Flu A&B, Covid) Nasopharyngeal Swab     Status: None   Collection Time: 06/08/21  1:50 PM   Specimen: Nasopharyngeal Swab; Nasopharyngeal(NP) swabs in vial transport medium  Result Value Ref Range Status   SARS Coronavirus 2 by RT PCR NEGATIVE NEGATIVE Final    Comment: (NOTE) SARS-CoV-2 target nucleic acids are NOT DETECTED.  The SARS-CoV-2 RNA is generally detectable in upper respiratory specimens during the acute phase of infection. The lowest concentration of SARS-CoV-2 viral copies this assay can detect is 138 copies/mL. A negative result does not preclude SARS-Cov-2 infection and should not be used as the sole basis for treatment or other patient management decisions. A negative result may occur with  improper specimen collection/handling, submission of specimen other than nasopharyngeal swab, presence of viral mutation(s) within the areas targeted by this assay, and inadequate number of viral copies(<138 copies/mL). A negative result must be combined with clinical observations, patient history, and epidemiological information. The expected result is Negative.  Fact Sheet for Patients:  EntrepreneurPulse.com.au  Fact Sheet for Healthcare Providers:  IncredibleEmployment.be  This test is no t yet approved or cleared by the [place] FDA and  has been authorized for detection and/or diagnosis of SARS-CoV-2 by FDA under an Emergency Use Authorization (EUA). This EUA will remain  in effect (meaning this test can be used) for the duration of  the COVID-19 declaration under Section 564(b)(1) of the Act, 21 U.S.C.section 360bbb-3(b)(1), unless the authorization is terminated  or revoked sooner.       Influenza A by PCR NEGATIVE NEGATIVE Final   Influenza B by PCR NEGATIVE NEGATIVE Final    Comment: (NOTE) The Xpert Xpress SARS-CoV-2/FLU/RSV plus assay is intended as an aid in the diagnosis of influenza from Nasopharyngeal swab specimens and should not be used as a sole basis for treatment. Nasal washings and aspirates are unacceptable for Xpert Xpress SARS-CoV-2/FLU/RSV testing.  Fact Sheet for Patients: EntrepreneurPulse.com.au  Fact Sheet for Healthcare Providers: IncredibleEmployment.be  This test is not yet approved or cleared by the [place] FDA and has been authorized for detection and/or diagnosis of SARS-CoV-2 by FDA under an Emergency Use Authorization (EUA). This EUA will remain in effect (meaning this test can be used) for the duration of the COVID-19 declaration under Section 564(b)(1) of the Act, 21 U.S.C. section 360bbb-3(b)(1), unless the authorization is terminated or revoked.  Performed at [facility], [place] [address], [place], [place] [address]      Labs:  COVID-19 Labs   Lab Results  Component Value Date   SARSCOV2NAA NEGATIVE 06/08/2021   [place] NEGATIVE 05/15/2021   [place] NEGATIVE 05/16/2020      Basic Metabolic Panel: Recent Labs  Lab 06/08/21 0925 06/08/21 0937 06/09/21 0355  NA 136 139 137  K 4.2 4.1 4.2  CL 100 101 102  CO2 25  --  25  GLUCOSE 97 98 101*  BUN 18 18  19  CREATININE 1.30* 1.40* 1.17*  CALCIUM 9.3  --  9.2   Liver Function Tests: Recent Labs  Lab 06/08/21 0925  AST 20  ALT 30  ALKPHOS 61  BILITOT 0.9  PROT 6.4*  ALBUMIN 3.0*    CBC: Recent Labs  Lab 06/08/21 0925 06/08/21 0937  WBC 5.7  --   NEUTROABS 2.5  --   HGB 12.8 12.6  HCT 38.0 37.0  MCV 88.6  --   PLT 267  --       CBG: Recent Labs  Lab 06/08/21 0937  GLUCAP 104*     IMAGING STUDIES CT HEAD WO CONTRAST  Result Date: 06/08/2021 CLINICAL DATA:  Neuro deficit, acute, stroke suspected EXAM: CT HEAD WITHOUT CONTRAST TECHNIQUE: Contiguous axial images were obtained from the base of the skull through the vertex without intravenous contrast. COMPARISON:  08/20/2016. FINDINGS: Brain: No evidence of acute infarction, hemorrhage, hydrocephalus, extra-axial collection or mass lesion/mass effect. Vascular: No hyperdense vessel identified. Calcific intracranial atherosclerosis. Skull: No evidence of acute fracture. Sinuses/Orbits: Mild paranasal sinus mucosal thickening. Unremarkable orbits. Other: No mastoid effusions. IMPRESSION: No evidence of acute intracranial abnormality. Electronically Signed   By: Margaretha Sheffield M.D.   On: 06/08/2021 10:11   CT Angio Chest Pulmonary Embolism (PE) W or WO Contrast  Result Date: 05/16/2021 CLINICAL DATA:  Shortness of breath and positive D-dimer EXAM: CT ANGIOGRAPHY CHEST WITH CONTRAST TECHNIQUE: Multidetector CT imaging of the chest was performed using the standard protocol during bolus administration of intravenous contrast. Multiplanar CT image reconstructions and MIPs were obtained to evaluate the vascular anatomy. CONTRAST:  36mL OMNIPAQUE IOHEXOL 350 MG/ML SOLN COMPARISON:  Chest x-ray from the previous day. FINDINGS: Cardiovascular: Thoracic aorta and its branches demonstrate atherosclerotic calcification without aneurysmal dilatation or dissection. Pulmonary artery shows a normal branching pattern. No filling defect to suggest pulmonary embolism is noted. Mild coronary calcifications are seen. No cardiac enlargement is noted. Mediastinum/Nodes: Thoracic inlet is within normal limits. The esophagus as visualized is within normal limits. No sizable hilar or mediastinal adenopathy is noted. Lungs/Pleura: Lungs are well aerated bilaterally. Mild emphysematous changes are  seen. Patchy ground-glass airspace opacities are noted bilaterally predominately within the upper lobes consistent with postinflammatory change. No sizable effusion is noted. No sizable parenchymal nodules are seen. Upper Abdomen: Visualized upper abdomen shows no acute abnormality. Musculoskeletal: Degenerative changes of the thoracic spine are noted. No acute rib abnormality is noted. Review of the MIP images confirms the above findings. IMPRESSION: No evidence of pulmonary emboli. Patchy ground-glass opacities consistent with a postinflammatory nature predominately within the upper lobes. No other focal abnormality is seen. Electronically Signed   By: Inez Catalina M.D.   On: 05/16/2021 23:23   MR BRAIN WO CONTRAST  Result Date: 06/08/2021 CLINICAL DATA:  Neuro deficit, acute, stroke suspected EXAM: MRI HEAD WITHOUT CONTRAST TECHNIQUE: Multiplanar, multiecho pulse sequences of the brain and surrounding structures were obtained without intravenous contrast. COMPARISON:  Same day CT/CTA. FINDINGS: Brain: Small acute cortical infarcts in the left parietal and superior temporal lobe, likely distal left MCA territory. Faint edema without mass effect. No midline shift. Basal cisterns are patent. Otherwise, mild scattered T2 hyperintensities in the white matter, nonspecific but compatible with chronic microvascular ischemic disease. No hydrocephalus, mass lesion, extra-axial fluid collection, or acute hemorrhage. Vascular: Better evaluated on concurrent CTA. Skull and upper cervical spine: Heterogeneous marrow without focal suspicious lesion. Sinuses/Orbits: Minimal paranasal sinus mucosal thickening. Unremarkable orbits. Other: No sizable mastoid effusions. IMPRESSION: 1. Small  acute cortical infarcts in the left parietal and superior temporal lobe, likely distal left MCA territory. Faint edema without mass effect. 2. Mild chronic microvascular disease. Electronically Signed   By: Margaretha Sheffield M.D.   On:  06/08/2021 12:20   EP PPM/ICD IMPLANT  Result Date: 06/10/2021 SURGEON:  Thompson Grayer, MD   PREPROCEDURE DIAGNOSIS:  Cryptogenic Stroke   POSTPROCEDURE DIAGNOSIS:  Cryptogenic Stroke    PROCEDURES:  1. Implantable loop recorder implantation   INTRODUCTION:  Indyah FUMIE FIALLO is a 60 y.o. female with a history of unexplained stroke who presents today for implantable loop implantation.  The patient has had a cryptogenic stroke.  Despite an extensive workup by neurology, no reversible causes have been identified.  she has worn telemetry during which she did not have arrhythmias.  There is significant concern for possible atrial fibrillation as the cause for the patients stroke.  The patient therefore presents today for implantable loop implantation.   DESCRIPTION OF PROCEDURE:  Informed written consent was obtained.  The patient required no sedation for the procedure today.  The patients left chest was prepped and draped. Mapping over the patient's chest was performed to identify the appropriate ILR site.  This area was found to be the left parasternal region over the 3rd-4th intercostal space.  The skin overlying this region was infiltrated with lidocaine for local analgesia.  A 0.5-cm incision was made at the implant site.  A subcutaneous ILR pocket was fashioned using a combination of sharp and blunt dissection.  A Medtronic Reveal Linq model [id number] implantable loop recorder was then placed into the pocket R waves were very prominent and measured > 0.2 mV. EBL<1 ml.  Steri- Strips and a sterile dressing were then applied.  There were no early apparent complications.   CONCLUSIONS:  1. Successful implantation of a Medtronic Reveal LINQ implantable loop recorder for cryptogenic stroke  2. No early apparent complications. Thompson Grayer MD, [facility] 06/10/2021 5:23 PM  DG CHEST PORT 1 VIEW  Result Date: 05/18/2021 CLINICAL DATA:  Respiratory distress EXAM: PORTABLE CHEST 1 VIEW COMPARISON:  05/16/2021 CT FINDINGS:  Cardiac shadow is within normal limits. The lungs are well aerated bilaterally. The changes seen on recent CT are not well appreciated on plain film examination. No sizable effusion is seen. No bony abnormality is noted. IMPRESSION: No acute abnormality noted. The findings on recent chest CT are not well appreciated on this study. Electronically Signed   By: Inez Catalina M.D.   On: 05/18/2021 00:17   DG Chest Port 1 View  Result Date: 05/15/2021 CLINICAL DATA:  Cough and shortness of breath. EXAM: PORTABLE CHEST 1 VIEW COMPARISON:  May 16, 2020 FINDINGS: The heart size and mediastinal contours are within normal limits. No focal consolidation. No visible pleural effusion or pneumothorax. The visualized skeletal structures are unremarkable. IMPRESSION: No acute cardiopulmonary disease. Electronically Signed   By: Dahlia Bailiff M.D.   On: 05/15/2021 21:13   VAS [place] TRANSCRANIAL DOPPLER W BUBBLES  Result Date: 06/10/2021  Transcranial Doppler with Bubble Patient Name:  JOHNNY LATU Tatsch  Date of Exam:   06/09/2021 Medical Rec #: [id number]       Accession #:    [id number] Date of Birth: June 09, 1961      Patient Gender: F Patient Age:   38 years Exam [facility]  [facility] Procedure:      VAS [place] TRANSCRANIAL DOPPLER W BUBBLES Referring Phys: Lelan Pons FRANCOIS --------------------------------------------------------------------------------  Indications: Stroke. Comparison Study: no prior Performing Technologist:  Megan Stricklin RVS  Examination Guidelines: A complete evaluation includes B-mode imaging, spectral Doppler, color Doppler, and power Doppler as needed of all accessible portions of each vessel. Bilateral testing is considered an integral part of a complete examination. Limited examinations for reoccurring indications may be performed as noted.  Summary:  A vascular evaluation was performed. The right middle cerebral artery was studied. An IV was inserted into the patient's right AC . Verbal informed  consent was obtained.  HITS heard at rest and during valsalva: Spencer grade 3 Positive TCD Bubble study indicative of a small right to left shunt *See table(s) above for TCD measurements and observations.  Diagnosing physician: Antony Contras MD Electronically signed by Antony Contras MD on 06/10/2021 at 2:42:50 PM.    Final    ECHOCARDIOGRAM COMPLETE  Result Date: 05/19/2021    ECHOCARDIOGRAM REPORT   Patient Name:   JAMMIE TROUP Kucinski Date of Exam: 05/19/2021 Medical Rec #:  [id number]      Height:       64.0 in Accession #:    [id number]     Weight:       189.8 lb Date of Birth:  11-29-1960     BSA:          1.913 m Patient Age:    75 years       BP:           123/71 mmHg Patient Gender: F              HR:           82 bpm. Exam Location:  Inpatient Procedure: 2D Echo, Cardiac Doppler, Color Doppler and Intracardiac            Opacification Agent Indications:    R06.9 DOE  History:        Patient has prior history of Echocardiogram examinations, most                 recent 08/27/2020. CHF, CAD, COPD; Risk Factors:Hypertension and                 Dyslipidemia.  Sonographer:    Raquel Sarna Senior RDCS Referring Phys: [id number] Collier Bullock  Sonographer Comments: Technically difficult due to very poor echo windows, even with definity. Active severe COPD flare-up. IMPRESSIONS  1. Left ventricular ejection fraction, by estimation, is 40 to 45%. The left ventricle has mildly decreased function. The left ventricle demonstrates global hypokinesis. Left ventricular diastolic parameters are consistent with Grade I diastolic dysfunction (impaired relaxation).  2. Right ventricular systolic function was not well visualized. The right ventricular size is normal. Tricuspid regurgitation signal is inadequate for assessing PA pressure.  3. The mitral valve is normal in structure. No evidence of mitral valve regurgitation. No evidence of mitral stenosis.  4. The aortic valve was not well visualized. Aortic valve regurgitation is not  visualized. No aortic stenosis is present.  5. The inferior vena cava is normal in size with greater than 50% respiratory variability, suggesting right atrial pressure of 3 mmHg. FINDINGS  Left Ventricle: Left ventricular ejection fraction, by estimation, is 40 to 45%. The left ventricle has mildly decreased function. The left ventricle demonstrates global hypokinesis. Definity contrast agent was given IV to delineate the left ventricular  endocardial borders. The left ventricular internal cavity size was normal in size. There is no left ventricular hypertrophy. Left ventricular diastolic parameters are consistent with Grade I diastolic dysfunction (impaired relaxation). Right Ventricle: The right ventricular size is normal.  No increase in right ventricular wall thickness. Right ventricular systolic function was not well visualized. Tricuspid regurgitation signal is inadequate for assessing PA pressure. Left Atrium: Left atrial size was not well visualized. Right Atrium: Right atrial size was not well visualized. Pericardium: There is no evidence of pericardial effusion. Presence of pericardial fat pad. Mitral Valve: The mitral valve is normal in structure. No evidence of mitral valve regurgitation. No evidence of mitral valve stenosis. Tricuspid Valve: The tricuspid valve is not well visualized. Tricuspid valve regurgitation is not demonstrated. No evidence of tricuspid stenosis. Aortic Valve: The aortic valve was not well visualized. Aortic valve regurgitation is not visualized. No aortic stenosis is present. Pulmonic Valve: The pulmonic valve was not well visualized. Pulmonic valve regurgitation is not visualized. No evidence of pulmonic stenosis. Aorta: The aortic root is normal in size and structure. Venous: The inferior vena cava is normal in size with greater than 50% respiratory variability, suggesting right atrial pressure of 3 mmHg. IAS/Shunts: No atrial level shunt detected by color flow Doppler.  LEFT  VENTRICLE PLAX 2D LVIDd:         4.40 cm Diastology LVIDs:         3.40 cm LV e' medial:    4.46 cm/s LV PW:         0.80 cm LV E/e' medial:  12.4 LV IVS:        0.70 cm LV e' lateral:   5.11 cm/s                        LV E/e' lateral: 10.8  LEFT ATRIUM             Index LA diam:        3.50 cm 1.83 cm/m LA Vol (A2C):   42.4 ml 22.16 ml/m LA Vol (A4C):   16.6 ml 8.68 ml/m LA Biplane Vol: 26.7 ml 13.95 ml/m  AORTIC VALVE LVOT Vmax:   74.40 cm/s LVOT Vmean:  56.300 cm/s LVOT VTI:    0.134 m MITRAL VALVE MV Area (PHT): 3.00 cm    SHUNTS MV Decel Time: 253 msec    Systemic VTI: 0.13 m MV E velocity: 55.30 cm/s MV A velocity: 73.70 cm/s MV E/A ratio:  0.75 Kardie Tobb DO Electronically signed by Berniece Salines DO Signature Date/Time: 05/19/2021/2:02:05 PM    Final    ECHO TEE  Result Date: 06/10/2021    TRANSESOPHOGEAL ECHO REPORT   Patient Name:   TASHEENA WAMBOLT Popoff Date of Exam: 06/10/2021 Medical Rec #:  [id number]      Height:       64.0 in Accession #:    [id number]     Weight:       179.9 lb Date of Birth:  [birth year]     BSA:          1.870 m Patient Age:    5 years       BP:           100/64 mmHg Patient Gender: F              HR:           88 bpm. Exam Location:  Inpatient Procedure: Transesophageal Echo, Color Doppler, Cardiac Doppler and Saline            Contrast Bubble Study Indications:     Stroke  History:         Patient has prior history of Echocardiogram examinations, most  recent 06/08/2021. CHF, Previous Myocardial Infarction, TIA and                  COPD; Risk Factors:Hypertension.  Sonographer:     Bernadene Person RDCS Referring Phys:  [id number] Tami Lin [facility] Diagnosing Phys: Kirk Ruths MD PROCEDURE: After discussion of the risks and benefits of a TEE, an informed consent was obtained from the patient. The transesophogeal probe was passed without difficulty through the esophogus of the patient. Sedation performed by different physician. The patient was monitored while  under deep sedation. Anesthestetic sedation was provided intravenously by Anesthesiology: 246.88mg  of Propofol, 100mg  of Lidocaine. The patient's vital signs; including heart rate, blood pressure, and oxygen saturation; remained stable throughout the procedure. The patient developed no complications during the procedure. IMPRESSIONS  1. Lateral akinesis with overall mild LV dysfunction; negative saline microcavitation study.  2. Left ventricular ejection fraction, by estimation, is 40 to 45%. The left ventricle has mildly decreased function. The left ventricle demonstrates regional wall motion abnormalities (see scoring diagram/findings for description).  3. Right ventricular systolic function is normal. The right ventricular size is normal.  4. No left atrial/left atrial appendage thrombus was detected.  5. The mitral valve is normal in structure. Trivial mitral valve regurgitation.  6. The aortic valve is tricuspid. Aortic valve regurgitation is not visualized.  7. There is mild (Grade II) plaque involving the descending aorta.  8. Agitated saline contrast bubble study was negative, with no evidence of any interatrial shunt. FINDINGS  Left Ventricle: Left ventricular ejection fraction, by estimation, is 40 to 45%. The left ventricle has mildly decreased function. The left ventricle demonstrates regional wall motion abnormalities. The left ventricular internal cavity size was normal in size. Right Ventricle: The right ventricular size is normal. Right ventricular systolic function is normal. Left Atrium: Left atrial size was normal in size. No left atrial/left atrial appendage thrombus was detected. Right Atrium: Right atrial size was normal in size. Pericardium: There is no evidence of pericardial effusion. Mitral Valve: The mitral valve is normal in structure. Trivial mitral valve regurgitation. Tricuspid Valve: The tricuspid valve is normal in structure. Tricuspid valve regurgitation is trivial. Aortic Valve:  The aortic valve is tricuspid. Aortic valve regurgitation is not visualized. Pulmonic Valve: The pulmonic valve was normal in structure. Pulmonic valve regurgitation is trivial. Aorta: The aortic root is normal in size and structure. There is mild (Grade II) plaque involving the descending aorta. IAS/Shunts: The interatrial septum appears to be lipomatous. No atrial level shunt detected by color flow Doppler. Agitated saline contrast was given intravenously to evaluate for intracardiac shunting. Agitated saline contrast bubble study was negative,  with no evidence of any interatrial shunt. Additional Comments: Lateral akinesis with overall mild LV dysfunction; negative saline microcavitation study. Kirk Ruths MD Electronically signed by Kirk Ruths MD Signature Date/Time: 06/10/2021/2:12:16 PM    Final    VAS [place] LOWER EXTREMITY VENOUS (DVT)  Result Date: 06/10/2021  Lower Venous DVT Study Patient Name:  BROOKELYNN HAMOR  Date of Exam:   06/09/2021 Medical Rec #: [id number]       Accession #:    [id number] Date of Birth: 02-Oct-1960      Patient Gender: F Patient Age:   27 years Exam Location:  [facility] Procedure:      VAS [place] LOWER EXTREMITY VENOUS (DVT) Referring Phys: PRAMOD SETHI --------------------------------------------------------------------------------  Indications: Stroke.  Comparison Study: no prior Performing Technologist: Archie Patten RVS  Examination Guidelines: A complete evaluation includes  B-mode imaging, spectral Doppler, color Doppler, and power Doppler as needed of all accessible portions of each vessel. Bilateral testing is considered an integral part of a complete examination. Limited examinations for reoccurring indications may be performed as noted. The reflux portion of the exam is performed with the patient in reverse Trendelenburg.  +---------+---------------+---------+-----------+----------+--------------+ RIGHT     CompressibilityPhasicitySpontaneityPropertiesThrombus Aging +---------+---------------+---------+-----------+----------+--------------+ CFV      Full           Yes      Yes                                 +---------+---------------+---------+-----------+----------+--------------+ SFJ      Full                                                        +---------+---------------+---------+-----------+----------+--------------+ FV Prox  Full                                                        +---------+---------------+---------+-----------+----------+--------------+ FV Mid   Full                                                        +---------+---------------+---------+-----------+----------+--------------+ FV DistalFull                                                        +---------+---------------+---------+-----------+----------+--------------+ PFV      Full                                                        +---------+---------------+---------+-----------+----------+--------------+ POP      Full           Yes      Yes                                 +---------+---------------+---------+-----------+----------+--------------+ PTV      Full                                                        +---------+---------------+---------+-----------+----------+--------------+ PERO     Full                                                        +---------+---------------+---------+-----------+----------+--------------+   +---------+---------------+---------+-----------+----------+--------------+  LEFT     CompressibilityPhasicitySpontaneityPropertiesThrombus Aging +---------+---------------+---------+-----------+----------+--------------+ CFV      Full           Yes      Yes                                 +---------+---------------+---------+-----------+----------+--------------+ SFJ      Full                                                         +---------+---------------+---------+-----------+----------+--------------+ FV Prox  Full                                                        +---------+---------------+---------+-----------+----------+--------------+ FV Mid   Full                                                        +---------+---------------+---------+-----------+----------+--------------+ FV DistalFull                                                        +---------+---------------+---------+-----------+----------+--------------+ PFV      Full                                                        +---------+---------------+---------+-----------+----------+--------------+ POP      Full           Yes      Yes                                 +---------+---------------+---------+-----------+----------+--------------+ PTV      Full                                                        +---------+---------------+---------+-----------+----------+--------------+ PERO     Full                                                        +---------+---------------+---------+-----------+----------+--------------+     Summary: BILATERAL: - No evidence of deep vein thrombosis seen in the lower extremities, bilaterally. -No evidence of popliteal cyst, bilaterally.   *See table(s) above for measurements and observations. Electronically signed by Monica Martinez MD on 06/10/2021 at 3:28:47 PM.  Final    ECHOCARDIOGRAM LIMITED  Result Date: 06/08/2021    ECHOCARDIOGRAM REPORT   Patient Name:   AITANA BURRY Maulden Date of Exam: 06/08/2021 Medical Rec #:  [id number]      Height:       64.0 in Accession #:    [id number]     Weight:       179.9 lb Date of Birth:  [birth year]     BSA:          1.870 m Patient Age:    89 years       BP:           104/61 mmHg Patient Gender: F              HR:           62 bpm. Exam Location:  Inpatient Procedure: Limited Echo and Intracardiac Opacification Agent  Indications:    Stroke  History:        Patient has prior history of Echocardiogram examinations, most                 recent 05/19/2021. CAD, COPD; Risk Factors:Hypertension and                 Dyslipidemia. H/O LV thrombus.  Sonographer:    Merrie Roof RDCS Referring Phys: [id number] [place]  1. There is no left ventricular thrombus with Definity contrast. The left ventricle has mildly decreased function. There is moderate hypokinesis of the left ventricular, basal-mid inferior wall, inferoseptal wall and inferolateral wall. 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 Comparison(s): A prior study was performed on 05/19/2021 and 05/16/2020. Prior images reviewed side by side. The left ventricular function is unchanged. The left ventricular wall motion abnormality is unchanged. The current study was limited to LV wall motion and systolic function assessment. The LV thrombus described in 2021 is no longer present. FINDINGS  Left Ventricle: There is no left ventricular thrombus with Definity contrast. The left ventricle has mildly decreased function. Moderate hypokinesis of the left ventricular, basal-mid inferior wall, inferoseptal wall and inferolateral wall. Definity contrast agent was given IV to delineate the left ventricular endocardial borders.  LV Wall Scoring: The posterior wall is hypokinetic. The entire anterior wall, antero-lateral wall, entire septum, entire apex, and entire inferior wall are normal. Findings suggest old infarction in the left circumflex coronary distribution. Sanda Klein MD Electronically signed by Sanda Klein MD Signature Date/Time: 06/08/2021/5:28:51 PM    Final    CT ANGIO HEAD NECK W WO CM W PERF (CODE STROKE)  Addendum Date: 06/08/2021   ADDENDUM REPORT: 06/08/2021 12:59 ADDENDUM: CTA neck impression #3 and CTA head impression #1 called by telephone at the time of interpretation on 06/08/2021 at 12:23 pm to provider Dr.  Roslynn Amble, who verbally acknowledged these results. Electronically Signed   By: Kellie Simmering D.O.   On: 06/08/2021 12:59   Result Date: 06/08/2021 CLINICAL DATA:  Neuro deficit, acute, stroke suspected. Additional history provided: Aphasia. EXAM: CT ANGIOGRAPHY HEAD AND NECK CT PERFUSION BRAIN TECHNIQUE: Multidetector CT imaging of the head and neck was performed using the standard protocol during bolus administration of intravenous contrast. Multiplanar CT image reconstructions and MIPs were obtained to evaluate the vascular anatomy. Carotid stenosis measurements (when applicable) are obtained utilizing NASCET criteria, using the distal internal carotid diameter as the denominator. Multiphase CT imaging of the brain was performed following IV bolus contrast injection. Subsequent parametric perfusion maps were calculated  using RAPID software. CONTRAST:  117mL OMNIPAQUE IOHEXOL 350 MG/ML SOLN COMPARISON:  Noncontrast head CT performed earlier today 06/08/2021. FINDINGS: CTA NECK FINDINGS Aortic arch: Standard aortic branching. Soft and calcified plaque within the visualized aortic arch and proximal major branch vessels of the neck. Probable non-acute penetrating atherosclerotic ulceration and/or chronic focal dissection of the lateral arch (for instance as seen on series 7, images 304 and 305) (series 10, image 168). The arch is nonaneurysmal at this level, measuring 2.5 cm in diameter. No hemodynamically significant innominate or proximal subclavian artery stenosis. Right carotid system: CCA and ICA patent within the neck without significant stenosis (50% or greater). Mild atherosclerotic plaque within the distal CCA, carotid bifurcation and proximal ICA. Left carotid system: CCA and ICA patent within the neck without significant stenosis (50% or greater). Mild to moderate predominantly calcified plaque within the distal CCA, carotid bifurcation and proximal ICA. Vertebral arteries: Streak and beam hardening  artifact arising from a dense right-sided contrast bolus partially obscures the V1 right vertebral artery. Within this limitation, the vertebral arteries are patent within the neck without appreciable stenosis. Minimal nonstenotic calcified plaque within the distal cervical left vertebral artery. Skeleton: Cervical spondylosis. No acute bony abnormality or aggressive osseous lesion partially imaged thoracic levocurvature. Other neck: 11 mm right thyroid lobe nodule. The left thyroid lobe is poorly delineated, markedly atrophic or surgically absent. Upper chest: The centrilobular and paraseptal emphysema. No consolidation within the imaged lung apices. Review of the MIP images confirms the above findings CTA HEAD FINDINGS Anterior circulation: The intracranial internal carotid arteries are patent. Mild calcified plaque within both vessels without stenosis. The M1 middle cerebral arteries are patent. No M2 proximal branch occlusion or high-grade proximal stenosis is identified. The anterior cerebral arteries are patent. No intracranial aneurysm is identified. Posterior circulation: The intracranial vertebral arteries are patent. The basilar artery is patent. The posterior cerebral arteries are patent. The posterior cerebral arteries are fetal in origin bilaterally. Venous sinuses: Within the limitations of contrast timing, no convincing thrombus. Anatomic variants: As described. Review of the MIP images confirms the above findings CT Brain Perfusion Findings: CBF (<30%) Volume: 4mL Perfusion (Tmax>6.0s) volume: 92mL (However, the perfusion software identifies a 13 mL region of hypoperfusion within the posterior left MCA territory utilizing the Tmax >4.0 seconds threshold). Mismatch Volume: 17mL Infarction Location:None identified IMPRESSION: CTA neck: 1. The common carotid and internal carotid arteries are patent within the neck without hemodynamically significant stenosis (50% or greater). Atherosclerotic plaque within  the bilateral carotid systems within the neck, as described. 2. Streak and beam hardening artifact arising from a dense right-sided contrast bolus partially obscures the V1 right vertebral artery. Within this limitation, the vertebral arteries are patent within the neck without appreciable stenosis. 3. Likely non-acute penetrating atherosclerotic ulceration and/or chronic focal dissection within the lateral aortic arch. The aortic arch is nonaneurysmal at this level. 4. Aortic Atherosclerosis (ICD10-I70.0) and Emphysema (ICD10-J43.9). CTA head: 1. No intracranial large vessel occlusion or proximal high-grade arterial stenosis is identified. 2. Calcified plaque within the intracranial internal carotid arteries with no more than mild stenosis. CT perfusion head: The perfusion software identifies no core infarct. The perfusion software identifies no hypoperfused parenchyma utilizing the Tmax>6 seconds threshold. However, the perfusion software identifies a 13 mL region of hypoperfusion within the posterior left MCA territory utilizing the Tmax >4.0 seconds threshold. Electronically Signed: By: Kellie Simmering D.O. On: 06/08/2021 12:23    DISCHARGE EXAMINATION: Vitals:   06/10/21 1314 06/10/21 1324 06/10/21 1333  06/10/21 1557  BP: 119/73 (!) 110/92 100/66 102/66  Pulse: 87 85 82 91  Resp: (!) 28 20 20 18   Temp:    98.9 F (37.2 C)  TempSrc:    Oral  SpO2: 96% 96% 96% 95%  Weight:      Height:       General appearance: Awake alert.  In no distress Resp: Clear to auscultation bilaterally.  Normal effort Cardio: S1-S2 is normal regular.  No S3-S4.  No rubs murmurs or bruit GI: Abdomen is soft.  Nontender nondistended.  Bowel sounds are present normal.  No masses organomegaly   DISPOSITION: Home  Discharge Instructions     Ambulatory referral to Neurology   Complete by: As directed    An appointment is requested in approximately: 8 weeks for acute stroke   Ambulatory referral to Speech Therapy    Complete by: As directed    Stroke w aphasia   Call MD for:  difficulty breathing, headache or visual disturbances   Complete by: As directed    Call MD for:  extreme fatigue   Complete by: As directed    Call MD for:  persistant dizziness or light-headedness   Complete by: As directed    Call MD for:  persistant nausea and vomiting   Complete by: As directed    Call MD for:  severe uncontrolled pain   Complete by: As directed    Call MD for:  temperature >100.4   Complete by: As directed    Diet - low sodium heart healthy   Complete by: As directed    Discharge instructions   Complete by: As directed    Please take your medications as prescribed.  You were cared for by a hospitalist during your hospital stay. If you have any questions about your discharge medications or the care you received while you were in the hospital after you are discharged, you can call the unit and asked to speak with the hospitalist on call if the hospitalist that took care of you is not available. Once you are discharged, your primary care physician will handle any further medical issues. Please note that NO REFILLS for any discharge medications will be authorized once you are discharged, as it is imperative that you return to your primary care physician (or establish a relationship with a primary care physician if you do not have one) for your aftercare needs so that they can reassess your need for medications and monitor your lab values. If you do not have a primary care physician, you can call [phone] for a physician referral.   Increase activity slowly   Complete by: As directed           Allergies as of 06/10/2021       Reactions   Hydrocodone Nausea Only   Has problems with high doses        Medication List     STOP taking these medications    MSM PO   predniSONE 10 MG (21) Tbpk tablet Commonly known as: STERAPRED UNI-PAK 21 TAB       TAKE these medications    albuterol 108 (90  Base) MCG/ACT inhaler Commonly known as: VENTOLIN HFA Inhale 2 puffs into the lungs in the morning and at bedtime.   ALPRAZolam 1 MG tablet Commonly known as: XANAX Take 1 mg by mouth 3 (three) times daily as needed for anxiety.   aspirin EC 81 MG tablet Take 1 tablet (81 mg total) by mouth  daily for 21 days. Swallow whole.   buPROPion 300 MG 24 hr tablet Commonly known as: WELLBUTRIN XL Take 300 mg by mouth daily.   clopidogrel 75 MG tablet Commonly known as: Plavix Take 1 tablet (75 mg total) by mouth daily.   cyclobenzaprine 10 MG tablet Commonly known as: FLEXERIL Take 1 tablet (10 mg total) by mouth 3 (three) times daily as needed for muscle spasms.   furosemide 40 MG tablet Commonly known as: LASIX Take 40 mg by mouth daily.   gabapentin 300 MG capsule Commonly known as: NEURONTIN Take 300 mg by mouth daily as needed (For pain).   ibuprofen 200 MG tablet Commonly known as: ADVIL Take 400 mg by mouth every 6 (six) hours as needed for mild pain.   ipratropium-albuterol 0.5-2.5 (3) MG/3ML Soln Commonly known as: DUONEB Take 3 mLs by nebulization every 6 (six) hours as needed. What changed: reasons to take this   isosorbide mononitrate 30 MG 24 hr tablet Commonly known as: IMDUR Take 2 tablets (60 mg total) by mouth daily.   levothyroxine 112 MCG tablet Commonly known as: SYNTHROID TAKE 1 TABLET BY MOUTH EVERY MORNING   Linzess 145 MCG Caps capsule Generic drug: linaclotide Take 145 mcg by mouth daily.   MELATONIN PO Take 1 tablet by mouth at bedtime as needed (sleep).   metoprolol succinate 25 MG 24 hr tablet Commonly known as: TOPROL-XL Take 0.5 tablets (12.5 mg total) by mouth daily.   nitroGLYCERIN 0.3 MG SL tablet Commonly known as: NITROSTAT Place 0.3 mg under the tongue every 5 (five) minutes as needed for chest pain.   rosuvastatin 20 MG tablet Commonly known as: CRESTOR Take 1 tablet (20 mg total) by mouth daily.   triamcinolone cream 0.5  % Commonly known as: KENALOG Apply 1 application topically daily as needed (for sun exposure).          Follow-up [place]. Call.   Specialty: Rehabilitation Why: Call office to schedule Speech Therapy as soon as possible. A referral has been made electronically for you Contact information: [place]. [id number] [place]        Benito Mccreedy, MD. Schedule an appointment as soon as possible for a visit in 1 week(s).   Specialty: Internal Medicine Contact information: [address] [place] [address] [phone]         Angelia Mould, MD Follow up in 6 month(s).   Specialties: Vascular Surgery, Cardiology Contact information: [place] [address] [phone]         [place] Office Follow up.   Specialty: Cardiology Why: 06/23/21 @ 8:40AM, wound check visit (heart monitor) Contact information: [address] [place]                TOTAL DISCHARGE TIME: 35 minutes  [facility] Pager on [email or web address]  06/11/2021, 11:09 AM

## 2021-06-10 NOTE — Progress Notes (Signed)
STROKE TEAM PROGRESS NOTE   INTERVAL HISTORY Patient is lying comfortably in bed.  She is awaiting TEE later this morning.  She has no complaints.  TCD bubble study yesterday showed a small right to left shunt.  Lower extremity venous Dopplers are negative for DVT.  Neurological exam is unchanged.  Vital signs are stable. Vitals:   06/10/21 1305 06/10/21 1314 06/10/21 1324 06/10/21 1333  BP: 115/61 119/73 (!) 110/92 100/66  Pulse: 73 87 85 82  Resp: (!) 26 (!) 28 20 20   Temp: (!) 97.5 F (36.4 C)     TempSrc: Oral     SpO2: 96% 96% 96% 96%  Weight:      Height:       CBC:  Recent Labs  Lab 06/08/21 0925 06/08/21 0937  WBC 5.7  --   NEUTROABS 2.5  --   HGB 12.8 12.6  HCT 38.0 37.0  MCV 88.6  --   PLT 267  --    Basic Metabolic Panel:  Recent Labs  Lab 06/08/21 0925 06/08/21 0937 06/09/21 0355  NA 136 139 137  K 4.2 4.1 4.2  CL 100 101 102  CO2 25  --  25  GLUCOSE 97 98 101*  BUN 18 18 19   CREATININE 1.30* 1.40* 1.17*  CALCIUM 9.3  --  9.2    Lipid Panel:  Recent Labs  Lab 06/09/21 0355  CHOL 178  TRIG 98  HDL 46  CHOLHDL 3.9  VLDL 20  LDLCALC 112*    HgbA1c:  Recent Labs  Lab 06/09/21 0355  HGBA1C 6.2*   Urine Drug Screen: No results for input(s): LABOPIA, COCAINSCRNUR, LABBENZ, AMPHETMU, THCU, LABBARB in the last 168 hours.  Alcohol Level No results for input(s): ETH in the last 168 hours.  IMAGING past 24 hours ECHO TEE  Result Date: 06/10/2021    TRANSESOPHOGEAL ECHO REPORT   Patient Name:   Jocelyn [name] Date of Exam: 06/10/2021 Medical Rec #:  [id number]      Height:       64.0 in Accession #:    [id number]     Weight:       179.9 lb Date of Birth:  09-Oct-1960     BSA:          1.870 m Patient Age:    60 years       BP:           100/64 mmHg Patient Gender: F              HR:           88 bpm. Exam Location:  Inpatient Procedure: Transesophageal Echo, Color Doppler, Cardiac Doppler and Saline            Contrast Bubble Study Indications:      Stroke  History:         Patient has prior history of Echocardiogram examinations, most                  recent 06/08/2021. CHF, Previous Myocardial Infarction, TIA and                  COPD; Risk Factors:Hypertension.  Sonographer:     Bernadene Person RDCS Referring Phys:  [id number] Tami Lin [facility] Diagnosing Phys: Kirk Ruths MD PROCEDURE: After discussion of the risks and benefits of a TEE, an informed consent was obtained from the patient. The transesophogeal probe was passed without difficulty through the esophogus of the patient. Sedation  performed by different physician. The patient was monitored while under deep sedation. Anesthestetic sedation was provided intravenously by Anesthesiology: 246.88mg  of Propofol, 100mg  of Lidocaine. The patient's vital signs; including heart rate, blood pressure, and oxygen saturation; remained stable throughout the procedure. The patient developed no complications during the procedure. IMPRESSIONS  1. Lateral akinesis with overall mild LV dysfunction; negative saline microcavitation study.  2. Left ventricular ejection fraction, by estimation, is 40 to 45%. The left ventricle has mildly decreased function. The left ventricle demonstrates regional wall motion abnormalities (see scoring diagram/findings for description).  3. Right ventricular systolic function is normal. The right ventricular size is normal.  4. No left atrial/left atrial appendage thrombus was detected.  5. The mitral valve is normal in structure. Trivial mitral valve regurgitation.  6. The aortic valve is tricuspid. Aortic valve regurgitation is not visualized.  7. There is mild (Grade II) plaque involving the descending aorta.  8. Agitated saline contrast bubble study was negative, with no evidence of any interatrial shunt. FINDINGS  Left Ventricle: Left ventricular ejection fraction, by estimation, is 40 to 45%. The left ventricle has mildly decreased function. The left ventricle demonstrates regional  wall motion abnormalities. The left ventricular internal cavity size was normal in size. Right Ventricle: The right ventricular size is normal. Right ventricular systolic function is normal. Left Atrium: Left atrial size was normal in size. No left atrial/left atrial appendage thrombus was detected. Right Atrium: Right atrial size was normal in size. Pericardium: There is no evidence of pericardial effusion. Mitral Valve: The mitral valve is normal in structure. Trivial mitral valve regurgitation. Tricuspid Valve: The tricuspid valve is normal in structure. Tricuspid valve regurgitation is trivial. Aortic Valve: The aortic valve is tricuspid. Aortic valve regurgitation is not visualized. Pulmonic Valve: The pulmonic valve was normal in structure. Pulmonic valve regurgitation is trivial. Aorta: The aortic root is normal in size and structure. There is mild (Grade II) plaque involving the descending aorta. IAS/Shunts: The interatrial septum appears to be lipomatous. No atrial level shunt detected by color flow Doppler. Agitated saline contrast was given intravenously to evaluate for intracardiac shunting. Agitated saline contrast bubble study was negative,  with no evidence of any interatrial shunt. Additional Comments: Lateral akinesis with overall mild LV dysfunction; negative saline microcavitation study. Kirk Ruths MD Electronically signed by Kirk Ruths MD Signature Date/Time: 06/10/2021/2:12:16 PM    Final     PHYSICAL EXAM Pleasant frail middle-aged African-American lady not in distress. . Afebrile. Head is nontraumatic. Neck is supple without bruit.    Cardiac exam no murmur or gallop. Lungs are clear to auscultation. Distal pulses are well felt.   Neuro: Mental Status: Patient is awake, alert, oriented to person, month, and age  Patient is able to give some history but has mild expressive aphasia with word finding difficulties and speech hesitancy.  She has difficulty with repetition and  intermittently has difficulty following some commands. No evidence of neglect Cranial Nerves: II: Visual Fields are full.  III,IV, VI: EOMI without ptosis or diploplia.  V: Facial sensation is symmetric to light touch VII: Facial movement is symmetric.  VIII: hearing is intact to voice X: Uvula elevates symmetrically XI: Shoulder shrug is symmetric. XII: tongue is midline without atrophy or fasciculations.  Motor: Tone is normal. Bulk is normal.  She does not have pronator drift of bilateral upper extremities and can maintain bilateral lower extremities antigravity Sensory: She is able to correctly report light touch on all 4 extremities and does not  extinguish to double sided stimulus Deep Tendon Reflexes: 2+ and symmetric in the brachioradialis and patellae.  Cerebellar: FNF and HKS are intact bilaterally    ASSESSMENT/PLAN Ms. TAYLORE HINDE is a 60 y.o. female with history of   hypertension, hyperlipidemia, hypothyroidism, prior LV thrombus, coronary artery disease, COPD, prior smoking (quit 1 month ago), aortic atherosclerosis, anxiety/depression.   She has been in her usual state of health and family last spoke to her at 33 PM at which time she was speaking normally.  When her daughter called her at 8 AM she was noted to have speech that did not make sense.  She therefore presented to the ED for evaluation.  Initial head CT was negative.  Once neurology was consulted, evaluation was expedited and given patient could potentially meet wake-up stroke criteria code stroke was activated by myself.  At baseline the patient lives independently.   And chart review regarding her LV thrombus, it appears that that this was felt to be in the setting of smoking and estrogen use, with the patient questioning a role of COVID-19 vaccination that she had received shortly before she was diagnosed.  She had been on warfarin but had been eager to discontinue this medication and it was discontinued with  cessation of her estrogen and smoking cessation.  MRI brain shows left parietal and superior temporal lobe infarcts- left MCA territory.     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  However present 2D echo shows no definite thrombus despite patient being off anticoagulation    CT head No acute abnormality.  Small vessel disease. Atrophy.  ASPECTS 10.     CTA head  1. No intracranial large vessel occlusion or proximal high-grade arterial stenosis is identified. 2. Calcified plaque within the intracranial internal carotid arteries with no more than mild stenosis. CTA neck    1. The common carotid and internal carotid arteries are patent within the neck without hemodynamically significant stenosis (50% or greater). Atherosclerotic plaque within the bilateral carotid systems within the neck, as described. 2. Streak and beam hardening artifact arising from a dense right-sided contrast bolus partially obscures the V1 right vertebral artery. Within this limitation, the vertebral arteries are patent within the neck without appreciable stenosis. 3. Likely non-acute penetrating atherosclerotic ulceration and/or chronic focal dissection within the lateral aortic arch. The aortic arch is nonaneurysmal at this level. 4. Aortic Atherosclerosis (ICD10-I70.0) and Emphysema (ICD10-J43.9).   CT perfusion   The perfusion software identifies no core infarct. The perfusion software identifies no hypoperfused parenchyma utilizing the Tmax>6 seconds threshold. However, the perfusion software identifies a 13 mL region of hypoperfusion within the posterior left MCA territory utilizing the Tmax >4.0 seconds threshold.    MRI  BRAIN: 1. Small acute cortical infarcts in the left parietal and superior temporal lobe, likely distal left MCA territory. Faint edema without mass effect. 2. Mild chronic microvascular  disease.  2D Echo  There is no left ventricular thrombus with Definity contrast. The left  ventricle has mildly decreased function. The LV thrombus described in 2021  is no longer present.    LDL 112 HgbA1c 6.2 VTE prophylaxis - scd    Diet   Diet Heart Room service appropriate? Yes; Fluid consistency: Thin   aspirin 81 mg daily prior to admission, now on aspirin 81 mg and Plavix 75 mg daily.  For 3 weeks followed by Plavix alone Therapy recommendations:  pending Disposition:  pending  Hypertension Home meds:  lasix, imdur, metoprolol Stable Permissive hypertension (OK if < 220/120) but gradually normalize in 5-7 days Long-term BP goal normotensive  Hyperlipidemia Home meds:  crestor 20mg ,  resumed in hospital LDL 112, goal < 70  Continue statin at discharge  HgbA1c 6.2, goal < 7.0 CBGs Recent Labs    06/08/21 0937  GLUCAP 104*    SSI  Other Stroke Risk Factors  CAD  Cigarette smoker advised to stop smoking  Chronic combined Congestive heart failure     Obesity, Body mass index is 30.88 kg/m., BMI >/= 30 associated with increased stroke risk, recommend weight loss, diet and exercise as appropriate   History of LV thrombus off anticoagulation   Other Active Problems Anxiety/depression hypothyroidism  Hospital day # 1  .  Recommend check TEE to look for cardiac thrombus and if unyielding may need loop recorder at discharge for paroxysmal A. fib.  TCD bubble study shows a small PFO and lower extremity venous Dopplers are negative for DVT.  Patient has a low rope score and does not need endovascular PFO closure given the small size of the shunt.  .  Recommend aspirin and Plavix for 3 weeks followed by Plavix alone and aggressive risk factor modification.  Check urine drug screen and anticardiolipin antibodies.  Long discussion with patient and husband and answered questions.  Discussed with Dr. [place] Pink.  Greater than 50% time during this 25-minute visit was spent in  counseling and coordination of care and discussion with care team and answering questions embolic stroke.  Follow-up as an outpatient stroke clinic in 2 months  Antony Contras, [place] Pager: [phone] 06/10/2021 3:38 PM     To contact Stroke Continuity provider, please refer to [email or web address] After hours, contact General Neurology

## 2021-06-10 NOTE — Interval H&P Note (Signed)
History and Physical Interval Note:  06/10/2021 12:15 PM  Jocelyn [name]  has presented today for surgery, with the diagnosis of STROKE.  The various methods of treatment have been discussed with the patient and family. After consideration of risks, benefits and other options for treatment, the patient has consented to  Procedure(s): TRANSESOPHAGEAL ECHOCARDIOGRAM (TEE) (N/A) as a surgical intervention.  The patient's history has been reviewed, patient examined, no change in status, stable for surgery.  I have reviewed the patient's chart and labs.  Questions were answered to the patient's satisfaction.     Kirk Ruths

## 2021-06-10 NOTE — Progress Notes (Signed)
  Echocardiogram Echocardiogram Transesophageal has been performed.  Jocelyn [name] 06/10/2021, 1:17 PM

## 2021-06-10 NOTE — Transfer of Care (Signed)
Immediate Anesthesia Transfer of Care Note  Patient: Jocelyn [name]  Procedure(s) Performed: TRANSESOPHAGEAL ECHOCARDIOGRAM (TEE) BUBBLE STUDY  Patient Location: Endoscopy Unit  Anesthesia Type:MAC  Level of Consciousness: drowsy  Airway & Oxygen Therapy: Patient Spontanous Breathing  Post-op Assessment: Report given to RN and Post -op Vital signs reviewed and stable  Post vital signs: Reviewed and stable  Last Vitals:  Vitals Value Taken Time  BP 115/61 06/10/21 1304  Temp    Pulse 88 06/10/21 1309  Resp 30 06/10/21 1309  SpO2 95 % 06/10/21 1309  Vitals shown include unvalidated device data.  Last Pain:  Vitals:   06/10/21 1210  TempSrc: Oral  PainSc: 0-No pain      Patients Stated Pain Goal: 0 ([id number])  Complications: No notable events documented.

## 2021-06-10 NOTE — Interval H&P Note (Signed)
History and Physical Interval Note:  06/10/2021 5:04 PM  Jocelyn [name]  has presented today for surgery, with the diagnosis of stroke.  The various methods of treatment have been discussed with the patient and family. After consideration of risks, benefits and other options for treatment, the patient has consented to  Procedure(s): LOOP RECORDER INSERTION (N/A) as a surgical intervention.  The patient's history has been reviewed, patient examined, no change in status, stable for surgery.  I have reviewed the patient's chart and labs.  Questions were answered to the patient's satisfaction.     Thompson Grayer

## 2021-06-10 NOTE — Progress Notes (Signed)
    Transesophageal Echocardiogram Note  Jocelyn [name] [id number] Apr 04, 1961  Procedure: Transesophageal Echocardiogram Indications: CVA  Procedure Details Consent: Obtained Time Out: Verified patient identification, verified procedure, site/side was marked, verified correct patient position, special equipment/implants available, Radiology Safety Procedures followed,  medications/allergies/relevent history reviewed, required imaging and test results available.  Performed  Medications:  Pt sedated by anesthesia with lidocaine 100 mg and diprovan 245 mg IV.  Akinesis of the lateral wall with mild LV dysfunction; no LAA thrombus; negative saline microcavitation study.   Complications: No apparent complications Patient did tolerate procedure well.  Kirk Ruths, MD

## 2021-06-10 NOTE — H&P (View-Only) (Signed)
ELECTROPHYSIOLOGY CONSULT NOTE  Patient ID: Jocelyn [name] MRN: [id number], DOB/AGE: [birth year]   Admit date: 06/08/2021 Date of Consult: 06/10/2021  Primary Physician: Benito Mccreedy, MD Primary Cardiologist: Dr. Irish Lack Reason for Consultation: Cryptogenic stroke ; recommendations regarding Implantable Loop Recorder, requested by Dr. Leonie Man  History of Present Illness: Jocelyn [name] was admitted on 06/08/2021 with headache and word finding difficulty.    PMHx includes: CAD (known CTO OM by cath 2017), Oct 2021 she had splenic/renal infarct and w/u found LV thrombus, she was treated with warfarin f/u echo jan 2022 with resolution, LVEF 45%.  At the time of her thrombus also was on estrogen + smoking, and recently had Moderna covid vaccine, with some suspicion that these 3 things may have played a role, after >50mo of a/c therapy warfarin was stopped. HTN, HLD, hypothyroidism, COPD, + smoker until a month ago, anxiety/depresison  Neurology notes:  left parietal and temporal lobe infarct embolic secondary to likely thromboembolic source in a patient with recent LV thrombus.  However present 2D echo shows no definite thrombus despite patient being off anticoagulation.  she has undergone workup for stroke including echocardiogram and carotid angio.  The patient has been monitored on telemetry which has demonstrated sinus rhythm with no arrhythmias.    05/10/21; TEE IMPRESSIONS   1. Lateral akinesis with overall mild LV dysfunction; negative saline  microcavitation study.   2. Left ventricular ejection fraction, by estimation, is 40 to 45%. The  left ventricle has mildly decreased function. The left ventricle  demonstrates regional wall motion abnormalities (see scoring  diagram/findings for description).   3. Right ventricular systolic function is normal. The right ventricular  size is normal.   4. No left atrial/left atrial appendage thrombus was detected.   5. The mitral valve is  normal in structure. Trivial mitral valve  regurgitation.   6. The aortic valve is tricuspid. Aortic valve regurgitation is not  visualized.   7. There is mild (Grade II) plaque involving the descending aorta.   8. Agitated saline contrast bubble study was negative, with no evidence  of any interatrial shunt.    06/08/21: TTE (limited) IMPRESSIONS   1. There is no left ventricular thrombus with Definity contrast. The left  ventricle has mildly decreased function. There is moderate hypokinesis of  the left ventricular, basal-mid inferior wall, inferoseptal wall and  inferolateral wall. Findings suggest   old infarction in the left circumflex coronary distribution.   Comparison(s): A prior study was performed on 05/19/2021 and 05/16/2020.  Prior images reviewed side by side. The left ventricular function is  unchanged. The left ventricular wall motion abnormality is unchanged. The  current study was limited to LV wall  motion and systolic function assessment. The LV thrombus described in 2021  is no longer present.    05/19/21: TTE IMPRESSIONS   1. Left ventricular ejection fraction, by estimation, is 40 to 45%. The  left ventricle has mildly decreased function. The left ventricle  demonstrates global hypokinesis. Left ventricular diastolic parameters are  consistent with Grade I diastolic  dysfunction (impaired relaxation).   2. Right ventricular systolic function was not well visualized. The right  ventricular size is normal. Tricuspid regurgitation signal is inadequate  for assessing PA pressure.   3. The mitral valve is normal in structure. No evidence of mitral valve  regurgitation. No evidence of mitral stenosis.   4. The aortic valve was not well visualized. Aortic valve regurgitation  is not visualized. No aortic stenosis  is present.   5. The inferior vena cava is normal in size with greater than 50%  respiratory variability, suggesting right atrial pressure of 3 mmHg.   . 06/09/21: LE venous US Summary:  BILATERAL:  - No evidence of deep vein thrombosis seen in the lower extremities,  bilaterally.  -No evidence of popliteal cyst, bilaterally.   Lab work is reviewed.    Prior to admission, the patient denies chest pain, shortness of breath, dizziness, palpitations, or syncope.  They are recovering from their stroke with plans to home at discharge. The patient and her husband state she is near baseline with only occasional word finding hesitation/mistake.      Past Medical History:  Diagnosis Date   Anxiety    Arthritis    Chronic diastolic CHF (congestive heart failure), NYHA class 2 (HCC)    COPD (chronic obstructive pulmonary disease) (HCC)    Depression    HTN (hypertension)    Hypercholesterolemia    Hypothyroidism (acquired)    Kidney stones 2015   Myocardial infarction [facility])    x3- pt unsure if MIs were in 1996 or 1999, had stent fixed in 2016 but no MI at that time per pt   Thyrotoxicosis    TIA (transient ischemic attack)    2013     Surgical History:  Past Surgical History:  Procedure Laterality Date   BREAST CYST EXCISION     CARDIAC CATHETERIZATION N/A 08/24/2015   Procedure: Left Heart Cath and Coronary Angiography;  Surgeon: Jettie Booze, MD;  Location: [facility];  Service: Cardiovascular;  Laterality: N/A;   COLONOSCOPY     CORONARY STENT PLACEMENT  1999   and then had procedure to repair in 2016   KNEE SURGERY     partial hysterec     THYROIDECTOMY       Medications Prior to Admission  Medication Sig Dispense Refill Last Dose   albuterol (PROVENTIL HFA;VENTOLIN HFA) 108 (90 Base) MCG/ACT inhaler Inhale 2 puffs into the lungs in the morning and at bedtime.   Past Week   ALPRAZolam (XANAX) 1 MG tablet Take 1 mg by mouth 3 (three) times daily as needed for anxiety.   Past Week   buPROPion (WELLBUTRIN XL) 300 MG 24 hr tablet Take 300 mg by mouth daily.   Past Week   cyclobenzaprine (FLEXERIL) 10 MG  tablet Take 1 tablet (10 mg total) by mouth 3 (three) times daily as needed for muscle spasms. 21 tablet 0 unk   furosemide (LASIX) 40 MG tablet Take 40 mg by mouth daily.   Past Week   gabapentin (NEURONTIN) 300 MG capsule Take 300 mg by mouth daily as needed (For pain).   UNK   ibuprofen (ADVIL) 200 MG tablet Take 400 mg by mouth every 6 (six) hours as needed for mild pain.   Past Week   ipratropium-albuterol (DUONEB) 0.5-2.5 (3) MG/3ML SOLN Take 3 mLs by nebulization every 6 (six) hours as needed. (Patient taking differently: Take 3 mLs by nebulization every 6 (six) hours as needed (shortness of breath, wheezing).) 360 mL 1 Past Week   isosorbide mononitrate (IMDUR) 30 MG 24 hr tablet Take 2 tablets (60 mg total) by mouth daily. 30 tablet 5 Past Week   levothyroxine (SYNTHROID) 112 MCG tablet TAKE 1 TABLET BY MOUTH EVERY MORNING 90 tablet 3 Past Week   LINZESS 145 MCG CAPS capsule Take 145 mcg by mouth daily.   Past Week   MELATONIN PO Take 1 tablet  by mouth at bedtime as needed (sleep).   Past Week   Methylsulfonylmethane (MSM PO) Take 1 tablet by mouth daily.   Past Week   metoprolol succinate (TOPROL-XL) 25 MG 24 hr tablet Take 0.5 tablets (12.5 mg total) by mouth daily. 30 tablet 0 Past Week   nitroGLYCERIN (NITROSTAT) 0.3 MG SL tablet Place 0.3 mg under the tongue every 5 (five) minutes as needed for chest pain.  0 unk   triamcinolone cream (KENALOG) 0.5 % Apply 1 application topically daily as needed (for sun exposure).   Past Month   [DISCONTINUED] aspirin EC 81 MG tablet Take 1 tablet (81 mg total) by mouth daily. Swallow whole. 90 tablet 3 Past Week   predniSONE (STERAPRED UNI-PAK 21 TAB) 10 MG (21) TBPK tablet Use per package instructions (Patient not taking: No sig reported) 21 tablet 0 Completed Course   [DISCONTINUED] rosuvastatin (CRESTOR) 20 MG tablet Take 1 tablet (20 mg total) by mouth daily. (Patient not taking: No sig reported) 90 tablet 3 Not Taking    Inpatient Medications:    aspirin  325 mg Oral Daily   enoxaparin (LOVENOX) injection  40 mg Subcutaneous Q24H   levothyroxine  112 mcg Oral q morning   linaclotide  145 mcg Oral Daily   rosuvastatin  20 mg Oral Daily    Allergies:  Allergies  Allergen Reactions   Hydrocodone Nausea Only    Has problems with high doses    Social History   Socioeconomic History   Marital status: Legally Separated    Spouse name: Not on file   Number of children: 2   Years of education: Not on file   Highest education level: Not on file  Occupational History   Occupation: [employer]  Tobacco Use   Smoking status: Light Smoker    Packs/day: 0.50    Types: Cigarettes   Smokeless tobacco: Never  Vaping Use   Vaping Use: Never used  Substance and Sexual Activity   Alcohol use: No    Alcohol/week: 0.0 standard drinks   Drug use: No   Sexual activity: Not on file  Other Topics Concern   Not on file  Social History Narrative   Not on file   Social Determinants of Health   Financial Resource Strain: Not on file  Food Insecurity: Not on file  Transportation Needs: Not on file  Physical Activity: Not on file  Stress: Not on file  Social Connections: Not on file  Intimate Partner Violence: Not on file     Family History  Problem Relation Age of Onset   Heart disease Mother    Stroke Mother    Hypertension Mother    Heart disease Father    Cancer Father    Berenice Primas' disease Father    Colon cancer Father        pt unsure of age onset, believes he was in his 63s   Gout Brother    Gout Brother    Cancer Brother    Stroke Brother    Diabetes Maternal Grandmother    Hypertension Maternal Grandmother    Heart attack Neg Hx    Esophageal cancer Neg Hx    Stomach cancer Neg Hx    Rectal cancer Neg Hx       Review of Systems: All other systems reviewed and are otherwise negative except as noted above.  Physical Exam: Vitals:   06/10/21 1305 06/10/21 1314 06/10/21 1324 06/10/21 1333  BP: 115/61 119/73 (!)  110/92 100/66  Pulse: 73 87 85 82  Resp: (!) 26 (!) 28 20 20   Temp: (!) 97.5 F (36.4 C)     TempSrc: Oral     SpO2: 96% 96% 96% 96%  Weight:      Height:        GEN- The patient is well appearing, alert and oriented x 3 today.   Head- normocephalic, atraumatic Eyes-  Sclera clear, conjunctiva pink Ears- hearing intact Oropharynx- clear Neck- supple Lungs- CTA b/l, normal work of breathing Heart- RRR, no murmurs, rubs or gallops  GI- soft, NT, ND Extremities- no clubbing, cyanosis, or edema MS- no significant deformity or atrophy Skin- no rash or lesion Psych- euthymic mood, full affect   Labs:   Lab Results  Component Value Date   WBC 5.7 06/08/2021   HGB 12.6 06/08/2021   HCT 37.0 06/08/2021   MCV 88.6 06/08/2021   PLT 267 06/08/2021    Recent Labs  Lab 06/08/21 0925 06/08/21 0937 06/09/21 0355  NA 136   < > 137  K 4.2   < > 4.2  CL 100   < > 102  CO2 25  --  25  BUN 18   < > 19  CREATININE 1.30*   < > 1.17*  CALCIUM 9.3  --  9.2  PROT 6.4*  --   --   BILITOT 0.9  --   --   ALKPHOS 61  --   --   ALT 30  --   --   AST 20  --   --   GLUCOSE 97   < > 101*   < > = values in this interval not displayed.   Lab Results  Component Value Date   TROPONINI <0.03 11/04/2017   Lab Results  Component Value Date   CHOL 178 06/09/2021   CHOL 286 (H) 10/29/2020   CHOL 218 (H) 09/07/2020   Lab Results  Component Value Date   HDL 46 06/09/2021   HDL 55 10/29/2020   HDL 63 09/07/2020   Lab Results  Component Value Date   LDLCALC 112 (H) 06/09/2021   LDLCALC 207 (H) 10/29/2020   LDLCALC 132 (H) 09/07/2020   Lab Results  Component Value Date   TRIG 98 06/09/2021   TRIG 134 10/29/2020   TRIG 133 09/07/2020   Lab Results  Component Value Date   CHOLHDL 3.9 06/09/2021   CHOLHDL 5.2 (H) 10/29/2020   CHOLHDL 3.5 09/07/2020   No results found for: LDLDIRECT  Lab Results  Component Value Date   DDIMER 0.65 (H) 05/16/2021     Radiology/Studies:  CT  HEAD WO CONTRAST Result Date: 06/08/2021 CLINICAL DATA:  Neuro deficit, acute, stroke suspected EXAM: CT HEAD WITHOUT CONTRAST TECHNIQUE: Contiguous axial images were obtained from the base of the skull through the vertex without intravenous contrast. COMPARISON:  08/20/2016. FINDINGS: Brain: No evidence of acute infarction, hemorrhage, hydrocephalus, extra-axial collection or mass lesion/mass effect. Vascular: No hyperdense vessel identified. Calcific intracranial atherosclerosis. Skull: No evidence of acute fracture. Sinuses/Orbits: Mild paranasal sinus mucosal thickening. Unremarkable orbits. Other: No mastoid effusions. IMPRESSION: No evidence of acute intracranial abnormality. Electronically Signed   By: Margaretha Sheffield M.D.   On: 06/08/2021 10:11   CT Angio Chest Pulmonary Embolism (PE) W or WO Contrast Result Date: 05/16/2021 CLINICAL DATA:  Shortness of breath and positive D-dimer EXAM: CT ANGIOGRAPHY CHEST WITH CONTRAST TECHNIQUE: Multidetector CT imaging of the chest was performed using the standard protocol during bolus administration of intravenous  contrast. Multiplanar CT image reconstructions and MIPs were obtained to evaluate the vascular anatomy. CONTRAST:  15mL OMNIPAQUE IOHEXOL 350 MG/ML SOLN COMPARISON:  Chest x-ray from the previous day. FINDINGS: Cardiovascular: Thoracic aorta and its branches demonstrate atherosclerotic calcification without aneurysmal dilatation or dissection. Pulmonary artery shows a normal branching pattern. No filling defect to suggest pulmonary embolism is noted. Mild coronary calcifications are seen. No cardiac enlargement is noted. Mediastinum/Nodes: Thoracic inlet is within normal limits. The esophagus as visualized is within normal limits. No sizable hilar or mediastinal adenopathy is noted. Lungs/Pleura: Lungs are well aerated bilaterally. Mild emphysematous changes are seen. Patchy ground-glass airspace opacities are noted bilaterally predominately within the  upper lobes consistent with postinflammatory change. No sizable effusion is noted. No sizable parenchymal nodules are seen. Upper Abdomen: Visualized upper abdomen shows no acute abnormality. Musculoskeletal: Degenerative changes of the thoracic spine are noted. No acute rib abnormality is noted. Review of the MIP images confirms the above findings. IMPRESSION: No evidence of pulmonary emboli. Patchy ground-glass opacities consistent with a postinflammatory nature predominately within the upper lobes. No other focal abnormality is seen. Electronically Signed   By: Inez Catalina M.D.   On: 05/16/2021 23:23   MR BRAIN WO CONTRAST Result Date: 06/08/2021 CLINICAL DATA:  Neuro deficit, acute, stroke suspected EXAM: MRI HEAD WITHOUT CONTRAST TECHNIQUE: Multiplanar, multiecho pulse sequences of the brain and surrounding structures were obtained without intravenous contrast. COMPARISON:  Same day CT/CTA. FINDINGS: Brain: Small acute cortical infarcts in the left parietal and superior temporal lobe, likely distal left MCA territory. Faint edema without mass effect. No midline shift. Basal cisterns are patent. Otherwise, mild scattered T2 hyperintensities in the white matter, nonspecific but compatible with chronic microvascular ischemic disease. No hydrocephalus, mass lesion, extra-axial fluid collection, or acute hemorrhage. Vascular: Better evaluated on concurrent CTA. Skull and upper cervical spine: Heterogeneous marrow without focal suspicious lesion. Sinuses/Orbits: Minimal paranasal sinus mucosal thickening. Unremarkable orbits. Other: No sizable mastoid effusions. IMPRESSION: 1. Small acute cortical infarcts in the left parietal and superior temporal lobe, likely distal left MCA territory. Faint edema without mass effect. 2. Mild chronic microvascular disease. Electronically Signed   By: Margaretha Sheffield M.D.   On: 06/08/2021 12:20   DG CHEST PORT 1 VIEW Result Date: 05/18/2021 CLINICAL DATA:  Respiratory  distress EXAM: PORTABLE CHEST 1 VIEW COMPARISON:  05/16/2021 CT FINDINGS: Cardiac shadow is within normal limits. The lungs are well aerated bilaterally. The changes seen on recent CT are not well appreciated on plain film examination. No sizable effusion is seen. No bony abnormality is noted. IMPRESSION: No acute abnormality noted. The findings on recent chest CT are not well appreciated on this study. Electronically Signed   By: Inez Catalina M.D.   On: 05/18/2021 00:17      VAS [place] TRANSCRANIAL DOPPLER W BUBBLES Result Date: 06/09/2021  Transcranial Doppler with Bubble Patient Name:  AZALYA GALYON Sohn  Date of Exam:   06/09/2021 Medical Rec #: [id number]       Accession #:    [id number] Date of Birth: 1961-03-29      Patient Gender: F Patient Age:   63 years Exam Location:  [facility] Procedure:      VAS [place] TRANSCRANIAL DOPPLER W BUBBLES Referring Phys: Lelan Pons FRANCOIS --------------------------------------------------------------------------------  Indications: Stroke. Comparison Study: no prior Performing Technologist: Archie Patten RVS  Examination Guidelines: A complete evaluation includes B-mode imaging, spectral Doppler, color Doppler, and power Doppler as needed of all accessible portions of each vessel. Bilateral  testing is considered an integral part of a complete examination. Limited examinations for reoccurring indications may be performed as noted.  Summary:  A vascular evaluation was performed. The right middle cerebral artery was studied. An IV was inserted into the patient's right AC . Verbal informed consent was obtained.  HITS heard at rest and during valsalva: Spencer grade 3 *See table(s) above for TCD measurements and observations.    Preliminary       VAS [place] LOWER EXTREMITY VENOUS (DVT) Result Date: 06/09/2021  Lower Venous DVT Study Patient Name:  BENNETTA RUDDEN Buttram  Date of Exam:   06/09/2021 Medical Rec #: [id number]       Accession #:    [id number] Date of Birth: [birth year]       Patient Gender: F Patient Age:   20 years Exam Location:  [facility] Procedure:      VAS [place] LOWER EXTREMITY VENOUS (DVT) Referring Phys: PRAMOD SETHI --------------------------------------------------------------------------------  Indications: Stroke.  Comparison Study: no prior Performing Technologist: Archie Patten RVS  Examination Guidelines: A complete evaluation includes B-mode imaging, spectral Doppler, color Doppler, and power Doppler as needed of all accessible portions of each vessel. Bilateral testing is considered an integral part of a complete examination. Limited examinations for reoccurring indications may be performed as noted. The reflux portion of the exam is performed with the patient in reverse Trendelenburg.    Summary: BILATERAL: - No evidence of deep vein thrombosis seen in the lower extremities, bilaterally. -No evidence of popliteal cyst, bilaterally.   *See table(s) above for measurements and observations.    Preliminary      CT ANGIO HEAD NECK W WO CM W PERF (CODE STROKE) Addendum Date: 06/08/2021   ADDENDUM REPORT: 06/08/2021 12:59 ADDENDUM: CTA neck impression #3 and CTA head impression #1 called by telephone at the time of interpretation on 06/08/2021 at 12:23 pm to provider Dr. Roslynn Amble, who verbally acknowledged these results. Electronically Signed   By: Kellie Simmering D.O.   On: 06/08/2021 12:59   Result Date: 06/08/2021 CLINICAL DATA:  Neuro deficit, acute, stroke suspected. Additional history provided: Aphasia. EXAM: CT ANGIOGRAPHY HEAD AND NECK CT PERFUSION BRAIN TECHNIQUE: Multidetector CT imaging of the head and neck was performed using the standard protocol during bolus administration of intravenous contrast. Multiplanar CT image reconstructions and MIPs were obtained to evaluate the vascular anatomy. Carotid stenosis measurements (when applicable) are obtained utilizing NASCET criteria, using the distal internal carotid diameter as the denominator. Multiphase CT  imaging of the brain was performed following IV bolus contrast injection. Subsequent parametric perfusion maps were calculated using RAPID software. CONTRAST:  165mL OMNIPAQUE IOHEXOL 350 MG/ML SOLN COMPARISON:  Noncontrast head CT performed earlier today 06/08/2021. FINDINGS: CTA NECK FINDINGS Aortic arch: Standard aortic branching. Soft and calcified plaque within the visualized aortic arch and proximal major branch vessels of the neck. Probable non-acute penetrating atherosclerotic ulceration and/or chronic focal dissection of the lateral arch (for instance as seen on series 7, images 304 and 305) (series 10, image 168). The arch is nonaneurysmal at this level, measuring 2.5 cm in diameter. No hemodynamically significant innominate or proximal subclavian artery stenosis. Right carotid system: CCA and ICA patent within the neck without significant stenosis (50% or greater). Mild atherosclerotic plaque within the distal CCA, carotid bifurcation and proximal ICA. Left carotid system: CCA and ICA patent within the neck without significant stenosis (50% or greater). Mild to moderate predominantly calcified plaque within the distal CCA, carotid bifurcation and proximal ICA. Vertebral arteries:  Streak and beam hardening artifact arising from a dense right-sided contrast bolus partially obscures the V1 right vertebral artery. Within this limitation, the vertebral arteries are patent within the neck without appreciable stenosis. Minimal nonstenotic calcified plaque within the distal cervical left vertebral artery. Skeleton: Cervical spondylosis. No acute bony abnormality or aggressive osseous lesion partially imaged thoracic levocurvature. Other neck: 11 mm right thyroid lobe nodule. The left thyroid lobe is poorly delineated, markedly atrophic or surgically absent. Upper chest: The centrilobular and paraseptal emphysema. No consolidation within the imaged lung apices. Review of the MIP images confirms the above findings  CTA HEAD FINDINGS Anterior circulation: The intracranial internal carotid arteries are patent. Mild calcified plaque within both vessels without stenosis. The M1 middle cerebral arteries are patent. No M2 proximal branch occlusion or high-grade proximal stenosis is identified. The anterior cerebral arteries are patent. No intracranial aneurysm is identified. Posterior circulation: The intracranial vertebral arteries are patent. The basilar artery is patent. The posterior cerebral arteries are patent. The posterior cerebral arteries are fetal in origin bilaterally. Venous sinuses: Within the limitations of contrast timing, no convincing thrombus. Anatomic variants: As described. Review of the MIP images confirms the above findings CT Brain Perfusion Findings: CBF (<30%) Volume: 46mL Perfusion (Tmax>6.0s) volume: 60mL (However, the perfusion software identifies a 13 mL region of hypoperfusion within the posterior left MCA territory utilizing the Tmax >4.0 seconds threshold). Mismatch Volume: 37mL Infarction Location:None identified IMPRESSION: CTA neck: 1. The common carotid and internal carotid arteries are patent within the neck without hemodynamically significant stenosis (50% or greater). Atherosclerotic plaque within the bilateral carotid systems within the neck, as described. 2. Streak and beam hardening artifact arising from a dense right-sided contrast bolus partially obscures the V1 right vertebral artery. Within this limitation, the vertebral arteries are patent within the neck without appreciable stenosis. 3. Likely non-acute penetrating atherosclerotic ulceration and/or chronic focal dissection within the lateral aortic arch. The aortic arch is nonaneurysmal at this level. 4. Aortic Atherosclerosis (ICD10-I70.0) and Emphysema (ICD10-J43.9). CTA head: 1. No intracranial large vessel occlusion or proximal high-grade arterial stenosis is identified. 2. Calcified plaque within the intracranial internal carotid  arteries with no more than mild stenosis. CT perfusion head: The perfusion software identifies no core infarct. The perfusion software identifies no hypoperfused parenchyma utilizing the Tmax>6 seconds threshold. However, the perfusion software identifies a 13 mL region of hypoperfusion within the posterior left MCA territory utilizing the Tmax >4.0 seconds threshold. Electronically Signed: By: Kellie Simmering D.O. On: 06/08/2021 12:23    12-lead ECG SR All prior EKG's in EPIC reviewed with no documented atrial fibrillation  Telemetry SR  Assessment and Plan:  1. Cryptogenic stroke The patient presents with cryptogenic stroke.  The patient had a TEE today.  Post TEE  The patient is AAO x4 She is able to tell me her name, the date, where she is and why.  Her husband is at bedside and both mention Dr. Clydene Fake discussion the other day about loop implant recommendation and rational for it I spoke at length with the patient and her husband bedside about monitoring for afib with either a 30 day event monitor or an implantable loop recorder.  We discuss rational for monitoring for AFib in environment of a stroke.  We  Risks, benefits, and alteratives to implantable loop recorder were discussed with the patient today.   At this time, the patient and her husband are very clear in their decision to proceed with implantable loop recorder and proceeding today  prior to discharge.   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  Wound check follow up will be scheduled for the patient.  Please call with questions.   Renee Dyane Dustman, PA-C 06/10/2021   I have seen, examined the patient, and reviewed the above assessment and plan.  Changes to above are made where necessary.   On exam, RRR.  The patient presents with cryptogenic stroke.  No causes have been found.   I spoke at length with the patient about monitoring for afib with either a 30 day event monitor or an implantable loop recorder.  Risks, benefits, and alteratives to implantable loop recorder were discussed with the patient today.   At this time, the patient is very clear in their decision to proceed with implantable loop recorder.   Please call with questions.   Co Sign: Thompson Grayer, MD 06/10/2021 5:03 PM

## 2021-06-10 NOTE — Discharge Instructions (Signed)
Post procedure (heart monitor)  wound care instructions Keep incision clean and dry for 3 days. You can remove outer dressing tomorrow. Leave steri-strips (little pieces of tape) on until seen in the office for wound check appointment. Call the office [phone]) for redness, drainage, swelling, or fever.

## 2021-06-10 NOTE — Progress Notes (Signed)
Pt wheeled off unit by this RN. All belongings in the bags in her lap. IV removed CCMD notified of pts discharge

## 2021-06-10 NOTE — Consult Note (Addendum)
ELECTROPHYSIOLOGY CONSULT NOTE  Patient ID: Jocelyn [name] MRN: [id number], DOB/AGE: 09-Mar-1961   Admit date: 06/08/2021 Date of Consult: 06/10/2021  Primary Physician: Benito Mccreedy, MD Primary Cardiologist: Dr. Irish Lack Reason for Consultation: Cryptogenic stroke ; recommendations regarding Implantable Loop Recorder, requested by Dr. Leonie Man  History of Present Illness: Jocelyn [name] was admitted on 06/08/2021 with headache and word finding difficulty.    PMHx includes: CAD (known CTO OM by cath 2017), Oct 2021 she had splenic/renal infarct and w/u found LV thrombus, she was treated with warfarin f/u echo jan 2022 with resolution, LVEF 45%.  At the time of her thrombus also was on estrogen + smoking, and recently had Moderna covid vaccine, with some suspicion that these 3 things may have played a role, after >61mo of a/c therapy warfarin was stopped. HTN, HLD, hypothyroidism, COPD, + smoker until a month ago, anxiety/depresison  Neurology notes:  left parietal and temporal lobe infarct embolic secondary to likely thromboembolic source in a patient with recent LV thrombus.  However present 2D echo shows no definite thrombus despite patient being off anticoagulation.  she has undergone workup for stroke including echocardiogram and carotid angio.  The patient has been monitored on telemetry which has demonstrated sinus rhythm with no arrhythmias.    05/10/21; TEE IMPRESSIONS   1. Lateral akinesis with overall mild LV dysfunction; negative saline  microcavitation study.   2. Left ventricular ejection fraction, by estimation, is 40 to 45%. The  left ventricle has mildly decreased function. The left ventricle  demonstrates regional wall motion abnormalities (see scoring  diagram/findings for description).   3. Right ventricular systolic function is normal. The right ventricular  size is normal.   4. No left atrial/left atrial appendage thrombus was detected.   5. The mitral valve is  normal in structure. Trivial mitral valve  regurgitation.   6. The aortic valve is tricuspid. Aortic valve regurgitation is not  visualized.   7. There is mild (Grade II) plaque involving the descending aorta.   8. Agitated saline contrast bubble study was negative, with no evidence  of any interatrial shunt.    06/08/21: TTE (limited) IMPRESSIONS   1. There is no left ventricular thrombus with Definity contrast. The left  ventricle has mildly decreased function. There is moderate hypokinesis of  the left ventricular, basal-mid inferior wall, inferoseptal wall and  inferolateral wall. Findings suggest   old infarction in the left circumflex coronary distribution.   Comparison(s): A prior study was performed on 05/19/2021 and 05/16/2020.  Prior images reviewed side by side. The left ventricular function is  unchanged. The left ventricular wall motion abnormality is unchanged. The  current study was limited to LV wall  motion and systolic function assessment. The LV thrombus described in 2021  is no longer present.    05/19/21: TTE IMPRESSIONS   1. Left ventricular ejection fraction, by estimation, is 40 to 45%. The  left ventricle has mildly decreased function. The left ventricle  demonstrates global hypokinesis. Left ventricular diastolic parameters are  consistent with Grade I diastolic  dysfunction (impaired relaxation).   2. Right ventricular systolic function was not well visualized. The right  ventricular size is normal. Tricuspid regurgitation signal is inadequate  for assessing PA pressure.   3. The mitral valve is normal in structure. No evidence of mitral valve  regurgitation. No evidence of mitral stenosis.   4. The aortic valve was not well visualized. Aortic valve regurgitation  is not visualized. No aortic stenosis  is present.   5. The inferior vena cava is normal in size with greater than 50%  respiratory variability, suggesting right atrial pressure of 3 mmHg.   . 06/09/21: LE venous US Summary:  BILATERAL:  - No evidence of deep vein thrombosis seen in the lower extremities,  bilaterally.  -No evidence of popliteal cyst, bilaterally.   Lab work is reviewed.    Prior to admission, the patient denies chest pain, shortness of breath, dizziness, palpitations, or syncope.  They are recovering from their stroke with plans to home at discharge. The patient and her husband state she is near baseline with only occasional word finding hesitation/mistake.      Past Medical History:  Diagnosis Date   Anxiety    Arthritis    Chronic diastolic CHF (congestive heart failure), NYHA class 2 (HCC)    COPD (chronic obstructive pulmonary disease) (HCC)    Depression    HTN (hypertension)    Hypercholesterolemia    Hypothyroidism (acquired)    Kidney stones 2015   Myocardial infarction [facility])    x3- pt unsure if MIs were in 1996 or 1999, had stent fixed in 2016 but no MI at that time per pt   Thyrotoxicosis    TIA (transient ischemic attack)    2013     Surgical History:  Past Surgical History:  Procedure Laterality Date   BREAST CYST EXCISION     CARDIAC CATHETERIZATION N/A 08/24/2015   Procedure: Left Heart Cath and Coronary Angiography;  Surgeon: Jettie Booze, MD;  Location: [facility];  Service: Cardiovascular;  Laterality: N/A;   COLONOSCOPY     CORONARY STENT PLACEMENT  1999   and then had procedure to repair in 2016   KNEE SURGERY     partial hysterec     THYROIDECTOMY       Medications Prior to Admission  Medication Sig Dispense Refill Last Dose   albuterol (PROVENTIL HFA;VENTOLIN HFA) 108 (90 Base) MCG/ACT inhaler Inhale 2 puffs into the lungs in the morning and at bedtime.   Past Week   ALPRAZolam (XANAX) 1 MG tablet Take 1 mg by mouth 3 (three) times daily as needed for anxiety.   Past Week   buPROPion (WELLBUTRIN XL) 300 MG 24 hr tablet Take 300 mg by mouth daily.   Past Week   cyclobenzaprine (FLEXERIL) 10 MG  tablet Take 1 tablet (10 mg total) by mouth 3 (three) times daily as needed for muscle spasms. 21 tablet 0 unk   furosemide (LASIX) 40 MG tablet Take 40 mg by mouth daily.   Past Week   gabapentin (NEURONTIN) 300 MG capsule Take 300 mg by mouth daily as needed (For pain).   UNK   ibuprofen (ADVIL) 200 MG tablet Take 400 mg by mouth every 6 (six) hours as needed for mild pain.   Past Week   ipratropium-albuterol (DUONEB) 0.5-2.5 (3) MG/3ML SOLN Take 3 mLs by nebulization every 6 (six) hours as needed. (Patient taking differently: Take 3 mLs by nebulization every 6 (six) hours as needed (shortness of breath, wheezing).) 360 mL 1 Past Week   isosorbide mononitrate (IMDUR) 30 MG 24 hr tablet Take 2 tablets (60 mg total) by mouth daily. 30 tablet 5 Past Week   levothyroxine (SYNTHROID) 112 MCG tablet TAKE 1 TABLET BY MOUTH EVERY MORNING 90 tablet 3 Past Week   LINZESS 145 MCG CAPS capsule Take 145 mcg by mouth daily.   Past Week   MELATONIN PO Take 1 tablet  by mouth at bedtime as needed (sleep).   Past Week   Methylsulfonylmethane (MSM PO) Take 1 tablet by mouth daily.   Past Week   metoprolol succinate (TOPROL-XL) 25 MG 24 hr tablet Take 0.5 tablets (12.5 mg total) by mouth daily. 30 tablet 0 Past Week   nitroGLYCERIN (NITROSTAT) 0.3 MG SL tablet Place 0.3 mg under the tongue every 5 (five) minutes as needed for chest pain.  0 unk   triamcinolone cream (KENALOG) 0.5 % Apply 1 application topically daily as needed (for sun exposure).   Past Month   [DISCONTINUED] aspirin EC 81 MG tablet Take 1 tablet (81 mg total) by mouth daily. Swallow whole. 90 tablet 3 Past Week   predniSONE (STERAPRED UNI-PAK 21 TAB) 10 MG (21) TBPK tablet Use per package instructions (Patient not taking: No sig reported) 21 tablet 0 Completed Course   [DISCONTINUED] rosuvastatin (CRESTOR) 20 MG tablet Take 1 tablet (20 mg total) by mouth daily. (Patient not taking: No sig reported) 90 tablet 3 Not Taking    Inpatient Medications:    aspirin  325 mg Oral Daily   enoxaparin (LOVENOX) injection  40 mg Subcutaneous Q24H   levothyroxine  112 mcg Oral q morning   linaclotide  145 mcg Oral Daily   rosuvastatin  20 mg Oral Daily    Allergies:  Allergies  Allergen Reactions   Hydrocodone Nausea Only    Has problems with high doses    Social History   Socioeconomic History   Marital status: Legally Separated    Spouse name: Not on file   Number of children: 2   Years of education: Not on file   Highest education level: Not on file  Occupational History   Occupation: [employer]  Tobacco Use   Smoking status: Light Smoker    Packs/day: 0.50    Types: Cigarettes   Smokeless tobacco: Never  Vaping Use   Vaping Use: Never used  Substance and Sexual Activity   Alcohol use: No    Alcohol/week: 0.0 standard drinks   Drug use: No   Sexual activity: Not on file  Other Topics Concern   Not on file  Social History Narrative   Not on file   Social Determinants of Health   Financial Resource Strain: Not on file  Food Insecurity: Not on file  Transportation Needs: Not on file  Physical Activity: Not on file  Stress: Not on file  Social Connections: Not on file  Intimate Partner Violence: Not on file     Family History  Problem Relation Age of Onset   Heart disease Mother    Stroke Mother    Hypertension Mother    Heart disease Father    Cancer Father    Berenice Primas' disease Father    Colon cancer Father        pt unsure of age onset, believes he was in his 58s   Gout Brother    Gout Brother    Cancer Brother    Stroke Brother    Diabetes Maternal Grandmother    Hypertension Maternal Grandmother    Heart attack Neg Hx    Esophageal cancer Neg Hx    Stomach cancer Neg Hx    Rectal cancer Neg Hx       Review of Systems: All other systems reviewed and are otherwise negative except as noted above.  Physical Exam: Vitals:   06/10/21 1305 06/10/21 1314 06/10/21 1324 06/10/21 1333  BP: 115/61 119/73 (!)  110/92 100/66  Pulse: 73 87 85 82  Resp: (!) 26 (!) 28 20 20   Temp: (!) 97.5 F (36.4 C)     TempSrc: Oral     SpO2: 96% 96% 96% 96%  Weight:      Height:        GEN- The patient is well appearing, alert and oriented x 3 today.   Head- normocephalic, atraumatic Eyes-  Sclera clear, conjunctiva pink Ears- hearing intact Oropharynx- clear Neck- supple Lungs- CTA b/l, normal work of breathing Heart- RRR, no murmurs, rubs or gallops  GI- soft, NT, ND Extremities- no clubbing, cyanosis, or edema MS- no significant deformity or atrophy Skin- no rash or lesion Psych- euthymic mood, full affect   Labs:   Lab Results  Component Value Date   WBC 5.7 06/08/2021   HGB 12.6 06/08/2021   HCT 37.0 06/08/2021   MCV 88.6 06/08/2021   PLT 267 06/08/2021    Recent Labs  Lab 06/08/21 0925 06/08/21 0937 06/09/21 0355  NA 136   < > 137  K 4.2   < > 4.2  CL 100   < > 102  CO2 25  --  25  BUN 18   < > 19  CREATININE 1.30*   < > 1.17*  CALCIUM 9.3  --  9.2  PROT 6.4*  --   --   BILITOT 0.9  --   --   ALKPHOS 61  --   --   ALT 30  --   --   AST 20  --   --   GLUCOSE 97   < > 101*   < > = values in this interval not displayed.   Lab Results  Component Value Date   TROPONINI <0.03 11/04/2017   Lab Results  Component Value Date   CHOL 178 06/09/2021   CHOL 286 (H) 10/29/2020   CHOL 218 (H) 09/07/2020   Lab Results  Component Value Date   HDL 46 06/09/2021   HDL 55 10/29/2020   HDL 63 09/07/2020   Lab Results  Component Value Date   LDLCALC 112 (H) 06/09/2021   LDLCALC 207 (H) 10/29/2020   LDLCALC 132 (H) 09/07/2020   Lab Results  Component Value Date   TRIG 98 06/09/2021   TRIG 134 10/29/2020   TRIG 133 09/07/2020   Lab Results  Component Value Date   CHOLHDL 3.9 06/09/2021   CHOLHDL 5.2 (H) 10/29/2020   CHOLHDL 3.5 09/07/2020   No results found for: LDLDIRECT  Lab Results  Component Value Date   DDIMER 0.65 (H) 05/16/2021     Radiology/Studies:  CT  HEAD WO CONTRAST Result Date: 06/08/2021 CLINICAL DATA:  Neuro deficit, acute, stroke suspected EXAM: CT HEAD WITHOUT CONTRAST TECHNIQUE: Contiguous axial images were obtained from the base of the skull through the vertex without intravenous contrast. COMPARISON:  08/20/2016. FINDINGS: Brain: No evidence of acute infarction, hemorrhage, hydrocephalus, extra-axial collection or mass lesion/mass effect. Vascular: No hyperdense vessel identified. Calcific intracranial atherosclerosis. Skull: No evidence of acute fracture. Sinuses/Orbits: Mild paranasal sinus mucosal thickening. Unremarkable orbits. Other: No mastoid effusions. IMPRESSION: No evidence of acute intracranial abnormality. Electronically Signed   By: Margaretha Sheffield M.D.   On: 06/08/2021 10:11   CT Angio Chest Pulmonary Embolism (PE) W or WO Contrast Result Date: 05/16/2021 CLINICAL DATA:  Shortness of breath and positive D-dimer EXAM: CT ANGIOGRAPHY CHEST WITH CONTRAST TECHNIQUE: Multidetector CT imaging of the chest was performed using the standard protocol during bolus administration of intravenous  contrast. Multiplanar CT image reconstructions and MIPs were obtained to evaluate the vascular anatomy. CONTRAST:  56mL OMNIPAQUE IOHEXOL 350 MG/ML SOLN COMPARISON:  Chest x-ray from the previous day. FINDINGS: Cardiovascular: Thoracic aorta and its branches demonstrate atherosclerotic calcification without aneurysmal dilatation or dissection. Pulmonary artery shows a normal branching pattern. No filling defect to suggest pulmonary embolism is noted. Mild coronary calcifications are seen. No cardiac enlargement is noted. Mediastinum/Nodes: Thoracic inlet is within normal limits. The esophagus as visualized is within normal limits. No sizable hilar or mediastinal adenopathy is noted. Lungs/Pleura: Lungs are well aerated bilaterally. Mild emphysematous changes are seen. Patchy ground-glass airspace opacities are noted bilaterally predominately within the  upper lobes consistent with postinflammatory change. No sizable effusion is noted. No sizable parenchymal nodules are seen. Upper Abdomen: Visualized upper abdomen shows no acute abnormality. Musculoskeletal: Degenerative changes of the thoracic spine are noted. No acute rib abnormality is noted. Review of the MIP images confirms the above findings. IMPRESSION: No evidence of pulmonary emboli. Patchy ground-glass opacities consistent with a postinflammatory nature predominately within the upper lobes. No other focal abnormality is seen. Electronically Signed   By: Inez Catalina M.D.   On: 05/16/2021 23:23   MR BRAIN WO CONTRAST Result Date: 06/08/2021 CLINICAL DATA:  Neuro deficit, acute, stroke suspected EXAM: MRI HEAD WITHOUT CONTRAST TECHNIQUE: Multiplanar, multiecho pulse sequences of the brain and surrounding structures were obtained without intravenous contrast. COMPARISON:  Same day CT/CTA. FINDINGS: Brain: Small acute cortical infarcts in the left parietal and superior temporal lobe, likely distal left MCA territory. Faint edema without mass effect. No midline shift. Basal cisterns are patent. Otherwise, mild scattered T2 hyperintensities in the white matter, nonspecific but compatible with chronic microvascular ischemic disease. No hydrocephalus, mass lesion, extra-axial fluid collection, or acute hemorrhage. Vascular: Better evaluated on concurrent CTA. Skull and upper cervical spine: Heterogeneous marrow without focal suspicious lesion. Sinuses/Orbits: Minimal paranasal sinus mucosal thickening. Unremarkable orbits. Other: No sizable mastoid effusions. IMPRESSION: 1. Small acute cortical infarcts in the left parietal and superior temporal lobe, likely distal left MCA territory. Faint edema without mass effect. 2. Mild chronic microvascular disease. Electronically Signed   By: Margaretha Sheffield M.D.   On: 06/08/2021 12:20   DG CHEST PORT 1 VIEW Result Date: 05/18/2021 CLINICAL DATA:  Respiratory  distress EXAM: PORTABLE CHEST 1 VIEW COMPARISON:  05/16/2021 CT FINDINGS: Cardiac shadow is within normal limits. The lungs are well aerated bilaterally. The changes seen on recent CT are not well appreciated on plain film examination. No sizable effusion is seen. No bony abnormality is noted. IMPRESSION: No acute abnormality noted. The findings on recent chest CT are not well appreciated on this study. Electronically Signed   By: Inez Catalina M.D.   On: 05/18/2021 00:17      VAS [place] TRANSCRANIAL DOPPLER W BUBBLES Result Date: 06/09/2021  Transcranial Doppler with Bubble Patient Name:  ANERI SLAGEL Donlon  Date of Exam:   06/09/2021 Medical Rec #: [id number]       Accession #:    [id number] Date of Birth: [birth year]      Patient Gender: F Patient Age:   55 years Exam Location:  [facility] Procedure:      VAS [place] TRANSCRANIAL DOPPLER W BUBBLES Referring Phys: Lelan Pons FRANCOIS --------------------------------------------------------------------------------  Indications: Stroke. Comparison Study: no prior Performing Technologist: Archie Patten RVS  Examination Guidelines: A complete evaluation includes B-mode imaging, spectral Doppler, color Doppler, and power Doppler as needed of all accessible portions of each vessel. Bilateral  testing is considered an integral part of a complete examination. Limited examinations for reoccurring indications may be performed as noted.  Summary:  A vascular evaluation was performed. The right middle cerebral artery was studied. An IV was inserted into the patient's right AC . Verbal informed consent was obtained.  HITS heard at rest and during valsalva: Spencer grade 3 *See table(s) above for TCD measurements and observations.    Preliminary       VAS [place] LOWER EXTREMITY VENOUS (DVT) Result Date: 06/09/2021  Lower Venous DVT Study Patient Name:  KONNER WARRIOR Bakula  Date of Exam:   06/09/2021 Medical Rec #: [id number]       Accession #:    [id number] Date of Birth: [birth year]       Patient Gender: F Patient Age:   55 years Exam Location:  [facility] Procedure:      VAS [place] LOWER EXTREMITY VENOUS (DVT) Referring Phys: PRAMOD SETHI --------------------------------------------------------------------------------  Indications: Stroke.  Comparison Study: no prior Performing Technologist: Archie Patten RVS  Examination Guidelines: A complete evaluation includes B-mode imaging, spectral Doppler, color Doppler, and power Doppler as needed of all accessible portions of each vessel. Bilateral testing is considered an integral part of a complete examination. Limited examinations for reoccurring indications may be performed as noted. The reflux portion of the exam is performed with the patient in reverse Trendelenburg.    Summary: BILATERAL: - No evidence of deep vein thrombosis seen in the lower extremities, bilaterally. -No evidence of popliteal cyst, bilaterally.   *See table(s) above for measurements and observations.    Preliminary      CT ANGIO HEAD NECK W WO CM W PERF (CODE STROKE) Addendum Date: 06/08/2021   ADDENDUM REPORT: 06/08/2021 12:59 ADDENDUM: CTA neck impression #3 and CTA head impression #1 called by telephone at the time of interpretation on 06/08/2021 at 12:23 pm to provider Dr. Roslynn Amble, who verbally acknowledged these results. Electronically Signed   By: Kellie Simmering D.O.   On: 06/08/2021 12:59   Result Date: 06/08/2021 CLINICAL DATA:  Neuro deficit, acute, stroke suspected. Additional history provided: Aphasia. EXAM: CT ANGIOGRAPHY HEAD AND NECK CT PERFUSION BRAIN TECHNIQUE: Multidetector CT imaging of the head and neck was performed using the standard protocol during bolus administration of intravenous contrast. Multiplanar CT image reconstructions and MIPs were obtained to evaluate the vascular anatomy. Carotid stenosis measurements (when applicable) are obtained utilizing NASCET criteria, using the distal internal carotid diameter as the denominator. Multiphase CT  imaging of the brain was performed following IV bolus contrast injection. Subsequent parametric perfusion maps were calculated using RAPID software. CONTRAST:  160mL OMNIPAQUE IOHEXOL 350 MG/ML SOLN COMPARISON:  Noncontrast head CT performed earlier today 06/08/2021. FINDINGS: CTA NECK FINDINGS Aortic arch: Standard aortic branching. Soft and calcified plaque within the visualized aortic arch and proximal major branch vessels of the neck. Probable non-acute penetrating atherosclerotic ulceration and/or chronic focal dissection of the lateral arch (for instance as seen on series 7, images 304 and 305) (series 10, image 168). The arch is nonaneurysmal at this level, measuring 2.5 cm in diameter. No hemodynamically significant innominate or proximal subclavian artery stenosis. Right carotid system: CCA and ICA patent within the neck without significant stenosis (50% or greater). Mild atherosclerotic plaque within the distal CCA, carotid bifurcation and proximal ICA. Left carotid system: CCA and ICA patent within the neck without significant stenosis (50% or greater). Mild to moderate predominantly calcified plaque within the distal CCA, carotid bifurcation and proximal ICA. Vertebral arteries:  Streak and beam hardening artifact arising from a dense right-sided contrast bolus partially obscures the V1 right vertebral artery. Within this limitation, the vertebral arteries are patent within the neck without appreciable stenosis. Minimal nonstenotic calcified plaque within the distal cervical left vertebral artery. Skeleton: Cervical spondylosis. No acute bony abnormality or aggressive osseous lesion partially imaged thoracic levocurvature. Other neck: 11 mm right thyroid lobe nodule. The left thyroid lobe is poorly delineated, markedly atrophic or surgically absent. Upper chest: The centrilobular and paraseptal emphysema. No consolidation within the imaged lung apices. Review of the MIP images confirms the above findings  CTA HEAD FINDINGS Anterior circulation: The intracranial internal carotid arteries are patent. Mild calcified plaque within both vessels without stenosis. The M1 middle cerebral arteries are patent. No M2 proximal branch occlusion or high-grade proximal stenosis is identified. The anterior cerebral arteries are patent. No intracranial aneurysm is identified. Posterior circulation: The intracranial vertebral arteries are patent. The basilar artery is patent. The posterior cerebral arteries are patent. The posterior cerebral arteries are fetal in origin bilaterally. Venous sinuses: Within the limitations of contrast timing, no convincing thrombus. Anatomic variants: As described. Review of the MIP images confirms the above findings CT Brain Perfusion Findings: CBF (<30%) Volume: 63mL Perfusion (Tmax>6.0s) volume: 9mL (However, the perfusion software identifies a 13 mL region of hypoperfusion within the posterior left MCA territory utilizing the Tmax >4.0 seconds threshold). Mismatch Volume: 64mL Infarction Location:None identified IMPRESSION: CTA neck: 1. The common carotid and internal carotid arteries are patent within the neck without hemodynamically significant stenosis (50% or greater). Atherosclerotic plaque within the bilateral carotid systems within the neck, as described. 2. Streak and beam hardening artifact arising from a dense right-sided contrast bolus partially obscures the V1 right vertebral artery. Within this limitation, the vertebral arteries are patent within the neck without appreciable stenosis. 3. Likely non-acute penetrating atherosclerotic ulceration and/or chronic focal dissection within the lateral aortic arch. The aortic arch is nonaneurysmal at this level. 4. Aortic Atherosclerosis (ICD10-I70.0) and Emphysema (ICD10-J43.9). CTA head: 1. No intracranial large vessel occlusion or proximal high-grade arterial stenosis is identified. 2. Calcified plaque within the intracranial internal carotid  arteries with no more than mild stenosis. CT perfusion head: The perfusion software identifies no core infarct. The perfusion software identifies no hypoperfused parenchyma utilizing the Tmax>6 seconds threshold. However, the perfusion software identifies a 13 mL region of hypoperfusion within the posterior left MCA territory utilizing the Tmax >4.0 seconds threshold. Electronically Signed: By: Kellie Simmering D.O. On: 06/08/2021 12:23    12-lead ECG SR All prior EKG's in EPIC reviewed with no documented atrial fibrillation  Telemetry SR  Assessment and Plan:  1. Cryptogenic stroke The patient presents with cryptogenic stroke.  The patient had a TEE today.  Post TEE  The patient is AAO x4 She is able to tell me her name, the date, where she is and why.  Her husband is at bedside and both mention Dr. Clydene Fake discussion the other day about loop implant recommendation and rational for it I spoke at length with the patient and her husband bedside about monitoring for afib with either a 30 day event monitor or an implantable loop recorder.  We discuss rational for monitoring for AFib in environment of a stroke.  We  Risks, benefits, and alteratives to implantable loop recorder were discussed with the patient today.   At this time, the patient and her husband are very clear in their decision to proceed with implantable loop recorder and proceeding today  prior to discharge.   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  Wound check follow up will be scheduled for the patient.  Please call with questions.   Renee Dyane Dustman, PA-C 06/10/2021   I have seen, examined the patient, and reviewed the above assessment and plan.  Changes to above are made where necessary.   On exam, RRR.  The patient presents with cryptogenic stroke.  No causes have been found.   I spoke at length with the patient about monitoring for afib with either a 30 day event monitor or an implantable loop recorder.  Risks, benefits, and alteratives to implantable loop recorder were discussed with the patient today.   At this time, the patient is very clear in their decision to proceed with implantable loop recorder.   Please call with questions.   Co Sign: Thompson Grayer, MD 06/10/2021 5:03 PM

## 2021-06-11 ENCOUNTER — Encounter (HOSPITAL_COMMUNITY): Payer: Self-pay | Admitting: Internal Medicine

## 2021-06-11 NOTE — Anesthesia Postprocedure Evaluation (Signed)
Anesthesia Post Note  Patient: Jocelyn [name]  Procedure(s) Performed: TRANSESOPHAGEAL ECHOCARDIOGRAM (TEE) BUBBLE STUDY     Patient location during evaluation: PACU Anesthesia Type: MAC Level of consciousness: awake and alert Pain management: pain level controlled Vital Signs Assessment: post-procedure vital signs reviewed and stable Respiratory status: spontaneous breathing, nonlabored ventilation, respiratory function stable and patient connected to nasal cannula oxygen Cardiovascular status: stable and blood pressure returned to baseline Postop Assessment: no apparent nausea or vomiting Anesthetic complications: no   No notable events documented.  Last Vitals:  Vitals:   06/10/21 1333 06/10/21 1557  BP: 100/66 102/66  Pulse: 82 91  Resp: 20 18  Temp:  37.2 C  SpO2: 96% 95%    Last Pain:  Vitals:   06/10/21 1600  TempSrc:   PainSc: 0-No pain                 Tiajuana Amass

## 2021-06-14 ENCOUNTER — Other Ambulatory Visit: Payer: Self-pay | Admitting: *Deleted

## 2021-06-14 ENCOUNTER — Encounter: Payer: Self-pay | Admitting: Speech Pathology

## 2021-06-14 ENCOUNTER — Ambulatory Visit: Payer: Medicare Other | Admitting: Speech Pathology

## 2021-06-14 ENCOUNTER — Other Ambulatory Visit: Payer: Self-pay

## 2021-06-14 ENCOUNTER — Ambulatory Visit: Payer: Medicare Other | Attending: Internal Medicine | Admitting: Speech Pathology

## 2021-06-14 DIAGNOSIS — R41841 Cognitive communication deficit: Secondary | ICD-10-CM | POA: Insufficient documentation

## 2021-06-14 DIAGNOSIS — R4701 Aphasia: Secondary | ICD-10-CM | POA: Diagnosis not present

## 2021-06-14 DIAGNOSIS — L0292 Furuncle, unspecified: Secondary | ICD-10-CM | POA: Diagnosis not present

## 2021-06-14 DIAGNOSIS — I872 Venous insufficiency (chronic) (peripheral): Secondary | ICD-10-CM | POA: Diagnosis not present

## 2021-06-14 DIAGNOSIS — E782 Mixed hyperlipidemia: Secondary | ICD-10-CM | POA: Diagnosis not present

## 2021-06-14 DIAGNOSIS — I2510 Atherosclerotic heart disease of native coronary artery without angina pectoris: Secondary | ICD-10-CM | POA: Diagnosis not present

## 2021-06-14 DIAGNOSIS — I5032 Chronic diastolic (congestive) heart failure: Secondary | ICD-10-CM | POA: Diagnosis not present

## 2021-06-14 DIAGNOSIS — J441 Chronic obstructive pulmonary disease with (acute) exacerbation: Secondary | ICD-10-CM | POA: Diagnosis not present

## 2021-06-14 DIAGNOSIS — I6389 Other cerebral infarction: Secondary | ICD-10-CM | POA: Diagnosis not present

## 2021-06-14 DIAGNOSIS — E039 Hypothyroidism, unspecified: Secondary | ICD-10-CM | POA: Diagnosis not present

## 2021-06-14 DIAGNOSIS — E1165 Type 2 diabetes mellitus with hyperglycemia: Secondary | ICD-10-CM | POA: Diagnosis not present

## 2021-06-14 DIAGNOSIS — I10 Essential (primary) hypertension: Secondary | ICD-10-CM | POA: Diagnosis not present

## 2021-06-14 DIAGNOSIS — M62838 Other muscle spasm: Secondary | ICD-10-CM | POA: Diagnosis not present

## 2021-06-14 NOTE — Patient Outreach (Signed)
[facility]) Care Management  06/14/2021  Jocelyn [name] [birth year] [id number]   RED ON EMMI ALERT - Stroke Day # 1 Date: 11/13 Red Alert Reason: Questions about medications and Does not know how/when to take medications   Outreach attempt #1, unsuccessful, unable to leave message as mailbox is full.     Plan: RN CM will send outreach letter and follow up within the next 3-4 business days.  [name], [place], MSN [place] [phone]

## 2021-06-15 LAB — CARDIOLIPIN ANTIBODIES, IGG, IGM, IGA
Anticardiolipin IgA: <9 APL U/mL (ref 0–11)
Anticardiolipin IgG: <9 GPL U/mL (ref 0–14)
Anticardiolipin IgM: 24 MPL U/mL — ABNORMAL HIGH (ref 0–12)

## 2021-06-15 NOTE — Therapy (Signed)
[facility]. [place], [place], [address] Phone: [phone]   Fax:  [phone]  Speech Language Pathology Evaluation  Patient Details  Name: Jocelyn [name] MRN: [id number] Date of Birth: Mar 05, 1961 Referring Provider (SLP): [name] MD   Encounter Date: 06/14/2021   End of Session - 06/14/21 1550     Visit Number 1    Number of Visits 17    Date for SLP Re-Evaluation 09/14/21    SLP Start Time 8921    SLP Stop Time  1941    SLP Time Calculation (min) 45 min    Activity Tolerance Patient tolerated treatment well             Past Medical History:  Diagnosis Date   Anxiety    Arthritis    Chronic diastolic CHF (congestive heart failure), NYHA class 2 (HCC)    COPD (chronic obstructive pulmonary disease) ([place])    Depression    HTN (hypertension)    Hypercholesterolemia    Hypothyroidism (acquired)    Kidney stones 2015   Myocardial infarction ([place])    x3- pt unsure if MIs were in 1996 or 1999, had stent fixed in 2016 but no MI at that time per pt   Thyrotoxicosis    TIA (transient ischemic attack)    2013    Past Surgical History:  Procedure Laterality Date   BREAST CYST EXCISION     BUBBLE STUDY  06/10/2021   Procedure: BUBBLE STUDY;  Surgeon: Lelon Perla, MD;  Location: [facility];  Service: Cardiovascular;;   CARDIAC CATHETERIZATION N/A 08/24/2015   Procedure: Left Heart Cath and Coronary Angiography;  Surgeon: Jettie Booze, MD;  Location: [facility];  Service: Cardiovascular;  Laterality: N/A;   COLONOSCOPY     CORONARY [place]   and then had procedure to repair in 2016   [place] N/A 06/10/2021   Procedure: LOOP RECORDER INSERTION;  Surgeon: Thompson Grayer, MD;  Location: [facility];  Service: Cardiovascular;  Laterality: N/A;   partial hysterec     TEE WITHOUT CARDIOVERSION N/A 06/10/2021   Procedure:  TRANSESOPHAGEAL ECHOCARDIOGRAM (TEE);  Surgeon: Lelon Perla, MD;  Location: [facility];  Service: Cardiovascular;  Laterality: N/A;   THYROIDECTOMY      There were no vitals filed for this visit.   Subjective Assessment - 06/14/21 1443     Subjective Pt was pleasant and cooperative throughout evaluation.    Patient is accompained by: Family member   Daughter, [name]   Pain Score 7     Pain Location Hip    Pain Orientation Left    Pain Descriptors / Indicators Aching;Sharp    Pain Type Acute pain    Pain Frequency Intermittent                SLP Evaluation [facility] - 06/14/21 1443       SLP Visit Information   SLP Received On 06/14/21    Referring Provider (SLP) [name] MD    Onset Date 06/08/21    Medical Diagnosis CVA      Subjective   Patient/Family Stated Goal To return speech to normal      General Information   HPI Jocelyn [name] is a 60 y.o. 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  CT angiogram no stenosis in common carotic or ICA, streak and being hardening right-sided vertebral artery.  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      Balance Screen   Has the patient fallen in the past 6 months No    Has the patient had a decrease in activity level because of a fear of falling?  No    Is the patient reluctant to leave their home because of a fear of falling?  No      Prior Functional Status    Cognitive/Linguistic Baseline Within functional limits    Type of Home Apartment     Lives With Daughter    Available Support Family    Education GED, college courses    Vocation On disability      Cognition   Overall Cognitive Status Difficult to assess   To assess at a later date     Auditory Comprehension   Overall Auditory Comprehension Impaired    Yes/No Questions Within Functional Limits    One Step Basic Commands 75-100% accurate    Two Step Basic Commands 75-100% accurate    Multistep Basic Commands 75-100% accurate   Required extra time   Conversation Complex    Other Conversation Comments Reports difficulty when given too much information.      Reading Comprehension   Reading Status Within funtional limits      Expression   Primary Mode of Expression Verbal      Verbal Expression   Overall Verbal Expression Impaired    Level of Generative/Spontaneous Verbalization Conversation    Repetition No impairment    Naming Impairment    Confrontation 75-100% accurate    Verbal Errors Semantic paraphasias;Phonemic paraphasias;Aware of errors;Inconsistent    Pragmatics No impairment    Effective Techniques Semantic cues;Phonemic cues;Articulatory cues      Oral Motor/Sensory Function   Overall Oral Motor/Sensory Function Appears within functional limits for tasks assessed      Motor Speech   Overall Motor Speech Appears within functional limits for tasks assessed      Standardized Assessments   Standardized Assessments  Other Assessment    Other Assessment WAB-B; [place] Naming Test (short form)             Western Aphasia Battery - Bedside Record Form   Spontaneous Speech - Content: 6/10 Spontaneous Speech- Fluency: 8/10 Auditory Verbal Comprehension- Yes/No Questions: 10/10 Sequential Commands: 10/10  Repetition: 9/10 Object naming: 10/10 Reading: 8/10 Writing: 10/10 Apraxia (Optional): NA                 SLP Education - 06/14/21 1456      Education Details Aphasia    Person(s) Educated Patient;Child(ren)    Methods Explanation;Demonstration;Handout    Comprehension Verbalized understanding;Need further instruction              SLP Short Term Goals - 06/14/21 1612       SLP SHORT TERM GOAL #1   Title Pt will participate in SLUMS screener in order to screen cognition.    Time 4    Period Weeks    Status  New    Target Date 07/12/21      SLP SHORT TERM GOAL #2   Title Pt will recall 3 word finding strategies to use in cases of anomia with minA.    Time 4    Period Weeks    Status New    Target Date 07/12/21      SLP SHORT TERM GOAL #3   Title Pt daughter/family will recall 3 communication strategies in order to decrease communication breakdowns with minA.    Time 4    Period Weeks    Status New    Target Date 07/12/21              SLP Long Term Goals - 06/14/21 1705       SLP LONG TERM GOAL #1   Title Pt and  daughter/family will report successful use of word finding strategies to decrease anomia in conversation.    Time 8    Period Weeks    Status New    Target Date 08/09/21      SLP LONG TERM GOAL #2   Title Pt and daughter/family will demonstrate use of communication strategies to reduce frustration and communication breakdowns during conversation.    Time 8    Period Weeks    Status New    Target Date 08/09/21      SLP LONG TERM GOAL #3   Title --    Time --    Period --    Status --    Target Date --              Plan - 06/14/21 1551     Clinical Impression Statement Pt is a 60 y.o. 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 Relevant pt hx includes previous CVA and COPD. When asked for reason of visit, pt stated "they told me to come here"; however, pt did endorse difficulty with word finding and frustration in conversation. Pt endorses mild difficulty with comprehending spoken language when given a considerable amount of information.  Pt daughter was present for session and reports pt "says he for she and cannot say big words (multisyllabic)". Pt daughter also states pt often "does not make sense in conversation" causing communication breakdowns. Pt currently is managing own medication; however, feels confused on what medications to take due to being prescribed new medications. SLP screened pt's expressive and receptive language using the WAB-Bedside. See eval for scores. SLP made note that pt presented with phonemic paraphasias throughout evaluation and hesitated while writing sentences. SLP also assessed pt expressive language using [place] (BNT) where pt obtained a score of 8/15. Seven objects required phonemic cues in order to achieve accuracy. SLP noted pt exhibited most difficulty with pronouncing multisyllabic words due to the phonemic paraphasias. SLP rec skilled speech services to address word finding difficulties. Pt, pt's daughter, and SLP are in agreement that pt would benefit from speech services in order to decrease communication breakdowns and increase participation in conversation. SLP to screen pt's cognition next visit.    Speech Therapy Frequency 2x / week    Duration 8 weeks    Treatment/Interventions Compensatory strategies;Cueing hierarchy;Functional tasks;Patient/family education;Environmental controls;Cognitive reorganization;Multimodal communcation approach;Language facilitation;Compensatory techniques;Internal/external aids;SLP instruction and feedback    Potential to Achieve Goals Good    Potential Considerations Ability to learn/carryover information    Consulted and Agree with Plan of Care Patient;Family member/caregiver    Family Member Consulted Daughter - [name]  Patient will benefit from skilled therapeutic intervention in order to improve the following deficits and impairments:   Aphasia    Problem List Patient Active Problem List   Diagnosis Date Noted   Stroke  (cerebrum) ([place]) 06/08/2021   Cerebral infarction ([place]) 06/08/2021   COPD exacerbation ([place]) 05/16/2021   Acute on chronic respiratory failure with hypoxia ([place]) 05/16/2021   Hypokalemia 05/16/2021   AKI (acute kidney injury) ([place]) 05/16/2021   LV (left ventricular) mural thrombus 05/25/2020   Renal infarct ([place]) 05/16/2020   Hypoxia 05/16/2020   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   Proptosis 12/27/2019   Multinodular goiter 03/27/2019   Hypothyroidism 09/26/2018   Sacroiliitis, not elsewhere classified ([place]) 04/24/2017   Chronic right-sided low back pain with right-sided sciatica 01/12/2017   Chronic bilateral thoracic back pain 01/12/2017   Other spondylosis with radiculopathy, lumbar region 10/19/2016   Depression with anxiety 07/16/2016   Chronic diastolic CHF (congestive heart failure) ([place]) 07/16/2016   Aortic atherosclerosis ([place]) 11/24/2015   Unstable angina ([place]) 08/21/2015   Chest pain    Coronary artery disease    CAD (coronary artery disease), native coronary artery 01/14/2015   Hypertension 01/14/2015   Hypercholesterolemia 01/14/2015   Tobacco abuse 01/14/2015    Rosann Auerbach [place] MS, [place], CBIS  06/15/2021, 5:06 PM  [facility]. [place], [place], [address] Phone: [phone]   Fax:  [phone]  Name: ALANNY RIVERS MRN: [id number] Date of Birth: Mar 01, 1961

## 2021-06-16 ENCOUNTER — Other Ambulatory Visit: Payer: Self-pay | Admitting: *Deleted

## 2021-06-16 NOTE — Patient Outreach (Signed)
[facility]) Care Management  06/16/2021  Chena MECHILLE VARGHESE 1961-07-18 [id number]   RED ON EMMI ALERT Day #  Date:  Red Alert Reason:   Outreach attempt #2, successful. Identity verified.  This care manager introduced self and stated purpose of call.  [facility] care management services explained.    She report she is doing much better.  Was in the hospital initially for COPD and discharged back home, readmitted a couple weeks later with stroke.  State stroke only affected her speech, but that is better now.  Does not have PT/OT, but started speech therapy on Monday, several more appointments scheduled in the upcoming weeks.  She also had follow up appointment with PCP the same day.  Neurology has contacted her, earliest visit provided was 1/26.  Advised that she is scheduled for wound check at cardiology office on 11/23 at 830 am, she was not aware and will need the time changed.  Conference call placed to office, unsuccessful, member's phone number left for return call.  Dr. Irish Lack is her assigned cardiologist, will call his office to schedule follow up.  Daughter helping with transportation as member is not able to drive currently.    Medications reviewed with member, state she is now clear on how to take as they were also reviewed by PCP on Monday.  Discussed the use and effects of Plavix, verbalizes understanding.  She is a smoker, has not had a cigarette since discharge on 10/21, does have cravings.  She will continue to work to stay smoke free.  She denies needing ongoing follow up, denies needs.  Benefits of THN explained, encouraged to consider.  She agrees to consider, agrees to another follow up after cardiology visit.  Plan: RN CM will send stroke and smoking cessation education and follow up within the next 2 weeks.  Will offer [facility] engagement again at that time, if she declines will close case.  [name], [place], MSN [place] [phone]

## 2021-06-17 ENCOUNTER — Ambulatory Visit: Payer: Medicare Other | Admitting: Speech Pathology

## 2021-06-17 ENCOUNTER — Other Ambulatory Visit: Payer: Self-pay

## 2021-06-17 DIAGNOSIS — R4701 Aphasia: Secondary | ICD-10-CM

## 2021-06-17 NOTE — Therapy (Signed)
[place]. [place], [place], [address] Phone: [phone]   Fax:  [phone]  Speech Language Pathology Treatment  Patient Details  Name: Jocelyn [name] MRN: [id number] Date of Birth: 1960/08/14 Referring Provider (SLP): [name] MD   Encounter Date: 06/17/2021   End of Session - 06/17/21 1304     Visit Number 2    Number of Visits 17    Date for SLP Re-Evaluation 09/14/21    SLP Start Time 43    SLP Stop Time  6314    SLP Time Calculation (min) 45 min    Activity Tolerance Patient tolerated treatment well             Past Medical History:  Diagnosis Date   Anxiety    Arthritis    Chronic diastolic CHF (congestive heart failure), NYHA class 2 (HCC)    COPD (chronic obstructive pulmonary disease) ([place])    Depression    HTN (hypertension)    Hypercholesterolemia    Hypothyroidism (acquired)    Kidney stones 2015   Myocardial infarction ([employer])    x3- pt unsure if MIs were in 1996 or 1999, had stent fixed in 2016 but no MI at that time per pt   Thyrotoxicosis    TIA (transient ischemic attack)    2013    Past Surgical History:  Procedure Laterality Date   BREAST CYST EXCISION     BUBBLE STUDY  06/10/2021   Procedure: BUBBLE STUDY;  Surgeon: Lelon Perla, MD;  Location: [facility];  Service: Cardiovascular;;   CARDIAC CATHETERIZATION N/A 08/24/2015   Procedure: Left Heart Cath and Coronary Angiography;  Surgeon: Jettie Booze, MD;  Location: [facility];  Service: Cardiovascular;  Laterality: N/A;   COLONOSCOPY     CORONARY [place]   and then had procedure to repair in 2016   [place] N/A 06/10/2021   Procedure: LOOP RECORDER INSERTION;  Surgeon: Thompson Grayer, MD;  Location: [facility];  Service: Cardiovascular;  Laterality: N/A;   partial hysterec     TEE WITHOUT CARDIOVERSION N/A 06/10/2021   Procedure:  TRANSESOPHAGEAL ECHOCARDIOGRAM (TEE);  Surgeon: Lelon Perla, MD;  Location: [facility] ENDOS[facility];  Service: Cardiovascular;  Laterality: N/A;   THYROIDECTOMY      There were no vitals filed for this visit.   Subjective Assessment - 06/17/21 1234     Subjective "Trouble finding words."    Currently in Pain? Yes    Pain Score 7     Pain Location Hip    Pain Orientation Left    Pain Descriptors / Indicators Aching    Pain Onset 1 to 4 weeks ago    Pain Frequency Intermittent    Aggravating Factors  Laying on side (she has to lay on R side)                   ADULT SLP TREATMENT - 06/17/21 1550       General Information   Behavior/Cognition Alert;Cooperative;Pleasant mood      Pain Assessment   Pain Assessment 0-10    Pain Score 3     Pain Location hip      Cognitive-Linquistic Treatment   Treatment focused on Aphasia    Skilled Treatment Pt was trained in word finding strategies to assist with word finding difficulties (describe and delay). Pt reported that "family gives her time to  speak as needed." SLP encouraged pt to utilize "describe" and "delay" when instances of anomia occurs. SLP to cont. training word finding strategies and implement strategies into tasks next session.      Assessment / Recommendations / Plan   Plan Continue with current plan of care      Progression Toward Goals   Progression toward goals Progressing toward goals                SLP Short Term Goals - 06/14/21 1612       SLP SHORT TERM GOAL #1   Title Pt will participate in SLUMS screener in order to screen cognition.    Time 4    Period Weeks    Status New    Target Date 07/12/21      SLP SHORT TERM GOAL #2   Title Pt will recall 3 word finding strategies to use in cases of anomia with minA.    Time 4    Period Weeks    Status New    Target Date 07/12/21      SLP SHORT TERM GOAL #3   Title Pt daughter/family will recall 3 communication strategies in order to decrease  communication breakdowns with minA.    Time 4    Period Weeks    Status New    Target Date 07/12/21              SLP Long Term Goals - 06/14/21 1705       SLP LONG TERM GOAL #1   Title Pt and  daughter/family will report successful use of word finding strategies to decrease anomia in conversation.    Time 8    Period Weeks    Status New    Target Date 08/09/21      SLP LONG TERM GOAL #2   Title Pt and daughter/family will demonstrate use of communication strategies to reduce frustration and communication breakdowns during conversation.    Time 8    Period Weeks    Status New    Target Date 08/09/21      SLP LONG TERM GOAL #3   Title --    Time --    Period --    Status --    Target Date --              Plan - 06/17/21 1305     Clinical Impression Statement Pt is a 60 y.o. 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 Relevant pt hx includes previous CVA and COPD. When asked for reason of visit, pt stated "they told me to come here"; however, pt did endorse difficulty with word finding and frustration in conversation. Pt endorses mild difficulty with comprehending spoken language when given a considerable amount of information. Pt daughter was present for session and reports pt "says he for she and cannot say big words (multisyllabic)". Pt daughter also states pt often "does not make sense in conversation" causing communication breakdowns. Pt currently is managing own medication; however, feels confused on what medications to take due to being prescribed new medications. SLP screened pt's expressive and receptive language using the WAB-Bedside. See eval for scores. SLP made note that pt presented with phonemic paraphasias throughout evaluation and hesitated while writing sentences. SLP also assessed pt expressive language using [place] (BNT) where pt obtained a score of 8/15. Seven objects required phonemic cues in order to  achieve accuracy. SLP noted pt  exhibited most difficulty with pronouncing multisyllabic words due to the phonemic paraphasias. SLP rec skilled speech services to address word finding difficulties. Pt, pt's daughter, and SLP are in agreement that pt would benefit from speech services in order to decrease communication breakdowns and increase participation in conversation. SLP to screen pt's cognition next visit.    Speech Therapy Frequency 2x / week    Duration 8 weeks    Treatment/Interventions Compensatory strategies;Cueing hierarchy;Functional tasks;Patient/family education;Environmental controls;Cognitive reorganization;Multimodal communcation approach;Language facilitation;Compensatory techniques;Internal/external aids;SLP instruction and feedback    Potential to Achieve Goals Good    Potential Considerations Ability to learn/carryover information    Consulted and Agree with Plan of Care Patient;Family member/caregiver    Family Member Consulted Daughter - [name]             Patient will benefit from skilled therapeutic intervention in order to improve the following deficits and impairments:   Aphasia    Problem List Patient Active Problem List   Diagnosis Date Noted   Stroke (cerebrum) ([place]) 06/08/2021   Cerebral infarction ([place]) 06/08/2021   COPD exacerbation ([place]) 05/16/2021   Acute on chronic respiratory failure with hypoxia ([place]) 05/16/2021   Hypokalemia 05/16/2021   AKI (acute kidney injury) ([place]) 05/16/2021   LV (left ventricular) mural thrombus 05/25/2020   Renal infarct ([place]) 05/16/2020   Hypoxia 05/16/2020   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   Proptosis 12/27/2019   Multinodular goiter 03/27/2019   Hypothyroidism 09/26/2018   Sacroiliitis, not elsewhere classified ([place]) 04/24/2017   Chronic right-sided low back pain with right-sided sciatica 01/12/2017   Chronic bilateral thoracic back pain 01/12/2017   Other spondylosis with radiculopathy, lumbar region 10/19/2016    Depression with anxiety 07/16/2016   Chronic diastolic CHF (congestive heart failure) ([place]) 07/16/2016   Aortic atherosclerosis ([place]) 11/24/2015   Unstable angina ([place]) 08/21/2015   Chest pain    Coronary artery disease    CAD (coronary artery disease), native coronary artery 01/14/2015   Hypertension 01/14/2015   Hypercholesterolemia 01/14/2015   Tobacco abuse 01/14/2015    Rosann Auerbach [place] MS, [place], CBIS  06/17/2021, 4:01 PM  [place]. [place], [place], [address] Phone: [phone]   Fax:  [phone]   Name: VI BIDDINGER MRN: [id number] Date of Birth: 05-11-1961

## 2021-06-21 ENCOUNTER — Other Ambulatory Visit: Payer: Self-pay

## 2021-06-21 ENCOUNTER — Encounter: Payer: Self-pay | Admitting: Speech Pathology

## 2021-06-21 ENCOUNTER — Ambulatory Visit: Payer: Medicare Other | Admitting: Speech Pathology

## 2021-06-21 DIAGNOSIS — R4701 Aphasia: Secondary | ICD-10-CM | POA: Diagnosis not present

## 2021-06-21 DIAGNOSIS — J9621 Acute and chronic respiratory failure with hypoxia: Secondary | ICD-10-CM | POA: Diagnosis not present

## 2021-06-21 DIAGNOSIS — J441 Chronic obstructive pulmonary disease with (acute) exacerbation: Secondary | ICD-10-CM | POA: Diagnosis not present

## 2021-06-21 NOTE — Therapy (Addendum)
[place]. [place], [place], [address] Phone: [phone]   Fax:  [phone]  Speech Language Pathology Treatment  Patient Details  Name: Jocelyn [name] MRN: [id number] Date of Birth: Jun 20, 1961 Referring Provider (SLP): [name] MD   Encounter Date: 06/21/2021   End of Session - 06/21/21 1329     Visit Number 3    Number of Visits 17    Date for SLP Re-Evaluation 09/14/21    SLP Start Time 59    SLP Stop Time  1140    SLP Time Calculation (min) 40 min    Activity Tolerance Patient tolerated treatment well             Past Medical History:  Diagnosis Date   Anxiety    Arthritis    Chronic diastolic CHF (congestive heart failure), NYHA class 2 (HCC)    COPD (chronic obstructive pulmonary disease) ([place])    Depression    HTN (hypertension)    Hypercholesterolemia    Hypothyroidism (acquired)    Kidney stones 2015   Myocardial infarction ([place])    x3- pt unsure if MIs were in 1996 or 1999, had stent fixed in 2016 but no MI at that time per pt   Thyrotoxicosis    TIA (transient ischemic attack)    2013    Past Surgical History:  Procedure Laterality Date   BREAST CYST EXCISION     BUBBLE STUDY  06/10/2021   Procedure: BUBBLE STUDY;  Surgeon: Lelon Perla, MD;  Location: [facility];  Service: Cardiovascular;;   CARDIAC CATHETERIZATION N/A 08/24/2015   Procedure: Left Heart Cath and Coronary Angiography;  Surgeon: Jettie Booze, MD;  Location: [facility];  Service: Cardiovascular;  Laterality: N/A;   COLONOSCOPY     CORONARY [place]   and then had procedure to repair in 2016   [place] N/A 06/10/2021   Procedure: LOOP RECORDER INSERTION;  Surgeon: Thompson Grayer, MD;  Location: [facility];  Service: Cardiovascular;  Laterality: N/A;   partial hysterec     TEE WITHOUT CARDIOVERSION N/A 06/10/2021   Procedure:  TRANSESOPHAGEAL ECHOCARDIOGRAM (TEE);  Surgeon: Lelon Perla, MD;  Location: [facility] ENDOSCOPY;  Service: Cardiovascular;  Laterality: N/A;   THYROIDECTOMY      There were no vitals filed for this visit.   Subjective Assessment - 06/21/21 1328     Subjective "I have trouble with shes and hes."    Currently in Pain? No/denies                   ADULT SLP TREATMENT - 06/21/21 1210       General Information   Behavior/Cognition Alert;Cooperative;Pleasant mood      Treatment Provided   Treatment provided Cognitive-Linquistic      Pain Assessment   Pain Assessment No/denies pain      Cognitive-Linquistic Treatment   Treatment focused on Aphasia    Skilled Treatment SLP screened pt cognition using SLUMS where pt obtained a score of 20/30 indicating a mild cognitive communication. Pt obtained a 0/4 on the clock drawing due to her placing two 10's on the clock. Pt also obtained a score of 2/8 answering questions from a short story. Language may have impacted the assessment due to expressive language impairment. Pt reports success with using "delay" word finding strategy in conversation at home. SLP completed training pt in [place]  Word Finding Strategies. Pt expressed she would most benefit from delay, describe, look it up, and gesture. SLP observed pt utilizing "gesture" throughout session as she described objects in her environment. Pt participated in [employer] (SFA) where she used "describe" strategy to speak about features of objects. Pt required modA and benefited from multimodal cueing (verbal, visual, and orthorgaphic cues) to achieve accuracy. SLP encouraged pt to cont practicing word finding strategies at home in conversation as part of HEP.      Assessment / Recommendations / Plan   Plan Continue with current plan of care      Progression Toward Goals   Progression toward goals Progressing toward goals                SLP Short Term Goals -  06/14/21 1612       SLP SHORT TERM GOAL #1   Title Pt will participate in SLUMS screener in order to screen cognition.    Time 4    Period Weeks    Status New    Target Date 07/12/21      SLP SHORT TERM GOAL #2   Title Pt will recall 3 word finding strategies to use in cases of anomia with minA.    Time 4    Period Weeks    Status New    Target Date 07/12/21      SLP SHORT TERM GOAL #3   Title Pt daughter/family will recall 3 communication strategies in order to decrease communication breakdowns with minA.    Time 4    Period Weeks    Status New    Target Date 07/12/21              SLP Long Term Goals - 06/14/21 1705       SLP LONG TERM GOAL #1   Title Pt and  daughter/family will report successful use of word finding strategies to decrease anomia in conversation.    Time 8    Period Weeks    Status New    Target Date 08/09/21      SLP LONG TERM GOAL #2   Title Pt and daughter/family will demonstrate use of communication strategies to reduce frustration and communication breakdowns during conversation.    Time 8    Period Weeks    Status New    Target Date 08/09/21      SLP LONG TERM GOAL #3   Title --    Time --    Period --    Status --    Target Date --              Plan - 06/21/21 1329     Clinical Impression Statement See tx note. Cont with current POC.    Speech Therapy Frequency 2x / week    Duration 8 weeks    Treatment/Interventions Compensatory strategies;Cueing hierarchy;Functional tasks;Patient/family education;Environmental controls;Cognitive reorganization;Multimodal communcation approach;Language facilitation;Compensatory techniques;Internal/external aids;SLP instruction and feedback    Potential to Achieve Goals Good    Potential Considerations Ability to learn/carryover information    Consulted and Agree with Plan of Care Patient;Family member/caregiver    Family Member Consulted Daughter - [name]             Patient will  benefit from skilled therapeutic intervention in order to improve the following deficits and impairments:   Aphasia    Problem List Patient Active Problem List   Diagnosis Date Noted   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   Cerebral  infarction ([place]) 06/08/2021   COPD exacerbation ([place]) 05/16/2021   Acute on chronic respiratory failure with hypoxia ([place]) 05/16/2021   Hypokalemia 05/16/2021   AKI (acute kidney injury) ([place]) 05/16/2021   LV (left ventricular) mural thrombus 05/25/2020   Renal infarct ([place]) 05/16/2020   Hypoxia 05/16/2020   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   Proptosis 12/27/2019   Multinodular goiter 03/27/2019   Hypothyroidism 09/26/2018   Sacroiliitis, not elsewhere classified ([place]) 04/24/2017   Chronic right-sided low back pain with right-sided sciatica 01/12/2017   Chronic bilateral thoracic back pain 01/12/2017   Other spondylosis with radiculopathy, lumbar region 10/19/2016   Depression with anxiety 07/16/2016   Chronic diastolic CHF (congestive heart failure) ([place]) 07/16/2016   Aortic atherosclerosis ([place]) 11/24/2015   Unstable angina ([place]) 08/21/2015   Chest pain    Coronary artery disease    CAD (coronary artery disease), native coronary artery 01/14/2015   Hypertension 01/14/2015   Hypercholesterolemia 01/14/2015   Tobacco abuse 01/14/2015    Danise Mina, Student-SLP B.S. Communication Sciences and Disorders  06/21/2021, 1:37 PM  [place]. [place], [place], [address] Phone: [phone]   Fax:  [phone]   Name: JACOBA CHERNEY MRN: [id number] Date of Birth: [birth year]

## 2021-06-22 ENCOUNTER — Other Ambulatory Visit: Payer: Self-pay | Admitting: *Deleted

## 2021-06-22 ENCOUNTER — Ambulatory Visit: Payer: Medicare Other | Admitting: Speech Pathology

## 2021-06-22 ENCOUNTER — Encounter: Payer: Self-pay | Admitting: *Deleted

## 2021-06-22 ENCOUNTER — Encounter: Payer: Self-pay | Admitting: Speech Pathology

## 2021-06-22 DIAGNOSIS — R4701 Aphasia: Secondary | ICD-10-CM | POA: Diagnosis not present

## 2021-06-22 DIAGNOSIS — R41841 Cognitive communication deficit: Secondary | ICD-10-CM

## 2021-06-22 NOTE — Patient Outreach (Signed)
[facility]) Care Management  [place]  06/22/2021   Jocelyn [name] 1960-10-24 [id number]   RED ON EMMI ALERT - Stroke Day # 9 Date: 11/21 Red Alert Reason: Lost interest in things used to enjoy and Feeling sad/hopeless/anxious/empty   Social: Lives alone but currently staying with daughter.  She expresses concern for depression due to decreased independence during period of recovery.  Agrees to follow up from [facility] for counseling.    Conditions: Per chart, has history of CAD, HTN, CHF, Stroke, COPD, Hypothyroidism, and hypercholesterolemia.    Medications: Member state daughter has been helping with medication management, denies any questions regarding assistance of administration.   Appointments: Has been seen by PCP since discharge.  Will see cardiology tomorrow for wound check, will schedule with her regular cardiology (Dr.Varanasi) once that appointment is complete.   Advance Directives: Does not have in place, declines offer to have further education.  State her husband (separated but remain supportive) and children will make decisions despite formal paperwork.  Consent: Agrees to [facility] involvement.   Encounter Medications:  Outpatient Encounter Medications as of 06/22/2021  Medication Sig Note   albuterol (PROVENTIL HFA;VENTOLIN HFA) 108 (90 Base) MCG/ACT inhaler Inhale 2 puffs into the lungs in the morning and at bedtime.    ALPRAZolam (XANAX) 1 MG tablet Take 1 mg by mouth 3 (three) times daily as needed for anxiety.    aspirin EC 81 MG tablet Take 1 tablet (81 mg total) by mouth daily for 21 days. Swallow whole.    buPROPion (WELLBUTRIN XL) 300 MG 24 hr tablet Take 300 mg by mouth daily.    clopidogrel (PLAVIX) 75 MG tablet Take 1 tablet (75 mg total) by mouth daily.    cyclobenzaprine (FLEXERIL) 10 MG tablet Take 1 tablet (10 mg total) by mouth 3 (three) times daily as needed for muscle spasms.    furosemide (LASIX) 40 MG tablet Take 40 mg by  mouth daily.    gabapentin (NEURONTIN) 300 MG capsule Take 300 mg by mouth daily as needed (For pain).    ibuprofen (ADVIL) 200 MG tablet Take 400 mg by mouth every 6 (six) hours as needed for mild pain.    ipratropium-albuterol (DUONEB) 0.5-2.5 (3) MG/3ML SOLN Take 3 mLs by nebulization every 6 (six) hours as needed. (Patient taking differently: Take 3 mLs by nebulization every 6 (six) hours as needed (shortness of breath, wheezing).)    isosorbide mononitrate (IMDUR) 30 MG 24 hr tablet Take 2 tablets (60 mg total) by mouth daily.    levothyroxine (SYNTHROID) 112 MCG tablet TAKE 1 TABLET BY MOUTH EVERY MORNING    LINZESS 145 MCG CAPS capsule Take 145 mcg by mouth daily.    MELATONIN PO Take 1 tablet by mouth at bedtime as needed (sleep).    metoprolol succinate (TOPROL-XL) 25 MG 24 hr tablet Take 0.5 tablets (12.5 mg total) by mouth daily. 06/09/2021: Last dose 06/07/21   nitroGLYCERIN (NITROSTAT) 0.3 MG SL tablet Place 0.3 mg under the tongue every 5 (five) minutes as needed for chest pain.    rosuvastatin (CRESTOR) 20 MG tablet Take 1 tablet (20 mg total) by mouth daily.    triamcinolone cream (KENALOG) 0.5 % Apply 1 application topically daily as needed (for sun exposure).    No facility-administered encounter medications on file as of 06/22/2021.    Functional Status:  In your present state of health, do you have any difficulty performing the following activities: 06/08/2021 06/08/2021  Hearing? - N  Vision? - N  Difficulty concentrating or making decisions? - N  Walking or climbing stairs? - Y  Dressing or bathing? - Y  Doing errands, shopping? N -  Some recent data might be hidden    Fall/Depression Screening: Fall Risk  06/22/2021  Falls in the past year? 0  Number falls in past yr: 0  Injury with Fall? 0   PHQ 2/9 Scores 06/22/2021  PHQ - 2 Score 4  PHQ- 9 Score 8    Assessment:   Care Plan Care Plan : RNCM Plan of Care (Adult)  Updates made by [name], RN since  06/22/2021 12:00 AM     Problem: Care Coordination needs and assistance with managing chronic medical conditions (COPD & Stroke)   Priority: High     Long-Range Goal: Adequate management of chronic medical conditions (COPD & Stroke)   Start Date: 06/22/2021  Expected End Date: 12/19/2021  Priority: High  Note:   Current Barriers:  Knowledge Deficits related to plan of care for management of COPD, Tobacco Use, and Stroke Care Coordination needs related to Mental Health Concerns  Has depression post stroke  RNCM Clinical Goal(s):  Patient will verbalize understanding of plan for management of COPD, Tobacco Use, and Stroke take all medications exactly as prescribed and will call provider for medication related questions attend all scheduled medical appointments: Cardiology 11/23 continue to work with RN Care Manager to address care management and care coordination needs related to  COPD, Tobacco Use, and Stroke work with Education officer, museum to address  related to the management of [place] Concerns  related to the management of COPD, Tobacco Use, and Stroke  through collaboration with Consulting civil engineer, provider, and care team.   Interventions: Inter-disciplinary care team collaboration (see longitudinal plan of care) Evaluation of current treatment plan related to  self management and patient's adherence to plan as established by provider  StrokeNew goal. Evaluation of current treatment plan related to Tobacco Use and Stroke , Mental Health Concerns  self-management and patient's adherence to plan as established by provider. Discussed plans with patient for ongoing care management follow up and provided patient with direct contact information for care management team Provided education to patient re: smoking cessation and increased risk for recurrent stroke; Reviewed medications with patient and discussed notifying MD with changes; Social Work referral for depression, elevated PHQ9;  COPD  Interventions:   Advised patient to track and manage COPD triggers;  Discussed the importance of adequate rest and management of fatigue with COPD; Screening for signs and symptoms of depression related to chronic disease state;   Patient Goals/Self-Care Activities: Patient will self administer medications as prescribed Patient will attend all scheduled provider appointments Patient will continue to perform ADL's independently Patient will continue to perform IADL's independently  Follow Up Plan:  Telephone follow up appointment with care management team member scheduled for:  12/6 The patient has been provided with contact information for the care management team and has been advised to call with any health related questions or concerns.         Plan:  Follow-up: Patient agrees to Care Plan and Follow-up. Follow-up in 2 week(s).  [name], [place], MSN [place] [phone]

## 2021-06-22 NOTE — Therapy (Signed)
[facility] [place], [place], [address] Phone: [phone]   Fax:  [phone]  Speech Language Pathology Treatment  Patient Details  Name: Jocelyn [name] MRN: [id number] Date of Birth: [birth year] Referring Provider (SLP): [name] MD   Encounter Date: 06/22/2021   End of Session - 06/22/21 1523     Visit Number 4    Number of Visits 17    Date for SLP Re-Evaluation 09/14/21    SLP Start Time 68    SLP Stop Time  2585    SLP Time Calculation (min) 45 min    Activity Tolerance Patient tolerated treatment well             Past Medical History:  Diagnosis Date   Anxiety    Arthritis    Chronic diastolic CHF (congestive heart failure), NYHA class 2 (HCC)    COPD (chronic obstructive pulmonary disease) ([place])    Depression    HTN (hypertension)    Hypercholesterolemia    Hypothyroidism (acquired)    Kidney stones 2015   Myocardial infarction ([place])    x3- pt unsure if MIs were in 1996 or 1999, had stent fixed in 2016 but no MI at that time per pt   Thyrotoxicosis    TIA (transient ischemic attack)    2013    Past Surgical History:  Procedure Laterality Date   BREAST CYST EXCISION     BUBBLE STUDY  06/10/2021   Procedure: BUBBLE STUDY;  Surgeon: Lelon Perla, MD;  Location: [facility];  Service: Cardiovascular;;   CARDIAC CATHETERIZATION N/A 08/24/2015   Procedure: Left Heart Cath and Coronary Angiography;  Surgeon: Jettie Booze, MD;  Location: [facility];  Service: Cardiovascular;  Laterality: N/A;   COLONOSCOPY     CORONARY [place]   and then had procedure to repair in 2016   [place] N/A 06/10/2021   Procedure: LOOP RECORDER INSERTION;  Surgeon: Thompson Grayer, MD;  Location: [facility];  Service: Cardiovascular;  Laterality: N/A;   partial hysterec     TEE WITHOUT CARDIOVERSION N/A 06/10/2021   Procedure:  TRANSESOPHAGEAL ECHOCARDIOGRAM (TEE);  Surgeon: Lelon Perla, MD;  Location: [facility];  Service: Cardiovascular;  Laterality: N/A;   THYROIDECTOMY      There were no vitals filed for this visit.   Subjective Assessment - 06/22/21 1257     Subjective Pt reports she has been gesturing at home.                   ADULT SLP TREATMENT - 06/22/21 1302       General Information   Behavior/Cognition Alert;Cooperative;Pleasant mood      Treatment Provided   Treatment provided Cognitive-Linquistic      Cognitive-Linquistic Treatment   Treatment focused on Aphasia    Skilled Treatment Pt utilized expanding expression tool for description of objects to her daughter. Her daughter was instructed to guess the object from pt's description. Pt demonstrated difficulty narrowing down descriptions and required min-to-modA to complete description, for example: toaster "you put it in, mash the button, and bloop!" (with gestures), SLP spoke with pt about using more specific vocabulary. Pt in agreement saying, "youre right, she can't see that picture in her head."      Assessment / Recommendations / Plan   Plan Continue with current plan of care      Progression Toward  Goals   Progression toward goals Progressing toward goals                SLP Short Term Goals - 06/14/21 1612       SLP SHORT TERM GOAL #1   Title Pt will participate in SLUMS screener in order to screen cognition.    Time 4    Period Weeks    Status New    Target Date 07/12/21      SLP SHORT TERM GOAL #2   Title Pt will recall 3 word finding strategies to use in cases of anomia with minA.    Time 4    Period Weeks    Status New    Target Date 07/12/21      SLP SHORT TERM GOAL #3   Title Pt daughter/family will recall 3 communication strategies in order to decrease communication breakdowns with minA.    Time 4    Period Weeks    Status New    Target Date 07/12/21              SLP Long Term  Goals - 06/14/21 1705       SLP LONG TERM GOAL #1   Title Pt and  daughter/family will report successful use of word finding strategies to decrease anomia in conversation.    Time 8    Period Weeks    Status New    Target Date 08/09/21      SLP LONG TERM GOAL #2   Title Pt and daughter/family will demonstrate use of communication strategies to reduce frustration and communication breakdowns during conversation.    Time 8    Period Weeks    Status New    Target Date 08/09/21      SLP LONG TERM GOAL #3   Title --    Time --    Period --    Status --    Target Date --              Plan - 06/22/21 1523     Clinical Impression Statement See tx note. Daughter reported pt had difficulty with finding words at home and saying the wrong word. SLP edu on communication strategies to assist pt. Cont with current POC.    Speech Therapy Frequency 2x / week    Duration 8 weeks    Treatment/Interventions Compensatory strategies;Cueing hierarchy;Functional tasks;Patient/family education;Environmental controls;Cognitive reorganization;Multimodal communcation approach;Language facilitation;Compensatory techniques;Internal/external aids;SLP instruction and feedback    Potential to Achieve Goals Good    Potential Considerations Ability to learn/carryover information    Consulted and Agree with Plan of Care Patient;Family member/caregiver    Family Member Consulted Daughter - [name]             Patient will benefit from skilled therapeutic intervention in order to improve the following deficits and impairments:   Cognitive communication deficit    Problem List Patient Active Problem List   Diagnosis Date Noted   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   Cerebral infarction ([place]) 06/08/2021   COPD exacerbation ([place]) 05/16/2021   Acute on chronic respiratory failure with hypoxia ([place]) 05/16/2021   Hypokalemia 05/16/2021   AKI (acute kidney injury) ([place]) 05/16/2021   LV (left  ventricular) mural thrombus 05/25/2020   Renal infarct ([place]) 05/16/2020   Hypoxia 05/16/2020   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   Proptosis 12/27/2019   Multinodular goiter 03/27/2019   Hypothyroidism 09/26/2018   Sacroiliitis, not elsewhere classified ([place]) 04/24/2017   Chronic right-sided low back pain  with right-sided sciatica 01/12/2017   Chronic bilateral thoracic back pain 01/12/2017   Other spondylosis with radiculopathy, lumbar region 10/19/2016   Depression with anxiety 07/16/2016   Chronic diastolic CHF (congestive heart failure) ([place]) 07/16/2016   Aortic atherosclerosis ([place]) 11/24/2015   Unstable angina ([place]) 08/21/2015   Chest pain    Coronary artery disease    CAD (coronary artery disease), native coronary artery 01/14/2015   Hypertension 01/14/2015   Hypercholesterolemia 01/14/2015   Tobacco abuse 01/14/2015    Verdene Lennert, CCC-SLP 06/22/2021, 3:42 PM  [facility]. [place], [place], [address] Phone: [phone]   Fax:  [phone]   Name: WOODIE TRUSTY MRN: [id number] Date of Birth: June 17, 1961

## 2021-06-23 ENCOUNTER — Ambulatory Visit (INDEPENDENT_AMBULATORY_CARE_PROVIDER_SITE_OTHER): Payer: Medicare Other

## 2021-06-23 ENCOUNTER — Other Ambulatory Visit: Payer: Self-pay

## 2021-06-23 DIAGNOSIS — I639 Cerebral infarction, unspecified: Secondary | ICD-10-CM

## 2021-06-23 LAB — CUP PACEART INCLINIC DEVICE CHECK
Date Time Interrogation Session: 20221123090607
Implantable Pulse Generator Implant Date: 20221110

## 2021-06-23 NOTE — Progress Notes (Signed)
ILR wound check in clinic. Steri strips removed. Wound well healed, incision edges approximated no redness or edema.  Home monitor transmitting nightly. No episodes. Patient and family educated about wound care and home monitor.

## 2021-06-23 NOTE — Patient Instructions (Signed)
   After Your Loop Recorder (ILR)   Monitor implant site for redness, swelling, and drainage. Call the device clinic at [phone] if you experience these symptoms or fever/chills.  You may shower 3 days after your loop recorder implant and wash your incision with soap and water. Avoid lotions, ointments, or perfumes over your incision until it is well-healed.  You may use a hot tub or a pool after your wound check appointment if the incision is completely closed.   Your Loop Recorder is MRI compatible.  Remote monitoring is used to monitor your pacemaker from home. This monitoring is scheduled every 31 days by our office.

## 2021-06-28 ENCOUNTER — Encounter: Payer: Self-pay | Admitting: Speech Pathology

## 2021-06-28 ENCOUNTER — Ambulatory Visit: Payer: Medicare Other | Admitting: Speech Pathology

## 2021-06-28 ENCOUNTER — Other Ambulatory Visit: Payer: Self-pay

## 2021-06-28 DIAGNOSIS — R41841 Cognitive communication deficit: Secondary | ICD-10-CM

## 2021-06-28 DIAGNOSIS — R4701 Aphasia: Secondary | ICD-10-CM | POA: Diagnosis not present

## 2021-06-28 NOTE — Therapy (Signed)
[name]. [place], [place], [address] Phone: [phone]   Fax:  [phone]  Speech Language Pathology Treatment  Patient Details  Name: Jocelyn [name] MRN: [id number] Date of Birth: 27-Jun-1961 Referring Provider (SLP): [name] MD   Encounter Date: 06/28/2021   End of Session - 06/28/21 1135     Visit Number 5    Number of Visits 17    Date for SLP Re-Evaluation 09/14/21    SLP Start Time 1108    SLP Stop Time  1146    SLP Time Calculation (min) 38 min    Activity Tolerance Patient tolerated treatment well             Past Medical History:  Diagnosis Date   Anxiety    Arthritis    Chronic diastolic CHF (congestive heart failure), NYHA class 2 (HCC)    COPD (chronic obstructive pulmonary disease) ([place])    Depression    HTN (hypertension)    Hypercholesterolemia    Hypothyroidism (acquired)    Kidney stones 2015   Myocardial infarction ([place])    x3- pt unsure if MIs were in 1996 or 1999, had stent fixed in 2016 but no MI at that time per pt   Thyrotoxicosis    TIA (transient ischemic attack)    2013    Past Surgical History:  Procedure Laterality Date   BREAST CYST EXCISION     BUBBLE STUDY  06/10/2021   Procedure: BUBBLE STUDY;  Surgeon: Lelon Perla, MD;  Location: [facility];  Service: Cardiovascular;;   CARDIAC CATHETERIZATION N/A 08/24/2015   Procedure: Left Heart Cath and Coronary Angiography;  Surgeon: Jettie Booze, MD;  Location: [facility];  Service: Cardiovascular;  Laterality: N/A;   COLONOSCOPY     CORONARY [place]   and then had procedure to repair in 2016   [place] N/A 06/10/2021   Procedure: LOOP RECORDER INSERTION;  Surgeon: Thompson Grayer, MD;  Location: [facility];  Service: Cardiovascular;  Laterality: N/A;   partial hysterec     TEE WITHOUT CARDIOVERSION N/A 06/10/2021   Procedure:  TRANSESOPHAGEAL ECHOCARDIOGRAM (TEE);  Surgeon: Lelon Perla, MD;  Location: [facility] END[facility];  Service: Cardiovascular;  Laterality: N/A;   THYROIDECTOMY      There were no vitals filed for this visit.   Subjective Assessment - 06/28/21 1635     Subjective "Things have been going good, but I had difficulty at Thanksgiving getting a word in."    Currently in Pain? No/denies                   ADULT SLP TREATMENT - 06/28/21 1241       General Information   Behavior/Cognition Alert;Cooperative      Treatment Provided   Treatment provided Cognitive-Linquistic      Cognitive-Linquistic Treatment   Treatment focused on Aphasia    Skilled Treatment Pt reports success with utilizing word finding strategies at home; however, pt expressed feelings of embarrassment due to word finding difficulty during a speech at family member funeral. SLP encouraged pt to write a script for what she wants to say to decrease instances of anomia. Pt and daughter also report difficulty with pronouns -- "she for he and he for she". Pt participated in a structured conversation task where she created a hypothetical conversation between people based on the given picture. She was able  to complete task with less than 3 verbal cues. Pt also completed a topic maintenance task where she was instructed to generate a comment, question, and reply to a question based on a topic. She required spv for word finding. No instances of anomia noted this session.      Assessment / Recommendations / Plan   Plan Continue with current plan of care      Progression Toward Goals   Progression toward goals Progressing toward goals                SLP Short Term Goals - 06/28/21 1139       SLP SHORT TERM GOAL #1   Title Pt will participate in SLUMS screener in order to screen cognition.    Time 4    Period Weeks    Status Achieved    Target Date 07/12/21      SLP SHORT TERM GOAL #2   Title Pt will recall 3 word  finding strategies to use in cases of anomia with minA.    Time 4    Period Weeks    Status New    Target Date 07/12/21      SLP SHORT TERM GOAL #3   Title Pt daughter/family will recall 3 communication strategies in order to decrease communication breakdowns with minA.    Time 4    Period Weeks    Status New    Target Date 07/12/21              SLP Long Term Goals - 06/14/21 1705       SLP LONG TERM GOAL #1   Title Pt and  daughter/family will report successful use of word finding strategies to decrease anomia in conversation.    Time 8    Period Weeks    Status New    Target Date 08/09/21      SLP LONG TERM GOAL #2   Title Pt and daughter/family will demonstrate use of communication strategies to reduce frustration and communication breakdowns during conversation.    Time 8    Period Weeks    Status New    Target Date 08/09/21      SLP LONG TERM GOAL #3   Title --    Time --    Period --    Status --    Target Date --              Plan - 06/28/21 1136     Clinical Impression Statement See tx note. Daughter and pt reported she had trouble at a funeral when she had to stand to speak and she forgot what she wanted to say. Pt reported feeling embarrassed. SLP spoke about scripting prior to having conversations in preparation. SLP edu on communication strategies to assist pt. Cont with current POC.    Speech Therapy Frequency 2x / week    Duration 8 weeks    Treatment/Interventions Compensatory strategies;Cueing hierarchy;Functional tasks;Patient/family education;Environmental controls;Cognitive reorganization;Multimodal communcation approach;Language facilitation;Compensatory techniques;Internal/external aids;SLP instruction and feedback    Potential to Achieve Goals Good    Potential Considerations Ability to learn/carryover information    Consulted and Agree with Plan of Care Patient;Family member/caregiver    Family Member Consulted Daughter - [name]              Patient will benefit from skilled therapeutic intervention in order to improve the following deficits and impairments:   Cognitive communication deficit    Problem List Patient Active Problem List  Diagnosis Date Noted   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) 05/16/2021   Acute on chronic respiratory failure with hypoxia ([place]) 05/16/2021   Hypokalemia 05/16/2021   AKI (acute kidney injury) ([place]) 05/16/2021   LV (left ventricular) mural thrombus 05/25/2020   Renal infarct ([place]) 05/16/2020   Hypoxia 05/16/2020   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   Proptosis 12/27/2019   Multinodular goiter 03/27/2019   Hypothyroidism 09/26/2018   Sacroiliitis, not elsewhere classified ([place]) 04/24/2017   Chronic right-sided low back pain with right-sided sciatica 01/12/2017   Chronic bilateral thoracic back pain 01/12/2017   Other spondylosis with radiculopathy, lumbar region 10/19/2016   Depression with anxiety 07/16/2016   Chronic diastolic CHF (congestive heart failure) ([place]) 07/16/2016   Aortic atherosclerosis ([place]) 11/24/2015   Unstable angina ([facility]) 08/21/2015   Chest pain    Coronary artery disease    CAD (coronary artery disease), native coronary artery 01/14/2015   Hypertension 01/14/2015   Hypercholesterolemia 01/14/2015   Tobacco abuse 01/14/2015    Verdene Lennert, CCC-SLP 06/28/2021, 4:37 PM  [facility]. [place], [place], [address] Phone: [phone]   Fax:  [phone]   Name: TREANNA DUMLER MRN: [id number] Date of Birth: [birth year]

## 2021-06-29 ENCOUNTER — Other Ambulatory Visit: Payer: Self-pay | Admitting: *Deleted

## 2021-06-29 DIAGNOSIS — E1165 Type 2 diabetes mellitus with hyperglycemia: Secondary | ICD-10-CM | POA: Diagnosis not present

## 2021-06-29 DIAGNOSIS — I2510 Atherosclerotic heart disease of native coronary artery without angina pectoris: Secondary | ICD-10-CM | POA: Diagnosis not present

## 2021-06-29 DIAGNOSIS — E039 Hypothyroidism, unspecified: Secondary | ICD-10-CM | POA: Diagnosis not present

## 2021-06-29 DIAGNOSIS — J441 Chronic obstructive pulmonary disease with (acute) exacerbation: Secondary | ICD-10-CM | POA: Diagnosis not present

## 2021-06-29 DIAGNOSIS — I10 Essential (primary) hypertension: Secondary | ICD-10-CM | POA: Diagnosis not present

## 2021-06-29 DIAGNOSIS — I5032 Chronic diastolic (congestive) heart failure: Secondary | ICD-10-CM | POA: Diagnosis not present

## 2021-06-29 DIAGNOSIS — L0292 Furuncle, unspecified: Secondary | ICD-10-CM | POA: Diagnosis not present

## 2021-06-29 DIAGNOSIS — I6389 Other cerebral infarction: Secondary | ICD-10-CM | POA: Diagnosis not present

## 2021-06-29 DIAGNOSIS — I872 Venous insufficiency (chronic) (peripheral): Secondary | ICD-10-CM | POA: Diagnosis not present

## 2021-06-29 DIAGNOSIS — M62838 Other muscle spasm: Secondary | ICD-10-CM | POA: Diagnosis not present

## 2021-06-29 DIAGNOSIS — E782 Mixed hyperlipidemia: Secondary | ICD-10-CM | POA: Diagnosis not present

## 2021-06-29 NOTE — Patient Outreach (Signed)
[facility]) Care Management  06/29/2021  Jocelyn [name] 1960/11/06 [id number]   RED ON EMMI ALERT - Stroke Day # 13 Date: 11/25 Red Alert Reason: Feeling worse overall?  Yes   Outreach attempt #1, successful to member.  State she is doing well, does not feel worse.  Report she attempted to tell automated system she was not having problems but speech is slow currently due to aphasia.  Denies any urgent concerns, encouraged to contact this care manager with questions.    Care Plan : [facility] Plan of Care (Adult)  Updates made by [name], RN since 06/29/2021 12:00 AM     Problem: Care Coordination needs and assistance with managing chronic medical conditions (COPD & Stroke)   Priority: High     Long-Range Goal: Adequate management of chronic medical conditions (COPD & Stroke)   Start Date: 06/22/2021  Expected End Date: 12/19/2021  This Visit's Progress: On track  Priority: High  Note:   Current Barriers:  Knowledge Deficits related to plan of care for management of COPD, Tobacco Use, and Stroke Care Coordination needs related to Mental Health Concerns  Has depression post stroke  RNCM Clinical Goal(s):  Patient will verbalize understanding of plan for management of COPD, Tobacco Use, and Stroke take all medications exactly as prescribed and will call provider for medication related questions attend all scheduled medical appointments: Cardiology 11/23 continue to work with RN Care Manager to address care management and care coordination needs related to  COPD, Tobacco Use, and Stroke work with Education officer, museum to address  related to the management of [place] Concerns  related to the management of COPD, Tobacco Use, and Stroke  through collaboration with Consulting civil engineer, provider, and care team.   Interventions: Inter-disciplinary care team collaboration (see longitudinal plan of care) Evaluation of current treatment plan related to  self management and  patient's adherence to plan as established by provider  StrokeNew goal. Evaluation of current treatment plan related to Tobacco Use and Stroke , Mental Health Concerns  self-management and patient's adherence to plan as established by provider. Discussed plans with patient for ongoing care management follow up and provided patient with direct contact information for care management team Provided education to patient re: smoking cessation and increased risk for recurrent stroke; Reviewed medications with patient and discussed notifying MD with changes; Social Work referral for depression, elevated PHQ9;  COPD Interventions:   Advised patient to track and manage COPD triggers;  Discussed the importance of adequate rest and management of fatigue with COPD; Screening for signs and symptoms of depression related to chronic disease state;   Patient Goals/Self-Care Activities: Patient will self administer medications as prescribed Patient will attend all scheduled provider appointments Patient will continue to perform ADL's independently Patient will continue to perform IADL's independently  Follow Up Plan:  Telephone follow up appointment with care management team member scheduled for:  12/6 The patient has been provided with contact information for the care management team and has been advised to call with any health related questions or concerns.    Update 11/29 - Red EMMI alert received today, denies complaints.  State she is improving since starting outpatient speech therapy.       Plan: RN CM will follow up within the next week as planned.  [name], [place], MSN [place] [phone]

## 2021-06-30 ENCOUNTER — Ambulatory Visit: Payer: Medicare Other | Admitting: *Deleted

## 2021-06-30 ENCOUNTER — Other Ambulatory Visit: Payer: Self-pay | Admitting: *Deleted

## 2021-07-01 ENCOUNTER — Encounter: Payer: Self-pay | Admitting: *Deleted

## 2021-07-01 ENCOUNTER — Other Ambulatory Visit: Payer: Self-pay

## 2021-07-01 ENCOUNTER — Encounter: Payer: Self-pay | Admitting: Speech Pathology

## 2021-07-01 ENCOUNTER — Ambulatory Visit: Payer: Medicare Other | Attending: Internal Medicine | Admitting: Speech Pathology

## 2021-07-01 DIAGNOSIS — R4701 Aphasia: Secondary | ICD-10-CM | POA: Insufficient documentation

## 2021-07-01 NOTE — Therapy (Signed)
Jocelyn [name]. [place], [place], [address] Phone: [phone]   Fax:  [phone]  Speech Language Pathology Treatment  Patient Details  Name: Jocelyn [name] MRN: [id number] Date of Birth: 1961/06/07 Referring Provider (SLP): [name] MD   Encounter Date: 07/01/2021   End of Session - 07/01/21 1239     Visit Number 6    Number of Visits 17    Date for SLP Re-Evaluation 09/14/21    SLP Start Time 1236    SLP Stop Time  0350    SLP Time Calculation (min) 39 min    Activity Tolerance Patient tolerated treatment well             Past Medical History:  Diagnosis Date   Anxiety    Arthritis    Chronic diastolic CHF (congestive heart failure), NYHA class 2 (HCC)    COPD (chronic obstructive pulmonary disease) ([place])    Depression    HTN (hypertension)    Hypercholesterolemia    Hypothyroidism (acquired)    Kidney stones 2015   Myocardial infarction ([place])    x3- pt unsure if MIs were in 1996 or 1999, had stent fixed in 2016 but no MI at that time per pt   Thyrotoxicosis    TIA (transient ischemic attack)    2013    Past Surgical History:  Procedure Laterality Date   BREAST CYST EXCISION     BUBBLE STUDY  06/10/2021   Procedure: BUBBLE STUDY;  Surgeon: Lelon Perla, MD;  Location: [facility];  Service: Cardiovascular;;   CARDIAC CATHETERIZATION N/A 08/24/2015   Procedure: Left Heart Cath and Coronary Angiography;  Surgeon: Jettie Booze, MD;  Location: [facility];  Service: Cardiovascular;  Laterality: N/A;   COLONOSCOPY     CORONARY [place]   and then had procedure to repair in 2016   [place] N/A 06/10/2021   Procedure: LOOP RECORDER INSERTION;  Surgeon: Thompson Grayer, MD;  Location: [facility];  Service: Cardiovascular;  Laterality: N/A;   partial hysterec     TEE WITHOUT CARDIOVERSION N/A 06/10/2021   Procedure:  TRANSESOPHAGEAL ECHOCARDIOGRAM (TEE);  Surgeon: Lelon Perla, MD;  Location: [facility];  Service: Cardiovascular;  Laterality: N/A;   THYROIDECTOMY      There were no vitals filed for this visit.   Subjective Assessment - 07/01/21 1239     Subjective "I went to the doctor and she said it was a no-go on driving."    Currently in Pain? No/denies                   ADULT SLP TREATMENT - 07/01/21 1634       General Information   Behavior/Cognition Alert;Cooperative      Treatment Provided   Treatment provided Cognitive-Linquistic      Cognitive-Linquistic Treatment   Treatment focused on Aphasia    Skilled Treatment Facilitated increased word retrieval during verbal expression task. Pt was asked to generate a list of "rooms" in the house. Pt required modA to begin, naming re: cabinets, walls, floor. SLP stopped task and repeated/reiterated the instructions. Pt began naming items re: sink. SLP provided an example of a "room" in a house re: kitchen. After this cue, pt required minA to modA to generate a list of rooms in the house. *Pt reports she feels like when she "calms down" she is able to get  her words out better. SLP edu on emotions and the impact they can have on aphasia. Also edu on BE FAST for CVA when pt asked questions regarding stroke and speech/language.      Assessment / Recommendations / Plan   Plan Continue with current plan of care      Progression Toward Goals   Progression toward goals Progressing toward goals                SLP Short Term Goals - 06/28/21 1139       SLP SHORT TERM GOAL #1   Title Pt will participate in SLUMS screener in order to screen cognition.    Time 4    Period Weeks    Status Achieved    Target Date 07/12/21      SLP SHORT TERM GOAL #2   Title Pt will recall 3 word finding strategies to use in cases of anomia with minA.    Time 4    Period Weeks    Status New    Target Date 07/12/21      SLP SHORT TERM GOAL  #3   Title Pt daughter/family will recall 3 communication strategies in order to decrease communication breakdowns with minA.    Time 4    Period Weeks    Status New    Target Date 07/12/21              SLP Long Term Goals - 06/14/21 1705       SLP LONG TERM GOAL #1   Title Pt and  daughter/family will report successful use of word finding strategies to decrease anomia in conversation.    Time 8    Period Weeks    Status New    Target Date 08/09/21      SLP LONG TERM GOAL #2   Title Pt and daughter/family will demonstrate use of communication strategies to reduce frustration and communication breakdowns during conversation.    Time 8    Period Weeks    Status New    Target Date 08/09/21      SLP LONG TERM GOAL #3   Title --    Time --    Period --    Status --    Target Date --              Plan - 07/01/21 1242     Clinical Impression Statement See tx note. Pt was challenged using time pressure today. Will cont to simulate conversations in unfamiliar environments. Cont with current POC.    Speech Therapy Frequency 2x / week    Duration 8 weeks    Treatment/Interventions Compensatory strategies;Cueing hierarchy;Functional tasks;Patient/family education;Environmental controls;Cognitive reorganization;Multimodal communcation approach;Language facilitation;Compensatory techniques;Internal/external aids;SLP instruction and feedback    Potential to Achieve Goals Good    Potential Considerations Ability to learn/carryover information    Consulted and Agree with Plan of Care Patient;Family member/caregiver    Family Member Consulted Daughter - [name]             Patient will benefit from skilled therapeutic intervention in order to improve the following deficits and impairments:   Aphasia    Problem List Patient Active Problem List   Diagnosis Date Noted   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   Cerebral infarction ([place]) 06/08/2021   COPD exacerbation ([place])  05/16/2021   Acute on chronic respiratory failure with hypoxia ([place]) 05/16/2021   Hypokalemia 05/16/2021   AKI (acute kidney injury) ([place]) 05/16/2021   LV (left  ventricular) mural thrombus 05/25/2020   Renal infarct ([place]) 05/16/2020   Hypoxia 05/16/2020   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   Proptosis 12/27/2019   Multinodular goiter 03/27/2019   Hypothyroidism 09/26/2018   Sacroiliitis, not elsewhere classified ([place]) 04/24/2017   Chronic right-sided low back pain with right-sided sciatica 01/12/2017   Chronic bilateral thoracic back pain 01/12/2017   Other spondylosis with radiculopathy, lumbar region 10/19/2016   Depression with anxiety 07/16/2016   Chronic diastolic CHF (congestive heart failure) ([place]) 07/16/2016   Aortic atherosclerosis ([place]) 11/24/2015   Unstable angina ([place]) 08/21/2015   Chest pain    Coronary artery disease    CAD (coronary artery disease), native coronary artery 01/14/2015   Hypertension 01/14/2015   Hypercholesterolemia 01/14/2015   Tobacco abuse 01/14/2015    Verdene Lennert, CCC-SLP 07/01/2021, 4:39 PM  [facility]. [place], [place], [address] Phone: [phone]   Fax:  [phone]   Name: Jocelyn [name] MRN: [id number] Date of Birth: 1961-03-28

## 2021-07-01 NOTE — Patient Outreach (Signed)
Care Management Clinical Social Work Note  07/01/2021 Name: Jocelyn [name] MRN: [id number] DOB: 03-Aug-1960  Jocelyn [name] is a 60 y.o. year old female who is a primary care patient of Osei-Bonsu, Iona Beard, MD.  The Care Management team was consulted for assistance with chronic disease management and coordination needs.  Engaged with patient by telephone for initial visit in response to provider referral for social work chronic care management and care coordination services  Consent to Services:  Ms. Mcdevitt was given information about Care Management services today including:  Care Management services includes personalized support from designated clinical staff supervised by her physician, including individualized plan of care and coordination with other care providers 24/7 contact phone numbers for assistance for urgent and routine care needs. The patient may stop case management services at any time by phone call to the office staff.  Patient agreed to services and consent obtained.   Assessment: Review of patient past medical history, allergies, medications, and health status, including review of relevant consultants reports was performed today as part of a comprehensive evaluation and provision of chronic care management and care coordination services.  SDOH (Social Determinants of Health) assessments and interventions performed:  SDOH Interventions    Flowsheet Row Most Recent Value  SDOH Interventions   Food Insecurity Interventions Intervention Not Indicated  Financial Strain Interventions Intervention Not Indicated  Housing Interventions Intervention Not Indicated  Intimate Partner Violence Interventions Intervention Not Indicated  Physical Activity Interventions Intervention Not Indicated, Patient Refused  Stress Interventions Offered [employer] Resources, Provide Counseling, Other (Comment)  [Referral to Psychiatry]  Social Connections Interventions Intervention Not  Indicated, Patient Refused  Transportation Interventions Intervention Not Indicated        Advanced Directives Status: Not ready or willing to discuss.  Care Plan  Allergies  Allergen Reactions   Hydrocodone Nausea Only    Has problems with high doses    Outpatient Encounter Medications as of 06/30/2021  Medication Sig Note   ALPRAZolam (XANAX) 1 MG tablet Take 1 mg by mouth 3 (three) times daily as needed for anxiety.    aspirin EC 81 MG tablet Take 1 tablet (81 mg total) by mouth daily for 21 days. Swallow whole.    buPROPion (WELLBUTRIN XL) 300 MG 24 hr tablet Take 300 mg by mouth daily.    clopidogrel (PLAVIX) 75 MG tablet Take 1 tablet (75 mg total) by mouth daily.    cyclobenzaprine (FLEXERIL) 10 MG tablet Take 1 tablet (10 mg total) by mouth 3 (three) times daily as needed for muscle spasms.    furosemide (LASIX) 40 MG tablet Take 40 mg by mouth daily.    gabapentin (NEURONTIN) 300 MG capsule Take 300 mg by mouth daily as needed (For pain).    ibuprofen (ADVIL) 200 MG tablet Take 400 mg by mouth every 6 (six) hours as needed for mild pain.    ipratropium-albuterol (DUONEB) 0.5-2.5 (3) MG/3ML SOLN Take 3 mLs by nebulization every 6 (six) hours as needed. (Patient taking differently: Take 3 mLs by nebulization every 6 (six) hours as needed (shortness of breath, wheezing).)    isosorbide mononitrate (IMDUR) 30 MG 24 hr tablet Take 2 tablets (60 mg total) by mouth daily.    levothyroxine (SYNTHROID) 112 MCG tablet TAKE 1 TABLET BY MOUTH EVERY MORNING    LINZESS 145 MCG CAPS capsule Take 145 mcg by mouth daily.    MELATONIN PO Take 1 tablet by mouth at bedtime as needed (sleep).    metoprolol succinate (  TOPROL-XL) 25 MG 24 hr tablet Take 0.5 tablets (12.5 mg total) by mouth daily. 06/09/2021: Last dose 06/07/21   nitroGLYCERIN (NITROSTAT) 0.3 MG SL tablet Place 0.3 mg under the tongue every 5 (five) minutes as needed for chest pain.    rosuvastatin (CRESTOR) 20 MG tablet Take 1  tablet (20 mg total) by mouth daily.    triamcinolone cream (KENALOG) 0.5 % Apply 1 application topically daily as needed (for sun exposure).    No facility-administered encounter medications on file as of 06/30/2021.    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   Cerebral infarction ([place]) 06/08/2021   COPD exacerbation ([place]) 05/16/2021   Acute on chronic respiratory failure with hypoxia ([place]) 05/16/2021   Hypokalemia 05/16/2021   AKI (acute kidney injury) ([place]) 05/16/2021   LV (left ventricular) mural thrombus 05/25/2020   Renal infarct ([place]) 05/16/2020   Hypoxia 05/16/2020   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   Proptosis 12/27/2019   Multinodular goiter 03/27/2019   Hypothyroidism 09/26/2018   Sacroiliitis, not elsewhere classified ([place]) 04/24/2017   Chronic right-sided low back pain with right-sided sciatica 01/12/2017   Chronic bilateral thoracic back pain 01/12/2017   Other spondylosis with radiculopathy, lumbar region 10/19/2016   Depression with anxiety 07/16/2016   Chronic diastolic CHF (congestive heart failure) ([place]) 07/16/2016   Aortic atherosclerosis ([place]) 11/24/2015   Unstable angina ([place]) 08/21/2015   Chest pain    Coronary artery disease    CAD (coronary artery disease), native coronary artery 01/14/2015   Hypertension 01/14/2015   Hypercholesterolemia 01/14/2015   Tobacco abuse 01/14/2015    Conditions to be addressed/monitored: Anxiety and Depression.  Limited Social Support, Mental Health Concerns, Social Isolation, Limited Access to Caregiver, and Lacks Knowledge of [employer].  Care Plan : LCSW Plan of Care  Updates made by Francis Gaines, LCSW since 07/01/2021 12:00 AM     Problem: Reduce and Manage My Symptoms of Anxiety and Depression.   Priority: High     Goal: Reduce and Manage My Symptoms of Anxiety and Depression.   Start Date: 06/30/2021  Expected End Date: 08/30/2021  This Visit's Progress:  On track  Priority: High  Note:   Current Barriers:   Acute Mental Health needs related to recent Stroke, Anxiety and Depression, requires Support, Education, Resources, Referrals, Advocacy, and Care Coordination, in order to meet unmet mental health needs. Clinical Goal(s):  Patient will work with LCSW to reduce and manage symptoms of Anxiety and Depression, until well-established with a community mental health provider.     Patient will increase knowledge and/or ability of:        Coping Skills, Healthy Habits, Self-Management Skills, Stress Reduction, Home Safety and Utilizing [employer] and Resources.   Clinical Interventions:  Assessed patient's previous treatment, needs, coping skills, current treatment, support system and barriers to care. PHQ-2 and PHQ-9 Depression Screening Tool performed and results reviewed with patient. Mindfulness Meditation Strategies, Relaxation Techniques and Deep Breathing Exercises taught and encouraged, daily. Solution-Focused Therapy performed, Verbalization of Feelings encouraged, Emotional Support provided, Problem Solving Solutions developed, Brief Cognitive Behavioral Therapy initiated, Quality of Sleep assessed and Sleep Hygiene Techniques promoted, Support Group Participation encouraged, Increase Level of Activity/Exercise emphasized.    Discussed plans with patient for ongoing care management follow-up and provided patient with direct contact information for care management team. Discussed several options for long-term counseling based on need and insurance through [employer] and Adult Medicaid.  Collaboration with Primary Care Physician, Dr. Iona Beard Osei-Bonsu regarding development and update of comprehensive plan of care as evidenced by provider attestation and co-signature. Inter-disciplinary care team collaboration (see longitudinal plan of care). Patient Goals/Self-Care Activities: Begin personal counseling with LCSW, on a  bi-weekly basis, to reduce and manage symptoms of Anxiety and Depression, until well-established with a community mental health provider.   LCSW collaboration with Dr. Patriciaann Clan, Psychiatric Physician Assistant and CEO of [facility], [facility] [phone]), to confirm that he is accepting new clients, as well as to schedule your initial appointment. ~ HIPAA compliant message left on voicemail.  Awaiting a return call. ~  Incorporate into daily practice - relaxation techniques, deep breathing exercises and mindfulness meditation strategies. Contact LCSW directly (# [id number]) if you have questions, need assistance, or if additional social work needs are identified between now and our next scheduled telephone outreach call.  Follow-Up:  07/15/2021 at 1:00 pm      Nat Christen, BSW, MSW, [place]  Licensed Clinical Social Worker  [facility]  Mailing [address], [place], [place] [address] Physical Address-[address], [place], [place] [address] Toll Free Main # [phone] Fax # [phone] Cell # [phone]  [email or web address]

## 2021-07-05 ENCOUNTER — Ambulatory Visit: Payer: Medicare Other | Admitting: Speech Pathology

## 2021-07-05 ENCOUNTER — Encounter: Payer: Self-pay | Admitting: Speech Pathology

## 2021-07-05 ENCOUNTER — Other Ambulatory Visit: Payer: Self-pay

## 2021-07-05 DIAGNOSIS — R4701 Aphasia: Secondary | ICD-10-CM

## 2021-07-05 NOTE — Therapy (Signed)
[facility]. [place], [place], [address] Phone: [phone]   Fax:  [phone]  Speech Language Pathology Treatment  Patient Details  Name: Jocelyn [name] MRN: [id number] Date of Birth: Mar 14, 1961 Referring Provider (SLP): [name] MD   Encounter Date: 07/05/2021   End of Session - 07/05/21 1222     Visit Number 7    Number of Visits 17    Date for SLP Re-Evaluation 09/14/21    SLP Start Time 1113    SLP Stop Time  1205    SLP Time Calculation (min) 52 min    Activity Tolerance Patient tolerated treatment well             Past Medical History:  Diagnosis Date   Anxiety    Arthritis    Chronic diastolic CHF (congestive heart failure), NYHA class 2 (HCC)    COPD (chronic obstructive pulmonary disease) ([place])    Depression    HTN (hypertension)    Hypercholesterolemia    Hypothyroidism (acquired)    Kidney stones 2015   Myocardial infarction ([place])    x3- pt unsure if MIs were in 1996 or 1999, had stent fixed in 2016 but no MI at that time per pt   Thyrotoxicosis    TIA (transient ischemic attack)    2013    Past Surgical History:  Procedure Laterality Date   BREAST CYST EXCISION     BUBBLE STUDY  06/10/2021   Procedure: BUBBLE STUDY;  Surgeon: Lelon Perla, MD;  Location: [facility];  Service: Cardiovascular;;   CARDIAC CATHETERIZATION N/A 08/24/2015   Procedure: Left Heart Cath and Coronary Angiography;  Surgeon: Jettie Booze, MD;  Location: [facility];  Service: Cardiovascular;  Laterality: N/A;   COLONOSCOPY     CORONARY [place]   and then had procedure to repair in 2016   [place] N/A 06/10/2021   Procedure: LOOP RECORDER INSERTION;  Surgeon: Thompson Grayer, MD;  Location: [facility];  Service: Cardiovascular;  Laterality: N/A;   partial hysterec     TEE WITHOUT CARDIOVERSION N/A 06/10/2021   Procedure:  TRANSESOPHAGEAL ECHOCARDIOGRAM (TEE);  Surgeon: Lelon Perla, MD;  Location: [facility] ENDOSCOPY;  Service: Cardiovascular;  Laterality: N/A;   THYROIDECTOMY      There were no vitals filed for this visit.   Subjective Assessment - 07/05/21 1221     Subjective "When I get "too something" I have a harder time find my words, but when I slow down and pause, I don't."    Currently in Pain? No/denies                   ADULT SLP TREATMENT - 07/05/21 1223       General Information   Behavior/Cognition Alert;Cooperative      Treatment Provided   Treatment provided Cognitive-Linquistic      Cognitive-Linquistic Treatment   Treatment focused on Aphasia    Skilled Treatment Facilitated increased word retrieval and organization during verbal expression task. Pt was asked to generate list of items needed to pack for a beach trip. Pt was able to generate a list of 11 items in a 1 min time frame. With extra time and provided with subcategories, pt is able to independently generate 20+ items. Provided edu to pt and daughter regarding best ways to increase understanding during conversations re: shorter chunks of info, reducing distractions etc.  Assessment / Recommendations / Plan   Plan Continue with current plan of care      Progression Toward Goals   Progression toward goals Progressing toward goals                SLP Short Term Goals - 06/28/21 1139       SLP SHORT TERM GOAL #1   Title Pt will participate in SLUMS screener in order to screen cognition.    Time 4    Period Weeks    Status Achieved    Target Date 07/12/21      SLP SHORT TERM GOAL #2   Title Pt will recall 3 word finding strategies to use in cases of anomia with minA.    Time 4    Period Weeks    Status New    Target Date 07/12/21      SLP SHORT TERM GOAL #3   Title Pt daughter/family will recall 3 communication strategies in order to decrease communication breakdowns with minA.    Time 4     Period Weeks    Status New    Target Date 07/12/21              SLP Long Term Goals - 06/14/21 1705       SLP LONG TERM GOAL #1   Title Pt and  daughter/family will report successful use of word finding strategies to decrease anomia in conversation.    Time 8    Period Weeks    Status New    Target Date 08/09/21      SLP LONG TERM GOAL #2   Title Pt and daughter/family will demonstrate use of communication strategies to reduce frustration and communication breakdowns during conversation.    Time 8    Period Weeks    Status New    Target Date 08/09/21      SLP LONG TERM GOAL #3   Title --    Time --    Period --    Status --    Target Date --              Plan - 07/05/21 1222     Clinical Impression Statement See tx note. Pt was challenged using time pressure today. Will cont to simulate conversations in unfamiliar environments. Cont with current POC.    Speech Therapy Frequency 2x / week    Duration 8 weeks    Treatment/Interventions Compensatory strategies;Cueing hierarchy;Functional tasks;Patient/family education;Environmental controls;Cognitive reorganization;Multimodal communcation approach;Language facilitation;Compensatory techniques;Internal/external aids;SLP instruction and feedback    Potential to Achieve Goals Good    Potential Considerations Ability to learn/carryover information    Consulted and Agree with Plan of Care Patient;Family member/caregiver    Family Member Consulted Daughter - [name]             Patient will benefit from skilled therapeutic intervention in order to improve the following deficits and impairments:   Aphasia    Problem List Patient Active Problem List   Diagnosis Date Noted   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   Cerebral infarction ([place]) 06/08/2021   COPD exacerbation ([place]) 05/16/2021   Acute on chronic respiratory failure with hypoxia ([place]) 05/16/2021   Hypokalemia 05/16/2021   AKI (acute kidney injury) ([place])  05/16/2021   LV (left ventricular) mural thrombus 05/25/2020   Renal infarct ([place]) 05/16/2020   Hypoxia 05/16/2020   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   Proptosis 12/27/2019   Multinodular goiter 03/27/2019   Hypothyroidism 09/26/2018   Sacroiliitis, not  elsewhere classified ([place]) 04/24/2017   Chronic right-sided low back pain with right-sided sciatica 01/12/2017   Chronic bilateral thoracic back pain 01/12/2017   Other spondylosis with radiculopathy, lumbar region 10/19/2016   Depression with anxiety 07/16/2016   Chronic diastolic CHF (congestive heart failure) ([place]) 07/16/2016   Aortic atherosclerosis ([place]) 11/24/2015   Unstable angina ([place]) 08/21/2015   Chest pain    Coronary artery disease    CAD (coronary artery disease), native coronary artery 01/14/2015   Hypertension 01/14/2015   Hypercholesterolemia 01/14/2015   Tobacco abuse 01/14/2015    Verdene Lennert, CCC-SLP 07/05/2021, 12:27 PM  [facility]. [place], [place], [address] Phone: [phone]   Fax:  [phone]   Name: Jocelyn [name] MRN: [id number] Date of Birth: 12/29/1960

## 2021-07-06 ENCOUNTER — Other Ambulatory Visit: Payer: Self-pay | Admitting: *Deleted

## 2021-07-06 NOTE — Patient Outreach (Signed)
[facility]) Care Management  [place]  07/06/2021   Jocelyn [name] 01/29/1961 [id number]    Outgoing call placed to member, successful.  Denies any urgent concerns, encouraged to contact this care manager with questions.    Encounter Medications:  Outpatient Encounter Medications as of 07/06/2021  Medication Sig Note   albuterol (PROVENTIL HFA;VENTOLIN HFA) 108 (90 Base) MCG/ACT inhaler Inhale 2 puffs into the lungs in the morning and at bedtime.    ALPRAZolam (XANAX) 1 MG tablet Take 1 mg by mouth 3 (three) times daily as needed for anxiety.    buPROPion (WELLBUTRIN XL) 300 MG 24 hr tablet Take 300 mg by mouth daily.    clopidogrel (PLAVIX) 75 MG tablet Take 1 tablet (75 mg total) by mouth daily.    cyclobenzaprine (FLEXERIL) 10 MG tablet Take 1 tablet (10 mg total) by mouth 3 (three) times daily as needed for muscle spasms.    furosemide (LASIX) 40 MG tablet Take 40 mg by mouth daily.    gabapentin (NEURONTIN) 300 MG capsule Take 300 mg by mouth daily as needed (For pain).    ibuprofen (ADVIL) 200 MG tablet Take 400 mg by mouth every 6 (six) hours as needed for mild pain.    ipratropium-albuterol (DUONEB) 0.5-2.5 (3) MG/3ML SOLN Take 3 mLs by nebulization every 6 (six) hours as needed. (Patient taking differently: Take 3 mLs by nebulization every 6 (six) hours as needed (shortness of breath, wheezing).)    isosorbide mononitrate (IMDUR) 30 MG 24 hr tablet Take 2 tablets (60 mg total) by mouth daily.    levothyroxine (SYNTHROID) 112 MCG tablet TAKE 1 TABLET BY MOUTH EVERY MORNING    LINZESS 145 MCG CAPS capsule Take 145 mcg by mouth daily.    MELATONIN PO Take 1 tablet by mouth at bedtime as needed (sleep).    metoprolol succinate (TOPROL-XL) 25 MG 24 hr tablet Take 0.5 tablets (12.5 mg total) by mouth daily. 06/09/2021: Last dose 06/07/21   nitroGLYCERIN (NITROSTAT) 0.3 MG SL tablet Place 0.3 mg under the tongue every 5 (five) minutes as needed for chest  pain.    rosuvastatin (CRESTOR) 20 MG tablet Take 1 tablet (20 mg total) by mouth daily.    triamcinolone cream (KENALOG) 0.5 % Apply 1 application topically daily as needed (for sun exposure).    No facility-administered encounter medications on file as of 07/06/2021.    Functional Status:  In your present state of health, do you have any difficulty performing the following activities: 07/01/2021 06/08/2021  Hearing? N -  Vision? N -  Difficulty concentrating or making decisions? N -  Walking or climbing stairs? Y -  Comment Pain -  Dressing or bathing? N -  Doing errands, shopping? N N  Preparing Food and eating ? N -  Using the Toilet? N -  In the past six months, have you accidently leaked urine? N -  Do you have problems with loss of bowel control? N -  Managing your Medications? N -  Managing your Finances? N -  Housekeeping or managing your Housekeeping? N -  Some recent data might be hidden    Fall/Depression Screening: Fall Risk  07/01/2021 06/22/2021  Falls in the past year? 1 0  Number falls in past yr: 0 0  Injury with Fall? 0 0  Follow up Falls evaluation completed;Education provided;Falls prevention discussed -   PHQ 2/9 Scores 06/22/2021  PHQ - 2 Score 4  PHQ- 9 Score 8  Assessment:   Care Plan Care Plan : [facility] Plan of Care (Adult)  Updates made by [name], RN since 07/06/2021 12:00 AM     Problem: Care Coordination needs and assistance with managing chronic medical conditions (COPD & Stroke)   Priority: High     Long-Range Goal: Adequate management of chronic medical conditions (COPD & Stroke)   Start Date: 06/22/2021  Expected End Date: 12/19/2021  This Visit's Progress: On track  Recent Progress: On track  Priority: High  Note:   Current Barriers:  Knowledge Deficits related to plan of care for management of COPD, Tobacco Use, and Stroke Care Coordination needs related to Mental Health Concerns  Has depression post stroke  RNCM Clinical  Goal(s):  Patient will verbalize understanding of plan for management of COPD, Tobacco Use, and Stroke take all medications exactly as prescribed and will call provider for medication related questions attend all scheduled medical appointments: Cardiology, office called to request follow up with assigned provider, Dr. Irish Lack continue to work with RN Care Manager to address care management and care coordination needs related to  COPD, Tobacco Use, and Stroke work with Education officer, museum to address  related to the management of [place] Concerns  related to the management of COPD, Tobacco Use, and Stroke  through collaboration with Consulting civil engineer, provider, and care team.   Interventions: Inter-disciplinary care team collaboration (see longitudinal plan of care) Evaluation of current treatment plan related to  self management and patient's adherence to plan as established by provider  StrokeGoal on track:  Yes. Evaluation of current treatment plan related to Tobacco Use and Stroke , Mental Health Concerns  self-management and patient's adherence to plan as established by provider. Discussed plans with patient for ongoing care management follow up and provided patient with direct contact information for care management team Provided education to patient re: smoking cessation and increased risk for recurrent stroke; Reviewed medications with patient and discussed notifying MD with changes; Social Work referral for depression, elevated PHQ9;  COPD Interventions:   Advised patient to track and manage COPD triggers;  Discussed the importance of adequate rest and management of fatigue with COPD; Screening for signs and symptoms of depression related to chronic disease state;   Patient Goals/Self-Care Activities: Patient will self administer medications as prescribed Patient will attend all scheduled provider appointments Patient will continue to perform ADL's independently Patient will continue  to perform IADL's independently  Follow Up Plan:  Telephone follow up appointment with care management team member scheduled for:  08/03/2021 The patient has been provided with contact information for the care management team and has been advised to call with any health related questions or concerns.    Update 11/29 - Red EMMI alert received today, denies complaints.  State she is improving since starting outpatient speech therapy.   Update 12/6 - Spoke with member and daughter, they both report member is recovering well.  She has appointment with psych for depression/anxiety on 1/2, will continue follow up with CSW until then.  They are concerned about her blood pressure ranging 90-100s/60's, stopped taking Metoprolol.  She remains on Lasix and Imdur.  Conference call placed to cardiology office to follow up, unable to speak with office due to hold time but office will call member directly to schedule follow up appointment for concerns.           Plan:  Follow-up: Patient agrees to Care Plan and Follow-up. Follow-up in 1 month(s).   [name], Therapist, sports,  MSN [place] [phone]

## 2021-07-08 ENCOUNTER — Ambulatory Visit: Payer: Medicare Other | Admitting: Speech Pathology

## 2021-07-12 ENCOUNTER — Ambulatory Visit: Payer: Medicare Other | Admitting: Speech Pathology

## 2021-07-13 DIAGNOSIS — Z79899 Other long term (current) drug therapy: Secondary | ICD-10-CM | POA: Diagnosis not present

## 2021-07-13 DIAGNOSIS — Z5181 Encounter for therapeutic drug level monitoring: Secondary | ICD-10-CM | POA: Diagnosis not present

## 2021-07-14 ENCOUNTER — Ambulatory Visit (INDEPENDENT_AMBULATORY_CARE_PROVIDER_SITE_OTHER): Payer: Medicare Other

## 2021-07-14 DIAGNOSIS — I639 Cerebral infarction, unspecified: Secondary | ICD-10-CM

## 2021-07-15 ENCOUNTER — Other Ambulatory Visit: Payer: Self-pay | Admitting: *Deleted

## 2021-07-15 ENCOUNTER — Ambulatory Visit: Payer: Medicare Other | Admitting: Speech Pathology

## 2021-07-15 LAB — CUP PACEART REMOTE DEVICE CHECK
Date Time Interrogation Session: 20221214152213
Implantable Pulse Generator Implant Date: 20221110

## 2021-07-15 NOTE — Patient Outreach (Cosign Needed)
Care Management Clinical Social Work Note  07/15/2021 Name: Jocelyn [name] MRN: [id number] DOB: [birth year]  Jocelyn [name] is a 60 y.o. year old female who is a primary care patient of Osei-Bonsu, Iona Beard, MD.  The Care Management team was consulted for assistance with chronic disease management and coordination needs.  Engaged with patient by telephone for follow up visit in response to provider referral for social work chronic care management and care coordination services  Consent to Services:  Ms. Satterwhite was given information about Care Management services today including:  Care Management services includes personalized support from designated clinical staff supervised by her physician, including individualized plan of care and coordination with other care providers 24/7 contact phone numbers for assistance for urgent and routine care needs. The patient may stop case management services at any time by phone call to the office staff.  Patient agreed to services and consent obtained.   Assessment: Review of patient past medical history, allergies, medications, and health status, including review of relevant consultants reports was performed today as part of a comprehensive evaluation and provision of chronic care management and care coordination services.  SDOH (Social Determinants of Health) assessments and interventions performed:    Advanced Directives Status: Not addressed in this encounter.  Care Plan  Allergies  Allergen Reactions   Hydrocodone Nausea Only    Has problems with high doses    Outpatient Encounter Medications as of 07/15/2021  Medication Sig Note   albuterol (PROVENTIL HFA;VENTOLIN HFA) 108 (90 Base) MCG/ACT inhaler Inhale 2 puffs into the lungs in the morning and at bedtime.    ALPRAZolam (XANAX) 1 MG tablet Take 1 mg by mouth 3 (three) times daily as needed for anxiety.    buPROPion (WELLBUTRIN XL) 300 MG 24 hr tablet Take 300 mg by mouth daily.     clopidogrel (PLAVIX) 75 MG tablet Take 1 tablet (75 mg total) by mouth daily.    cyclobenzaprine (FLEXERIL) 10 MG tablet Take 1 tablet (10 mg total) by mouth 3 (three) times daily as needed for muscle spasms.    furosemide (LASIX) 40 MG tablet Take 40 mg by mouth daily.    gabapentin (NEURONTIN) 300 MG capsule Take 300 mg by mouth daily as needed (For pain).    ibuprofen (ADVIL) 200 MG tablet Take 400 mg by mouth every 6 (six) hours as needed for mild pain.    ipratropium-albuterol (DUONEB) 0.5-2.5 (3) MG/3ML SOLN Take 3 mLs by nebulization every 6 (six) hours as needed. (Patient taking differently: Take 3 mLs by nebulization every 6 (six) hours as needed (shortness of breath, wheezing).)    isosorbide mononitrate (IMDUR) 30 MG 24 hr tablet Take 2 tablets (60 mg total) by mouth daily.    levothyroxine (SYNTHROID) 112 MCG tablet TAKE 1 TABLET BY MOUTH EVERY MORNING    LINZESS 145 MCG CAPS capsule Take 145 mcg by mouth daily.    MELATONIN PO Take 1 tablet by mouth at bedtime as needed (sleep).    metoprolol succinate (TOPROL-XL) 25 MG 24 hr tablet Take 0.5 tablets (12.5 mg total) by mouth daily. 06/09/2021: Last dose 06/07/21   nitroGLYCERIN (NITROSTAT) 0.3 MG SL tablet Place 0.3 mg under the tongue every 5 (five) minutes as needed for chest pain.    rosuvastatin (CRESTOR) 20 MG tablet Take 1 tablet (20 mg total) by mouth daily.    triamcinolone cream (KENALOG) 0.5 % Apply 1 application topically daily as needed (for sun exposure).    No facility-administered encounter medications  on file as of 07/15/2021.    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   Cerebral infarction ([place]) 06/08/2021   COPD exacerbation ([place]) 05/16/2021   Acute on chronic respiratory failure with hypoxia ([place]) 05/16/2021   Hypokalemia 05/16/2021   AKI (acute kidney injury) ([place]) 05/16/2021   LV (left ventricular) mural thrombus 05/25/2020   Renal infarct ([place]) 05/16/2020    Hypoxia 05/16/2020   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   Proptosis 12/27/2019   Multinodular goiter 03/27/2019   Hypothyroidism 09/26/2018   Sacroiliitis, not elsewhere classified ([place]) 04/24/2017   Chronic right-sided low back pain with right-sided sciatica 01/12/2017   Chronic bilateral thoracic back pain 01/12/2017   Other spondylosis with radiculopathy, lumbar region 10/19/2016   Depression with anxiety 07/16/2016   Chronic diastolic CHF (congestive heart failure) ([place]) 07/16/2016   Aortic atherosclerosis ([place]) 11/24/2015   Unstable angina ([place]) 08/21/2015   Chest pain    Coronary artery disease    CAD (coronary artery disease), native coronary artery 01/14/2015   Hypertension 01/14/2015   Hypercholesterolemia 01/14/2015   Tobacco abuse 01/14/2015    Conditions to be addressed/monitored: Anxiety and Depression.  Limited Social Support, Mental Health Concerns, and Social Isolation.  Care Plan : LCSW Plan of Care  Updates made by Francis Gaines, LCSW since 07/15/2021 12:00 AM     Problem: Reduce and Manage My Symptoms of Anxiety and Depression. Resolved 07/15/2021  Priority: High     Goal: Reduce and Manage My Symptoms of Anxiety and Depression. Completed 07/15/2021  Start Date: 06/30/2021  Expected End Date: 07/15/2021  This Visit's Progress: On track  Recent Progress: On track  Priority: High  Note:   Current Barriers:   Acute Mental Health needs related to recent Stroke, Anxiety and Depression, requires Support, Education, Resources, Referrals, Advocacy, and Care Coordination, in order to meet unmet mental health needs. Clinical Goal(s):  Patient will work with LCSW to reduce and manage symptoms of Anxiety and Depression, until well-established with a community mental health provider.     Patient will increase knowledge and/or ability of:  Coping Skills, Healthy Habits, Self-Management Skills, Stress Reduction, Home Safety and Utilizing [employer] and Resources.    Clinical Interventions:  Verbalization of Feelings encouraged, Emotional Support provided, Cognitive Behavioral Therapy initiated, Support Group Participation encouraged, and Long-Term Counseling and Supportive Services emphasized.      Collaboration with Primary Care Physician, Dr. Iona Beard Osei-Bonsu regarding development and update of comprehensive plan of care as evidenced by provider attestation and co-signature. Inter-disciplinary care team collaboration (see longitudinal plan of care). Patient Goals/Self-Care Activities: LCSW collaboration with Dr. Patriciaann Clan, Psychiatric Physician Assistant and CEO of [facility], [facility] (# [phone]), to confirm acceptance and establishment of services, with regards to providing psychiatric services (psychotherapy and psychotropic medication management).  ~  Successful initial appointment with Dr. Patriciaann Clan on 07/12/2021.  ~  Follow-up appointment with Dr. Patriciaann Clan scheduled on 08/02/2021. Continue to incorporate into daily practice - relaxation techniques, deep breathing exercises and mindfulness meditation strategies. Contact LCSW directly (# [id number]) if you have questions, need assistance, or if additional social work needs are identified in the near future.    Follow-Up:  No follow-up required, per patient.      Nat Christen, BSW, MSW, LCSW  Licensed Education officer, [facility]  Mailing [place] [address], [place], [place] [address] Physical Address-[address], [place], [place]  [place] Main # [phone] Fax # [phone] Cell # [phone]  [email or web address]

## 2021-07-19 ENCOUNTER — Ambulatory Visit: Payer: Medicare Other | Admitting: Speech Pathology

## 2021-07-19 ENCOUNTER — Other Ambulatory Visit: Payer: Self-pay

## 2021-07-19 ENCOUNTER — Encounter: Payer: Self-pay | Admitting: Speech Pathology

## 2021-07-19 ENCOUNTER — Ambulatory Visit
Admission: RE | Admit: 2021-07-19 | Discharge: 2021-07-19 | Disposition: A | Discharge status: Home / Self Care | Payer: Medicare Other | Source: Ambulatory Visit | Attending: Internal Medicine | Admitting: Internal Medicine

## 2021-07-19 DIAGNOSIS — R4701 Aphasia: Secondary | ICD-10-CM | POA: Diagnosis not present

## 2021-07-19 DIAGNOSIS — Z1231 Encounter for screening mammogram for malignant neoplasm of breast: Secondary | ICD-10-CM | POA: Diagnosis not present

## 2021-07-19 NOTE — Therapy (Signed)
[place]. [place], [place], [address] Phone: [phone]   Fax:  [phone]  Speech Language Pathology Treatment  Patient Details  Name: Jocelyn [name] MRN: [id number] Date of Birth: November 05, 1960 Referring Provider (SLP): [name] MD   Encounter Date: 07/19/2021   End of Session - 07/19/21 1736     Visit Number 8    Number of Visits 17    Date for SLP Re-Evaluation 09/14/21    SLP Start Time 52    SLP Stop Time  4174    SLP Time Calculation (min) 45 min    Activity Tolerance Patient tolerated treatment well             Past Medical History:  Diagnosis Date   Anxiety    Arthritis    Chronic diastolic CHF (congestive heart failure), NYHA class 2 (HCC)    COPD (chronic obstructive pulmonary disease) ([place])    Depression    HTN (hypertension)    Hypercholesterolemia    Hypothyroidism (acquired)    Kidney stones 2015   Myocardial infarction ([place])    x3- pt unsure if MIs were in 1996 or 1999, had stent fixed in 2016 but no MI at that time per pt   Thyrotoxicosis    TIA (transient ischemic attack)    2013    Past Surgical History:  Procedure Laterality Date   BREAST CYST EXCISION     BUBBLE STUDY  06/10/2021   Procedure: BUBBLE STUDY;  Surgeon: Lelon Perla, MD;  Location: [facility];  Service: Cardiovascular;;   CARDIAC CATHETERIZATION N/A 08/24/2015   Procedure: Left Heart Cath and Coronary Angiography;  Surgeon: Jettie Booze, MD;  Location: [facility] CV [facility];  Service: Cardiovascular;  Laterality: N/A;   COLONOSCOPY     CORONARY [place]   and then had procedure to repair in 2016   [place] N/A 06/10/2021   Procedure: LOOP RECORDER INSERTION;  Surgeon: Thompson Grayer, MD;  Location: [place] CV [facility];  Service: Cardiovascular;  Laterality: N/A;   partial hysterec     TEE WITHOUT CARDIOVERSION N/A 06/10/2021   Procedure:  TRANSESOPHAGEAL ECHOCARDIOGRAM (TEE);  Surgeon: Lelon Perla, MD;  [facility];  Service: Cardiovascular;  Laterality: N/A;   THYROIDECTOMY      There were no vitals filed for this visit.   Subjective Assessment - 07/19/21 1106     Subjective "Been going good."    Currently in Pain? No/denies                   ADULT SLP TREATMENT - 07/19/21 1732       General Information   Behavior/Cognition Alert;Cooperative      Treatment Provided   Treatment provided Cognitive-Linquistic      Cognitive-Linquistic Treatment   Treatment focused on Aphasia    Skilled Treatment Faciliated increased word retrieval through organization to increase verbal expression. Pt required spv to minA with tasks. Most difficulty noted with more abstract categories. Pt was noted to have difficulty with word retention (~4 words). SLP edu on repetition to assist with retaining information. Repetition increased pt ability to recall information in 50% of tasks.      Assessment / Recommendations / Plan   Plan Continue with current plan of care      Progression Toward Goals   Progression toward goals Progressing toward goals  SLP Short Term Goals - 07/19/21 1736       SLP SHORT TERM GOAL #1   Title Pt will participate in SLUMS screener in order to screen cognition.    Time 4    Period Weeks    Status Achieved    Target Date 07/12/21      SLP SHORT TERM GOAL #2   Title Pt will recall 3 word finding strategies to use in cases of anomia with minA.    Time 4    Period Weeks    Status Achieved    Target Date 07/12/21      SLP SHORT TERM GOAL #3   Title Pt daughter/family will recall 3 communication strategies in order to decrease communication breakdowns with minA.    Time 4    Period Weeks    Status Achieved    Target Date 07/12/21              SLP Long Term Goals - 06/14/21 1705       SLP LONG TERM GOAL #1   Title Pt and  daughter/family will  report successful use of word finding strategies to decrease anomia in conversation.    Time 8    Period Weeks    Status New    Target Date 08/09/21      SLP LONG TERM GOAL #2   Title Pt and daughter/family will demonstrate use of communication strategies to reduce frustration and communication breakdowns during conversation.    Time 8    Period Weeks    Status New    Target Date 08/09/21      SLP LONG TERM GOAL #3   Title --    Time --    Period --    Status --    Target Date --              Plan - 07/19/21 1736     Clinical Impression Statement See tx note. Reports continued difficulty with his/her. Cont with current POC.    Speech Therapy Frequency 2x / week    Duration 8 weeks    Treatment/Interventions Compensatory strategies;Cueing hierarchy;Functional tasks;Patient/family education;Environmental controls;Cognitive reorganization;Multimodal communcation approach;Language facilitation;Compensatory techniques;Internal/external aids;SLP instruction and feedback    Potential to Achieve Goals Good    Potential Considerations Ability to learn/carryover information    Consulted and Agree with Plan of Care Patient;Family member/caregiver    Family Member Consulted Daughter - [name]             Patient will benefit from skilled therapeutic intervention in order to improve the following deficits and impairments:   Aphasia    Problem List Patient Active Problem List   Diagnosis Date Noted   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   Cerebral infarction ([place]) 06/08/2021   COPD exacerbation ([address]) 05/16/2021   Acute on chronic respiratory failure with hypoxia ([place]) 05/16/2021   Hypokalemia 05/16/2021   AKI (acute kidney injury) ([place]) 05/16/2021   LV (left ventricular) mural thrombus 05/25/2020   Renal infarct ([place]) 05/16/2020   Hypoxia 05/16/2020   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   Proptosis 12/27/2019   Multinodular goiter 03/27/2019   Hypothyroidism 09/26/2018    Sacroiliitis, not elsewhere classified ([place]) 04/24/2017   Chronic right-sided low back pain with right-sided sciatica 01/12/2017   Chronic bilateral thoracic back pain 01/12/2017   Other spondylosis with radiculopathy, lumbar region 10/19/2016   Depression with anxiety 07/16/2016   Chronic diastolic CHF (congestive heart failure) ([place]) 07/16/2016   Aortic atherosclerosis ([place]) 11/24/2015  Unstable angina (HCC) 08/21/2015   Chest pain    Coronary artery disease    CAD (coronary artery disease), native coronary artery 01/14/2015   Hypertension 01/14/2015   Hypercholesterolemia 01/14/2015   Tobacco abuse 01/14/2015    Verdene Lennert, CCC-SLP 07/19/2021, 5:37 PM  [place]. [place], [place], [address] Phone: [phone]   Fax:  [phone]   Name: ARISA CONGLETON MRN: [id number] Date of Birth: 02-11-1961

## 2021-07-27 NOTE — Progress Notes (Signed)
Carelink Summary Report / Loop Recorder 

## 2021-07-29 DIAGNOSIS — J441 Chronic obstructive pulmonary disease with (acute) exacerbation: Secondary | ICD-10-CM | POA: Diagnosis not present

## 2021-07-29 DIAGNOSIS — E782 Mixed hyperlipidemia: Secondary | ICD-10-CM | POA: Diagnosis not present

## 2021-07-29 DIAGNOSIS — I6389 Other cerebral infarction: Secondary | ICD-10-CM | POA: Diagnosis not present

## 2021-07-29 DIAGNOSIS — I872 Venous insufficiency (chronic) (peripheral): Secondary | ICD-10-CM | POA: Diagnosis not present

## 2021-07-29 DIAGNOSIS — E1165 Type 2 diabetes mellitus with hyperglycemia: Secondary | ICD-10-CM | POA: Diagnosis not present

## 2021-07-29 DIAGNOSIS — E039 Hypothyroidism, unspecified: Secondary | ICD-10-CM | POA: Diagnosis not present

## 2021-07-29 DIAGNOSIS — M62838 Other muscle spasm: Secondary | ICD-10-CM | POA: Diagnosis not present

## 2021-07-29 DIAGNOSIS — I5032 Chronic diastolic (congestive) heart failure: Secondary | ICD-10-CM | POA: Diagnosis not present

## 2021-07-29 DIAGNOSIS — I10 Essential (primary) hypertension: Secondary | ICD-10-CM | POA: Diagnosis not present

## 2021-07-29 DIAGNOSIS — I2510 Atherosclerotic heart disease of native coronary artery without angina pectoris: Secondary | ICD-10-CM | POA: Diagnosis not present

## 2021-08-03 ENCOUNTER — Other Ambulatory Visit: Payer: Self-pay | Admitting: *Deleted

## 2021-08-03 NOTE — Patient Outreach (Signed)
[facility]) Care Management  [place]  08/03/2021   Jocelyn [name] 1961/02/13 [id number]   Outgoing call placed to member, successful.  Denies any urgent concerns, encouraged to contact this care manager with questions.     Encounter Medications:  Outpatient Encounter Medications as of 08/03/2021  Medication Sig Note   albuterol (PROVENTIL HFA;VENTOLIN HFA) 108 (90 Base) MCG/ACT inhaler Inhale 2 puffs into the lungs in the morning and at bedtime.    ALPRAZolam (XANAX) 1 MG tablet Take 1 mg by mouth 3 (three) times daily as needed for anxiety.    buPROPion (WELLBUTRIN XL) 300 MG 24 hr tablet Take 300 mg by mouth daily.    clopidogrel (PLAVIX) 75 MG tablet Take 1 tablet (75 mg total) by mouth daily.    cyclobenzaprine (FLEXERIL) 10 MG tablet Take 1 tablet (10 mg total) by mouth 3 (three) times daily as needed for muscle spasms.    furosemide (LASIX) 40 MG tablet Take 40 mg by mouth daily.    gabapentin (NEURONTIN) 300 MG capsule Take 300 mg by mouth daily as needed (For pain).    ibuprofen (ADVIL) 200 MG tablet Take 400 mg by mouth every 6 (six) hours as needed for mild pain.    ipratropium-albuterol (DUONEB) 0.5-2.5 (3) MG/3ML SOLN Take 3 mLs by nebulization every 6 (six) hours as needed. (Patient taking differently: Take 3 mLs by nebulization every 6 (six) hours as needed (shortness of breath, wheezing).)    isosorbide mononitrate (IMDUR) 30 MG 24 hr tablet Take 2 tablets (60 mg total) by mouth daily.    levothyroxine (SYNTHROID) 112 MCG tablet TAKE 1 TABLET BY MOUTH EVERY MORNING    LINZESS 145 MCG CAPS capsule Take 145 mcg by mouth daily.    MELATONIN PO Take 1 tablet by mouth at bedtime as needed (sleep).    metoprolol succinate (TOPROL-XL) 25 MG 24 hr tablet Take 0.5 tablets (12.5 mg total) by mouth daily. 06/09/2021: Last dose 06/07/21   nitroGLYCERIN (NITROSTAT) 0.3 MG SL tablet Place 0.3 mg under the tongue every 5 (five) minutes as needed for chest pain.     rosuvastatin (CRESTOR) 20 MG tablet Take 1 tablet (20 mg total) by mouth daily.    triamcinolone cream (KENALOG) 0.5 % Apply 1 application topically daily as needed (for sun exposure).    No facility-administered encounter medications on file as of 08/03/2021.    Functional Status:  In your present state of health, do you have any difficulty performing the following activities: 07/01/2021 06/08/2021  Hearing? N -  Vision? N -  Difficulty concentrating or making decisions? N -  Walking or climbing stairs? Y -  Comment Pain -  Dressing or bathing? N -  Doing errands, shopping? N N  Preparing Food and eating ? N -  Using the Toilet? N -  In the past six months, have you accidently leaked urine? N -  Do you have problems with loss of bowel control? N -  Managing your Medications? N -  Managing your Finances? N -  Housekeeping or managing your Housekeeping? N -  Some recent data might be hidden    Fall/Depression Screening: Fall Risk  07/01/2021 06/22/2021  Falls in the past year? 1 0  Number falls in past yr: 0 0  Injury with Fall? 0 0  Follow up Falls evaluation completed;Education provided;Falls prevention discussed -   PHQ 2/9 Scores 06/22/2021  PHQ - 2 Score 4  PHQ- 9 Score 8  Assessment:   Care Plan Care Plan : [facility] Plan of Care (Adult)  Updates made by [name], RN since 08/03/2021 12:00 AM     Problem: Care Coordination needs and assistance with managing chronic medical conditions (COPD & Stroke)   Priority: High     Long-Range Goal: Adequate management of chronic medical conditions (COPD & Stroke)   Start Date: 06/22/2021  Expected End Date: 12/19/2021  This Visit's Progress: On track  Recent Progress: On track  Priority: High  Note:   Current Barriers:  Knowledge Deficits related to plan of care for management of COPD, Tobacco Use, and Stroke Care Coordination needs related to Mental Health Concerns  Has depression post stroke  RNCM Clinical Goal(s):   Patient will verbalize understanding of plan for management of COPD, Tobacco Use, and Stroke take all medications exactly as prescribed and will call provider for medication related questions attend all scheduled medical appointments: Cardiology, office called to request follow up with assigned provider, Dr. Irish Lack continue to work with RN Care Manager to address care management and care coordination needs related to  COPD, Tobacco Use, and Stroke work with Education officer, museum to address  related to the management of [place] Concerns  related to the management of COPD, Tobacco Use, and Stroke  through collaboration with Consulting civil engineer, provider, and care team.   Interventions: Inter-disciplinary care team collaboration (see longitudinal plan of care) Evaluation of current treatment plan related to  self management and patient's adherence to plan as established by provider  StrokeGoal on track:  Yes. Evaluation of current treatment plan related to Tobacco Use and Stroke , Mental Health Concerns  self-management and patient's adherence to plan as established by provider. Discussed plans with patient for ongoing care management follow up and provided patient with direct contact information for care management team Provided education to patient re: smoking cessation and increased risk for recurrent stroke; Reviewed medications with patient and discussed notifying MD with changes; Social Work referral for depression, elevated PHQ9;  COPD Interventions:   Advised patient to track and manage COPD triggers;  Discussed the importance of adequate rest and management of fatigue with COPD; Screening for signs and symptoms of depression related to chronic disease state;   Patient Goals/Self-Care Activities: Patient will self administer medications as prescribed Patient will attend all scheduled provider appointments Patient will continue to perform ADL's independently Patient will continue to  perform IADL's independently  Follow Up Plan:  Telephone follow up appointment with care management team member scheduled for:  09/01/2021 The patient has been provided with contact information for the care management team and has been advised to call with any health related questions or concerns.    Update 11/29 - Red EMMI alert received today, denies complaints.  State she is improving since starting outpatient speech therapy.   Update 12/6 - Spoke with member and daughter, they both report member is recovering well.  She has appointment with psych for depression/anxiety on 1/2, will continue follow up with CSW until then.  They are concerned about her blood pressure ranging 90-100s/60's, stopped taking Metoprolol.  She remains on Lasix and Imdur.  Conference call placed to cardiology office to follow up, unable to speak with office due to hold time but office will call member directly to schedule follow up appointment for concerns.     Update 1/3 - Member report she is still recovering from stroke.  Attending speech therapy, feels it has helped a lot.  She is still  staying with her daughter but is at her home alone during the day for some independence.  State blood pressure has remained "good" since being off Metoprolol, will follow up with cardiology on 1/17 and neurology on 1/26.  Report her anxiety has decreased some, had appointment with psych yesterday, follow up in 3 months.  After speaking with her PCP, determined that the anxiety may be caused by thyroid levels as she is also having mood swings, night sweats, and hair loss.  PCP in the process of re-assessing TSH levels.   Member inquires about the ongoing use of her oxygen, state she is now only wearing it as needed, oxygen levels are usually greater than 90%.  Encouraged to document throughout the day and discuss with provider as she would like to be completely off of it.  She has decreased her smoking to 1/2-1 cigarette per week.          Plan:  Follow-up: Patient agrees to Care Plan and Follow-up. Follow-up in 1 month(s).  [name], RN, MSN, [place] Manager [phone]

## 2021-08-04 ENCOUNTER — Ambulatory Visit: Payer: Medicare Other | Attending: Internal Medicine | Admitting: Speech Pathology

## 2021-08-04 ENCOUNTER — Encounter: Payer: Self-pay | Admitting: Speech Pathology

## 2021-08-04 ENCOUNTER — Other Ambulatory Visit: Payer: Self-pay

## 2021-08-04 DIAGNOSIS — R4701 Aphasia: Secondary | ICD-10-CM | POA: Diagnosis not present

## 2021-08-04 DIAGNOSIS — R41841 Cognitive communication deficit: Secondary | ICD-10-CM | POA: Insufficient documentation

## 2021-08-05 NOTE — Therapy (Signed)
[place]. [place], [place], [address] Phone: [phone]   Fax:  [phone]  Speech Language Pathology Treatment  Patient Details  Name: Jocelyn [name] MRN: [id number] Date of Birth: 27-Mar-1961 Referring Provider (SLP): [name] MD   Encounter Date: 08/04/2021   End of Session - 08/05/21 1621     Visit Number 9    Number of Visits 17    Date for SLP Re-Evaluation 09/14/21    SLP Start Time 1615    SLP Stop Time  1700    SLP Time Calculation (min) 45 min    Activity Tolerance Patient tolerated treatment well             Past Medical History:  Diagnosis Date   Anxiety    Arthritis    Chronic diastolic CHF (congestive heart failure), NYHA class 2 (HCC)    COPD (chronic obstructive pulmonary disease) ([place])    Depression    HTN (hypertension)    Hypercholesterolemia    Hypothyroidism (acquired)    Kidney stones 2015   Myocardial infarction ([place])    x3- pt unsure if MIs were in 1996 or 1999, had stent fixed in 2016 but no MI at that time per pt   Thyrotoxicosis    TIA (transient ischemic attack)    2013    Past Surgical History:  Procedure Laterality Date   BREAST CYST EXCISION     BUBBLE STUDY  06/10/2021   Procedure: BUBBLE STUDY;  Surgeon: Lelon Perla, MD;  Location: [facility];  Service: Cardiovascular;;   CARDIAC CATHETERIZATION N/A 08/24/2015   Procedure: Left Heart Cath and Coronary Angiography;  Surgeon: Jettie Booze, MD;  Location: [facility] CV LAB;  Service: Cardiovascular;  Laterality: N/A;   COLONOSCOPY     CORONARY [place]   and then had procedure to repair in 2016   [place] N/A 06/10/2021   Procedure: LOOP RECORDER INSERTION;  Surgeon: Thompson Grayer, MD;  Location: [facility] CV LAB;  Service: Cardiovascular;  Laterality: N/A;   partial hysterec     TEE WITHOUT CARDIOVERSION N/A 06/10/2021   Procedure:  TRANSESOPHAGEAL ECHOCARDIOGRAM (TEE);  Surgeon: Lelon Perla, MD;  Location: [facility] ENDOSCOPY;  Service: Cardiovascular;  Laterality: N/A;   THYROIDECTOMY      There were no vitals filed for this visit.   Subjective Assessment - 08/04/21 1619     Subjective "Things have been humorous."    Currently in Pain? No/denies                   ADULT SLP TREATMENT - 08/05/21 1616       General Information   Behavior/Cognition Alert;Cooperative      Treatment Provided   Treatment provided Cognitive-Linquistic      Cognitive-Linquistic Treatment   Treatment focused on Patient/family/caregiver education    Skilled Treatment Pt reports increased word finding problems at home this past month, but also notes increased stress and anxiety. SLP edu on the impact that internal feelings can impact our language and cognition. Throughout session, pt was noted to have 1 significant instance of anomia. SLP provided prompting questions and pt was eventually able to find the word she was looking for. Provided pt with "relaxation after brain injury" handout. Pt discussed that she has had a lot of sadness and anxiety. SLP suggested pt speak to her doctor or pyschologist regarding these increased feelings of  anxiety. Pt denied any feelings of self-harm and just reports that she worries about dying. Pt reported she would reach out to doctor. SLP suggested she write information down to aid in recall of information. Pt in agreement.      Assessment / Recommendations / Plan   Plan Continue with current plan of care      Progression Toward Goals   Progression toward goals Progressing toward goals                SLP Short Term Goals - 07/19/21 1736       SLP SHORT TERM GOAL #1   Title Pt will participate in SLUMS screener in order to screen cognition.    Time 4    Period Weeks    Status Achieved    Target Date 07/12/21      SLP SHORT TERM GOAL #2   Title Pt will recall 3 word finding  strategies to use in cases of anomia with minA.    Time 4    Period Weeks    Status Achieved    Target Date 07/12/21      SLP SHORT TERM GOAL #3   Title Pt daughter/family will recall 3 communication strategies in order to decrease communication breakdowns with minA.    Time 4    Period Weeks    Status Achieved    Target Date 07/12/21              SLP Long Term Goals - 06/14/21 1705       SLP LONG TERM GOAL #1   Title Pt and  daughter/family will report successful use of word finding strategies to decrease anomia in conversation.    Time 8    Period Weeks    Status New    Target Date 08/09/21      SLP LONG TERM GOAL #2   Title Pt and daughter/family will demonstrate use of communication strategies to reduce frustration and communication breakdowns during conversation.    Time 8    Period Weeks    Status New    Target Date 08/09/21      SLP LONG TERM GOAL #3   Title --    Time --    Period --    Status --    Target Date --              Plan - 08/05/21 1622     Clinical Impression Statement See tx note. SUGGESTED PT SPEAK WITH DOC REGARDING INCREASED ANXIETY. Cont with current POC.    Speech Therapy Frequency 2x / week    Duration 8 weeks    Treatment/Interventions Compensatory strategies;Cueing hierarchy;Functional tasks;Patient/family education;Environmental controls;Cognitive reorganization;Multimodal communcation approach;Language facilitation;Compensatory techniques;Internal/external aids;SLP instruction and feedback    Potential to Achieve Goals Good    Potential Considerations Ability to learn/carryover information    Consulted and Agree with Plan of Care Patient;Family member/caregiver    Family Member Consulted Daughter - [name]             Patient will benefit from skilled therapeutic intervention in order to improve the following deficits and impairments:   Aphasia    Problem List Patient Active Problem List   Diagnosis Date Noted    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   Cerebral infarction ([place]) 06/08/2021   COPD exacerbation ([place]) 05/16/2021   Acute on chronic respiratory failure with hypoxia ([place]) 05/16/2021   Hypokalemia 05/16/2021   AKI (acute kidney injury) ([place]) 05/16/2021   LV (left  ventricular) mural thrombus 05/25/2020   Renal infarct ([place]) 05/16/2020   Hypoxia 05/16/2020   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   Proptosis 12/27/2019   Multinodular goiter 03/27/2019   Hypothyroidism 09/26/2018   Sacroiliitis, not elsewhere classified ([place]) 04/24/2017   Chronic right-sided low back pain with right-sided sciatica 01/12/2017   Chronic bilateral thoracic back pain 01/12/2017   Other spondylosis with radiculopathy, lumbar region 10/19/2016   Depression with anxiety 07/16/2016   Chronic diastolic CHF (congestive heart failure) ([place]) 07/16/2016   Aortic atherosclerosis ([place]) 11/24/2015   Unstable angina ([place]) 08/21/2015   Chest pain    Coronary artery disease    CAD (coronary artery disease), native coronary artery 01/14/2015   Hypertension 01/14/2015   Hypercholesterolemia 01/14/2015   Tobacco abuse 01/14/2015    Verdene Lennert, CCC-SLP 08/05/2021, 4:23 PM  [place] [place], [place], [address] Phone: [phone]   Fax:  [phone]   Name: Jocelyn [name] MRN: [id number] Date of Birth: April 18, 1961

## 2021-08-15 NOTE — Progress Notes (Signed)
Cardiology Office Note   Date:  08/17/2021   ID:  Jocelyn [name], DOB [birth year], MRN [id number]  PCP:  Benito Mccreedy, MD    Chief Complaint  Patient presents with   Follow-up   CAD  Wt Readings from Last 3 Encounters:  08/17/21 217 lb (98.4 kg)  06/10/21 179 lb 14.3 oz (81.6 kg)  05/18/21 189 lb 13.1 oz (86.1 kg)       History of Present Illness: Jocelyn [name] is a 61 y.o. female    who has had coronary artery disease. She had a chronically occluded obtuse marginal noted on catheterization from January 2017.    In the past, "She added garlic tabs and honey and this has helped her feel better. She is cutting back on meat. She juices for nutrition. "   Hospitalized in 10/21, records showed: " Presented with abdominal pain.  Imagings showed a splenic/renal infarct suspicion for cardiomegaly stroke.  Echo showed left ventricular thrombus.  Cardiology was consulted and following.  Started on heparin drip and warfarin.    Goal INR should be 2-3.  She does not have any history of clotting disorder, DVT or PE in the past."   She initially thought her pain was from eating something bad.  THen further w/u was done due to left side pain.  Clot was attributed to tobacco use and estrogen use.  She had success in the past with Chantix.     In 10/21, records showed: "She wants to come off of estradiol.  She wants to take honey/garlic supplement combo, but was told to stop it due to warfarin.  She thinks this combo cured her BP, and keeps plaque out of her vessel.      She is going to try patches to stop smoking."   1/22 echo showed EF 45% and resolution of LV thrombus.  "LV thrombus has resolved.  LVEF slightly better.  Is she avoiding tobacco and stopped her estrogen therapy, as these likely played a part in her LV thrombus formation ( although she mentioned she thought it was the COVID vaccine that did it- Moderna)? If so, we can consider stopping Coumadin in the near future. "    At the 1/22 visit: "She has stopped the supplemental estrogen.  She has cut back on her cigarettes and is down to half pack per day.  She would like to come off Coumadin as soon as possible."  She had a stroke in 06/2021.  EP saw the patient: "  left parietal and temporal lobe infarct embolic secondary to likely thromboembolic source in a patient with recent LV thrombus.  However present 2D echo shows no definite thrombus despite patient being off anticoagulation.  she has undergone workup for stroke including echocardiogram and carotid angio.  The patient has been monitored on telemetry which has demonstrated sinus rhythm with no arrhythmias. "  ILR was placed.  Aspirin and Plavix were started.   Past Medical History:  Diagnosis Date   Anxiety    Arthritis    Chronic diastolic CHF (congestive heart failure), NYHA class 2 (HCC)    COPD (chronic obstructive pulmonary disease) (HCC)    Depression    HTN (hypertension)    Hypercholesterolemia    Hypothyroidism (acquired)    Kidney stones 2015   Myocardial infarction ([place])    x3- pt unsure if MIs were in 1996 or 1999, had stent fixed in 2016 but no MI at that time per pt   Thyrotoxicosis  TIA (transient ischemic attack)    2013    Past Surgical History:  Procedure Laterality Date   BREAST CYST EXCISION     BUBBLE STUDY  06/10/2021   Procedure: BUBBLE STUDY;  Surgeon: Lelon Perla, MD;  Location: [place];  Service: Cardiovascular;;   CARDIAC CATHETERIZATION N/A 08/24/2015   Procedure: Left Heart Cath and Coronary Angiography;  Surgeon: Jettie Booze, MD;  Location: [facility];  Service: Cardiovascular;  Laterality: N/A;   COLONOSCOPY     CORONARY [place]   and then had procedure to repair in 2016   [place] N/A 06/10/2021   Procedure: LOOP RECORDER INSERTION;  Surgeon: Thompson Grayer, MD;  Location: [place] [facility];  Service: Cardiovascular;  Laterality: N/A;    partial hysterec     TEE WITHOUT CARDIOVERSION N/A 06/10/2021   Procedure: TRANSESOPHAGEAL ECHOCARDIOGRAM (TEE);  Surgeon: Lelon Perla, MD;  Location: [facility] ENDOSCOPY;  Service: Cardiovascular;  Laterality: N/A;   THYROIDECTOMY       Current Outpatient Medications  Medication Sig Dispense Refill   ALPRAZolam (XANAX) 1 MG tablet Take 1 mg by mouth 3 (three) times daily as needed for anxiety.     aspirin EC 81 MG tablet Take 81 mg by mouth daily. Swallow whole.     buPROPion (WELLBUTRIN XL) 300 MG 24 hr tablet Take 300 mg by mouth daily.     clopidogrel (PLAVIX) 75 MG tablet Take 1 tablet (75 mg total) by mouth daily. 30 tablet 11   cyclobenzaprine (FLEXERIL) 10 MG tablet Take 1 tablet (10 mg total) by mouth 3 (three) times daily as needed for muscle spasms. 21 tablet 0   furosemide (LASIX) 40 MG tablet Take 40 mg by mouth daily.     ibuprofen (ADVIL) 200 MG tablet Take 400 mg by mouth every 6 (six) hours as needed for mild pain.     ipratropium-albuterol (DUONEB) 0.5-2.5 (3) MG/3ML SOLN Take 3 mLs by nebulization every 6 (six) hours as needed. (Patient taking differently: Take 3 mLs by nebulization every 6 (six) hours as needed (shortness of breath, wheezing).) 360 mL 1   isosorbide mononitrate (IMDUR) 30 MG 24 hr tablet Take 2 tablets (60 mg total) by mouth daily. 30 tablet 5   levothyroxine (SYNTHROID) 112 MCG tablet TAKE 1 TABLET BY MOUTH EVERY MORNING 90 tablet 3   LINZESS 145 MCG CAPS capsule Take 145 mcg by mouth daily.     MELATONIN PO Take 1 tablet by mouth at bedtime as needed (sleep).     nitroGLYCERIN (NITROSTAT) 0.3 MG SL tablet Place 0.3 mg under the tongue every 5 (five) minutes as needed for chest pain.  0   rosuvastatin (CRESTOR) 20 MG tablet Take 1 tablet (20 mg total) by mouth daily. 90 tablet 3   SYMBICORT 160-4.5 MCG/ACT inhaler Inhale 2 puffs into the lungs daily.     triamcinolone cream (KENALOG) 0.5 % Apply 1 application topically daily as needed (for sun exposure).      albuterol (PROVENTIL HFA;VENTOLIN HFA) 108 (90 Base) MCG/ACT inhaler Inhale 2 puffs into the lungs in the morning and at bedtime.     No current facility-administered medications for this visit.    Allergies:   Hydrocodone    Social History:  The patient  reports that she has been smoking cigarettes. She has been smoking an average of .5 packs per day. She has been exposed to tobacco smoke. She has  never used smokeless tobacco. She reports that she does not drink alcohol and does not use drugs.   Family History:  The patient's family history includes Cancer in her brother and father; Colon cancer in her father; Diabetes in her maternal grandmother; Gout in her brother and brother; Berenice Primas' disease in her father; Heart disease in her father and mother; Hypertension in her maternal grandmother and mother; Stroke in her brother and mother.    ROS:  Please see the history of present illness.   Otherwise, review of systems are positive for word finding issues- improved.   All other systems are reviewed and negative.    PHYSICAL EXAM: VS:  BP 108/70    Pulse 77    Ht 5\' 4"  (1.626 m)    Wt 217 lb (98.4 kg)    SpO2 98%    BMI 37.25 kg/m  , BMI Body mass index is 37.25 kg/m. GEN: Well nourished, well developed, in no acute distress HEENT: normal Neck: no JVD, carotid bruits, or masses Cardiac: RRR; no murmurs, rubs, or gallops,no edema  Respiratory:  clear to auscultation bilaterally, normal work of breathing GI: soft, nontender, nondistended, + BS MS: no deformity or atrophy Skin: warm and dry, no rash Neuro:  Strength and sensation are intact Psych: euthymic mood, full affect   EKG:   The ekg ordered 06/2021 demonstrates NSR, no ST changes   Recent Labs: 05/16/2021: Magnesium 2.3; TSH 0.174 05/18/2021: B Natriuretic Peptide 79.0 06/08/2021: ALT 30; Hemoglobin 12.6; Platelets 267 06/09/2021: BUN 19; Creatinine, Ser 1.17; Potassium 4.2; Sodium 137   Lipid Panel    Component Value  Date/Time   CHOL 178 06/09/2021 0355   CHOL 286 (H) 10/29/2020 1027   TRIG 98 06/09/2021 0355   HDL 46 06/09/2021 0355   HDL 55 10/29/2020 1027   CHOLHDL 3.9 06/09/2021 0355   VLDL 20 06/09/2021 0355   LDLCALC 112 (H) 06/09/2021 0355   LDLCALC 207 (H) 10/29/2020 1027     Other studies Reviewed: Additional studies/ records that were reviewed today with results demonstrating: Hospital records and labs reviewed.   ASSESSMENT AND PLAN:  CAD: Managed medically.  No angina on medical therapy.  DAPT now post CVA.  Continue aggressive secondary prevention. LV thrombus: Took COumadin for 3 months.  Occurred in the setting of estrogen and tobacco abuse. Resolved on imaging in 06/2021.  ILR in place looking for AFib after her recent CVA.  LV dysfunction: improved.  Borderline BP has limited the titration of meds.  No signs of acute heart failure. HTN: Low-salt diet.  High-fiber foods.  Whole food, plant-based diet.  The current medical regimen is effective;  continue present plan and medications. PreDM: High fiber, whole food, plant based diet. Exercise target noted below.   Hyperlipidemia: LDL 112 at the time of the stroke while off with rosuvastatin.  Now back on 20 mg of rosuvastatin.  LDL target is at least 70.  She has stopped taking this medicine in the past. Aortic atherosclerosis:  On statin therapy.  Aggressive secondary prevention including healthy diet and regular exercise. Tobacco abuse: Smoking 2-3 in a week.  Trying to stop completely.  Morbid obesity: We spoke about the importance of healthy diet and regular exercise. Will check with pharmacy about whether she can take her MSM supplement with the other medications that she is on.   Current medicines are reviewed at length with the patient today.  The patient concerns regarding her medicines were addressed.  The following changes  have been made:  No change  Labs/ tests ordered today include:  No orders of the defined types were  placed in this encounter.   Recommend 150 minutes/week of aerobic exercise Low fat, low carb, high fiber diet recommended  Disposition:   FU in 1 year   Signed, Larae Grooms, MD  08/17/2021 9:46 AM    [facility] [address], [place], [place]  [address] Phone: [phone]; Fax: [phone]

## 2021-08-16 ENCOUNTER — Ambulatory Visit (INDEPENDENT_AMBULATORY_CARE_PROVIDER_SITE_OTHER): Payer: Medicare Other

## 2021-08-16 DIAGNOSIS — I639 Cerebral infarction, unspecified: Secondary | ICD-10-CM | POA: Diagnosis not present

## 2021-08-17 ENCOUNTER — Ambulatory Visit (INDEPENDENT_AMBULATORY_CARE_PROVIDER_SITE_OTHER): Payer: Commercial Managed Care - HMO | Admitting: Interventional Cardiology

## 2021-08-17 ENCOUNTER — Other Ambulatory Visit: Payer: Self-pay

## 2021-08-17 ENCOUNTER — Encounter: Payer: Self-pay | Admitting: Interventional Cardiology

## 2021-08-17 VITALS — BP 108/70 | HR 77 | Ht 64.0 in | Wt 217.0 lb

## 2021-08-17 DIAGNOSIS — I25119 Atherosclerotic heart disease of native coronary artery with unspecified angina pectoris: Secondary | ICD-10-CM | POA: Diagnosis not present

## 2021-08-17 DIAGNOSIS — I639 Cerebral infarction, unspecified: Secondary | ICD-10-CM

## 2021-08-17 DIAGNOSIS — E782 Mixed hyperlipidemia: Secondary | ICD-10-CM

## 2021-08-17 DIAGNOSIS — Z720 Tobacco use: Secondary | ICD-10-CM

## 2021-08-17 DIAGNOSIS — I700 Atherosclerosis of aorta: Secondary | ICD-10-CM | POA: Diagnosis not present

## 2021-08-17 DIAGNOSIS — I10 Essential (primary) hypertension: Secondary | ICD-10-CM

## 2021-08-17 LAB — CUP PACEART REMOTE DEVICE CHECK
Date Time Interrogation Session: 20230116152146
Implantable Pulse Generator Implant Date: 20221110

## 2021-08-17 NOTE — Patient Instructions (Signed)
Medication Instructions:  Your physician recommends that you continue on your current medications as directed. Please refer to the Current Medication list given to you today.  *If you need a refill on your cardiac medications before your next appointment, please call your pharmacy*   Lab Work: none If you have labs (blood work) drawn today and your tests are completely normal, you will receive your results only by: MyChart Message (if you have MyChart) OR A paper copy in the mail If you have any lab test that is abnormal or we need to change your treatment, we will call you to review the results.   Testing/Procedures: none   Follow-Up: At [facility], you and your health needs are our priority.  As part of our continuing mission to provide you with exceptional heart care, we have created designated Provider Care Teams.  These Care Teams include your primary Cardiologist (physician) and Advanced Practice Providers (APPs -  Physician Assistants and Nurse Practitioners) who all work together to provide you with the care you need, when you need it.  We recommend signing up for the patient portal called "MyChart".  Sign up information is provided on this After Visit Summary.  MyChart is used to connect with patients for Virtual Visits (Telemedicine).  Patients are able to view lab/test results, encounter notes, upcoming appointments, etc.  Non-urgent messages can be sent to your provider as well.   To learn more about what you can do with MyChart, go to [email or web address].    Your next appointment:   12 month(s)  The format for your next appointment:   In Person  Provider:   Jayadeep Varanasi, MD     Other Instructions  High-Fiber Eating Plan Fiber, also called dietary fiber, is a type of carbohydrate. It is found foods such as fruits, vegetables, whole grains, and beans. A high-fiber diet can have many health benefits. Your health care provider may recommend a high-fiber diet  to help: Prevent constipation. Fiber can make your bowel movements more regular. Lower your cholesterol. Relieve the following conditions: Inflammation of veins in the anus (hemorrhoids). Inflammation of specific areas of the digestive tract (uncomplicated diverticulosis). A problem of the large intestine, also called the colon, that sometimes causes pain and diarrhea (irritable bowel syndrome, or IBS). Prevent overeating as part of a weight-loss plan. Prevent heart disease, type 2 diabetes, and certain cancers. What are tips for following this plan? Reading food labels  Check the nutrition facts label on food products for the amount of dietary fiber. Choose foods that have 5 grams of fiber or more per serving. The goals for recommended daily fiber intake include: Men (age 50 or younger): 34-38 g. Men (over age 50): 28-34 g. Women (age 50 or younger): 25-28 g. Women (over age 50): 22-25 g. Your daily fiber goal is _____________ g. Shopping Choose whole fruits and vegetables instead of processed forms, such as apple juice or applesauce. Choose a wide variety of high-fiber foods such as avocados, lentils, oats, and kidney beans. Read the nutrition facts label of the foods you choose. Be aware of foods with added fiber. These foods often have high sugar and sodium amounts per serving. Cooking Use whole-grain flour for baking and cooking. Cook with brown rice instead of white rice. Meal planning Start the day with a breakfast that is high in fiber, such as a cereal that contains 5 g of fiber or more per serving. Eat breads and cereals that are made with whole-grain flour instead of   refined flour or white flour. Eat brown rice, bulgur wheat, or millet instead of white rice. Use beans in place of meat in soups, salads, and pasta dishes. Be sure that half of the grains you eat each day are whole grains. General information You can get the recommended daily intake of dietary fiber  by: Eating a variety of fruits, vegetables, grains, nuts, and beans. Taking a fiber supplement if you are not able to take in enough fiber in your diet. It is better to get fiber through food than from a supplement. Gradually increase how much fiber you consume. If you increase your intake of dietary fiber too quickly, you may have bloating, cramping, or gas. Drink plenty of water to help you digest fiber. Choose high-fiber snacks, such as berries, raw vegetables, nuts, and popcorn. What foods should I eat? Fruits Berries. Pears. Apples. Oranges. Avocado. Prunes and raisins. Dried figs. Vegetables Sweet potatoes. Spinach. Kale. Artichokes. Cabbage. Broccoli. Cauliflower. Green peas. Carrots. Squash. Grains Whole-grain breads. Multigrain cereal. Oats and oatmeal. Brown rice. Barley. Bulgur wheat. Millet. Quinoa. Bran muffins. Popcorn. Rye wafer crackers. Meats and other proteins Navy beans, kidney beans, and pinto beans. Soybeans. Split peas. Lentils. Nuts and seeds. Dairy Fiber-fortified yogurt. Beverages Fiber-fortified soy milk. Fiber-fortified orange juice. Other foods Fiber bars. The items listed above may not be a complete list of recommended foods and beverages. Contact a dietitian for more information. What foods should I avoid? Fruits Fruit juice. Cooked, strained fruit. Vegetables Fried potatoes. Canned vegetables. Well-cooked vegetables. Grains White bread. Pasta made with refined flour. White rice. Meats and other proteins Fatty cuts of meat. Fried chicken or fried fish. Dairy Milk. Yogurt. Cream cheese. Sour cream. Fats and oils Butters. Beverages Soft drinks. Other foods Cakes and pastries. The items listed above may not be a complete list of foods and beverages to avoid. Talk with your dietitian about what choices are best for you. Summary Fiber is a type of carbohydrate. It is found in foods such as fruits, vegetables, whole grains, and beans. A high-fiber  diet has many benefits. It can help to prevent constipation, lower blood cholesterol, aid weight loss, and reduce your risk of heart disease, diabetes, and certain cancers. Increase your intake of fiber gradually. Increasing fiber too quickly may cause cramping, bloating, and gas. Drink plenty of water while you increase the amount of fiber you consume. The best sources of fiber include whole fruits and vegetables, whole grains, nuts, seeds, and beans. This information is not intended to replace advice given to you by your health care provider. Make sure you discuss any questions you have with your health care provider. Document Revised: 11/21/2019 Document Reviewed: 11/21/2019 Elsevier Patient Education  2022 Elsevier Inc.   

## 2021-08-18 ENCOUNTER — Telehealth: Payer: Self-pay | Admitting: *Deleted

## 2021-08-18 NOTE — Telephone Encounter (Signed)
-----   Message from Jettie Booze, MD sent at 08/18/2021  3:02 PM EST ----- OK to take MSM supplement  Thanks JV ----- Message ----- From: Leeroy Bock, RPH-CPP Sent: 08/17/2021   4:34 PM EST To: Jettie Booze, MD  Ok to take, frequently combined with glucosamine to help with arthritis. No issues with her other meds.  Thanks, Jinny Blossom ----- Message ----- From: Jettie Booze, MD Sent: 08/17/2021  10:36 AM EST To: Leeroy Bock, RPH-CPP  OK to take MSM , methylsulfonylmethane, for bones 1000mg  daily.

## 2021-08-18 NOTE — Telephone Encounter (Signed)
Patient notified

## 2021-08-26 ENCOUNTER — Ambulatory Visit (INDEPENDENT_AMBULATORY_CARE_PROVIDER_SITE_OTHER): Payer: Medicare Other | Admitting: Neurology

## 2021-08-26 ENCOUNTER — Encounter: Payer: Self-pay | Admitting: Neurology

## 2021-08-26 ENCOUNTER — Other Ambulatory Visit: Payer: Self-pay

## 2021-08-26 VITALS — BP 114/74 | HR 78 | Ht 64.0 in | Wt 216.0 lb

## 2021-08-26 DIAGNOSIS — I639 Cerebral infarction, unspecified: Secondary | ICD-10-CM

## 2021-08-26 DIAGNOSIS — R4701 Aphasia: Secondary | ICD-10-CM | POA: Diagnosis not present

## 2021-08-26 NOTE — Progress Notes (Signed)
[facility] [address] [place]. [place] [address] [phone]       OFFICE FOLLOW-UP NOTE  [name] Date of Birth:  November 15, 1960 Medical Record Number:  [id number]   HPI: [name] is a 61 year old pleasant African-American lady seen today for initial office follow-up visit following hospital admission for stroke in November 2022.  She is accompanied by her daughter and granddaughter.  History is obtained from them and review of electronic medical records and I have personally reviewed available pertinent imaging films and PACS.  She is a 61 year old lady with past medical history of hypertension, hyperlipidemia, hypothyroidism, coronary artery disease, prior LV thrombus was on anticoagulation for a short time, COPD remote smoking quit a month ago, anxiety, depression.  Patient presented on 06/08/2021 with sudden onset of speech difficulties which did not make sense.  She was brought in as a code stroke.  NIH stroke scale was 3 on admission initial head CT was negative for acute abnormalities.  CT angiogram of the brain and neck did not show any large vessel stenosis or occlusion.  MRI scan showed a small left temporoparietal cortical embolic infarct.  Hemoglobin A1c was 6.2.  LDL cholesterol is 1 1 2  mg percent.  Transthoracic echo showed resolution of the previous LV thrombus and no new clot or any other cardiac source of embolism.  Patient had a loop recorder placed and so far paroxysmal A. fib has not yet been found.  Speech symptoms resolved completely.  She states she is done well.  Only when she is tired towards the end of the day occasionally she may struggle with some words have some word finding difficulties.  She has finished outpatient speech therapy.  She has been placed by her cardiologist on aspirin and Plavix dual antiplatelet therapy which is tolerating well without any bruising or bleeding.  She is on Crestor and tolerating it well without muscle aches and  pains.  Her blood pressure is under good control today it is 114/74.  She has no new complaints today.  ROS:   14 system review of systems is positive for speech difficulties, word finding difficulties, bruising all other systems negative  PMH:  Past Medical History:  Diagnosis Date   Anxiety    Arthritis    Chronic diastolic CHF (congestive heart failure), NYHA class 2 (HCC)    COPD (chronic obstructive pulmonary disease) (HCC)    Depression    HTN (hypertension)    Hypercholesterolemia    Hypothyroidism (acquired)    Kidney stones 2015   Myocardial infarction ([place])    x3- pt unsure if MIs were in 1996 or 1999, had stent fixed in 2016 but no MI at that time per pt   Thyrotoxicosis    TIA (transient ischemic attack)    2013    Social History:  Social History   Socioeconomic History   Marital status: Legally Separated    Spouse name: Not on file   Number of children: 2   Years of education: 12   Highest education level: 12th grade  Occupational History   Occupation: [employer]  Tobacco Use   Smoking status: Light Smoker    Packs/day: 0.50    Types: Cigarettes    Passive exposure: Current   Smokeless tobacco: Never  Vaping Use   Vaping Use: Never used  Substance and Sexual Activity   Alcohol use: No    Alcohol/week: 0.0 standard drinks   Drug use: No   Sexual activity: Not Currently  Other Topics Concern   Not on file  Social History Narrative   Right-handed.   Caffeine use: 5 cups per day.   Living with her daughter right now.   Social Determinants of Health   Financial Resource Strain: Low Risk    Difficulty of Paying Living Expenses: Not hard at all  Food Insecurity: No Food Insecurity   Worried About Charity fundraiser in the Last Year: Never true   [place] in the Last Year: Never true  Transportation Needs: No Transportation Needs   Lack of Transportation (Medical): No   Lack of Transportation (Non-Medical): No  Physical Activity: Inactive    Days of Exercise per Week: 0 days   Minutes of Exercise per Session: 0 min  Stress: Stress Concern Present   Feeling of Stress : Rather much  Social Connections: Socially Isolated   Frequency of Communication with Friends and Family: More than three times a week   Frequency of Social Gatherings with Friends and Family: More than three times a week   Attends Religious Services: Never   Marine scientist or Organizations: No   Attends Music therapist: Never   Marital Status: Separated  Intimate Partner Violence: Not At Risk   Fear of Current or Ex-Partner: No   Emotionally Abused: No   Physically Abused: No   Sexually Abused: No    Medications:   Current Outpatient Medications on File Prior to Visit  Medication Sig Dispense Refill   ALPRAZolam (XANAX) 1 MG tablet Take 1 mg by mouth 3 (three) times daily as needed for anxiety.     aspirin EC 81 MG tablet Take 81 mg by mouth daily. Swallow whole.     buPROPion (WELLBUTRIN XL) 300 MG 24 hr tablet Take 300 mg by mouth daily.     clopidogrel (PLAVIX) 75 MG tablet Take 1 tablet (75 mg total) by mouth daily. 30 tablet 11   cyclobenzaprine (FLEXERIL) 10 MG tablet Take 1 tablet (10 mg total) by mouth 3 (three) times daily as needed for muscle spasms. 21 tablet 0   furosemide (LASIX) 40 MG tablet Take 40 mg by mouth daily.     ibuprofen (ADVIL) 200 MG tablet Take 400 mg by mouth every 6 (six) hours as needed for mild pain.     ipratropium-albuterol (DUONEB) 0.5-2.5 (3) MG/3ML SOLN Take 3 mLs by nebulization every 6 (six) hours as needed. (Patient taking differently: Take 3 mLs by nebulization every 6 (six) hours as needed (shortness of breath, wheezing).) 360 mL 1   isosorbide mononitrate (IMDUR) 30 MG 24 hr tablet Take 2 tablets (60 mg total) by mouth daily. 30 tablet 5   levothyroxine (SYNTHROID) 112 MCG tablet TAKE 1 TABLET BY MOUTH EVERY MORNING 90 tablet 3   LINZESS 145 MCG CAPS capsule Take 145 mcg by mouth daily.      MELATONIN PO Take 1 tablet by mouth at bedtime as needed (sleep).     Methylsulfonylmethane (MSM) 1000 MG TABS Take 1 tablet by mouth daily.     nitroGLYCERIN (NITROSTAT) 0.3 MG SL tablet Place 0.3 mg under the tongue every 5 (five) minutes as needed for chest pain.  0   rosuvastatin (CRESTOR) 20 MG tablet Take 1 tablet (20 mg total) by mouth daily. 90 tablet 3   SYMBICORT 160-4.5 MCG/ACT inhaler Inhale 2 puffs into the lungs daily.     triamcinolone cream (KENALOG) 0.5 % Apply 1 application topically daily as needed (for sun exposure).  albuterol (PROVENTIL HFA;VENTOLIN HFA) 108 (90 Base) MCG/ACT inhaler Inhale 2 puffs into the lungs in the morning and at bedtime.     No current facility-administered medications on file prior to visit.    Allergies:   Allergies  Allergen Reactions   Hydrocodone Itching    Has problems with high doses    Physical Exam General: Mildly obese middle-aged African-American lady seated, in no evident distress Head: head normocephalic and atraumatic.  Neck: supple with no carotid or supraclavicular bruits Cardiovascular: regular rate and rhythm, no murmurs Musculoskeletal: no deformity Skin:  no rash/petichiae Vascular:  Normal pulses all extremities Vitals:   08/26/21 1048  BP: 114/74  Pulse: 78   Neurologic Exam Mental Status: Awake and fully alert. Oriented to place and time. Recent and remote memory intact. Attention span, concentration and fund of knowledge appropriate. Mood and affect appropriate.  Cranial Nerves: Fundoscopic exam reveals sharp disc margins. Pupils equal, briskly reactive to light. Extraocular movements full without nystagmus. Visual fields full to confrontation. Hearing intact. Facial sensation intact. Face, tongue, palate moves normally and symmetrically.  Motor: Normal bulk and tone. Normal strength in all tested extremity muscles. Sensory.: intact to touch ,pinprick .position and vibratory sensation.  Coordination: Rapid  alternating movements normal in all extremities. Finger-to-nose and heel-to-shin performed accurately bilaterally. Gait and Station: Arises from chair without difficulty. Stance is normal. Gait demonstrates normal stride length and balance . Able to heel, toe and tandem walk with mild  difficulty.  Reflexes: 1+ and symmetric. Toes downgoing.   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  She is doing extremely well with only occasional word finding difficulties.  Vascular risk factors of cardiomyopathy, hypertension and hyperlipidemia     PLAN: I had a long d/w patient and her daughter about her recent cryptogenic stroke, risk for recurrent stroke/TIAs, personally independently reviewed imaging studies and stroke evaluation results and answered questions.Continue aspirin 81 mg daily and clopidogrel 75 mg daily  for secondary stroke prevention given her cardiomyopathy and maintain strict control of hypertension with blood pressure goal below 130/90, diabetes with hemoglobin A1c goal below 6.5% and lipids with LDL cholesterol goal below 70 mg/dL. I also advised the patient to eat a healthy diet with plenty of whole grains, cereals, fruits and vegetables, exercise regularly and maintain ideal body weight Followup in the future with me in 6 months or call earlier if necessary. Greater than 50% of time during this 35 minute visit was spent on counseling,explanation of diagnosis, planning of further management, discussion with patient and family and coordination of care Antony Contras, MD Note: This document was prepared with digital dictation and possible smart phrase technology. Any transcriptional errors that result from this process are unintentional

## 2021-08-26 NOTE — Patient Instructions (Signed)
I had a long d/w patient and her daughter about her recent cryptogenic stroke, risk for recurrent stroke/TIAs, personally independently reviewed imaging studies and stroke evaluation results and answered questions.Continue aspirin 81 mg daily and clopidogrel 75 mg daily  for secondary stroke prevention given her cardiomyopathy and maintain strict control of hypertension with blood pressure goal below 130/90, diabetes with hemoglobin A1c goal below 6.5% and lipids with LDL cholesterol goal below 70 mg/dL. I also advised the patient to eat a healthy diet with plenty of whole grains, cereals, fruits and vegetables, exercise regularly and maintain ideal body weight Followup in the future with me in 6 months or call earlier if necessary. Stroke Prevention Some medical conditions and behaviors can lead to a higher chance of having a stroke. You can help prevent a stroke by eating healthy, exercising, not smoking, and managing any medical conditions you have. Stroke is a leading cause of functional impairment. Primary prevention is particularly important because a majority of strokes are first-time events. Stroke changes the lives of not only those who experience a stroke but also their family and other caregivers. How can this condition affect me? A stroke is a medical emergency and should be treated right away. A stroke can lead to brain damage and can sometimes be life-threatening. If a person gets medical treatment right away, there is a better chance of surviving and recovering from a stroke. What can increase my risk? The following medical conditions may increase your risk of a stroke: Cardiovascular disease. High blood pressure (hypertension). Diabetes. High cholesterol. Sickle cell disease. Blood clotting disorders (hypercoagulable state). Obesity. Sleep disorders (obstructive sleep apnea). Other risk factors include: Being older than age 47. Having a history of blood clots, stroke, or mini-stroke  (transient ischemic attack, TIA). Genetic factors, such as race, ethnicity, or a family history of stroke. Smoking cigarettes or using other tobacco products. Taking birth control pills, especially if you also use tobacco. Heavy use of alcohol or drugs, especially cocaine and methamphetamine. Physical inactivity. What actions can I take to prevent this? Manage your health conditions High cholesterol levels. Eating a healthy diet is important for preventing high cholesterol. If cholesterol cannot be managed through diet alone, you may need to take medicines. Take any prescribed medicines to control your cholesterol as told by your health care provider. Hypertension. To reduce your risk of stroke, try to keep your blood pressure below 130/80. Eating a healthy diet and exercising regularly are important for controlling blood pressure. If these steps are not enough to manage your blood pressure, you may need to take medicines. Take any prescribed medicines to control hypertension as told by your health care provider. Ask your health care provider if you should monitor your blood pressure at home. Have your blood pressure checked every year, even if your blood pressure is normal. Blood pressure increases with age and some medical conditions. Diabetes. Eating a healthy diet and exercising regularly are important parts of managing your blood sugar (glucose). If your blood sugar cannot be managed through diet and exercise, you may need to take medicines. Take any prescribed medicines to control your diabetes as told by your health care provider. Get evaluated for obstructive sleep apnea. Talk to your health care provider about getting a sleep evaluation if you snore a lot or have excessive sleepiness. Make sure that any other medical conditions you have, such as atrial fibrillation or atherosclerosis, are managed. Nutrition Follow instructions from your health care provider about what to eat or drink  to help manage your health condition. These instructions may include: Reducing your daily calorie intake. Limiting how much salt (sodium) you use to 1,500 milligrams (mg) each day. Using only healthy fats for cooking, such as olive oil, canola oil, or sunflower oil. Eating healthy foods. You can do this by: Choosing foods that are high in fiber, such as whole grains, and fresh fruits and vegetables. Eating at least 5 servings of fruits and vegetables a day. Try to fill one-half of your plate with fruits and vegetables at each meal. Choosing lean protein foods, such as lean cuts of meat, poultry without skin, fish, tofu, beans, and nuts. Eating low-fat dairy products. Avoiding foods that are high in sodium. This can help lower blood pressure. Avoiding foods that have saturated fat, trans fat, and cholesterol. This can help prevent high cholesterol. Avoiding processed and prepared foods. Counting your daily carbohydrate intake.  Lifestyle If you drink alcohol: Limit how much you have to: 0-1 drink a day for women who are not pregnant. 0-2 drinks a day for men. Know how much alcohol is in your drink. In the [place] one drink equals one 12 oz bottle of beer (376mL), one 5 oz glass of wine (125mL), or one 1 oz glass of hard liquor (68mL). Do not use any products that contain nicotine or tobacco. These products include cigarettes, chewing tobacco, and vaping devices, such as e-cigarettes. If you need help quitting, ask your health care provider. Avoid secondhand smoke. Do not use drugs. Activity  Try to stay at a healthy weight. Get at least 30 minutes of exercise on most days, such as: Fast walking. Biking. Swimming. Medicines Take over-the-counter and prescription medicines only as told by your health care provider. Aspirin or blood thinners (antiplatelets or anticoagulants) may be recommended to reduce your risk of forming blood clots that can lead to stroke. Avoid taking birth control  pills. Talk to your health care provider about the risks of taking birth control pills if: You are over 56 years old. You smoke. You get very bad headaches. You have had a blood clot. Where to find more information American Stroke Association: www.strokeassociation.org Get help right away if: You or a loved one has any symptoms of a stroke. "BE FAST" is an easy way to remember the main warning signs of a stroke: B - Balance. Signs are dizziness, sudden trouble walking, or loss of balance. E - Eyes. Signs are trouble seeing or a sudden change in vision. F - Face. Signs are sudden weakness or numbness of the face, or the face or eyelid drooping on one side. A - Arms. Signs are weakness or numbness in an arm. This happens suddenly and usually on one side of the body. S - Speech. Signs are sudden trouble speaking, slurred speech, or trouble understanding what people say. T - Time. Time to call emergency services. Write down what time symptoms started. You or a loved one has other signs of a stroke, such as: A sudden, severe headache with no known cause. Nausea or vomiting. Seizure. These symptoms may represent a serious problem that is an emergency. Do not wait to see if the symptoms will go away. Get medical help right away. Call your local emergency services (911 in the [place] Do not drive yourself to the hospital. Summary You can help to prevent a stroke by eating healthy, exercising, not smoking, limiting alcohol intake, and managing any medical conditions you may have. Do not use any products that contain nicotine or  tobacco. These include cigarettes, chewing tobacco, and vaping devices, such as e-cigarettes. If you need help quitting, ask your health care provider. Remember "BE FAST" for warning signs of a stroke. Get help right away if you or a loved one has any of these signs. This information is not intended to replace advice given to you by your health care provider. Make sure you  discuss any questions you have with your health care provider. Document Revised: 02/17/2020 Document Reviewed: 02/17/2020 Elsevier Patient Education  [place].

## 2021-08-30 ENCOUNTER — Other Ambulatory Visit: Payer: Self-pay

## 2021-08-30 ENCOUNTER — Encounter: Payer: Self-pay | Admitting: Speech Pathology

## 2021-08-30 ENCOUNTER — Ambulatory Visit: Payer: Medicare Other | Admitting: Speech Pathology

## 2021-08-30 DIAGNOSIS — R4701 Aphasia: Secondary | ICD-10-CM

## 2021-08-30 DIAGNOSIS — R41841 Cognitive communication deficit: Secondary | ICD-10-CM

## 2021-08-30 NOTE — Progress Notes (Signed)
Carelink Summary Report / Loop Recorder 

## 2021-08-30 NOTE — Therapy (Signed)
[name] [place], [place], [address] Phone: [phone]   Fax:  [phone]  Speech Language Pathology Treatment  Patient Details  Name: Jocelyn [name] MRN: [id number] Date of Birth: Jul 21, 1961 Referring Provider (SLP): [name] MD   Encounter Date: 08/30/2021   End of Session - 08/30/21 1708     Visit Number 10    Number of Visits 17    Date for SLP Re-Evaluation 09/14/21    SLP Start Time 37    SLP Stop Time  1448    SLP Time Calculation (min) 38 min    Activity Tolerance Patient tolerated treatment well             Past Medical History:  Diagnosis Date   Anxiety    Arthritis    Chronic diastolic CHF (congestive heart failure), NYHA class 2 (HCC)    COPD (chronic obstructive pulmonary disease) ([place])    Depression    HTN (hypertension)    Hypercholesterolemia    Hypothyroidism (acquired)    Kidney stones 2015   Myocardial infarction ([place])    x3- pt unsure if MIs were in 1996 or 1999, had stent fixed in 2016 but no MI at that time per pt   Thyrotoxicosis    TIA (transient ischemic attack)    2013    Past Surgical History:  Procedure Laterality Date   BREAST CYST EXCISION     BUBBLE STUDY  06/10/2021   Procedure: BUBBLE STUDY;  Surgeon: Lelon Perla, MD;  Location: [facility];  Service: Cardiovascular;;   CARDIAC CATHETERIZATION N/A 08/24/2015   Procedure: Left Heart Cath and Coronary Angiography;  Surgeon: Jettie Booze, MD;  Location: [facility];  Service: Cardiovascular;  Laterality: N/A;   COLONOSCOPY     CORONARY [place]   and then had procedure to repair in 2016   [place] N/A 06/10/2021   Procedure: LOOP RECORDER INSERTION;  Surgeon: Thompson Grayer, MD;  Location: [facility];  Service: Cardiovascular;  Laterality: N/A;   partial hysterec     TEE WITHOUT CARDIOVERSION N/A 06/10/2021   Procedure:  TRANSESOPHAGEAL ECHOCARDIOGRAM (TEE);  Surgeon: Lelon Perla, MD;  Location: [facility];  Service: Cardiovascular;  Laterality: N/A;   THYROIDECTOMY      There were no vitals filed for this visit.   Subjective Assessment - 08/30/21 1412     Subjective "I am feeling good."    Currently in Pain? No/denies                   ADULT SLP TREATMENT - 08/30/21 1705       General Information   Behavior/Cognition Alert;Cooperative      Treatment Provided   Treatment provided Cognitive-Linquistic      Cognitive-Linquistic Treatment   Treatment focused on Cognition;Patient/family/caregiver education    Skilled Treatment Pt reports she has continued to improve and has had decreased instances of anomia in conversation; however, notes that she will have difficulty when she is more tiredor talking fast.. SLP encouraged pt to use strategies in these cases (slowing down, using word finding strategies). SLP edu pt and daughter regarding concerns with word finding. SLP edu on memory strategies this session. Pt reports that she uses calendars and makes lists.      Assessment / Recommendations / Plan   Plan Continue with current plan of care  Progression Toward Goals   Progression toward goals Progressing toward goals                SLP Short Term Goals - 07/19/21 1736       SLP SHORT TERM GOAL #1   Title Pt will participate in SLUMS screener in order to screen cognition.    Time 4    Period Weeks    Status Achieved    Target Date 07/12/21      SLP SHORT TERM GOAL #2   Title Pt will recall 3 word finding strategies to use in cases of anomia with minA.    Time 4    Period Weeks    Status Achieved    Target Date 07/12/21      SLP SHORT TERM GOAL #3   Title Pt daughter/family will recall 3 communication strategies in order to decrease communication breakdowns with minA.    Time 4    Period Weeks    Status Achieved    Target Date 07/12/21               SLP Long Term Goals - 08/30/21 1709       SLP LONG TERM GOAL #1   Title Pt and  daughter/family will report successful use of word finding strategies to decrease anomia in conversation.    Time 8    Period Weeks    Status New      SLP LONG TERM GOAL #2   Title Pt and daughter/family will demonstrate use of communication strategies to reduce frustration and communication breakdowns during conversation.    Time 8    Period Weeks    Status New      SLP LONG TERM GOAL #3   Title Pt will recall 3 memory strategies to increase recall of important information during conversationst at home independently.    Time 2    Period Weeks    Status New    Target Date 09/13/21              Plan - 08/30/21 1708     Clinical Impression Statement See tx note. Reports she has improved, but doesn't quite feel back to her "normal self". Cont with current POC.    Speech Therapy Frequency 2x / week    Duration 8 weeks    Treatment/Interventions Compensatory strategies;Cueing hierarchy;Functional tasks;Patient/family education;Environmental controls;Cognitive reorganization;Multimodal communcation approach;Language facilitation;Compensatory techniques;Internal/external aids;SLP instruction and feedback    Potential to Achieve Goals Good    Potential Considerations Ability to learn/carryover information    Consulted and Agree with Plan of Care Patient;Family member/caregiver    Family Member Consulted Daughter - [name]             Patient will benefit from skilled therapeutic intervention in order to improve the following deficits and impairments:   Cognitive communication deficit  Aphasia    Problem List Patient Active Problem List   Diagnosis Date Noted   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   Cerebral infarction ([place]) 06/08/2021   COPD exacerbation ([place]) 05/16/2021   Acute on chronic respiratory failure with hypoxia ([place]) 05/16/2021   Hypokalemia 05/16/2021   AKI (acute kidney  injury) ([place]) 05/16/2021   LV (left ventricular) mural thrombus 05/25/2020   Renal infarct ([place]) 05/16/2020   Hypoxia 05/16/2020   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   Proptosis 12/27/2019   Multinodular goiter 03/27/2019   Hypothyroidism 09/26/2018   Sacroiliitis, not elsewhere classified ([place]) 04/24/2017   Chronic right-sided low back pain with right-sided  sciatica 01/12/2017   Chronic bilateral thoracic back pain 01/12/2017   Other spondylosis with radiculopathy, lumbar region 10/19/2016   Depression with anxiety 07/16/2016   Chronic diastolic CHF (congestive heart failure) ([place]) 07/16/2016   Aortic atherosclerosis ([place]) 11/24/2015   Unstable angina ([place]) 08/21/2015   Chest pain    Coronary artery disease    CAD (coronary artery disease), native coronary artery 01/14/2015   Hypertension 01/14/2015   Hypercholesterolemia 01/14/2015   Tobacco abuse 01/14/2015   Speech Therapy Progress Note  Dates of Reporting Period: 06/14/21 to present  Subjective Statement: "I am improving."  Objective: See tx notes.   Goal Update: See goals. Updated to include memory.   Plan: Cont with current POC; targeting memory.   Reason Skilled Services are Required: SLP rec skilled speech services to address aphasia and cognitive communication impairment to increase confidence and maximize independence.    Verdene Lennert, [place] 08/30/2021, 5:10 PM  [facility]. [place], [place], [address] Phone: [phone]   Fax:  [phone]   Name: Jocelyn [name] MRN: [id number] Date of Birth: April 13, 1961

## 2021-09-01 ENCOUNTER — Other Ambulatory Visit: Payer: Self-pay | Admitting: *Deleted

## 2021-09-01 NOTE — Patient Outreach (Signed)
[facility]) Care Management Telephonic RN Care Manager Note   09/01/2021 Name:  Jocelyn [name] MRN:  [id number] DOB:  September 26, 1960  Summary: Jocelyn [name] call placed to member, successful. Denies any urgent [name], encouraged to contact this care manager with questions.    Subjective: Jocelyn [name] is an 61 y.o. year old female who is a primary patient of Jocelyn [name], Jocelyn Beard, MD. The care management team was consulted for assistance with care management and/or care coordination needs.    Telephonic RN Care Manager completed Telephone Visit today.  Objective:   Medications Reviewed Today     Reviewed by Jocelyn [name], CCC-SLP (Speech and Language Pathologist) on 08/30/21 at 1412  Med List Status: <None>   Medication Order Taking? Sig Documenting Provider Last Dose Status Informant  albuterol (PROVENTIL HFA;VENTOLIN HFA) 108 (90 Base) MCG/ACT inhaler [id number] No Inhale 2 puffs into the lungs in the morning and at bedtime. [provider] Past Week Expired 06/09/21 2359 Self           Med Note Gar Ponto   Fri May 29, 2020 11:14 AM)    ALPRAZolam Jocelyn [name]) 1 MG tablet [id number] No Take 1 mg by mouth 3 (three) times daily as needed for anxiety. [provider] Taking Active Self           Med Note Gar Ponto   Fri May 29, 2020 11:14 AM)    aspirin EC 81 MG tablet [id number] No Take 81 mg by mouth daily. Swallow whole. [provider] Taking Active   buPROPion (WELLBUTRIN XL) 300 MG 24 hr tablet [id number] No Take 300 mg by mouth daily. [provider] Taking Active Self  clopidogrel (PLAVIX) 75 MG tablet [id number] No Take 1 tablet (75 mg total) by mouth daily. [name], MD Taking Active   cyclobenzaprine (FLEXERIL) 10 MG tablet [id number] No Take 1 tablet (10 mg total) by mouth 3 (three) times daily as needed for muscle spasms. Jocelyn Slick, NP Taking Active Self  furosemide (LASIX) 40 MG tablet [id number] No Take  40 mg by mouth daily. [provider] Taking Active Self  ibuprofen (ADVIL) 200 MG tablet [id number] No Take 400 mg by mouth every 6 (six) hours as needed for mild pain. [provider] Taking Active Self  ipratropium-albuterol (DUONEB) 0.5-2.5 (3) MG/3ML SOLN [id number] No Take 3 mLs by nebulization every 6 (six) hours as needed.  Patient taking differently: Take 3 mLs by nebulization every 6 (six) hours as needed (shortness of breath, wheezing).   Jocelyn, [name], MD Taking Active Self  isosorbide mononitrate (IMDUR) 30 MG 24 hr tablet [id number] No Take 2 tablets (60 mg total) by mouth daily. Jocelyn Odea, MD Taking Active Self           Med Note Gar Ponto   Fri May 29, 2020 11:14 AM)    levothyroxine (SYNTHROID) 112 MCG tablet [id number] No TAKE 1 TABLET BY MOUTH EVERY MORNING Jocelyn Shin, MD Taking Active Self  LINZESS 145 MCG CAPS capsule [id number] No Take 145 mcg by mouth daily. [provider] Taking Active Self  MELATONIN PO [id number] No Take 1 tablet by mouth at bedtime as needed (sleep). [provider] Taking Active Self  Methylsulfonylmethane (MSM) 1000 MG TABS [id number] No Take 1 tablet by mouth daily. [provider] Taking Active   nitroGLYCERIN (NITROSTAT) 0.3 MG SL tablet [id number] No Place 0.3 mg under the tongue every 5 (five) minutes as needed for chest pain.  [provider] Taking Active Self           Med Note Belva Agee   Sat Jul 16, 2016  5:59 PM)    rosuvastatin (CRESTOR) 20 MG tablet [id number] No Take 1 tablet (20 mg total) by mouth daily. [name], MD Taking Active   SYMBICORT 160-4.5 MCG/ACT inhaler [id number] No Inhale 2 puffs into the lungs daily. [provider] Taking Active   triamcinolone cream (KENALOG) 0.5 % [id number] No Apply 1 application topically daily as needed (for sun exposure). [provider] Taking Active Self             SDOH:  (Social  Determinants of Health) assessments and interventions performed:     Care Plan  Review of patient past medical history, allergies, medications, health status, including review of consultants reports, laboratory and other test data, was performed as part of comprehensive evaluation for care management services.   Care Plan : [facility] Plan of Care (Adult)  Updates made by Jocelyn David, RN since 09/01/2021 12:00 AM     Problem: Care Coordination needs and assistance with managing chronic medical conditions (COPD & Stroke)   Priority: High     Long-Range Goal: Adequate management of chronic medical conditions (COPD & Stroke)   Start Date: 06/22/2021  Expected End Date: 12/19/2021  This Visit's Progress: On track  Recent Progress: On track  Priority: High  Note:   Current Barriers:  Knowledge Deficits related to plan of care for management of COPD, Tobacco Use, and Stroke Care Coordination needs related to Mental Health [name]  Has depression post stroke  RNCM Clinical Goal(s):  Patient will verbalize understanding of plan for management of COPD, Tobacco Use, and Stroke take all medications exactly as prescribed and will call provider for medication related questions attend all scheduled medical appointments: Cardiology, office called to request follow up with assigned provider, Dr. Irish Lack continue to work with RN Care Manager to address care management and care coordination needs related to  COPD, Tobacco Use, and Stroke work with Education officer, museum to address  related to the management of Jocelyn [name]  related to the management of COPD, Tobacco Use, and Stroke  through collaboration with Consulting civil engineer, provider, and care team.   Interventions: Inter-disciplinary care team collaboration (see longitudinal plan of care) Evaluation of current treatment plan related to  self management and patient's adherence to plan as established by provider  StrokeGoal on track:  Yes. Evaluation  of current treatment plan related to Tobacco Use and Stroke , Mental Health [name]  self-management and patient's adherence to plan as established by provider. Discussed plans with patient for ongoing care management follow up and provided patient with direct contact information for care management team Provided education to patient re: smoking cessation and increased risk for recurrent stroke; Reviewed medications with patient and discussed notifying MD with changes; Social Work referral for depression, elevated PHQ9;  COPD Interventions:   Advised patient to track and manage COPD triggers;  Discussed the importance of adequate rest and management of fatigue with COPD; Screening for signs and symptoms of depression related to chronic disease state;   Patient Goals/Self-Care Activities: Patient will self administer medications as prescribed Patient will attend all scheduled provider appointments Patient will continue to perform ADL's independently Patient will continue to perform IADL's independently  Follow Up Plan:  Telephone follow up appointment with care management team member scheduled for:  09/29/2021 The patient has been provided with contact information for  the care management team and has been advised to call with any health related questions or [name].    Update 11/29 - Red EMMI alert received today, denies complaints.  State she is improving since starting outpatient speech therapy.   Update 12/6 - Spoke with member and daughter, they both report member is recovering well.  She has appointment with psych for depression/anxiety on 1/2, will continue follow up with CSW until then.  They are concerned about her blood pressure ranging 90-100s/60's, stopped taking Metoprolol.  She remains on Lasix and Imdur.  Conference call placed to cardiology office to follow up, unable to speak with office due to hold time but office will call member directly to schedule follow up appointment for  [name].     Update 1/3 - Member report she is still recovering from stroke.  Attending speech therapy, feels it has helped a lot.  She is still staying with her daughter but is at her home alone during the day for some independence.  State blood pressure has remained "good" since being off Metoprolol, will follow up with cardiology on 1/17 and neurology on 1/26.  Report her anxiety has decreased some, had appointment with psych yesterday, follow up in 3 months.  After speaking with her PCP, determined that the anxiety may be caused by thyroid levels as she is also having mood swings, night sweats, and hair loss.  PCP in the process of re-assessing TSH levels.   Member inquires about the ongoing use of her oxygen, state she is now only wearing it as needed, oxygen levels are usually greater than 90%.  Encouraged to document throughout the day and discuss with provider as she would like to be completely off of it.  She has decreased her smoking to 1/2-1 cigarette per week.   Update 2/1 - Follow up appointment with cardiology completed on 1/17 and with neurology on 1/26, no changes in plan of care.  Loop recorder has not showed any evidenced of A-fib but she will continue to be monitored.  Discharged from outpatient rehab services and back in her own home, living independently as well as able to drive self for errands.  State blood pressure has been "good" and was told to lose weight, gained 20 pounds since she stopped smoking.  Does complain of intermittent hot flashes, was taken off estrogen supplement but will discuss with PCP on 2/7 during office visit.  Otherwise, state she is doing well.          Plan:  Telephone follow up appointment with care management team member scheduled for:  1 month The patient has been provided with contact information for the care management team and has been advised to call with any health related questions or [name].   Jocelyn David, RN, MSN, Sanct[facility] Manager [phone]

## 2021-09-07 ENCOUNTER — Other Ambulatory Visit: Payer: Self-pay

## 2021-09-07 ENCOUNTER — Ambulatory Visit: Payer: Medicare Other | Admitting: Speech Pathology

## 2021-09-07 ENCOUNTER — Ambulatory Visit: Payer: Medicare Other | Attending: Internal Medicine | Admitting: Speech Pathology

## 2021-09-07 ENCOUNTER — Encounter: Payer: Self-pay | Admitting: Speech Pathology

## 2021-09-07 DIAGNOSIS — E1165 Type 2 diabetes mellitus with hyperglycemia: Secondary | ICD-10-CM | POA: Diagnosis not present

## 2021-09-07 DIAGNOSIS — I872 Venous insufficiency (chronic) (peripheral): Secondary | ICD-10-CM | POA: Diagnosis not present

## 2021-09-07 DIAGNOSIS — I2510 Atherosclerotic heart disease of native coronary artery without angina pectoris: Secondary | ICD-10-CM | POA: Diagnosis not present

## 2021-09-07 DIAGNOSIS — E782 Mixed hyperlipidemia: Secondary | ICD-10-CM | POA: Diagnosis not present

## 2021-09-07 DIAGNOSIS — J441 Chronic obstructive pulmonary disease with (acute) exacerbation: Secondary | ICD-10-CM | POA: Diagnosis not present

## 2021-09-07 DIAGNOSIS — E039 Hypothyroidism, unspecified: Secondary | ICD-10-CM | POA: Diagnosis not present

## 2021-09-07 DIAGNOSIS — R41841 Cognitive communication deficit: Secondary | ICD-10-CM | POA: Diagnosis not present

## 2021-09-07 DIAGNOSIS — K5909 Other constipation: Secondary | ICD-10-CM | POA: Diagnosis not present

## 2021-09-07 DIAGNOSIS — I6389 Other cerebral infarction: Secondary | ICD-10-CM | POA: Diagnosis not present

## 2021-09-07 DIAGNOSIS — M62838 Other muscle spasm: Secondary | ICD-10-CM | POA: Diagnosis not present

## 2021-09-07 DIAGNOSIS — I5032 Chronic diastolic (congestive) heart failure: Secondary | ICD-10-CM | POA: Diagnosis not present

## 2021-09-07 DIAGNOSIS — I10 Essential (primary) hypertension: Secondary | ICD-10-CM | POA: Diagnosis not present

## 2021-09-08 NOTE — Therapy (Signed)
[facility]. [place], [place], [address] Phone: [phone]   Fax:  [phone]  Speech Language Pathology Treatment  Patient Details  Name: Jocelyn [name] MRN: [id number] Date of Birth: 1961-01-08 Referring Provider (SLP): [name] MD   Encounter Date: 09/07/2021   End of Session - 09/07/21 1031     Visit Number 11    Number of Visits 17    Date for SLP Re-Evaluation 09/14/21    SLP Start Time 1028   pt late   SLP Stop Time  1058    SLP Time Calculation (min) 30 min    Activity Tolerance Patient tolerated treatment well             Past Medical History:  Diagnosis Date   Anxiety    Arthritis    Chronic diastolic CHF (congestive heart failure), NYHA class 2 (HCC)    COPD (chronic obstructive pulmonary disease) ([place])    Depression    HTN (hypertension)    Hypercholesterolemia    Hypothyroidism (acquired)    Kidney stones 2015   Myocardial infarction ([place])    x3- pt unsure if MIs were in 1996 or 1999, had stent fixed in 2016 but no MI at that time per pt   Thyrotoxicosis    TIA (transient ischemic attack)    2013    Past Surgical History:  Procedure Laterality Date   BREAST CYST EXCISION     BUBBLE STUDY  06/10/2021   Procedure: BUBBLE STUDY;  Surgeon: Lelon Perla, MD;  Location: [facility];  Service: Cardiovascular;;   CARDIAC CATHETERIZATION N/A 08/24/2015   Procedure: Left Heart Cath and Coronary Angiography;  Surgeon: Jettie Booze, MD;  Location: [facility];  Service: Cardiovascular;  Laterality: N/A;   COLONOSCOPY     CORONARY [place]   and then had procedure to repair in 2016   [place] N/A 06/10/2021   Procedure: LOOP RECORDER INSERTION;  Surgeon: Thompson Grayer, MD;  Location: [facility];  Service: Cardiovascular;  Laterality: N/A;   partial hysterec     TEE WITHOUT CARDIOVERSION N/A 06/10/2021    Procedure: TRANSESOPHAGEAL ECHOCARDIOGRAM (TEE);  Surgeon: Lelon Perla, MD;  Location[facility];  Service: Cardiovascular;  Laterality: N/A;   THYROIDECTOMY      There were no vitals filed for this visit.   Subjective Assessment - 09/07/21 1030     Subjective "My friend told me "You sure do sound good.""    Currently in Pain? No/denies                   ADULT SLP TREATMENT - 09/08/21 1258       General Information   Behavior/Cognition Alert;Cooperative      Treatment Provided   Treatment provided Cognitive-Linquistic      Cognitive-Linquistic Treatment   Treatment focused on Cognition;Patient/family/caregiver education                SLP Short Term Goals - 07/19/21 1736       SLP SHORT TERM GOAL #1   Title Pt will participate in SLUMS screener in order to screen cognition.    Time 4    Period Weeks    Status Achieved    Target Date 07/12/21      SLP SHORT TERM GOAL #2   Title Pt will recall 3 word finding strategies to use in cases  of anomia with minA.    Time 4    Period Weeks    Status Achieved    Target Date 07/12/21      SLP SHORT TERM GOAL #3   Title Pt daughter/family will recall 3 communication strategies in order to decrease communication breakdowns with minA.    Time 4    Period Weeks    Status Achieved    Target Date 07/12/21              SLP Long Term Goals - 08/30/21 1709       SLP LONG TERM GOAL #1   Title Pt and  daughter/family will report successful use of word finding strategies to decrease anomia in conversation.    Time 8    Period Weeks    Status New      SLP LONG TERM GOAL #2   Title Pt and daughter/family will demonstrate use of communication strategies to reduce frustration and communication breakdowns during conversation.    Time 8    Period Weeks    Status New      SLP LONG TERM GOAL #3   Title Pt will recall 3 memory strategies to increase recall of important information during conversationst  at home independently.    Time 2    Period Weeks    Status New    Target Date 09/13/21              Plan - 09/08/21 1257     Clinical Impression Statement See tx note. Discussed memory strategies - pt reports she could benefit from more consistent external strategy use at home. Cont with current POC.    Speech Therapy Frequency 2x / week    Duration 8 weeks    Treatment/Interventions Compensatory strategies;Cueing hierarchy;Functional tasks;Patient/family education;Environmental controls;Cognitive reorganization;Multimodal communcation approach;Language facilitation;Compensatory techniques;Internal/external aids;SLP instruction and feedback    Potential to Achieve Goals Good    Potential Considerations Ability to learn/carryover information    Consulted and Agree with Plan of Care Patient;Family member/caregiver    Family Member Consulted Daughter - [name]             Patient will benefit from skilled therapeutic intervention in order to improve the following deficits and impairments:   Cognitive communication deficit    Problem List Patient Active Problem List   Diagnosis Date Noted   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   Cerebral infarction ([place]) 06/08/2021   COPD exacerbation ([place]) 05/16/2021   Acute on chronic respiratory failure with hypoxia ([place]) 05/16/2021   Hypokalemia 05/16/2021   AKI (acute kidney injury) ([place]) 05/16/2021   LV (left ventricular) mural thrombus 05/25/2020   Renal infarct ([place]) 05/16/2020   Hypoxia 05/16/2020   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   Proptosis 12/27/2019   Multinodular goiter 03/27/2019   Hypothyroidism 09/26/2018   Sacroiliitis, not elsewhere classified ([place]) 04/24/2017   Chronic right-sided low back pain with right-sided sciatica 01/12/2017   Chronic bilateral thoracic back pain 01/12/2017   Other spondylosis with radiculopathy, lumbar region 10/19/2016   Depression with anxiety 07/16/2016   Chronic diastolic CHF (congestive heart  failure) ([place]) 07/16/2016   Aortic atherosclerosis ([place]) 11/24/2015   Unstable angina ([facility]) 08/21/2015   Chest pain    Coronary artery disease    CAD (coronary artery disease), native coronary artery 01/14/2015   Hypertension 01/14/2015   Hypercholesterolemia 01/14/2015   Tobacco abuse 01/14/2015    Verdene Lennert, CCC-SLP 09/08/2021, 1:00 PM  [facility] [phone] [place], [place], [address] Phone: [phone]   Fax:  [phone]   Name: Jocelyn [name] MRN: [id number] Date of Birth: Jun 29, 1961

## 2021-09-13 ENCOUNTER — Ambulatory Visit: Payer: Medicare Other | Admitting: Speech Pathology

## 2021-09-20 ENCOUNTER — Ambulatory Visit: Payer: Medicare Other | Admitting: Speech Pathology

## 2021-09-20 ENCOUNTER — Ambulatory Visit (INDEPENDENT_AMBULATORY_CARE_PROVIDER_SITE_OTHER): Payer: Medicare Other

## 2021-09-20 ENCOUNTER — Other Ambulatory Visit: Payer: Self-pay | Admitting: Endocrinology

## 2021-09-20 DIAGNOSIS — I639 Cerebral infarction, unspecified: Secondary | ICD-10-CM

## 2021-09-20 DIAGNOSIS — E890 Postprocedural hypothyroidism: Secondary | ICD-10-CM

## 2021-09-20 LAB — CUP PACEART REMOTE DEVICE CHECK
Date Time Interrogation Session: 20230219231313
Implantable Pulse Generator Implant Date: 20221110

## 2021-09-21 ENCOUNTER — Ambulatory Visit: Payer: Medicare Other | Admitting: Speech Pathology

## 2021-09-21 DIAGNOSIS — I872 Venous insufficiency (chronic) (peripheral): Secondary | ICD-10-CM | POA: Diagnosis not present

## 2021-09-21 DIAGNOSIS — E039 Hypothyroidism, unspecified: Secondary | ICD-10-CM | POA: Diagnosis not present

## 2021-09-21 DIAGNOSIS — K5909 Other constipation: Secondary | ICD-10-CM | POA: Diagnosis not present

## 2021-09-21 DIAGNOSIS — E782 Mixed hyperlipidemia: Secondary | ICD-10-CM | POA: Diagnosis not present

## 2021-09-21 DIAGNOSIS — I5032 Chronic diastolic (congestive) heart failure: Secondary | ICD-10-CM | POA: Diagnosis not present

## 2021-09-21 DIAGNOSIS — I6389 Other cerebral infarction: Secondary | ICD-10-CM | POA: Diagnosis not present

## 2021-09-21 DIAGNOSIS — I2510 Atherosclerotic heart disease of native coronary artery without angina pectoris: Secondary | ICD-10-CM | POA: Diagnosis not present

## 2021-09-21 DIAGNOSIS — M62838 Other muscle spasm: Secondary | ICD-10-CM | POA: Diagnosis not present

## 2021-09-21 DIAGNOSIS — I10 Essential (primary) hypertension: Secondary | ICD-10-CM | POA: Diagnosis not present

## 2021-09-21 DIAGNOSIS — E1165 Type 2 diabetes mellitus with hyperglycemia: Secondary | ICD-10-CM | POA: Diagnosis not present

## 2021-09-21 DIAGNOSIS — J441 Chronic obstructive pulmonary disease with (acute) exacerbation: Secondary | ICD-10-CM | POA: Diagnosis not present

## 2021-09-27 ENCOUNTER — Ambulatory Visit: Payer: Medicare Other | Admitting: Speech Pathology

## 2021-09-27 NOTE — Progress Notes (Signed)
Carelink Summary Report / Loop Recorder 

## 2021-09-28 DIAGNOSIS — I5032 Chronic diastolic (congestive) heart failure: Secondary | ICD-10-CM | POA: Diagnosis not present

## 2021-09-28 DIAGNOSIS — I10 Essential (primary) hypertension: Secondary | ICD-10-CM | POA: Diagnosis not present

## 2021-09-28 DIAGNOSIS — E039 Hypothyroidism, unspecified: Secondary | ICD-10-CM | POA: Diagnosis not present

## 2021-09-28 DIAGNOSIS — Z0001 Encounter for general adult medical examination with abnormal findings: Secondary | ICD-10-CM | POA: Diagnosis not present

## 2021-09-29 ENCOUNTER — Encounter: Payer: Self-pay | Admitting: Gastroenterology

## 2021-09-29 ENCOUNTER — Other Ambulatory Visit: Payer: Self-pay | Admitting: *Deleted

## 2021-09-29 NOTE — Patient Outreach (Signed)
[facility]) Care Management Telephonic RN Care Manager Note   09/29/2021 Name:  Jocelyn [name] MRN:  [id number] DOB:  1961/07/14  Summary: Jocelyn [name] call placed to member, successful.  Denies any urgent concerns, encouraged to contact this care manager with questions.    Recommendations/Changes made from today's visit: Monitor blood pressure daily, record and share with provider.  Seek medical attention with signs/symptoms of stroke.  Subjective: Jocelyn [name] is an 61 y.o. year old female who is a primary patient of Jocelyn [name], Jocelyn Beard, MD. The care management team was consulted for assistance with care management and/or care coordination needs.    Telephonic RN Care Manager completed Telephone Visit today.  Objective:   Medications Reviewed Today     Reviewed by Jocelyn [name], CCC-SLP (Speech and Language Pathologist) on 09/07/21 at 19  Med List Status: <None>   Medication Order Taking? Sig Documenting Provider Last Dose Status Informant  albuterol (PROVENTIL HFA;VENTOLIN HFA) 108 (90 Base) MCG/ACT inhaler [id number] No Inhale 2 puffs into the lungs in the morning and at bedtime. [provider] Past Week Expired 06/09/21 2359 Self           Med Note Gar Ponto   Fri May 29, 2020 11:14 AM)    ALPRAZolam Jocelyn [name]) 1 MG tablet [id number] No Take 1 mg by mouth 3 (three) times daily as needed for anxiety. [provider] Taking Active Self           Med Note Gar Ponto   Fri May 29, 2020 11:14 AM)    aspirin EC 81 MG tablet [id number] No Take 81 mg by mouth daily. Swallow whole. [provider] Taking Active   buPROPion (WELLBUTRIN XL) 300 MG 24 hr tablet [id number] No Take 300 mg by mouth daily. [provider] Taking Active Self  clopidogrel (PLAVIX) 75 MG tablet [id number] No Take 1 tablet (75 mg total) by mouth daily. Jocelyn Haff, MD Taking Active   cyclobenzaprine (FLEXERIL) 10 MG tablet [id number] No Take 1  tablet (10 mg total) by mouth 3 (three) times daily as needed for muscle spasms. Jocelyn Slick, NP Taking Active Self  furosemide (LASIX) 40 MG tablet [id number] No Take 40 mg by mouth daily. [provider] Taking Active Self  ibuprofen (ADVIL) 200 MG tablet [id number] No Take 400 mg by mouth every 6 (six) hours as needed for mild pain. [provider] Taking Active Self  ipratropium-albuterol (DUONEB) 0.5-2.5 (3) MG/3ML SOLN [id number] No Take 3 mLs by nebulization every 6 (six) hours as needed.  Patient taking differently: Take 3 mLs by nebulization every 6 (six) hours as needed (shortness of breath, wheezing).   [name], [name], MD Taking Active Self  isosorbide mononitrate (IMDUR) 30 MG 24 hr tablet [id number] No Take 2 tablets (60 mg total) by mouth daily. Jocelyn Odea, MD Taking Active Self           Med Note Gar Ponto   Fri May 29, 2020 11:14 AM)    levothyroxine (SYNTHROID) 112 MCG tablet [id number] No TAKE 1 TABLET BY MOUTH EVERY MORNING Jocelyn Shin, MD Taking Active Self  LINZESS 145 MCG CAPS capsule [id number] No Take 145 mcg by mouth daily. [provider] Taking Active Self  MELATONIN PO [id number] No Take 1 tablet by mouth at bedtime as needed (sleep). [provider] Taking Active Self  Methylsulfonylmethane (MSM) 1000 MG TABS [id number] No Take 1 tablet by mouth daily. [provider] Taking Active  nitroGLYCERIN (NITROSTAT) 0.3 MG SL tablet [id number] No Place 0.3 mg under the tongue every 5 (five) minutes as needed for chest pain. [provider] Taking Active Self           Med Note Belva Agee   Sat Jul 16, 2016  5:59 PM)    rosuvastatin (CRESTOR) 20 MG tablet [id number] No Take 1 tablet (20 mg total) by mouth daily. Jocelyn Haff, MD Taking Active   SYMBICORT 160-4.5 MCG/ACT inhaler [id number] No Inhale 2 puffs into the lungs daily. [provider] Taking Active   triamcinolone cream (KENALOG)  0.5 % [id number] No Apply 1 application topically daily as needed (for sun exposure). [provider] Taking Active Self             SDOH:  (Social Determinants of Health) assessments and interventions performed:     Care Plan  Review of patient past medical history, allergies, medications, health status, including review of consultants reports, laboratory and other test data, was performed as part of comprehensive evaluation for care management services.   Care Plan : [facility] Plan of Care (Adult)  Updates made by Jocelyn David, RN since 09/29/2021 12:00 AM     Problem: Care Coordination needs and assistance with managing chronic medical conditions (COPD & Stroke)   Priority: High     Long-Range Goal: Adequate management of chronic medical conditions (COPD & Stroke)   Start Date: 06/22/2021  Expected End Date: 12/19/2021  This Visit's Progress: On track  Recent Progress: On track  Priority: High  Note:   Current Barriers:  Knowledge Deficits related to plan of care for management of COPD, Tobacco Use, and Stroke Care Coordination needs related to Mental Health Concerns  Has depression post stroke  RNCM Clinical Goal(s):  Patient will verbalize understanding of plan for management of COPD, Tobacco Use, and Stroke take all medications exactly as prescribed and will call provider for medication related questions attend all scheduled medical appointments: Cardiology, office called to request follow up with assigned provider, Dr. Irish Lack continue to work with RN Care Manager to address care management and care coordination needs related to  COPD, Tobacco Use, and Stroke work with Education officer, museum to address  related to the management of [place] Concerns  related to the management of COPD, Tobacco Use, and Stroke  through collaboration with Consulting civil engineer, provider, and care team.   Interventions: Inter-disciplinary care team collaboration (see longitudinal plan of  care) Evaluation of current treatment plan related to  self management and patient's adherence to plan as established by provider  StrokeGoal on track:  Yes. Evaluation of current treatment plan related to Tobacco Use and Stroke , Mental Health Concerns  self-management and patient's adherence to plan as established by provider. Discussed plans with patient for ongoing care management follow up and provided patient with direct contact information for care management team Provided education to patient re: smoking cessation and increased risk for recurrent stroke; Reviewed medications with patient and discussed notifying MD with changes; Social Work referral for depression, elevated PHQ9;  COPD Interventions:   Advised patient to track and manage COPD triggers;  Discussed the importance of adequate rest and management of fatigue with COPD; Screening for signs and symptoms of depression related to chronic disease state;   Patient Goals/Self-Care Activities: Patient will self administer medications as prescribed Patient will attend all scheduled provider appointments Patient will continue to perform ADL's independently Patient will continue to perform IADL's independently  Follow Up Plan:  Telephone follow up appointment with care management team member scheduled for:  11/02/2021 The patient has been provided with contact information for the care management team and has been advised to call with any health related questions or concerns.    Update 11/29 - Red EMMI alert received today, denies complaints.  State she is improving since starting outpatient speech therapy.   Update 12/6 - Spoke with member and daughter, they both report member is recovering well.  She has appointment with psych for depression/anxiety on 1/2, will continue follow up with CSW until then.  They are concerned about her blood pressure ranging 90-100s/60's, stopped taking Metoprolol.  She remains on Lasix and Imdur.   Conference call placed to cardiology office to follow up, unable to speak with office due to hold time but office will call member directly to schedule follow up appointment for concerns.     Update 1/3 - Member report she is still recovering from stroke.  Attending speech therapy, feels it has helped a lot.  She is still staying with her daughter but is at her home alone during the day for some independence.  State blood pressure has remained "good" since being off Metoprolol, will follow up with cardiology on 1/17 and neurology on 1/26.  Report her anxiety has decreased some, had appointment with psych yesterday, follow up in 3 months.  After speaking with her PCP, determined that the anxiety may be caused by thyroid levels as she is also having mood swings, night sweats, and hair loss.  PCP in the process of re-assessing TSH levels.   Member inquires about the ongoing use of her oxygen, state she is now only wearing it as needed, oxygen levels are usually greater than 90%.  Encouraged to document throughout the day and discuss with provider as she would like to be completely off of it.  She has decreased her smoking to 1/2-1 cigarette per week.   Update 2/1 - Follow up appointment with cardiology completed on 1/17 and with neurology on 1/26, no changes in plan of care.  Loop recorder has not showed any evidenced of A-fib but she will continue to be monitored.  Discharged from outpatient rehab services and back in her own home, living independently as well as able to drive self for errands.  State blood pressure has been "good" and was told to lose weight, gained 20 pounds since she stopped smoking.  Does complain of intermittent hot flashes, was taken off estrogen supplement but will discuss with PCP on 2/7 during office visit.  Otherwise, state she is doing well.     Update 3/1 - Member report she has continued to recover well.  Was seen by PCP yesterday for AWV, state she is still concerned about her  memory and speech at times.  Advised by this care manager and providers that stroke recovery can take time, encouragement provided on progress thus far.  Aware of signs/symptoms of stroke, encouraged to seek medical attention should she experience again.  Has appointment with GI on 4/3 for possible hernia, denies current pain or discomfort.          Plan:  Telephone follow up appointment with care management team member scheduled for:  1 month The patient has been provided with contact information for the care management team and has been advised to call with any health related questions or concerns.   Jocelyn David, RN, MSN, [place] Manager [phone]

## 2021-10-25 ENCOUNTER — Ambulatory Visit (INDEPENDENT_AMBULATORY_CARE_PROVIDER_SITE_OTHER): Payer: Medicare Other

## 2021-10-25 DIAGNOSIS — I639 Cerebral infarction, unspecified: Secondary | ICD-10-CM | POA: Diagnosis not present

## 2021-10-26 ENCOUNTER — Emergency Department (HOSPITAL_COMMUNITY): Payer: Medicare Other

## 2021-10-26 ENCOUNTER — Encounter (HOSPITAL_COMMUNITY): Payer: Self-pay | Admitting: Emergency Medicine

## 2021-10-26 ENCOUNTER — Other Ambulatory Visit: Payer: Self-pay

## 2021-10-26 ENCOUNTER — Emergency Department (HOSPITAL_COMMUNITY)
Admission: EM | Admit: 2021-10-26 | Discharge: 2021-10-26 | Disposition: A | Discharge status: Home / Self Care | Payer: Medicare Other | Attending: Emergency Medicine | Admitting: Emergency Medicine

## 2021-10-26 DIAGNOSIS — R1013 Epigastric pain: Secondary | ICD-10-CM

## 2021-10-26 DIAGNOSIS — K439 Ventral hernia without obstruction or gangrene: Secondary | ICD-10-CM | POA: Diagnosis not present

## 2021-10-26 DIAGNOSIS — J449 Chronic obstructive pulmonary disease, unspecified: Secondary | ICD-10-CM | POA: Insufficient documentation

## 2021-10-26 DIAGNOSIS — Z7901 Long term (current) use of anticoagulants: Secondary | ICD-10-CM | POA: Insufficient documentation

## 2021-10-26 DIAGNOSIS — R1010 Upper abdominal pain, unspecified: Secondary | ICD-10-CM | POA: Diagnosis present

## 2021-10-26 DIAGNOSIS — I509 Heart failure, unspecified: Secondary | ICD-10-CM | POA: Diagnosis not present

## 2021-10-26 DIAGNOSIS — R109 Unspecified abdominal pain: Secondary | ICD-10-CM | POA: Diagnosis not present

## 2021-10-26 DIAGNOSIS — E782 Mixed hyperlipidemia: Secondary | ICD-10-CM | POA: Diagnosis not present

## 2021-10-26 DIAGNOSIS — I5032 Chronic diastolic (congestive) heart failure: Secondary | ICD-10-CM | POA: Diagnosis not present

## 2021-10-26 DIAGNOSIS — Z7982 Long term (current) use of aspirin: Secondary | ICD-10-CM | POA: Diagnosis not present

## 2021-10-26 DIAGNOSIS — I10 Essential (primary) hypertension: Secondary | ICD-10-CM | POA: Diagnosis not present

## 2021-10-26 DIAGNOSIS — J441 Chronic obstructive pulmonary disease with (acute) exacerbation: Secondary | ICD-10-CM | POA: Diagnosis not present

## 2021-10-26 DIAGNOSIS — E119 Type 2 diabetes mellitus without complications: Secondary | ICD-10-CM | POA: Insufficient documentation

## 2021-10-26 DIAGNOSIS — E039 Hypothyroidism, unspecified: Secondary | ICD-10-CM | POA: Diagnosis not present

## 2021-10-26 LAB — COMPREHENSIVE METABOLIC PANEL
ALT: 21 U/L (ref 0–44)
AST: 22 U/L (ref 15–41)
Albumin: 3.5 g/dL (ref 3.5–5.0)
Alkaline Phosphatase: 59 U/L (ref 38–126)
Anion gap: 10 (ref 5–15)
BUN: 7 mg/dL (ref 6–20)
CO2: 27 mmol/L (ref 22–32)
Calcium: 9.5 mg/dL (ref 8.9–10.3)
Chloride: 104 mmol/L (ref 98–111)
Creatinine, Ser: 1.17 mg/dL — ABNORMAL HIGH (ref 0.44–1.00)
GFR, Estimated: 53 mL/min — ABNORMAL LOW (ref >60)
Glucose, Bld: 135 mg/dL — ABNORMAL HIGH (ref 70–99)
Potassium: 3.4 mmol/L — ABNORMAL LOW (ref 3.5–5.1)
Sodium: 141 mmol/L (ref 135–145)
Total Bilirubin: 0.4 mg/dL (ref 0.3–1.2)
Total Protein: 6.7 g/dL (ref 6.5–8.1)

## 2021-10-26 LAB — CBC WITH DIFFERENTIAL/PLATELET
Abs Immature Granulocytes: 0.01 10*3/uL (ref 0.00–0.07)
Basophils Absolute: 0 10*3/uL (ref 0.0–0.1)
Basophils Relative: 1 %
Eosinophils Absolute: 0 10*3/uL (ref 0.0–0.5)
Eosinophils Relative: 1 %
HCT: 38.9 % (ref 36.0–46.0)
Hemoglobin: 13 g/dL (ref 12.0–15.0)
Immature Granulocytes: 0 %
Lymphocytes Relative: 54 %
Lymphs Abs: 3.4 10*3/uL (ref 0.7–4.0)
MCH: 29.4 pg (ref 26.0–34.0)
MCHC: 33.4 g/dL (ref 30.0–36.0)
MCV: 88 fL (ref 80.0–100.0)
Monocytes Absolute: 0.4 10*3/uL (ref 0.1–1.0)
Monocytes Relative: 7 %
Neutro Abs: 2.3 10*3/uL (ref 1.7–7.7)
Neutrophils Relative %: 37 %
Platelets: 267 10*3/uL (ref 150–400)
RBC: 4.42 MIL/uL (ref 3.87–5.11)
RDW: 12.8 % (ref 11.5–15.5)
WBC: 6.2 10*3/uL (ref 4.0–10.5)
nRBC: 0 % (ref 0.0–0.2)

## 2021-10-26 LAB — URINALYSIS, ROUTINE W REFLEX MICROSCOPIC
Bacteria, UA: NONE SEEN
Bilirubin Urine: NEGATIVE
Glucose, UA: NEGATIVE mg/dL
Hgb urine dipstick: NEGATIVE
Ketones, ur: NEGATIVE mg/dL
Nitrite: NEGATIVE
Protein, ur: NEGATIVE mg/dL
Specific Gravity, Urine: 1.017 (ref 1.005–1.030)
pH: 6 (ref 5.0–8.0)

## 2021-10-26 LAB — CUP PACEART REMOTE DEVICE CHECK
Date Time Interrogation Session: 20230326231236
Implantable Pulse Generator Implant Date: 20221110

## 2021-10-26 LAB — LIPASE, BLOOD: Lipase: 26 U/L (ref 11–51)

## 2021-10-26 MED ORDER — ALUM & MAG HYDROXIDE-SIMETH 200-200-20 MG/5ML PO SUSP
30.0000 mL | Freq: Once | ORAL | Status: AC
  Administered 2021-10-26: 30 mL via ORAL
  Filled 2021-10-26: qty 30

## 2021-10-26 MED ORDER — PANTOPRAZOLE SODIUM 40 MG PO TBEC: 40.0000 mg | DELAYED_RELEASE_TABLET | Freq: Every day | ORAL | 0 refills | Status: DC

## 2021-10-26 MED ORDER — IOHEXOL 300 MG/ML  SOLN
100.0000 mL | Freq: Once | INTRAMUSCULAR | Status: AC | PRN
  Administered 2021-10-26: 100 mL via INTRAVENOUS

## 2021-10-26 MED ORDER — ONDANSETRON HCL 4 MG PO TABS: 4.0000 mg | ORAL_TABLET | Freq: Three times a day (TID) | ORAL | 0 refills | Status: DC | PRN

## 2021-10-26 MED ORDER — ACETAMINOPHEN 325 MG PO TABS
650.0000 mg | ORAL_TABLET | Freq: Once | ORAL | Status: AC
  Administered 2021-10-26: 650 mg via ORAL
  Filled 2021-10-26: qty 2

## 2021-10-26 MED ORDER — PANTOPRAZOLE SODIUM 40 MG IV SOLR
40.0000 mg | Freq: Once | INTRAVENOUS | Status: AC
  Administered 2021-10-26: 40 mg via INTRAVENOUS
  Filled 2021-10-26: qty 10

## 2021-10-26 NOTE — ED Provider Triage Note (Signed)
Emergency Medicine Provider Triage Evaluation Note ? ?Jocelyn [name] , a 61 y.o. female  was evaluated in triage.  Pt complains of hernia.  She was sent here by her PCP. She has been dealing with a hernia in her abdomen for a while. Over the past week, her abdomen has become more painful. She has been struggling with constipation and nausea. Last BM was today, but she said it was not a normal BM. No vomiting or diarrhea. No dysuria or hematuria. PCP wanted her to get a CT scan.  ? ?Review of Systems  ?Positive: Abdominal pain, constipation, nausea ?Negative:  ? ?Physical Exam  ?BP 112/75 (BP Location: Left Arm)   Pulse 67   Temp 98.1 ?F (36.7 ?C) (Oral)   Resp 15   SpO2 98%  ?Gen:   Awake, no distress   ?Resp:  Normal effort  ?MSK:   Moves extremities without difficulty  ?Other:  Abdomen soft, I do not feel a hernia. ? ?Medical Decision Making  ?Medically screening exam initiated at 8:23 PM.  Appropriate orders placed.  Jocelyn [name] was informed that the remainder of the evaluation will be completed by another provider, this initial triage assessment does not replace that evaluation, and the importance of remaining in the ED until their evaluation is complete. ? ? ?  ?Adolphus Birchwood, PA-C ?10/26/21 2025 ? ?

## 2021-10-26 NOTE — Discharge Instructions (Addendum)
You can take maalox, available over the counter according to label instructions as needed for gas/bloating.   ? ?You had a CT scan performed in the [facility] Department today.  The cause of your symptoms was not identified.  You do have atherosclerosis of your aorta that will need to be followed up by your family doctor.   ? ?Get rechecked if you have new or concerning symptoms.   ?

## 2021-10-26 NOTE — ED Provider Notes (Signed)
?[facility] ?Provider Note ? ? ?CSN: [id number] ?Arrival date & time: 10/26/21  1953 ? ?  ? ?History ? ?Chief Complaint  ?Patient presents with  ? Hernia  ? ? ?Jocelyn [name] is a 61 y.o. female. ? ?The history is provided by the patient and medical records.  ?Jocelyn [name] is a 61 y.o. female who presents to the Emergency Department complaining of abdominal pain.  She presents to the emergency department upon referral from her PCP for evaluation of 3 weeks of abdominal pain.  No reports of injuries.  She is concerned for hernia.  She reports pain throughout her entire upper abdomen, mostly in the central upper abdominal region.  Pain is worse with laying down and rolling from side to side.  She feels like she has increased bloating in the morning.  She does have decreased appetite and feels full more quickly.  She also has occasional nausea.  No fevers, chest pain, vomiting, difficulty breathing, diarrhea, dysuria, constipation.  No prior similar symptoms. ?She has a history of CVA, diabetes, COPD, CHF. ? ?Home Medications ?Prior to Admission medications   ?Medication Sig Start Date End Date Taking? Authorizing Provider  ?ondansetron (ZOFRAN) 4 MG tablet Take 1 tablet (4 mg total) by mouth every 8 (eight) hours as needed for nausea or vomiting. 10/26/21  Yes Quintella Reichert, MD  ?pantoprazole (PROTONIX) 40 MG tablet Take 1 tablet (40 mg total) by mouth daily. 10/26/21  Yes Quintella Reichert, MD  ?albuterol (PROVENTIL HFA;VENTOLIN HFA) 108 (90 Base) MCG/ACT inhaler Inhale 2 puffs into the lungs in the morning and at bedtime. 06/21/15 06/09/21  [provider]  ?ALPRAZolam [name]) 1 MG tablet Take 1 mg by mouth 3 (three) times daily as needed for anxiety. 05/10/20   [provider]  ?aspirin EC 81 MG tablet Take 81 mg by mouth daily. Swallow whole.    [provider]  ?buPROPion (WELLBUTRIN XL) 300 MG 24 hr tablet Take 300 mg by mouth daily. 05/24/21    [provider]  ?clopidogrel (PLAVIX) 75 MG tablet Take 1 tablet (75 mg total) by mouth daily. 06/10/21 06/10/22  [name], MD  ?cyclobenzaprine (FLEXERIL) 10 MG tablet Take 1 tablet (10 mg total) by mouth 3 (three) times daily as needed for muscle spasms. 03/29/17   [name], NP  ?furosemide (LASIX) 40 MG tablet Take 40 mg by mouth daily.    [provider]  ?ibuprofen (ADVIL) 200 MG tablet Take 400 mg by mouth every 6 (six) hours as needed for mild pain.    [provider]  ?ipratropium-albuterol (DUONEB) 0.5-2.5 (3) MG/3ML SOLN Take 3 mLs by nebulization every 6 (six) hours as needed. ?Patient taking differently: Take 3 mLs by nebulization every 6 (six) hours as needed (shortness of breath, wheezing). 05/21/21   [name], [name], MD  ?isosorbide mononitrate (IMDUR) 30 MG 24 hr tablet Take 2 tablets (60 mg total) by mouth daily. 07/17/16   [name], MD  ?levothyroxine (SYNTHROID) 112 MCG tablet TAKE 1 TABLET BY MOUTH EVERY DAY IN THE MORNING 09/20/21   [name], MD  ?Rolan Lipa 145 MCG CAPS capsule Take 145 mcg by mouth daily. 04/30/20   [provider]  ?MELATONIN PO Take 1 tablet by mouth at bedtime as needed (sleep).    [provider]  ?Methylsulfonylmethane (MSM) 1000 MG TABS Take 1 tablet by mouth daily.    [provider]  ?nitroGLYCERIN (NITROSTAT) 0.3 MG SL tablet Place 0.3  mg under the tongue every 5 (five) minutes as needed for chest pain. 10/28/15   [provider]  ?rosuvastatin (CRESTOR) 20 MG tablet Take 1 tablet (20 mg total) by mouth daily. 06/10/21   [name], MD  ?[facility] 160-4.5 MCG/ACT inhaler Inhale 2 puffs into the lungs daily. 07/26/21   [provider]  ?triamcinolone cream (KENALOG) 0.5 % Apply 1 application topically daily as needed (for sun exposure).    [provider]  ?   ? ?Allergies    ?Hydrocodone   ? ?Review of Systems   ?Review of Systems  ?All other systems reviewed and  are negative. ? ?Physical Exam ?Updated Vital Signs ?BP 115/72 (BP Location: Right Arm)   Pulse 60   Temp 98.3 ?F (36.8 ?C) (Oral)   Resp 18   SpO2 98%  ?Physical Exam ?Vitals and nursing note reviewed.  ?Constitutional:   ?   Appearance: She is well-developed.  ?HENT:  ?   Head: Normocephalic and atraumatic.  ?Cardiovascular:  ?   Rate and Rhythm: Normal rate and regular rhythm.  ?   Heart sounds: No murmur heard. ?Pulmonary:  ?   Effort: Pulmonary effort is normal. No respiratory distress.  ?   Breath sounds: Normal breath sounds.  ?Abdominal:  ?   Palpations: Abdomen is soft.  ?   Tenderness: There is no guarding or rebound.  ?   Comments: No appreciable hernia on abdominal examination.  There is mild tenderness to palpation throughout the upper abdomen, primarily over the epigastric region.  ?Musculoskeletal:     ?   General: No tenderness.  ?Skin: ?   General: Skin is warm and dry.  ?Neurological:  ?   Mental Status: She is alert and oriented to person, place, and time.  ?Psychiatric:     ?   Behavior: Behavior normal.  ? ? ?ED Results / Procedures / Treatments   ?Labs ?(all labs ordered are listed, but only abnormal results are displayed) ?Labs Reviewed  ?COMPREHENSIVE METABOLIC PANEL - Abnormal; Notable for the following components:  ?    Result Value  ? Potassium 3.4 (*)   ? Glucose, Bld 135 (*)   ? Creatinine, Ser 1.17 (*)   ? GFR, Estimated 53 (*)   ? All other components within normal limits  ?URINALYSIS, ROUTINE W REFLEX MICROSCOPIC - Abnormal; Notable for the following components:  ? Leukocytes,Ua TRACE (*)   ? All other components within normal limits  ?CBC WITH DIFFERENTIAL/PLATELET  ?LIPASE, BLOOD  ? ? ?EKG ?None ? ?Radiology ?CT Abdomen Pelvis W Contrast ? ?Result Date: 10/26/2021 ?CLINICAL DATA:  Abdominal pain with possible hernia. EXAM: CT ABDOMEN AND PELVIS WITH CONTRAST TECHNIQUE: Multidetector CT imaging of the abdomen and pelvis was performed using the standard protocol following bolus  administration of intravenous contrast. RADIATION DOSE REDUCTION: This exam was performed according to the departmental dose-optimization program which includes automated exposure control, adjustment of the mA and/or kV according to patient size and/or use of iterative reconstruction technique. CONTRAST:  100 mL of Omnipaque 300 COMPARISON:  May 15, 2020 FINDINGS: Lower chest: No acute abnormality. Hepatobiliary: No focal liver abnormality is seen. No gallstones, gallbladder wall thickening, or biliary dilatation. Pancreas: Unremarkable. No pancreatic ductal dilatation or surrounding inflammatory changes. Spleen: Normal in size without focal abnormality. Adrenals/Urinary Tract: Adrenal glands are unremarkable. The kidneys are normal in size, bilaterally. Mild multifocal cortical scarring is seen along the lateral aspect of the mid left kidney. 7 mm and 11 mm nonobstructing  renal calculi are seen within the mid and lower left kidney. The urinary bladder is poorly distended and subsequently limited in evaluation. Mild bladder wall thickening is noted. Stomach/Bowel: Stomach is within normal limits. Appendix appears normal. No evidence of bowel wall thickening, distention, or inflammatory changes. Vascular/Lymphatic: Aortic atherosclerosis. No enlarged abdominal or pelvic lymph nodes. Reproductive: Status post hysterectomy. No adnexal masses. Other: No abdominal wall hernia or abnormality. No abdominopelvic ascites. Musculoskeletal: No acute or significant osseous findings. IMPRESSION: 1. 7 mm and 11 mm nonobstructing left renal calculi. 2. Mild bladder wall thickening, which may be secondary to poor bladder distension. Correlation with urinalysis is recommended to exclude the presence of an underlying cystitis. 3. No evidence of hernia. 4. Aortic atherosclerosis. Aortic Atherosclerosis (ICD10-I70.0). Electronically Signed   By: Virgina Norfolk M.D.   On: 10/26/2021 22:24   ? ?Procedures ?Procedures   ? ? ?Medications Ordered in ED ?Medications  ?acetaminophen (TYLENOL) tablet 650 mg (has no administration in time range)  ?alum & mag hydroxide-simeth (MAALOX/MYLANTA) 200-200-20 MG/5ML suspension 30 mL (has no administra

## 2021-10-26 NOTE — ED Triage Notes (Signed)
Pt reported to ED with c/o pain to abdominal hernia x3 weeks. Was sent here by PCP for further evaluation.  ?

## 2021-11-01 ENCOUNTER — Encounter: Payer: Self-pay | Admitting: Gastroenterology

## 2021-11-01 ENCOUNTER — Ambulatory Visit (INDEPENDENT_AMBULATORY_CARE_PROVIDER_SITE_OTHER): Payer: Medicare Other | Admitting: Gastroenterology

## 2021-11-01 VITALS — BP 108/62 | HR 64 | Ht 64.0 in | Wt 222.0 lb

## 2021-11-01 DIAGNOSIS — K5900 Constipation, unspecified: Secondary | ICD-10-CM | POA: Diagnosis not present

## 2021-11-01 DIAGNOSIS — R140 Abdominal distension (gaseous): Secondary | ICD-10-CM | POA: Diagnosis not present

## 2021-11-01 NOTE — Patient Instructions (Signed)
If you are age 61 or older, your body mass index should be between 23-30. Your Body mass index is 38.11 kg/m?Marland Kitchen If this is out of the aforementioned range listed, please consider follow up with your Primary Care Provider. ? ?If you are age 33 or younger, your body mass index should be between 19-25. Your Body mass index is 38.11 kg/m?Marland Kitchen If this is out of the aformentioned range listed, please consider follow up with your Primary Care Provider.  ? ?________________________________________________________ ? ?The [facility] providers would like to encourage you to use [facility] to communicate with providers for non-urgent requests or questions.  Due to long hold times on the telephone, sending your provider a message by [facility] may be a faster and more efficient way to get a response.  Please allow 48 business hours for a response.  Please remember that this is for non-urgent requests.  ?_______________________________________________________ ? ?Please discontinue fiber supplement. ? ?Please purchase the following medications over the counter and take as directed: ?Miralax: Take as directed twice daily and titrate as needed ? ?If not effective you can try Linzess 290 mcg once daily before breakfast.  We are giving you a sample today to try.  If this works well for you, please let us know and we can send a prescription. ? ?Please give [place] an update in 2 weeks via MyChart.. ? ?Thank you for entrusting me with your care and for choosing [employer], ?Dr. [place] Cellar ? ? ? ? ? ?

## 2021-11-01 NOTE — Progress Notes (Signed)
? ?HPI :  ?61 year old female here to reestablish care, last seen in September 2019.  She has a history of stroke this past November on Plavix, history of COPD, constipation, CAD/CHF, referred by Dr. Benito Mccreedy MD for abdominal distention and constipation. ? ?Patient states she had a stroke this past November, on Plavix, she also has COPD flare, may have been on some steroids for some time.  Initially when she was sick she lost some weight but has since regained weight and went from 195 pounds to 222 pounds over the past few months.  She states her upper abdomen feels distended and bloated at times.  She thought she may have had a hernia, had a CT scan when in the emergency room on March 28 which showed no evidence of any abdominal wall pathology or hernia.  She had some small nonobstructing left renal calculi and some mild bladder thickening but had a urinalysis that was negative and she denies any dysuria. ? ?She states she has noticed symptoms at least ongoing since December.  She has had constipation since the symptoms started.  Normally has a few bowel movements per day, has had less frequency since that time.  Was placed on some Metamucil previously but has not really helped her so far.  Denies any blood in her stools.  Her abdomen feels better after he has a bowel movement with less gas and abdominal distention.  No nausea or vomiting, otherwise eating well.  She had been on Linzess in the past which she states initially helped with the symptoms but then she states it stopped helping and she stopped it.  She started MiraLAX before but has not really taken it.  She states she went to the emergency room when she felt her abdomen feel "hard", and worsening pain than usual. ? ?Her father has a history of colon cancer in age 62s.  She last had a colonoscopy with me in September 2019, had a hyperplastic polyp removed and no other concerning pathology, was told to repeat in 10 years.  She had blood work in  the ED just a few days ago showing normal CBC, normal c-Met, normal lipase.  He is otherwise feeling okay at baseline.  Denies cardiopulmonary symptoms at this time. ? ? ?Echo TEE 06/10/21 - EF 40-45%, mild LV dysfunction ? ? ?Colonoscopy 04/30/2018 - The perianal and digital rectal examinations were normal. ?- The terminal ileum appeared normal. ?- A 5 mm polyp was found in the transverse colon. The polyp was flat. The polyp was ?removed with a cold snare. Resection and retrieval were complete. ?- A localized area of moderately erythematous mucosa with altered vascularity was found in ?the descending colon. Biopsies were taken with a cold forceps for histology. ?- The exam was otherwise without abnormality. ? ?1. Surgical [P], transverse, polyp ?- HYPERPLASTIC POLYP (1 OF 2 FRAGMENTS) ?- BENIGN COLONIC MUCOSA (1 OF 2 FRAGMENTS) ?- NO HIGH GRADE DYSPLASIA OR MALIGNANCY IDENTIFIED ?2. Surgical [P], left colon biopsies inflammation ?- FOCAL ACTIVE COLITIS AND EROSIONS ?- NO EVIDENCE OF CHRONICITY, GRANULOMA, OR MALIGNANCY IDENTIFIED ?- SEE COMMENT ? ?2. The histologic features are non-specific and can be seen in low grade ischemia, self-limited infectious processes, and inflammatory bowel disease. Clinical-pathologic correlation is recommended. ? ?Repeat in 10 years ? ? ?CT scan 10/26/2021: ?IMPRESSION: ?1. 7 mm and 11 mm nonobstructing left renal calculi. ?2. Mild bladder wall thickening, which may be secondary to poor ?bladder distension. Correlation with urinalysis is recommended to ?exclude the  presence of an underlying cystitis. ?3. No evidence of hernia. ?4. Aortic atherosclerosis. ?  ? ? ?Past Medical History:  ?Diagnosis Date  ? Anxiety   ? Arthritis   ? Chronic diastolic CHF (congestive heart failure), NYHA class 2 ([place])   ? COPD (chronic obstructive pulmonary disease) ([place])   ? Depression   ? HTN (hypertension)   ? Hypercholesterolemia   ? Hypothyroidism (acquired)   ? Kidney stones 2015  ? Myocardial  infarction [facility])   ? x3- pt unsure if MIs were in 1996 or 1999, had stent fixed in 2016 but no MI at that time per pt  ? Stroke [facility]) 06/2021  ? Thyrotoxicosis   ? TIA (transient ischemic attack)   ? 2013  ? ? ? ?Past Surgical History:  ?Procedure Laterality Date  ? BREAST CYST EXCISION    ? BUBBLE STUDY  06/10/2021  ? Procedure: BUBBLE STUDY;  Surgeon: Lelon Perla, MD;  Location: [facility];  Service: Cardiovascular;;  ? CARDIAC CATHETERIZATION N/A 08/24/2015  ? Procedure: Left Heart Cath and Coronary Angiography;  Surgeon: Jettie Booze, MD;  Location: [place] CV [facility];  Service: Cardiovascular;  Laterality: N/A;  ? COLONOSCOPY    ? [place]  ? and then had procedure to repair in 2016  ? KNEE SURGERY    ? LOOP RECORDER INSERTION N/A 06/10/2021  ? Procedure: LOOP RECORDER INSERTION;  Surgeon: Thompson Grayer, MD;  Location: [place] CV LAB;  Service: Cardiovascular;  Laterality: N/A;  ? partial hysterec    ? TEE WITHOUT CARDIOVERSION N/A 06/10/2021  ? Procedure: TRANSESOPHAGEAL ECHOCARDIOGRAM (TEE);  Surgeon: Lelon Perla, MD;  Location: [facility] ENDOSCOPY;  Service: Cardiovascular;  Laterality: N/A;  ? THYROIDECTOMY    ? ?Family History  ?Problem Relation Age of Onset  ? Heart disease Mother   ? Stroke Mother   ? Hypertension Mother   ? Heart disease Father   ? Graves' disease Father   ? Colon cancer Father   ?     pt unsure of age onset, believes he was in his 82s  ? Gout Brother   ? Gout Brother   ? Cancer Brother   ? Stroke Brother   ? Diabetes Maternal Grandmother   ? Hypertension Maternal Grandmother   ? Heart attack Neg Hx   ? Esophageal cancer Neg Hx   ? Stomach cancer Neg Hx   ? Rectal cancer Neg Hx   ? ?Social History  ? ?Tobacco Use  ? Smoking status: Former  ?  Packs/day: 0.50  ?  Types: Cigarettes  ?  Passive exposure: Current  ? Smokeless tobacco: Never  ?Vaping Use  ? Vaping Use: Never used  ?Substance Use Topics  ? Alcohol use: No  ?  Alcohol/week: 0.0 standard  drinks  ? Drug use: No  ? ?Current Outpatient Medications  ?Medication Sig Dispense Refill  ? ALPRAZolam (XANAX) 1 MG tablet Take 1 mg by mouth 3 (three) times daily as needed for anxiety.    ? aspirin EC 81 MG tablet Take 81 mg by mouth daily. Swallow whole.    ? buPROPion (WELLBUTRIN XL) 300 MG 24 hr tablet Take 300 mg by mouth daily.    ? clopidogrel (PLAVIX) 75 MG tablet Take 1 tablet (75 mg total) by mouth daily. 30 tablet 11  ? cyclobenzaprine (FLEXERIL) 10 MG tablet Take 1 tablet (10 mg total) by mouth 3 (three) times daily as needed for muscle spasms. 21 tablet 0  ?  furosemide (LASIX) 40 MG tablet Take 40 mg by mouth daily.    ? ibuprofen (ADVIL) 200 MG tablet Take 400 mg by mouth every 6 (six) hours as needed for mild pain.    ? ipratropium-albuterol (DUONEB) 0.5-2.5 (3) MG/3ML SOLN Take 3 mLs by nebulization every 6 (six) hours as needed. (Patient taking differently: Take 3 mLs by nebulization every 6 (six) hours as needed (shortness of breath, wheezing).) 360 mL 1  ? isosorbide mononitrate (IMDUR) 30 MG 24 hr tablet Take 2 tablets (60 mg total) by mouth daily. 30 tablet 5  ? levothyroxine (SYNTHROID) 112 MCG tablet TAKE 1 TABLET BY MOUTH EVERY DAY IN THE MORNING 90 tablet 3  ? LINZESS 145 MCG CAPS capsule Take 145 mcg by mouth daily.    ? MELATONIN PO Take 1 tablet by mouth at bedtime as needed (sleep).    ? Methylsulfonylmethane (MSM) 1000 MG TABS Take 1 tablet by mouth daily.    ? nitroGLYCERIN (NITROSTAT) 0.3 MG SL tablet Place 0.3 mg under the tongue every 5 (five) minutes as needed for chest pain.  0  ? ondansetron (ZOFRAN) 4 MG tablet Take 1 tablet (4 mg total) by mouth every 8 (eight) hours as needed for nausea or vomiting. 8 tablet 0  ? OXYGEN Inhale into the lungs. As needed.    ? pantoprazole (PROTONIX) 40 MG tablet Take 1 tablet (40 mg total) by mouth daily. 30 tablet 0  ? rosuvastatin (CRESTOR) 20 MG tablet Take 1 tablet (20 mg total) by mouth daily. 90 tablet 3  ? SYMBICORT 160-4.5 MCG/ACT  inhaler Inhale 2 puffs into the lungs daily.    ? triamcinolone cream (KENALOG) 0.5 % Apply 1 application topically daily as needed (for sun exposure).    ? albuterol (PROVENTIL HFA;VENTOLIN HFA) 108 (90 Base) MC

## 2021-11-02 ENCOUNTER — Other Ambulatory Visit: Payer: Self-pay | Admitting: *Deleted

## 2021-11-02 DIAGNOSIS — I10 Essential (primary) hypertension: Secondary | ICD-10-CM | POA: Diagnosis not present

## 2021-11-02 DIAGNOSIS — E782 Mixed hyperlipidemia: Secondary | ICD-10-CM | POA: Diagnosis not present

## 2021-11-02 DIAGNOSIS — J441 Chronic obstructive pulmonary disease with (acute) exacerbation: Secondary | ICD-10-CM | POA: Diagnosis not present

## 2021-11-02 DIAGNOSIS — I5032 Chronic diastolic (congestive) heart failure: Secondary | ICD-10-CM | POA: Diagnosis not present

## 2021-11-02 DIAGNOSIS — K5909 Other constipation: Secondary | ICD-10-CM | POA: Diagnosis not present

## 2021-11-02 DIAGNOSIS — E039 Hypothyroidism, unspecified: Secondary | ICD-10-CM | POA: Diagnosis not present

## 2021-11-02 NOTE — Patient Outreach (Signed)
?[facility]) Care Management ?Telephonic RN Care Manager Note ? ? ?11/02/2021 ?Name:  Jocelyn [name] MRN:  [id number] DOB:  1961-06-22 ? ?Summary: ?Outgoing call placed to member, successful.  Denies any urgent concerns, encouraged to contact this care manager with questions.   ? ?Subjective: ?Jocelyn [name] is an 61 y.o. year old female who is a primary patient of Osei-Bonsu, Iona Beard, MD. The care management team was consulted for assistance with care management and/or care coordination needs.   ? ?Telephonic RN Care Manager completed Telephone Visit today. ? ?Objective:  ? ?Medications Reviewed Today   ? ? Reviewed by Elias Else, CMA (Certified Medical Assistant) on 11/01/21 at [phone]  Med List Status: <None>  ? ?Medication Order Taking? Sig Documenting Provider Last Dose Status Informant  ?albuterol (PROVENTIL HFA;VENTOLIN HFA) 108 (90 Base) MCG/ACT inhaler [id number]  Inhale 2 puffs into the lungs in the morning and at bedtime. [provider]  Expired 06/09/21 2359 Self  ?         ?Med Note Gar Ponto   Fri May 29, 2020 11:14 AM)    ?ALPRAZolam (XANAX) 1 MG tablet [id number]  Take 1 mg by mouth 3 (three) times daily as needed for anxiety. [provider]  Active Self  ?         ?Med Note Gar Ponto   Fri May 29, 2020 11:14 AM)    ?aspirin EC 81 MG tablet [id number]  Take 81 mg by mouth daily. Swallow whole. [provider]  Active   ?buPROPion (WELLBUTRIN XL) 300 MG 24 hr tablet [id number]  Take 300 mg by mouth daily. [provider]  Active Self  ?clopidogrel (PLAVIX) 75 MG tablet [id number]  Take 1 tablet (75 mg total) by mouth daily. [name], MD  Active   ?cyclobenzaprine (FLEXERIL) 10 MG tablet [id number]  Take 1 tablet (10 mg total) by mouth 3 (three) times daily as needed for muscle spasms. [name], NP  Active Self  ?furosemide (LASIX) 40 MG tablet [id number]  Take 40 mg by mouth daily. [provider]  Active Self   ?ibuprofen (ADVIL) 200 MG tablet [id number]  Take 400 mg by mouth every 6 (six) hours as needed for mild pain. [provider]  Active Self  ?ipratropium-albuterol (DUONEB) 0.5-2.5 (3) MG/3ML SOLN [id number]  Take 3 mLs by nebulization every 6 (six) hours as needed.  ?Patient taking differently: Take 3 mLs by nebulization every 6 (six) hours as needed (shortness of breath, wheezing).  ? [name], [name], MD  Active Self  ?isosorbide mononitrate (IMDUR) 30 MG 24 hr tablet [id number]  Take 2 tablets (60 mg total) by mouth daily. [name], MD  Active Self  ?         ?Med Note Gar Ponto   Fri May 29, 2020 11:14 AM)    ?levothyroxine (SYNTHROID) 112 MCG tablet [id number]  TAKE 1 TABLET BY MOUTH EVERY DAY IN THE MORNING [name], MD  Active   ?LINZESS 145 MCG CAPS capsule [id number]  Take 145 mcg by mouth daily. [provider]  Active Self  ?MELATONIN PO [id number]  Take 1 tablet by mouth at bedtime as needed (sleep). [provider]  Active Self  ?Methylsulfonylmethane (MSM) 1000 MG TABS [id number]  Take 1 tablet by mouth daily. [provider]  Active   ?nitroGLYCERIN (NITROSTAT) 0.3 MG SL tablet [id number]  Place 0.3 mg under the tongue every 5 (five) minutes as needed for  chest pain. [provider]  Active Self  ?         ?Med Note Belva Agee   IRC Jul 16, 2016  5:59 PM)    ?ondansetron (ZOFRAN) 4 MG tablet [id number]  Take 1 tablet (4 mg total) by mouth every 8 (eight) hours as needed for nausea or vomiting. Quintella Reichert, MD  Active   ?pantoprazole (PROTONIX) 40 MG tablet [id number]  Take 1 tablet (40 mg total) by mouth daily. Quintella Reichert, MD  Active   ?rosuvastatin (CRESTOR) 20 MG tablet [id number]  Take 1 tablet (20 mg total) by mouth daily. [name], MD  Active   ?SYMBICORT 160-4.5 MCG/ACT inhaler [id number]  Inhale 2 puffs into the lungs daily. [provider]  Active   ?triamcinolone cream (KENALOG) 0.5 % [id number]  Apply  1 application topically daily as needed (for sun exposure). [provider]  Active Self  ? ?  ?  ? ?  ? ? ? ?SDOH:  (Social Determinants of Health) assessments and interventions performed:  ? ? ? ?Care Plan ? ?Review of patient past medical history, allergies, medications, health status, including review of consultants reports, laboratory and other test data, was performed as part of comprehensive evaluation for care management services.  ? ?Care Plan : [facility] Plan of Care (Adult)  ?Updates made by [name], RN since 11/02/2021 12:00 AM  ?  ? ?Problem: Care Coordination needs and assistance with managing chronic medical conditions (COPD & Stroke)   ?Priority: High  ?  ? ?Long-Range Goal: Adequate management of chronic medical conditions (COPD & Stroke)   ?Start Date: 06/22/2021  ?Expected End Date: 12/19/2021  ?This Visit's Progress: On track  ?Recent Progress: On track  ?Priority: High  ?Note:   ?Current Barriers:  ?Knowledge Deficits related to plan of care for management of COPD, Tobacco Use, and Stroke ?Care Coordination needs related to Mental Health Concerns  Has depression post stroke ? ?RNCM Clinical Goal(s):  ?Patient will verbalize understanding of plan for management of COPD, Tobacco Use, and Stroke ?take all medications exactly as prescribed and will call provider for medication related questions ?attend all scheduled medical appointments: Cardiology, office called to request follow up with assigned provider, Dr. Irish Lack ?continue to work with RN Care Manager to address care management and care coordination needs related to  COPD, Tobacco Use, and Stroke ?work with Education officer, museum to address  related to the management of [place] Concerns  related to the management of COPD, Tobacco Use, and Stroke  through collaboration with Consulting civil engineer, provider, and care team.  ? ?Interventions: ?Inter-disciplinary care team collaboration (see longitudinal plan of care) ?Evaluation of current treatment  plan related to  self management and patient's adherence to plan as established by provider ? ?StrokeGoal on track:  Yes. ?Evaluation of current treatment plan related to Tobacco Use and Stroke , Mental Health Concerns  self-management and patient's adherence to plan as established by provider. ?Discussed plans with patient for ongoing care management follow up and provided patient with direct contact information for care management team ?Provided education to patient re: smoking cessation and increased risk for recurrent stroke; ?Reviewed medications with patient and discussed notifying MD with changes; ?Social Work referral for depression, elevated PHQ9; ? ?COPD Interventions:   ?Advised patient to track and manage COPD triggers;  ?Discussed the importance of adequate rest and management of fatigue with COPD; ?Screening for signs and symptoms of depression related to chronic disease state;  ? ?Patient  Goals/Self-Care Activities: ?Patient will self administer medications as prescribed ?Patient will attend all scheduled provider appointments ?Patient will continue to perform ADL's independently ?Patient will continue to perform IADL's independently ? ?Follow Up Plan:  Telephone follow up appointment with care management team member scheduled for:  11/29/2021 ?The patient has been provided with contact information for the care management team and has been advised to call with any health related questions or concerns.  ? ? ?Update 11/29 - Red EMMI alert received today, denies complaints.  State she is improving since starting outpatient speech therapy. ? ? ?Update 12/6 - Spoke with member and daughter, they both report member is recovering well.  She has appointment with psych for depression/anxiety on 1/2, will continue follow up with CSW until then.  They are concerned about her blood pressure ranging 90-100s/60's, stopped taking Metoprolol.  She remains on Lasix and Imdur.  Conference call placed to cardiology office  to follow up, unable to speak with office due to hold time but office will call member directly to schedule follow up appointment for concerns.   ? ? ?Update 1/3 - Member report she is still recovering

## 2021-11-05 NOTE — Progress Notes (Signed)
Carelink Summary Report / Loop Recorder 

## 2021-11-28 LAB — CUP PACEART REMOTE DEVICE CHECK
Date Time Interrogation Session: 20230428230528
Implantable Pulse Generator Implant Date: 20221110

## 2021-11-29 ENCOUNTER — Ambulatory Visit (INDEPENDENT_AMBULATORY_CARE_PROVIDER_SITE_OTHER): Payer: Medicare Other

## 2021-11-29 DIAGNOSIS — I639 Cerebral infarction, unspecified: Secondary | ICD-10-CM

## 2021-11-30 ENCOUNTER — Other Ambulatory Visit: Payer: Self-pay | Admitting: *Deleted

## 2021-11-30 NOTE — Patient Outreach (Signed)
?[facility]) Care Management ?Telephonic RN Care Manager Note ? ? ?11/30/2021 ?Name:  Jocelyn [name] MRN:  [id number] DOB:  Nov 09, 1960 ? ?Summary: ?Outgoing call placed to member, successful.  Denies any urgent concerns, encouraged to contact this care manager with questions.   ? ? ?Subjective: ?Jocelyn [name] is an 61 y.o. year old female who is a primary patient of Osei-Bonsu, Iona Beard, MD. The care management team was consulted for assistance with care management and/or care coordination needs.   ? ?Telephonic RN Care Manager completed Telephone Visit today. ? ?Objective:  ? ?Medications Reviewed Today   ? ? Reviewed by Elias Else, CMA (Certified Medical Assistant) on 11/01/21 at [phone]  Med List Status: <None>  ? ?Medication Order Taking? Sig Documenting Provider Last Dose Status Informant  ?albuterol (PROVENTIL HFA;VENTOLIN HFA) 108 (90 Base) MCG/ACT inhaler [id number]  Inhale 2 puffs into the lungs in the morning and at bedtime. [provider]  Expired 06/09/21 2359 Self  ?         ?Med Note Gar Ponto   Fri May 29, 2020 11:14 AM)    ?ALPRAZolam (XANAX) 1 MG tablet [id number]  Take 1 mg by mouth 3 (three) times daily as needed for anxiety. [provider]  Active Self  ?         ?Med Note Gar Ponto   Fri May 29, 2020 11:14 AM)    ?aspirin EC 81 MG tablet [id number]  Take 81 mg by mouth daily. Swallow whole. [provider]  Active   ?buPROPion (WELLBUTRIN XL) 300 MG 24 hr tablet [id number]  Take 300 mg by mouth daily. [provider]  Active Self  ?clopidogrel (PLAVIX) 75 MG tablet [id number]  Take 1 tablet (75 mg total) by mouth daily. [name], MD  Active   ?cyclobenzaprine (FLEXERIL) 10 MG tablet [id number]  Take 1 tablet (10 mg total) by mouth 3 (three) times daily as needed for muscle spasms. [name], NP  Active Self  ?furosemide (LASIX) 40 MG tablet [id number]  Take 40 mg by mouth daily. [provider]  Active Self   ?ibuprofen (ADVIL) 200 MG tablet [id number]  Take 400 mg by mouth every 6 (six) hours as needed for mild pain. [provider]  Active Self  ?ipratropium-albuterol (DUONEB) 0.5-2.5 (3) MG/3ML SOLN [id number]  Take 3 mLs by nebulization every 6 (six) hours as needed.  ?Patient taking differently: Take 3 mLs by nebulization every 6 (six) hours as needed (shortness of breath, wheezing).  ? [name], [name], MD  Active Self  ?isosorbide mononitrate (IMDUR) 30 MG 24 hr tablet [id number]  Take 2 tablets (60 mg total) by mouth daily. [name], MD  Active Self  ?         ?Med Note Gar Ponto   Fri May 29, 2020 11:14 AM)    ?levothyroxine (SYNTHROID) 112 MCG tablet [id number]  TAKE 1 TABLET BY MOUTH EVERY DAY IN THE MORNING [name], MD  Active   ?LINZESS 145 MCG CAPS capsule [id number]  Take 145 mcg by mouth daily. [provider]  Active Self  ?MELATONIN PO [id number]  Take 1 tablet by mouth at bedtime as needed (sleep). [provider]  Active Self  ?Methylsulfonylmethane (MSM) 1000 MG TABS [id number]  Take 1 tablet by mouth daily. [provider]  Active   ?nitroGLYCERIN (NITROSTAT) 0.3 MG SL tablet [id number]  Place 0.3 mg under the tongue every 5 (five) minutes as needed  for chest pain. [provider]  Active Self  ?         ?Med Note Belva Agee   [facility] Jul 16, 2016  5:59 PM)    ?ondansetron (ZOFRAN) 4 MG tablet [id number]  Take 1 tablet (4 mg total) by mouth every 8 (eight) hours as needed for nausea or vomiting. Quintella Reichert, MD  Active   ?pantoprazole (PROTONIX) 40 MG tablet [id number]  Take 1 tablet (40 mg total) by mouth daily. Quintella Reichert, MD  Active   ?rosuvastatin (CRESTOR) 20 MG tablet [id number]  Take 1 tablet (20 mg total) by mouth daily. [name], MD  Active   ?SYMBICORT 160-4.5 MCG/ACT inhaler [id number]  Inhale 2 puffs into the lungs daily. [provider]  Active   ?triamcinolone cream (KENALOG) 0.5 % [id number]  Apply  1 application topically daily as needed (for sun exposure). [provider]  Active Self  ? ?  ?  ? ?  ? ? ? ?SDOH:  (Social Determinants of Health) assessments and interventions performed:  ? ? ? ?Care Plan ? ?Review of patient past medical history, allergies, medications, health status, including review of consultants reports, laboratory and other test data, was performed as part of comprehensive evaluation for care management services.  ? ?Care Plan : [facility] Plan of Care (Adult)  ?Updates made by [name], RN since 11/30/2021 12:00 AM  ?  ? ?Problem: Care Coordination needs and assistance with managing chronic medical conditions (COPD & Stroke)   ?Priority: High  ?  ? ?Long-Range Goal: Adequate management of chronic medical conditions (COPD & Stroke)   ?Start Date: 06/22/2021  ?Expected End Date: 12/19/2021  ?This Visit's Progress: On track  ?Recent Progress: On track  ?Priority: High  ?Note:   ?Current Barriers:  ?Knowledge Deficits related to plan of care for management of COPD, Tobacco Use, and Stroke ?Care Coordination needs related to Mental Health Concerns  Has depression post stroke ? ?RNCM Clinical Goal(s):  ?Patient will verbalize understanding of plan for management of COPD, Tobacco Use, and Stroke ?take all medications exactly as prescribed and will call provider for medication related questions ?attend all scheduled medical appointments: Cardiology, office called to request follow up with assigned provider, Dr. Irish Lack ?continue to work with RN Care Manager to address care management and care coordination needs related to  COPD, Tobacco Use, and Stroke ?work with Education officer, museum to address  related to the management of [place] Concerns  related to the management of COPD, Tobacco Use, and Stroke  through collaboration with Consulting civil engineer, provider, and care team.  ? ?Interventions: ?Inter-disciplinary care team collaboration (see longitudinal plan of care) ?Evaluation of current treatment  plan related to  self management and patient's adherence to plan as established by provider ? ?StrokeGoal on track:  Yes. ?Evaluation of current treatment plan related to Tobacco Use and Stroke , Mental Health Concerns  self-management and patient's adherence to plan as established by provider. ?Discussed plans with patient for ongoing care management follow up and provided patient with direct contact information for care management team ?Provided education to patient re: smoking cessation and increased risk for recurrent stroke; ?Reviewed medications with patient and discussed notifying MD with changes; ?Social Work referral for depression, elevated PHQ9; ? ?COPD Interventions:   ?Advised patient to track and manage COPD triggers;  ?Discussed the importance of adequate rest and management of fatigue with COPD; ?Screening for signs and symptoms of depression related to chronic disease state;  ? ?  Patient Goals/Self-Care Activities: ?Patient will self administer medications as prescribed ?Patient will attend all scheduled provider appointments ?Patient will continue to perform ADL's independently ?Patient will continue to perform IADL's independently ? ?Follow Up Plan:  Telephone follow up appointment with care management team member scheduled for:  12/30/2021 ?The patient has been provided with contact information for the care management team and has been advised to call with any health related questions or concerns.  ? ? ?Update 11/29 - Red EMMI alert received today, denies complaints.  State she is improving since starting outpatient speech therapy. ? ? ?Update 12/6 - Spoke with member and daughter, they both report member is recovering well.  She has appointment with psych for depression/anxiety on 1/2, will continue follow up with CSW until then.  They are concerned about her blood pressure ranging 90-100s/60's, stopped taking Metoprolol.  She remains on Lasix and Imdur.  Conference call placed to cardiology office  to follow up, unable to speak with office due to hold time but office will call member directly to schedule follow up appointment for concerns.   ? ? ?Update 1/3 - Member report she is still recoveri

## 2021-12-04 IMAGING — DX DG CHEST 1V PORT
1 series · 1 of 1 positions shown · non-contrast
Comparison: May 16, 2020

CLINICAL DATA: Cough and shortness of breath.

EXAM:
PORTABLE CHEST 1 VIEW

[chest ap]
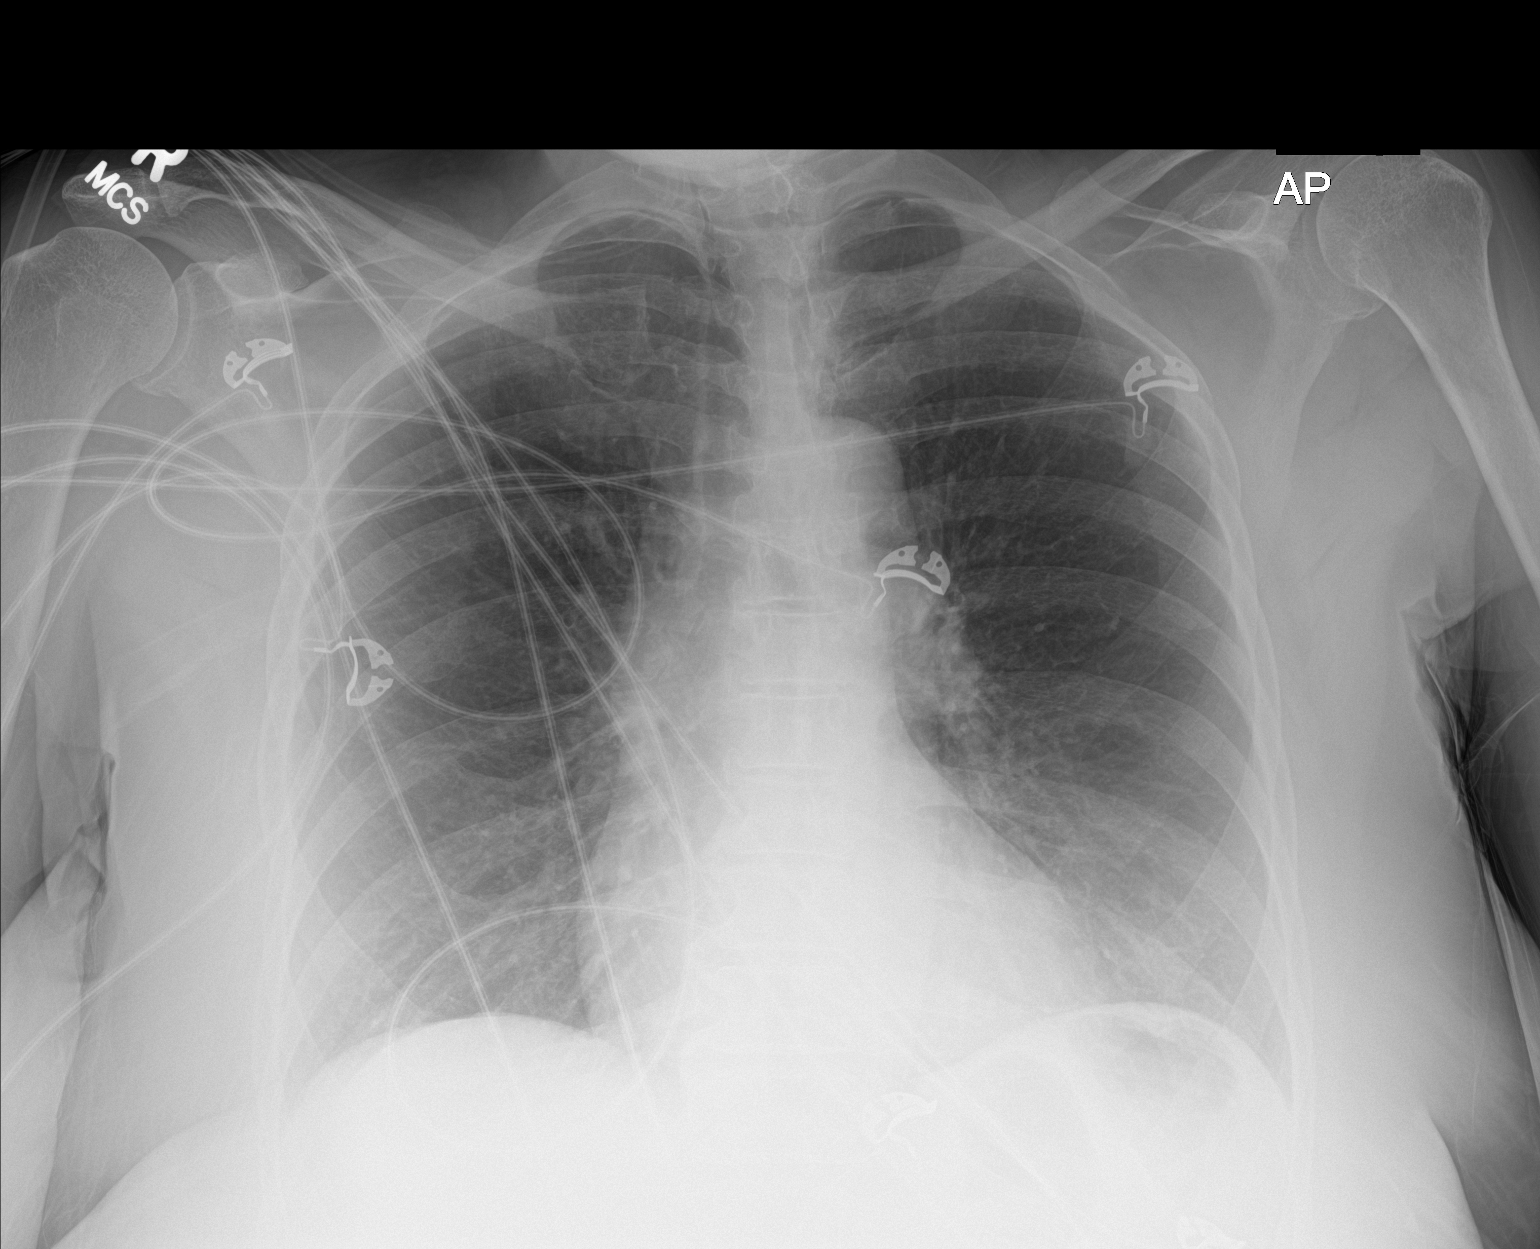

[1 of 1 positions shown; findings below may reference images not displayed]

FINDINGS: The heart size and mediastinal contours are within normal limits. No
focal consolidation. No visible pleural effusion or pneumothorax.
The visualized skeletal structures are unremarkable.
IMPRESSION: No acute cardiopulmonary disease.

## 2021-12-05 IMAGING — CT CT ANGIO CHEST
2 of 7 series · 18 of 46 positions shown · IV contrast (APPLIED)
Comparison: Chest x-ray from the previous day.

CLINICAL DATA: Shortness of breath and positive D-dimer

EXAM:
CT ANGIOGRAPHY CHEST WITH CONTRAST
TECHNIQUE: Multidetector CT imaging of the chest was performed using the
standard protocol during bolus administration of intravenous
contrast. Multiplanar CT image reconstructions and MIPs were
obtained to evaluate the vascular anatomy.
CONTRAST:  80mL OMNIPAQUE IOHEXOL 350 MG/ML SOLN

[Series 7: thins · axial · 0.79mm/px · z∈[+1136,+1419]mm · 15 of 457 slices shown]
[im 26/457  lung]
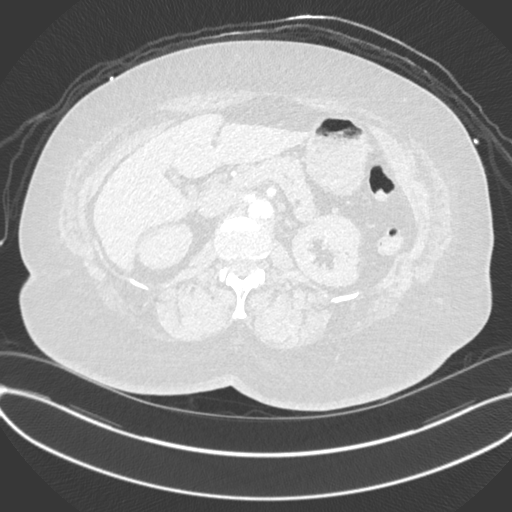
[im 51/457  soft-tissue]
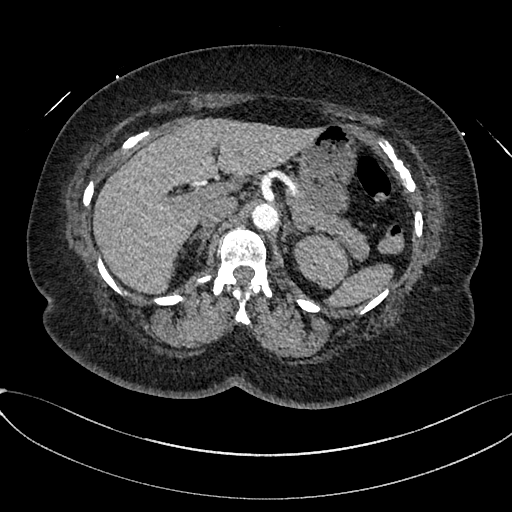
[im 77/457  lung]
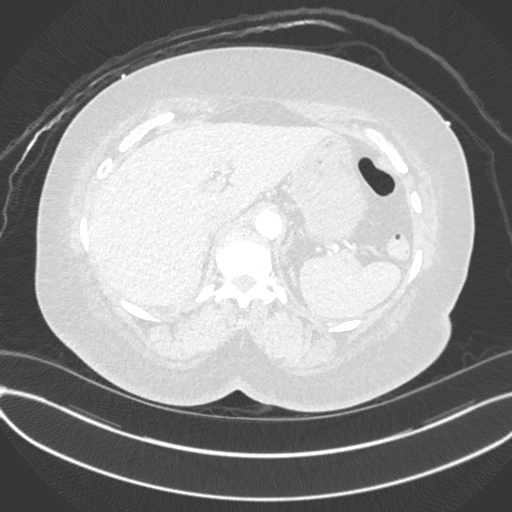
[im 102/457  soft-tissue]
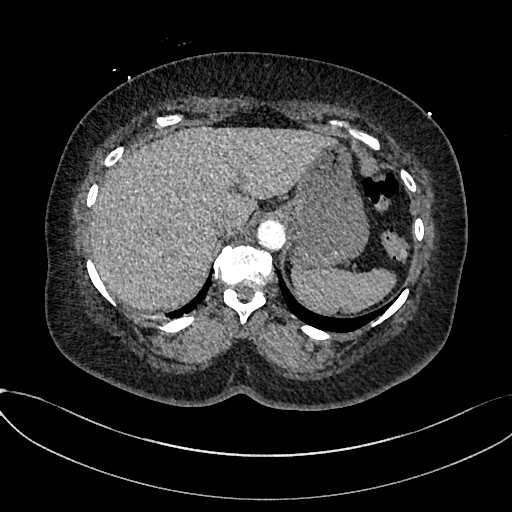
[im 153/457  lung]
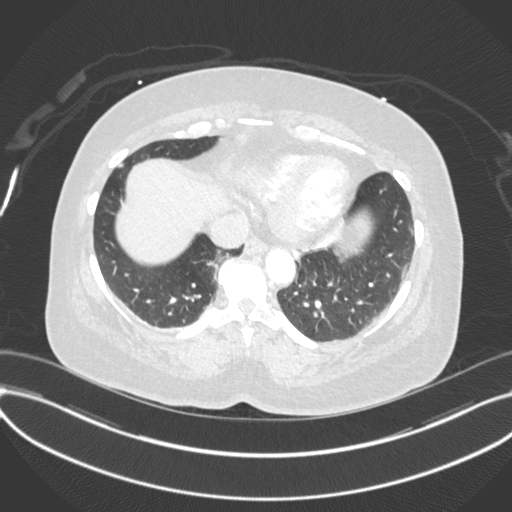
[im 178/457  soft-tissue]
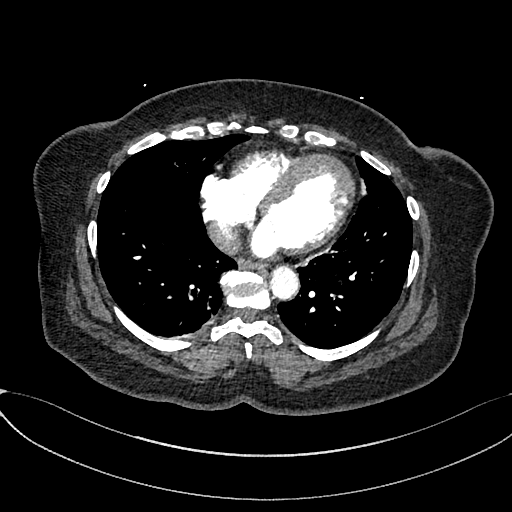
[im 203/457  lung]
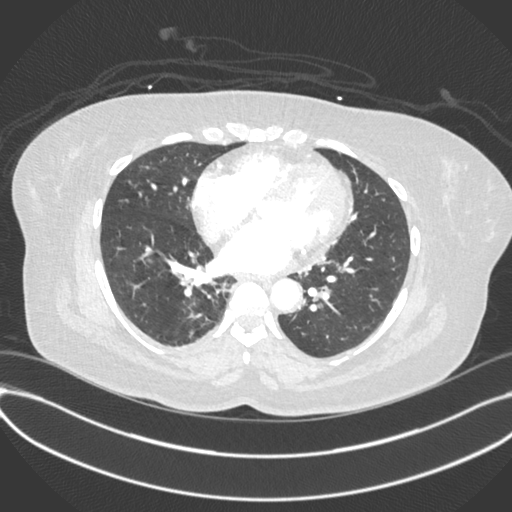
[im 229/457  soft-tissue]
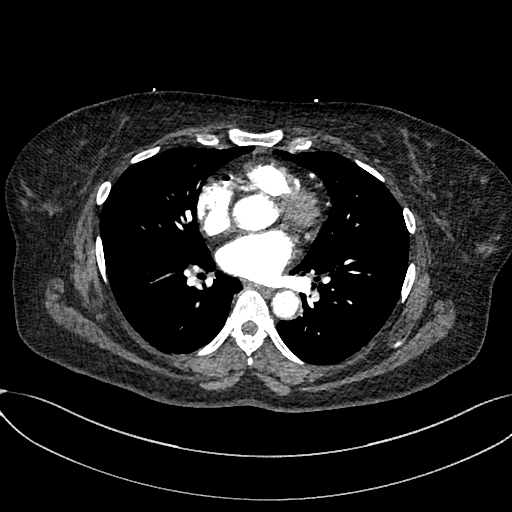
[im 254/457  lung]
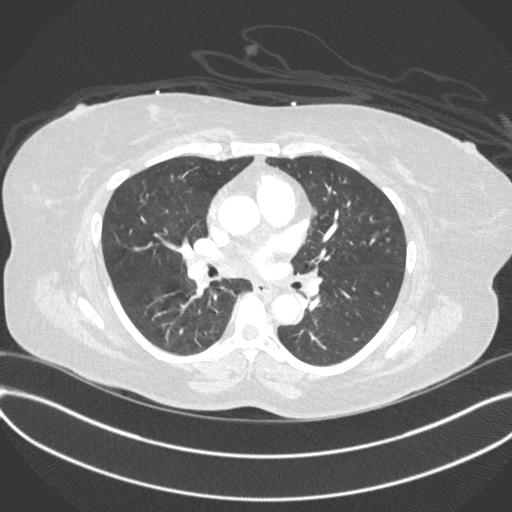
[im 279/457  soft-tissue]
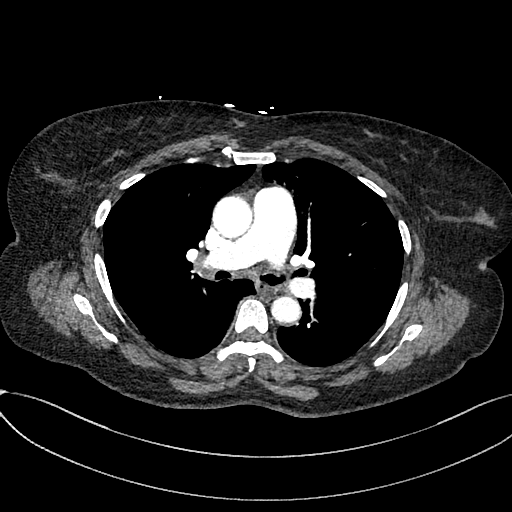
[im 305/457  lung]
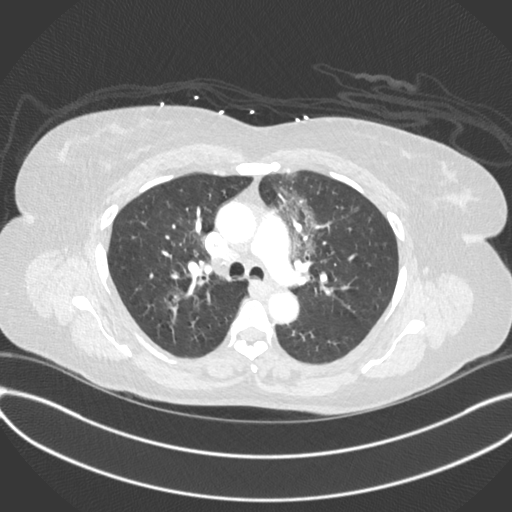
[im 355/457  soft-tissue]
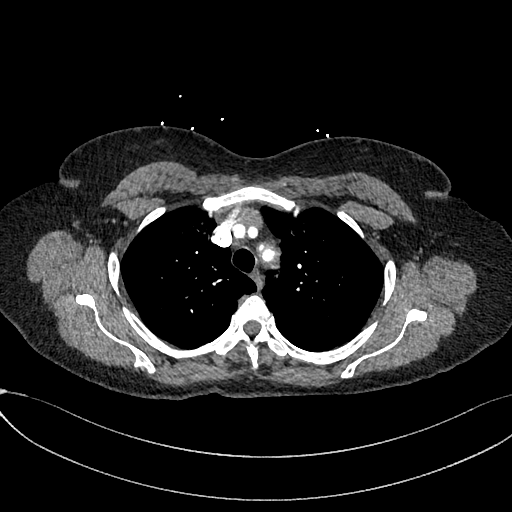
[im 381/457  lung]
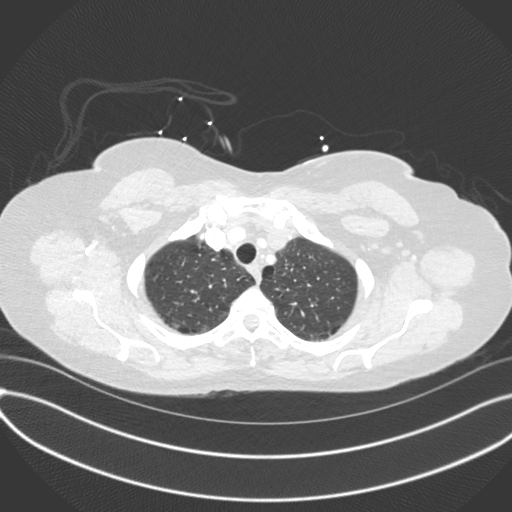
[im 406/457  soft-tissue]
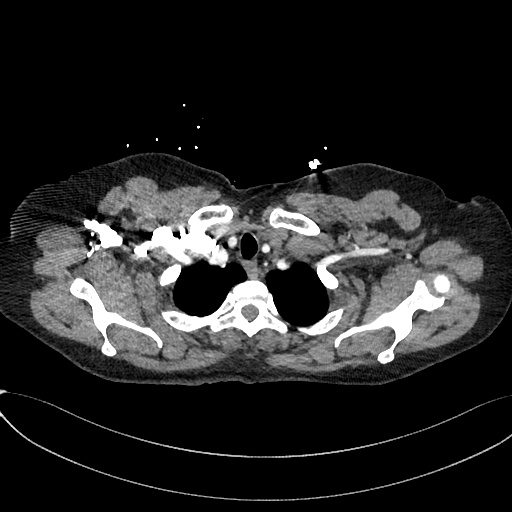
[im 431/457  lung]
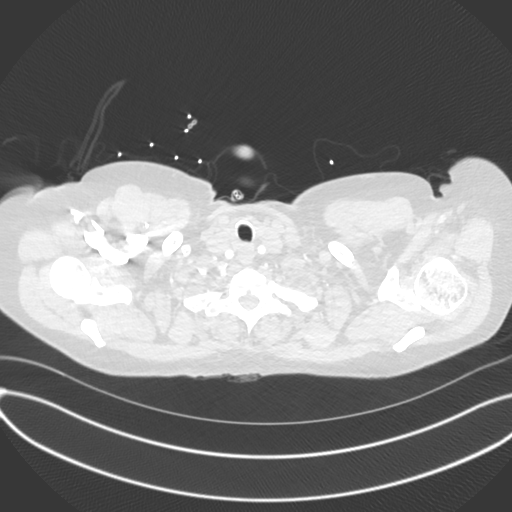

[Series 8: cor · coronal · 0.63mm/px · 3 of 154 slices shown]
[im 39/154  soft-tissue]
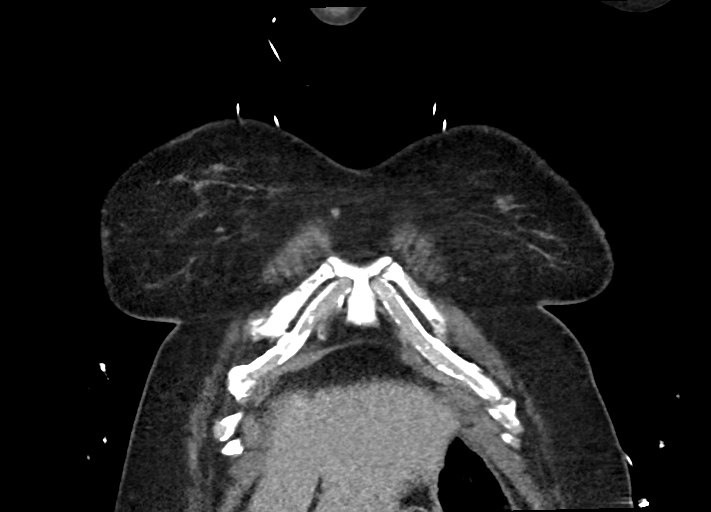
[im 77/154  soft-tissue]
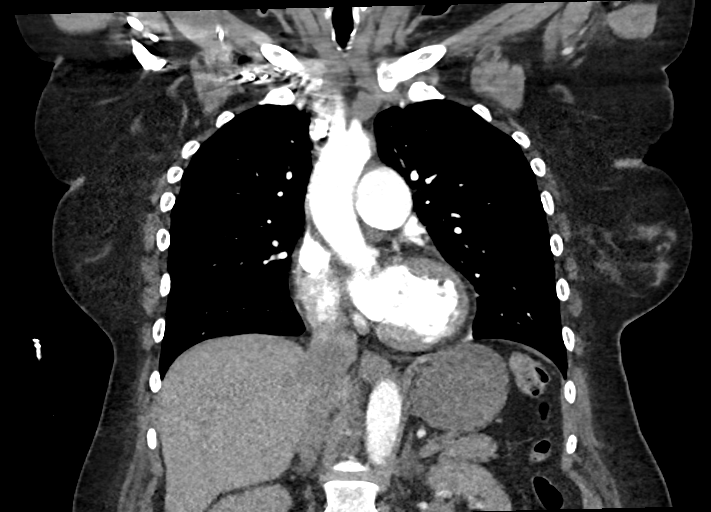
[im 115/154  soft-tissue]
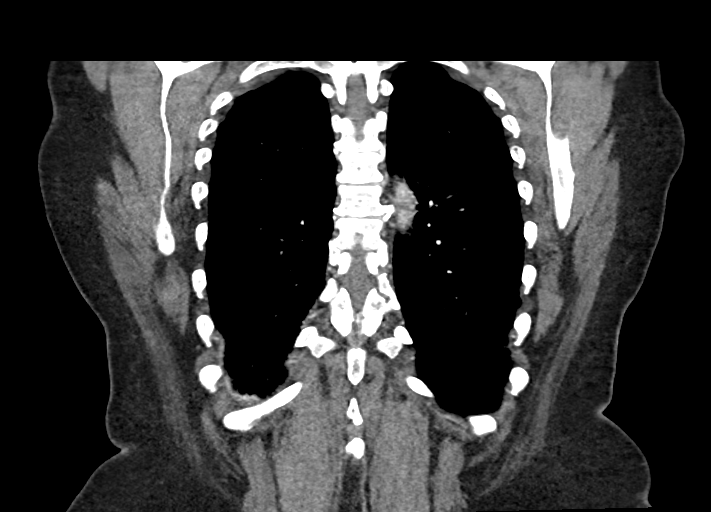

[18 of 46 positions shown; findings below may reference images not displayed]

FINDINGS: Cardiovascular: Thoracic aorta and its branches demonstrate
atherosclerotic calcification without aneurysmal dilatation or
dissection. Pulmonary artery shows a normal branching pattern. No
filling defect to suggest pulmonary embolism is noted. Mild coronary
calcifications are seen. No cardiac enlargement is noted.

Mediastinum/Nodes: Thoracic inlet is within normal limits. The
esophagus as visualized is within normal limits. No sizable hilar or
mediastinal adenopathy is noted.

Lungs/Pleura: Lungs are well aerated bilaterally. Mild emphysematous
changes are seen. Patchy ground-glass airspace opacities are noted
bilaterally predominately within the upper lobes consistent with
postinflammatory change. No sizable effusion is noted. No sizable
parenchymal nodules are seen.

Upper Abdomen: Visualized upper abdomen shows no acute abnormality.

Musculoskeletal: Degenerative changes of the thoracic spine are
noted. No acute rib abnormality is noted.

Review of the MIP images confirms the above findings.
IMPRESSION: No evidence of pulmonary emboli.

Patchy ground-glass opacities consistent with a postinflammatory
nature predominately within the upper lobes. No other focal
abnormality is seen.

## 2021-12-08 DIAGNOSIS — J028 Acute pharyngitis due to other specified organisms: Secondary | ICD-10-CM | POA: Diagnosis not present

## 2021-12-08 DIAGNOSIS — R051 Acute cough: Secondary | ICD-10-CM | POA: Diagnosis not present

## 2021-12-12 ENCOUNTER — Other Ambulatory Visit: Payer: Self-pay

## 2021-12-12 ENCOUNTER — Emergency Department (HOSPITAL_COMMUNITY): Payer: Medicare Other

## 2021-12-12 ENCOUNTER — Encounter (HOSPITAL_COMMUNITY): Payer: Self-pay

## 2021-12-12 ENCOUNTER — Emergency Department (HOSPITAL_COMMUNITY)
Admission: EM | Admit: 2021-12-12 | Discharge: 2021-12-13 | Disposition: A | Discharge status: Home / Self Care | Payer: Medicare Other | Attending: Emergency Medicine | Admitting: Emergency Medicine

## 2021-12-12 DIAGNOSIS — R0602 Shortness of breath: Secondary | ICD-10-CM | POA: Diagnosis not present

## 2021-12-12 DIAGNOSIS — R062 Wheezing: Secondary | ICD-10-CM | POA: Insufficient documentation

## 2021-12-12 DIAGNOSIS — R0981 Nasal congestion: Secondary | ICD-10-CM | POA: Diagnosis not present

## 2021-12-12 DIAGNOSIS — Z7982 Long term (current) use of aspirin: Secondary | ICD-10-CM | POA: Diagnosis not present

## 2021-12-12 DIAGNOSIS — R079 Chest pain, unspecified: Secondary | ICD-10-CM | POA: Diagnosis not present

## 2021-12-12 DIAGNOSIS — E876 Hypokalemia: Secondary | ICD-10-CM | POA: Diagnosis not present

## 2021-12-12 DIAGNOSIS — Z7902 Long term (current) use of antithrombotics/antiplatelets: Secondary | ICD-10-CM | POA: Insufficient documentation

## 2021-12-12 NOTE — ED Triage Notes (Signed)
Pt arrives POV c/o SHOB, left sided chest pain, and congestion. 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 Pt has hx of  CVA and COPD. ?

## 2021-12-12 NOTE — ED Provider Triage Note (Signed)
Emergency Medicine Provider Triage Evaluation Note ? ?Jocelyn [name] , a 61 y.o. female  was evaluated in triage.  Pt complains of left sided chest pain and shortness of breath since yesterday.  She has been feeling poorly for a week and was started on medication for strep per her report. No leg swelling ? ? ? ?Physical Exam  ?BP 126/66   Pulse 87   Temp 98.3 ?F (36.8 ?C) (Oral)   Resp 20   Ht '5\' 4"'$  (1.626 m)   Wt 99.8 kg   SpO2 99%   BMI 37.76 kg/m?  ?Gen:   Awake, no distress   ?Resp:  Normal effort  ?MSK:   Moves extremities without difficulty  ?Other:  Normal speech ? ?Medical Decision Making  ?Medically screening exam initiated at 11:39 PM.  Appropriate orders placed.  Jocelyn [name] was informed that the remainder of the evaluation will be completed by another provider, this initial triage assessment does not replace that evaluation, and the importance of remaining in the ED until their evaluation is complete. ? ? ?  ?Lorin Glass, PA-C ?12/12/21 2340 ? ?

## 2021-12-13 DIAGNOSIS — R0602 Shortness of breath: Secondary | ICD-10-CM | POA: Diagnosis not present

## 2021-12-13 LAB — COMPREHENSIVE METABOLIC PANEL
ALT: 16 U/L (ref 0–44)
AST: 19 U/L (ref 15–41)
Albumin: 3.4 g/dL — ABNORMAL LOW (ref 3.5–5.0)
Alkaline Phosphatase: 62 U/L (ref 38–126)
Anion gap: 8 (ref 5–15)
BUN: 8 mg/dL (ref 6–20)
CO2: 27 mmol/L (ref 22–32)
Calcium: 9.4 mg/dL (ref 8.9–10.3)
Chloride: 104 mmol/L (ref 98–111)
Creatinine, Ser: 0.89 mg/dL (ref 0.44–1.00)
GFR, Estimated: >60 mL/min (ref >60)
Glucose, Bld: 114 mg/dL — ABNORMAL HIGH (ref 70–99)
Potassium: 2.8 mmol/L — ABNORMAL LOW (ref 3.5–5.1)
Sodium: 139 mmol/L (ref 135–145)
Total Bilirubin: 0.7 mg/dL (ref 0.3–1.2)
Total Protein: 6.7 g/dL (ref 6.5–8.1)

## 2021-12-13 LAB — CBC WITH DIFFERENTIAL/PLATELET
Abs Immature Granulocytes: 0.01 10*3/uL (ref 0.00–0.07)
Basophils Absolute: 0 10*3/uL (ref 0.0–0.1)
Basophils Relative: 0 %
Eosinophils Absolute: 0.1 10*3/uL (ref 0.0–0.5)
Eosinophils Relative: 1 %
HCT: 40.1 % (ref 36.0–46.0)
Hemoglobin: 13.2 g/dL (ref 12.0–15.0)
Immature Granulocytes: 0 %
Lymphocytes Relative: 52 %
Lymphs Abs: 4.4 10*3/uL — ABNORMAL HIGH (ref 0.7–4.0)
MCH: 28.6 pg (ref 26.0–34.0)
MCHC: 32.9 g/dL (ref 30.0–36.0)
MCV: 86.8 fL (ref 80.0–100.0)
Monocytes Absolute: 0.5 10*3/uL (ref 0.1–1.0)
Monocytes Relative: 6 %
Neutro Abs: 3.5 10*3/uL (ref 1.7–7.7)
Neutrophils Relative %: 41 %
Platelets: 282 10*3/uL (ref 150–400)
RBC: 4.62 MIL/uL (ref 3.87–5.11)
RDW: 13.1 % (ref 11.5–15.5)
WBC: 8.5 10*3/uL (ref 4.0–10.5)
nRBC: 0 % (ref 0.0–0.2)

## 2021-12-13 LAB — URINALYSIS, ROUTINE W REFLEX MICROSCOPIC
Bilirubin Urine: NEGATIVE
Glucose, UA: NEGATIVE mg/dL
Hgb urine dipstick: NEGATIVE
Ketones, ur: NEGATIVE mg/dL
Leukocytes,Ua: NEGATIVE
Nitrite: NEGATIVE
Protein, ur: NEGATIVE mg/dL
Specific Gravity, Urine: 1.008 (ref 1.005–1.030)
pH: 6 (ref 5.0–8.0)

## 2021-12-13 LAB — TROPONIN I (HIGH SENSITIVITY)
Troponin I (High Sensitivity): 10 ng/L (ref <18)
Troponin I (High Sensitivity): 10 ng/L (ref <18)

## 2021-12-13 LAB — LIPASE, BLOOD: Lipase: 26 U/L (ref 11–51)

## 2021-12-13 LAB — BRAIN NATRIURETIC PEPTIDE: B Natriuretic Peptide: 59.4 pg/mL (ref 0.0–100.0)

## 2021-12-13 MED ORDER — POTASSIUM CHLORIDE CRYS ER 20 MEQ PO TBCR: 40.0000 meq | EXTENDED_RELEASE_TABLET | Freq: Every day | ORAL | 0 refills | Status: DC

## 2021-12-13 MED ORDER — POTASSIUM CHLORIDE CRYS ER 20 MEQ PO TBCR
80.0000 meq | EXTENDED_RELEASE_TABLET | Freq: Once | ORAL | Status: AC
  Administered 2021-12-13: 80 meq via ORAL
  Filled 2021-12-13: qty 4

## 2021-12-13 MED ORDER — DEXAMETHASONE 4 MG PO TABS
16.0000 mg | ORAL_TABLET | Freq: Once | ORAL | Status: AC
  Administered 2021-12-13: 16 mg via ORAL
  Filled 2021-12-13: qty 4

## 2021-12-13 NOTE — ED Notes (Signed)
Patient verbalizes understanding of d/c instructions. Opportunities for questions and answers were provided. Pt d/c from ED and ambulated to lobby.  

## 2021-12-13 NOTE — ED Provider Notes (Signed)
?[facility] ?Provider Note ? ? ?CSN: [id number] ?Arrival date & time: 12/12/21  2309 ? ?  ? ?History ? ?Chief Complaint  ?Patient presents with  ? Shortness of Breath  ? Chest Pain  ? ? ?Jocelyn [name] is a 61 y.o. female. ? ?61 yo F who presents to the ED tonight with dyspnea and chest pain. Also associated with gas and congestion.  Recently treated for strep throat with azithromycin and prednisone but finished these a few days ago.  She states that she tried to use an enema to see if her nasal congestion could be evacuated via that route.  It did not work.  No actual constipation.  She has an inhaler and nebulizer at home but has not tried using either 1 of those for her shortness of breath.  She is afebrile at this time.  Just coughing up white mucus.  She is compliant with her medications. ? ? ?Shortness of Breath ?Associated symptoms: chest pain   ?Chest Pain ?Associated symptoms: shortness of breath   ? ?  ? ?Home Medications ?Prior to Admission medications   ?Medication Sig Start Date End Date Taking? Authorizing Provider  ?albuterol (PROVENTIL HFA;VENTOLIN HFA) 108 (90 Base) MCG/ACT inhaler Inhale 2 puffs into the lungs every 6 (six) hours as needed for wheezing or shortness of breath. 06/21/15 12/13/21 Yes [provider]  ?ALPRAZolam [name]) 1 MG tablet Take 1 mg by mouth daily. 05/10/20  Yes [provider]  ?aspirin EC 81 MG tablet Take 81 mg by mouth daily. Swallow whole.   Yes [provider]  ?clopidogrel (PLAVIX) 75 MG tablet Take 1 tablet (75 mg total) by mouth daily. 06/10/21 06/10/22 Yes [name], MD  ?furosemide (LASIX) 40 MG tablet Take 40 mg by mouth daily.   Yes [provider]  ?gabapentin (NEURONTIN) 300 MG capsule Take 300 mg by mouth at bedtime as needed (pain). 10/05/21  Yes [provider]  ?ibuprofen (ADVIL) 200 MG tablet Take 400 mg by mouth every 6 (six) hours as needed for mild pain.   Yes  [provider]  ?ipratropium-albuterol (DUONEB) 0.5-2.5 (3) MG/3ML SOLN Take 3 mLs by nebulization every 6 (six) hours as needed. ?Patient taking differently: Take 3 mLs by nebulization every 6 (six) hours as needed (shortness of breath, wheezing). 05/21/21  Yes [name], [name], MD  ?isosorbide mononitrate (IMDUR) 30 MG 24 hr tablet Take 2 tablets (60 mg total) by mouth daily. ?Patient taking differently: Take 30 mg by mouth daily. 07/17/16  Yes [name], MD  ?levothyroxine (SYNTHROID) 100 MCG tablet Take 100 mcg by mouth daily. 11/02/21  Yes [provider]  ?LINZESS 145 MCG CAPS capsule Take 145 mcg by mouth daily as needed (constipation). 04/30/20  Yes [provider]  ?MELATONIN PO Take 10-20 mg by mouth at bedtime as needed (sleep).   Yes [provider]  ?Methylsulfonylmethane (MSM) 1000 MG TABS Take 1,000 mg by mouth daily.   Yes [provider]  ?metoprolol tartrate (LOPRESSOR) 25 MG tablet Take 25 mg by mouth daily. 10/05/21  Yes [provider]  ?nitroGLYCERIN (NITROSTAT) 0.3 MG SL tablet Place 0.3 mg under the tongue every 5 (five) minutes as needed for chest pain. 10/28/15  Yes [provider]  ?ondansetron (ZOFRAN) 4 MG tablet Take 1 tablet (4 mg total) by mouth every 8 (eight) hours as needed for nausea or vomiting. 10/26/21  Yes Quintella Reichert, MD  ?potassium chloride SA (KLOR-CON M) 20  MEQ tablet Take 2 tablets (40 mEq total) by mouth daily for 7 days. 12/13/21 12/20/21 Yes Adora Yeh, Corene Cornea, MD  ?rosuvastatin (CRESTOR) 20 MG tablet Take 1 tablet (20 mg total) by mouth daily. 06/10/21  Yes [name], MD  ?Dellis Anes 160-4.5 MCG/ACT inhaler Inhale 2 puffs into the lungs daily. 07/26/21  Yes [provider]  ?tiZANidine (ZANAFLEX) 4 MG tablet Take 4 mg by mouth every 8 (eight) hours as needed for muscle spasms. 08/27/21  Yes [provider]  ?triamcinolone cream (KENALOG) 0.5 % Apply 1 application topically daily as  needed (for sun exposure).   Yes [provider]  ?azithromycin (ZITHROMAX) 250 MG tablet Take by mouth. ?Patient not taking: Reported on 12/13/2021 12/08/21   [provider]  ?levothyroxine (SYNTHROID) 112 MCG tablet TAKE 1 TABLET BY MOUTH EVERY DAY IN THE MORNING ?Patient not taking: Reported on 12/13/2021 09/20/21   [name], MD  ?pantoprazole (PROTONIX) 40 MG tablet Take 1 tablet (40 mg total) by mouth daily. ?Patient not taking: Reported on 12/13/2021 10/26/21   Quintella Reichert, MD  ?predniSONE (DELTASONE) 20 MG tablet Take 20 mg by mouth daily. ?Patient not taking: Reported on 12/13/2021 12/08/21   [provider]  ?   ? ?Allergies    ?Hydrocodone   ? ?Review of Systems   ?Review of Systems  ?Respiratory:  Positive for shortness of breath.   ?Cardiovascular:  Positive for chest pain.  ? ?Physical Exam ?Updated Vital Signs ?BP 126/82   Pulse 76   Temp 98.3 ?F (36.8 ?C) (Oral)   Resp (!) 26   Ht '5\' 4"'$  (1.626 m)   Wt 99.8 kg   SpO2 98%   BMI 37.76 kg/m?  ?Physical Exam ?Vitals and nursing note reviewed.  ?Constitutional:   ?   Appearance: She is well-developed.  ?HENT:  ?   Head: Normocephalic and atraumatic.  ?   Mouth/Throat:  ?   Pharynx: No oropharyngeal exudate.  ?Cardiovascular:  ?   Rate and Rhythm: Normal rate and regular rhythm.  ?Pulmonary:  ?   Effort: No respiratory distress.  ?   Breath sounds: No stridor. Decreased breath sounds and wheezing present.  ?Abdominal:  ?   General: There is no distension.  ?Musculoskeletal:     ?   General: Normal range of motion.  ?   Cervical back: Normal range of motion.  ?Skin: ?   General: Skin is warm and dry.  ?Neurological:  ?   Mental Status: She is alert.  ? ? ?ED Results / Procedures / Treatments   ?Labs ?(all labs ordered are listed, but only abnormal results are displayed) ?Labs Reviewed  ?COMPREHENSIVE METABOLIC PANEL - Abnormal; Notable for the following components:  ?    Result Value  ? Potassium 2.8 (*)   ? Glucose, Bld  114 (*)   ? Albumin 3.4 (*)   ? All other components within normal limits  ?CBC WITH DIFFERENTIAL/PLATELET - Abnormal; Notable for the following components:  ? Lymphs Abs 4.4 (*)   ? All other components within normal limits  ?LIPASE, BLOOD  ?URINALYSIS, ROUTINE W REFLEX MICROSCOPIC  ?BRAIN NATRIURETIC PEPTIDE  ?TROPONIN I (HIGH SENSITIVITY)  ?TROPONIN I (HIGH SENSITIVITY)  ? ? ?EKG ?None ? ?Radiology ?DG Chest 2 View ? ?Result Date: 12/13/2021 ?CLINICAL DATA:  Chest pain with shortness of breath and back pain. EXAM: CHEST - 2 VIEW COMPARISON:  May 18, 2021 FINDINGS: The heart size and mediastinal contours are within normal limits. A radiopaque loop  recorder device is noted. Both lungs are clear. The visualized skeletal structures are unremarkable. IMPRESSION: No active cardiopulmonary disease. Electronically Signed   By: Virgina Norfolk M.D.   On: 12/13/2021 00:15   ? ?Procedures ?Procedures  ? ? ?Medications Ordered in ED ?Medications  ?dexamethasone (DECADRON) tablet 16 mg (16 mg Oral Given 12/13/21 0151)  ?potassium chloride SA (KLOR-CON M) CR tablet 80 mEq (80 mEq Oral Given 12/13/21 0151)  ? ? ?ED Course/ Medical Decision Making/ A&P ?  ?                        ?Medical Decision Making ?Risk ?Prescription drug management. ? ? ?Patient with hypokalemia likely multifactorial.  Decreased appetite throughout the week and Lasix.  She has pretty significant diminished breath sounds and wheezing.  She is not any distress or hypoxic.  I offered to breathing treatments and potassium replenishment here but she prefers to use her inhaler at home for the same.  We will give her a dose of steroids and potassium here she will use her breathing treatments at home follow-up with her PCP to make sure her potassium is improving.  Rest of her work-up is reassuring.  Low suspicion for ACS, PE or other acute emergent issues at this time. ? ? ?Final Clinical Impression(s) / ED Diagnoses ?Final diagnoses:  ?Hypokalemia  ?Wheezing   ? ? ?Rx / DC Orders ?ED Discharge Orders   ? ?      Ordered  ?  potassium chloride SA (KLOR-CON M) 20 MEQ tablet  Daily       ? 12/13/21 0224  ? ?  ?  ? ?  ? ? ?  ?Merrily Pew, MD ?12/13/21 0375 ? ?

## 2021-12-14 NOTE — Progress Notes (Signed)
Carelink Summary Report / Loop Recorder 

## 2021-12-30 ENCOUNTER — Ambulatory Visit: Payer: Medicare Other | Admitting: *Deleted

## 2022-01-03 ENCOUNTER — Ambulatory Visit (INDEPENDENT_AMBULATORY_CARE_PROVIDER_SITE_OTHER): Payer: Medicare Other

## 2022-01-03 DIAGNOSIS — I639 Cerebral infarction, unspecified: Secondary | ICD-10-CM | POA: Diagnosis not present

## 2022-01-03 LAB — CUP PACEART REMOTE DEVICE CHECK
Date Time Interrogation Session: 20230531230808
Implantable Pulse Generator Implant Date: 20221110

## 2022-01-04 ENCOUNTER — Other Ambulatory Visit: Payer: Self-pay | Admitting: *Deleted

## 2022-01-04 NOTE — Patient Outreach (Signed)
[facility]) Care Management Telephonic RN Care Manager Note   01/04/2022 Name:  Jocelyn [name] MRN:  [id number] DOB:  Mar 26, 1961  Summary: Jocelyn [name] call placed to member, successful.  Denies any urgent concerns, encouraged to contact this care manager with questions.    Subjective: Jocelyn [name] is an 62 y.o. year old female who is a primary patient of Osei-Bonsu, Iona Beard, MD. The care management team was consulted for assistance with care management and/or care coordination needs.    Telephonic RN Care Manager completed Telephone Visit today.  Objective:   Medications Reviewed Today     Reviewed by Lannette Donath, CPhT (Pharmacy Technician) on 12/13/21 at [place] List Status: Complete   Medication Order Taking? Sig Documenting Provider Last Dose Status Informant  albuterol (PROVENTIL HFA;VENTOLIN HFA) 108 (90 Base) MCG/ACT inhaler [id number] Yes Inhale 2 puffs into the lungs every 6 (six) hours as needed for wheezing or shortness of breath. [provider] unk Active Self           Med Note Gar Ponto   Fri May 29, 2020 11:14 AM)    ALPRAZolam [name]) 1 MG tablet [id number] Yes Take 1 mg by mouth daily. [provider] 12/12/2021 Active Self           Med Note Gar Ponto   Fri May 29, 2020 11:14 AM)    aspirin EC 81 MG tablet [id number] Yes Take 81 mg by mouth daily. Swallow whole. [provider] 12/12/2021 Active Self  azithromycin (ZITHROMAX) 250 MG tablet [id number] No Take by mouth.  Patient not taking: Reported on 12/13/2021   [provider] Completed Course Active Self  clopidogrel (PLAVIX) 75 MG tablet [id number] Yes Take 1 tablet (75 mg total) by mouth daily. [name], MD 12/12/2021 Active Self  furosemide (LASIX) 40 MG tablet [id number] Yes Take 40 mg by mouth daily. [provider] 12/12/2021 Active Self  gabapentin (NEURONTIN) 300 MG capsule [id number] Yes Take 300 mg by mouth at bedtime as  needed (pain). [provider] Past Month Active Self  ibuprofen (ADVIL) 200 MG tablet [id number] Yes Take 400 mg by mouth every 6 (six) hours as needed for mild pain. [provider] unk Active Self  ipratropium-albuterol (DUONEB) 0.5-2.5 (3) MG/3ML SOLN [id number] Yes Take 3 mLs by nebulization every 6 (six) hours as needed.  Patient taking differently: Take 3 mLs by nebulization every 6 (six) hours as needed (shortness of breath, wheezing).   [name], [name], MD Past Week Active Self  isosorbide mononitrate (IMDUR) 30 MG 24 hr tablet [id number] Yes Take 2 tablets (60 mg total) by mouth daily.  Patient taking differently: Take 30 mg by mouth daily.   [name], MD 12/12/2021 Active Self           Med Note Gar Ponto   Fri May 29, 2020 11:14 AM)    levothyroxine (SYNTHROID) 100 MCG tablet [id number] Yes Take 100 mcg by mouth daily. [provider] 12/12/2021 Active Self  levothyroxine (SYNTHROID) 112 MCG tablet [id number] No TAKE 1 TABLET BY MOUTH EVERY DAY IN THE MORNING  Patient not taking: Reported on 12/13/2021   [name], MD Not Taking Active Self  LINZESS 145 MCG CAPS capsule [id number] Yes Take 145 mcg by mouth daily as needed (constipation). [provider] 12/11/2021 Active Self  MELATONIN PO [id number] Yes Take 10-20 mg by mouth at bedtime as needed (sleep). [provider] Past Month Active Self  Methylsulfonylmethane (  MSM) 1000 MG TABS [id number] Yes Take 1,000 mg by mouth daily. [provider]  Active Self  metoprolol tartrate (LOPRESSOR) 25 MG tablet [id number] Yes Take 25 mg by mouth daily. [provider] 12/12/2021 1000 Active Self  nitroGLYCERIN (NITROSTAT) 0.3 MG SL tablet [id number] Yes Place 0.3 mg under the tongue every 5 (five) minutes as needed for chest pain. [provider] unk Active Self           Med Note Belva Agee   Sat Jul 16, 2016  5:59 PM)    ondansetron (ZOFRAN) 4 MG  tablet [id number] Yes Take 1 tablet (4 mg total) by mouth every 8 (eight) hours as needed for nausea or vomiting. Quintella Reichert, MD unk Active Self  pantoprazole (PROTONIX) 40 MG tablet [id number] No Take 1 tablet (40 mg total) by mouth daily.  Patient not taking: Reported on 12/13/2021   Quintella Reichert, MD Not Taking Active Self  predniSONE (DELTASONE) 20 MG tablet [id number] No Take 20 mg by mouth daily.  Patient not taking: Reported on 12/13/2021   [provider] Completed Course Active Self  rosuvastatin (CRESTOR) 20 MG tablet [id number] Yes Take 1 tablet (20 mg total) by mouth daily. [name], MD Past Week Active Self  SYMBICORT 160-4.5 MCG/ACT inhaler [id number] Yes Inhale 2 puffs into the lungs daily. [provider] 12/12/2021 Active Self  tiZANidine (ZANAFLEX) 4 MG tablet [id number] Yes Take 4 mg by mouth every 8 (eight) hours as needed for muscle spasms. [provider] Past Month Active Self  triamcinolone cream (KENALOG) 0.5 % [id number] Yes Apply 1 application topically daily as needed (for sun exposure). [provider] Past Week Active Self             SDOH:  (Social Determinants of Health) assessments and interventions performed:     Care Plan  Review of patient past medical history, allergies, medications, health status, including review of consultants reports, laboratory and other test data, was performed as part of comprehensive evaluation for care management services.   Care Plan : [facility] Plan of Care (Adult)  Updates made by [name], RN since 01/04/2022 12:00 AM     Problem: Care Coordination needs and assistance with managing chronic medical conditions (COPD & Stroke)   Priority: High     Long-Range Goal: Adequate management of chronic medical conditions (COPD & Stroke)   Start Date: 06/22/2021  Expected End Date: 06/22/2022  This Visit's Progress: On track  Recent Progress: On track  Priority: High  Note:   Current  Barriers:  Knowledge Deficits related to plan of care for management of COPD, Tobacco Use, and Stroke Care Coordination needs related to Mental Health Concerns  Has depression post stroke  RNCM Clinical Goal(s):  Patient will verbalize understanding of plan for management of COPD, Tobacco Use, and Stroke take all medications exactly as prescribed and will call provider for medication related questions attend all scheduled medical appointments: Cardiology, office called to request follow up with assigned provider, Dr. Irish Lack continue to work with RN Care Manager to address care management and care coordination needs related to  COPD, Tobacco Use, and Stroke work with Education officer, museum to address  related to the management of [place] Concerns  related to the management of COPD, Tobacco Use, and Stroke  through collaboration with Consulting civil engineer, provider, and care team.   Interventions: Inter-disciplinary care team collaboration (see longitudinal plan of care) Evaluation of current treatment plan related to  self management  and patient's adherence to plan as established by provider  StrokeCondition stable.  Not addressed this visit. Evaluation of current treatment plan related to Tobacco Use and Stroke , Mental Health Concerns  self-management and patient's adherence to plan as established by provider. Discussed plans with patient for ongoing care management follow up and provided patient with direct contact information for care management team Provided education to patient re: smoking cessation and increased risk for recurrent stroke; Reviewed medications with patient and discussed notifying MD with changes; Social Work referral for depression, elevated PHQ9;  COPD Interventions:   Advised patient to track and manage COPD triggers;  Discussed the importance of adequate rest and management of fatigue with COPD; Screening for signs and symptoms of depression related to chronic disease  state;   Patient Goals/Self-Care Activities: Patient will self administer medications as prescribed Patient will attend all scheduled provider appointments Patient will continue to perform ADL's independently Patient will continue to perform IADL's independently  Follow Up Plan:  The patient has been provided with contact information for the care management team and has been advised to call with any health related questions or concerns.    Update 11/29 - Red EMMI alert received today, denies complaints.  State she is improving since starting outpatient speech therapy.   Update 12/6 - Spoke with member and daughter, they both report member is recovering well.  She has appointment with psych for depression/anxiety on 1/2, will continue follow up with CSW until then.  They are concerned about her blood pressure ranging 90-100s/60's, stopped taking Metoprolol.  She remains on Lasix and Imdur.  Conference call placed to cardiology office to follow up, unable to speak with office due to hold time but office will call member directly to schedule follow up appointment for concerns.     Update 1/3 - Member report she is still recovering from stroke.  Attending speech therapy, feels it has helped a lot.  She is still staying with her daughter but is at her home alone during the day for some independence.  State blood pressure has remained "good" since being off Metoprolol, will follow up with cardiology on 1/17 and neurology on 1/26.  Report her anxiety has decreased some, had appointment with psych yesterday, follow up in 3 months.  After speaking with her PCP, determined that the anxiety may be caused by thyroid levels as she is also having mood swings, night sweats, and hair loss.  PCP in the process of re-assessing TSH levels.   Member inquires about the ongoing use of her oxygen, state she is now only wearing it as needed, oxygen levels are usually greater than 90%.  Encouraged to document throughout the  day and discuss with provider as she would like to be completely off of it.  She has decreased her smoking to 1/2-1 cigarette per week.   Update 2/1 - Follow up appointment with cardiology completed on 1/17 and with neurology on 1/26, no changes in plan of care.  Loop recorder has not showed any evidenced of A-fib but she will continue to be monitored.  Discharged from outpatient rehab services and back in her own home, living independently as well as able to drive self for errands.  State blood pressure has been "good" and was told to lose weight, gained 20 pounds since she stopped smoking.  Does complain of intermittent hot flashes, was taken off estrogen supplement but will discuss with PCP on 2/7 during office visit.  Otherwise, state she is doing  well.     Update 3/1 - Member report she has continued to recover well.  Was seen by PCP yesterday for AWV, state she is still concerned about her memory and speech at times.  Advised by this care manager and providers that stroke recovery can take time, encouragement provided on progress thus far.  Aware of signs/symptoms of stroke, encouraged to seek medical attention should she experience again.  Has appointment with GI on 4/3 for possible hernia, denies current pain or discomfort.     Update 4/4 - State she is dong "alright."  She was seen by GI yesterday for concern for hernia.  Does not have hernia but does have issue with chronic constipation.  New medications prescribed, state she will follow regime in effort to provide consistent relief.  Report her blood pressure has been stable, today was 109/75.  She is wanting to stop some of her medications.  Reviewed with member, advised that there are 2 that may have minimal affect on blood pressure (Imdur and Lasix).  She has appointment with PCP today, will discuss with him.  She is also concerned that she is not sleeping and continues to have anxiety.  She denies taking prescribed Xanax.  State she is taking  Melatonin at times but does not feel it is helping.  Advised to take Xanax as prescribed as well as contact her counselor for therapy session.  Denies shortness of breath or chest discomfort.   Update 5/2 - Member report doing well with the exception of not being able to completely quit smoking.  She had decreased the amount she was smoking per day but increases depending on life circumstances.  She has tried medication, patch, and gum, will discuss further options with PCP within the next couple weeks.  No more issues with abdominal discomfort, state she was eating nothing but [employer] french fries daily, has since stopped.  Blood pressure is reported "good," reminded to record exact numbers.     Update 6/6 - Denies any ongoing GI issues but does report having an increase in swelling intermittently as well as shortness of breath a few weeks ago for which she was seen in the ED.  Admits that she has not been using her inhaler and nebulizers as instructed due to feeling "ok."  Reviewed Symbicort instructions as using daily as well as PRN Albuterol inhaler and nebulizer.  Denies any current shortness of breath, will call PCP and cardiology regarding swelling (relieved with rest and elevation).  She is wanting the loop recorder removed, benefits of device post stroke discussed, she will discuss further with cardiology.  Denies any weight gain but report she has had a change in her diet as she has had a family member living living with her over the last several weeks.  Proper low salt/low fat diet reviewed, verbalized understanding.          Plan:  Telephone follow up appointment with care management team member scheduled for:  1 month The patient has been provided with contact information for the care management team and has been advised to call with any health related questions or concerns.   [name], RN, MSN, [place] Manager [phone]

## 2022-01-13 DIAGNOSIS — E039 Hypothyroidism, unspecified: Secondary | ICD-10-CM | POA: Diagnosis not present

## 2022-01-13 DIAGNOSIS — I10 Essential (primary) hypertension: Secondary | ICD-10-CM | POA: Diagnosis not present

## 2022-01-13 DIAGNOSIS — K5909 Other constipation: Secondary | ICD-10-CM | POA: Diagnosis not present

## 2022-01-13 DIAGNOSIS — J441 Chronic obstructive pulmonary disease with (acute) exacerbation: Secondary | ICD-10-CM | POA: Diagnosis not present

## 2022-01-13 DIAGNOSIS — Z0000 Encounter for general adult medical examination without abnormal findings: Secondary | ICD-10-CM | POA: Diagnosis not present

## 2022-01-13 DIAGNOSIS — I2510 Atherosclerotic heart disease of native coronary artery without angina pectoris: Secondary | ICD-10-CM | POA: Diagnosis not present

## 2022-01-13 DIAGNOSIS — I5032 Chronic diastolic (congestive) heart failure: Secondary | ICD-10-CM | POA: Diagnosis not present

## 2022-01-13 DIAGNOSIS — E782 Mixed hyperlipidemia: Secondary | ICD-10-CM | POA: Diagnosis not present

## 2022-01-13 DIAGNOSIS — R7309 Other abnormal glucose: Secondary | ICD-10-CM | POA: Diagnosis not present

## 2022-01-20 NOTE — Progress Notes (Signed)
Carelink Summary Report / Loop Recorder 

## 2022-01-24 ENCOUNTER — Other Ambulatory Visit: Payer: Self-pay | Admitting: *Deleted

## 2022-02-03 ENCOUNTER — Ambulatory Visit: Payer: Medicare Other | Admitting: *Deleted

## 2022-02-06 LAB — CUP PACEART REMOTE DEVICE CHECK
Date Time Interrogation Session: 20230703231220
Implantable Pulse Generator Implant Date: 20221110

## 2022-02-07 ENCOUNTER — Ambulatory Visit (INDEPENDENT_AMBULATORY_CARE_PROVIDER_SITE_OTHER): Payer: Medicare Other

## 2022-02-07 DIAGNOSIS — I639 Cerebral infarction, unspecified: Secondary | ICD-10-CM

## 2022-02-09 ENCOUNTER — Telehealth: Payer: Self-pay | Admitting: Interventional Cardiology

## 2022-02-09 NOTE — Telephone Encounter (Signed)
Successful telephone encounter to patient to discuss her request to have loop recorder removed. Patient states loop recorder is "aggravating" however denies soreness or adverse reactions. After in depth discussion on risk and benefits and reason for loop implant for CVA, patient has decided to leave device implanted at this time and will contact device clinic for appointment if she decides to have removed prior to device RRT. Patient appreciative of information. Will continue to monitor monthly for AF.

## 2022-02-09 NOTE — Telephone Encounter (Signed)
Patient stated that her loop recorder is irritating her and she would like to have it removed.

## 2022-02-21 DIAGNOSIS — E782 Mixed hyperlipidemia: Secondary | ICD-10-CM | POA: Diagnosis not present

## 2022-02-21 DIAGNOSIS — I10 Essential (primary) hypertension: Secondary | ICD-10-CM | POA: Diagnosis not present

## 2022-02-21 DIAGNOSIS — I2510 Atherosclerotic heart disease of native coronary artery without angina pectoris: Secondary | ICD-10-CM | POA: Diagnosis not present

## 2022-02-21 DIAGNOSIS — E039 Hypothyroidism, unspecified: Secondary | ICD-10-CM | POA: Diagnosis not present

## 2022-02-21 DIAGNOSIS — J441 Chronic obstructive pulmonary disease with (acute) exacerbation: Secondary | ICD-10-CM | POA: Diagnosis not present

## 2022-02-21 DIAGNOSIS — K5909 Other constipation: Secondary | ICD-10-CM | POA: Diagnosis not present

## 2022-02-21 DIAGNOSIS — I5032 Chronic diastolic (congestive) heart failure: Secondary | ICD-10-CM | POA: Diagnosis not present

## 2022-02-21 DIAGNOSIS — T148XXA Other injury of unspecified body region, initial encounter: Secondary | ICD-10-CM | POA: Diagnosis not present

## 2022-02-23 ENCOUNTER — Ambulatory Visit (INDEPENDENT_AMBULATORY_CARE_PROVIDER_SITE_OTHER): Payer: Medicare Other | Admitting: Neurology

## 2022-02-23 ENCOUNTER — Encounter: Payer: Self-pay | Admitting: Neurology

## 2022-02-23 VITALS — BP 100/67 | HR 79 | Ht 64.0 in | Wt 221.0 lb

## 2022-02-23 DIAGNOSIS — Z8673 Personal history of transient ischemic attack (TIA), and cerebral infarction without residual deficits: Secondary | ICD-10-CM

## 2022-02-23 DIAGNOSIS — E782 Mixed hyperlipidemia: Secondary | ICD-10-CM

## 2022-02-23 DIAGNOSIS — M542 Cervicalgia: Secondary | ICD-10-CM

## 2022-02-23 NOTE — Patient Instructions (Signed)
I had a long d/w patient about her remote stroke, risk for recurrent stroke/TIAs, personally independently reviewed imaging studies and stroke evaluation results and answered questions.Continue aspirin 81 mg daily and stop Plavix now as she is complaining of increased bruising and did not see a clear indication for long-term dual antiplatelet therapy for secondary stroke prevention and maintain strict control of hypertension with blood pressure goal below 130/90, diabetes with hemoglobin A1c goal below 6.5% and lipids with LDL cholesterol goal below 70 mg/dL. I also advised the patient to eat a healthy diet with plenty of whole grains, cereals, fruits and vegetables, exercise regularly and maintain ideal body weight .check follow-up lipid profile, hemoglobin A1c and screening carotid ultrasound study.  I also recommend she do regular neck stretching exercises and participate in regular activities for stress relaxation like meditation, yoga, exercises, swimming to help with mild tension headaches.  Followup in the future with me in 1 year or call earlier if necessary. Neck Exercises Ask your health care provider which exercises are safe for you. Do exercises exactly as told by your health care provider and adjust them as directed. It is normal to feel mild stretching, pulling, tightness, or discomfort as you do these exercises. Stop right away if you feel sudden pain or your pain gets worse. Do not begin these exercises until told by your health care provider. Neck exercises can be important for many reasons. They can improve strength and maintain flexibility in your neck, which will help your upper back and prevent neck pain. Stretching exercises Rotation neck stretching  Sit in a chair or stand up. Place your feet flat on the floor, shoulder-width apart. Slowly turn your head (rotate) to the right until a slight stretch is felt. Turn it all the way to the right so you can look over your right shoulder. Do  not tilt or tip your head. Hold this position for 10-30 seconds. Slowly turn your head (rotate) to the left until a slight stretch is felt. Turn it all the way to the left so you can look over your left shoulder. Do not tilt or tip your head. Hold this position for 10-30 seconds. Repeat __________ times. Complete this exercise __________ times a day. Neck retraction  Sit in a sturdy chair or stand up. Look straight ahead. Do not bend your neck. Use your fingers to push your chin backward (retraction). Do not bend your neck for this movement. Continue to face straight ahead. If you are doing the exercise properly, you will feel a slight sensation in your throat and a stretch at the back of your neck. Hold the stretch for 1-2 seconds. Repeat __________ times. Complete this exercise __________ times a day. Strengthening exercises Neck press  Lie on your back on a firm bed or on the floor with a pillow under your head. Use your neck muscles to push your head down on the pillow and straighten your spine. Hold the position as well as you can. Keep your head facing up (in a neutral position) and your chin tucked. Slowly count to 5 while holding this position. Repeat __________ times. Complete this exercise __________ times a day. Isometrics These are exercises in which you strengthen the muscles in your neck while keeping your neck still (isometrics). Sit in a supportive chair and place your hand on your forehead. Keep your head and face facing straight ahead. Do not flex or extend your neck while doing isometrics. Push forward with your head and neck while pushing  back with your hand. Hold for 10 seconds. Do the sequence again, this time putting your hand against the back of your head. Use your head and neck to push backward against the hand pressure. Finally, do the same exercise on either side of your head, pushing sideways against the pressure of your hand. Repeat __________ times. Complete  this exercise __________ times a day. Prone head lifts  Lie face-down (prone position), resting on your elbows so that your chest and upper back are raised. Start with your head facing downward, near your chest. Position your chin either on or near your chest. Slowly lift your head upward. Lift until you are looking straight ahead. Then continue lifting your head as far back as you can comfortably stretch. Hold your head up for 5 seconds. Then slowly lower it to your starting position. Repeat __________ times. Complete this exercise __________ times a day. Supine head lifts  Lie on your back (supine position), bending your knees to point to the ceiling and keeping your feet flat on the floor. Lift your head slowly off the floor, raising your chin toward your chest. Hold for 5 seconds. Repeat __________ times. Complete this exercise __________ times a day. Scapular retraction  Stand with your arms at your sides. Look straight ahead. Slowly pull both shoulders (scapulae) backward and downward (retraction) until you feel a stretch between your shoulder blades in your upper back. Hold for 10-30 seconds. Relax and repeat. Repeat __________ times. Complete this exercise __________ times a day. Contact a health care provider if: Your neck pain or discomfort gets worse when you do an exercise. Your neck pain or discomfort does not improve within 2 hours after you exercise. If you have any of these problems, stop exercising right away. Do not do the exercises again unless your health care provider says that you can. Get help right away if: You develop sudden, severe neck pain. If this happens, stop exercising right away. Do not do the exercises again unless your health care provider says that you can. This information is not intended to replace advice given to you by your health care provider. Make sure you discuss any questions you have with your health care provider. Document Revised:  01/12/2021 Document Reviewed: 01/12/2021 Elsevier Patient Education  [place].

## 2022-02-23 NOTE — Progress Notes (Unsigned)
[facility] [address] [place]. [place] [address] [phone]       OFFICE FOLLOW-UP NOTE  [name] Date of Birth:  10-Apr-1961 Medical Record Number:  [id number]   HPI: Initial visit 08/26/2021 Jocelyn [name] is a 61 year old pleasant African-American lady seen today for initial office follow-up visit following hospital admission for stroke in November 2022.  She is accompanied by her daughter and granddaughter.  History is obtained from them and review of electronic medical records and I have personally reviewed available pertinent imaging films and PACS.  She is a 61 year old lady with past medical history of hypertension, hyperlipidemia, hypothyroidism, coronary artery disease, prior LV thrombus was on anticoagulation for a short time, COPD remote smoking quit a month ago, anxiety, depression.  Patient presented on 06/08/2021 with sudden onset of speech difficulties which did not make sense.  She was brought in as a code stroke.  NIH stroke scale was 3 on admission initial head CT was negative for acute abnormalities.  CT angiogram of the brain and neck did not show any large vessel stenosis or occlusion.  MRI scan showed a small left temporoparietal cortical embolic infarct.  Hemoglobin A1c was 6.2.  LDL cholesterol is '1 1 2 '$ mg percent.  Transthoracic echo showed resolution of the previous LV thrombus and no new clot or any other cardiac source of embolism.  Patient had a loop recorder placed and so far paroxysmal A. fib has not yet been found.  Speech symptoms resolved completely.  She states she is done well.  Only when she is tired towards the end of the day occasionally she may struggle with some words have some word finding difficulties.  She has finished outpatient speech therapy.  She has been placed by her cardiologist on aspirin and Plavix dual antiplatelet therapy which is tolerating well without any bruising or bleeding.  She is on Crestor and tolerating it well  without muscle aches and pains.  Her blood pressure is under good control today it is 114/74.  She has no new complaints today. Update 02/23/2022: She returns for follow-up after last visit 6 months ago.  She states she is doing well.  She has had no recurrent stroke or TIA symptoms.  Her speech is almost back to normal but rarely she has occasional word finding difficulties when she tries to talk fast or gets excited.  She knows that she needs to slow down while talking.  She remains on aspirin and Plavix but does complain of increasing bruising.  She did have remote history of cardiac stent in 2017 but it was 100% occluded.  She has had no recent cardiac symptoms or procedures.  She is concerned a little bit about her decreased hearing but she has not yet discussed this with her primary care physician.  She complains of mild posterior neck pain and does complain of some tightness in the neck and shoulder muscles.  She has not been doing any regular neck stretching exercises or any stress relaxation activities.  Her blood pressures under good control today it is 100/67.  She has not yet had lipid profile check a carotid ultrasound checked this year. ROS:   14 system review of systems is positive for speech difficulties, word finding difficulties, bruising all other systems negative  PMH:  Past Medical History:  Diagnosis Date   Anxiety    Arthritis    Chronic diastolic CHF (congestive heart failure), NYHA class 2 (HCC)    COPD (chronic obstructive  pulmonary disease) ([facility])    Depression    HTN (hypertension)    Hypercholesterolemia    Hypothyroidism (acquired)    Kidney stones 2015   Myocardial infarction [facility])    x3- pt unsure if MIs were in 1996 or 1999, had stent fixed in 2016 but no MI at that time per pt   Stroke [facility]) 06/2021   Thyrotoxicosis    TIA (transient ischemic attack)    2013    Social History:  Social History   Socioeconomic History   Marital status: Legally Separated     Spouse name: Not on file   Number of children: 2   Years of education: 12   Highest education level: 12th grade  Occupational History   Occupation: [employer]  Tobacco Use   Smoking status: Some Days    Packs/day: 0.50    Types: Cigarettes    Passive exposure: Current   Smokeless tobacco: Never  Vaping Use   Vaping Use: Never used  Substance and Sexual Activity   Alcohol use: No    Alcohol/week: 0.0 standard drinks of alcohol   Drug use: No   Sexual activity: Not Currently  Other Topics Concern   Not on file  Social History Narrative   Right-handed.   Caffeine use: 5 cups per day.   Living with her daughter right now.   Social Determinants of Health   Financial Resource Strain: Low Risk  (07/01/2021)   Overall Financial Resource Strain (CARDIA)    Difficulty of Paying Living Expenses: Not hard at all  Food Insecurity: No Food Insecurity (07/01/2021)   Hunger Vital Sign    Worried About Running Out of Food in the Last Year: Never true    Ran Out of Food in the Last Year: Never true  Transportation Needs: No Transportation Needs (07/01/2021)   PRAPARE - Hydrologist (Medical): No    Lack of Transportation (Non-Medical): No  Physical Activity: Inactive (07/01/2021)   Exercise Vital Sign    Days of Exercise per Week: 0 days    Minutes of Exercise per Session: 0 min  Stress: Stress Concern Present (07/01/2021)   [place]    Feeling of Stress : Rather much  Social Connections: Socially Isolated (07/01/2021)   Social Connection and Isolation Panel [NHANES]    Frequency of Communication with Friends and Family: More than three times a week    Frequency of Social Gatherings with Friends and Family: More than three times a week    Attends Religious Services: Never    Marine scientist or Organizations: No    Attends Archivist Meetings: Never    Marital Status: Separated   Intimate Partner Violence: Not At Risk (07/01/2021)   Humiliation, Afraid, Rape, and Kick questionnaire    Fear of Current or Ex-Partner: No    Emotionally Abused: No    Physically Abused: No    Sexually Abused: No    Medications:   Current Outpatient Medications on File Prior to Visit  Medication Sig Dispense Refill   ALPRAZolam (XANAX) 1 MG tablet Take 1 mg by mouth daily.     aspirin EC 81 MG tablet Take 81 mg by mouth daily. Swallow whole.     furosemide (LASIX) 40 MG tablet Take 40 mg by mouth daily.     gabapentin (NEURONTIN) 300 MG capsule Take 300 mg by mouth at bedtime as needed (pain).     ipratropium-albuterol (  DUONEB) 0.5-2.5 (3) MG/3ML SOLN Take 3 mLs by nebulization every 6 (six) hours as needed. (Patient taking differently: Take 3 mLs by nebulization every 6 (six) hours as needed (shortness of breath, wheezing).) 360 mL 1   levothyroxine (SYNTHROID) 100 MCG tablet Take 100 mcg by mouth daily.     MELATONIN PO Take 10-20 mg by mouth at bedtime as needed (sleep).     Methylsulfonylmethane (MSM) 1000 MG TABS Take 1,000 mg by mouth daily.     metoprolol tartrate (LOPRESSOR) 25 MG tablet Take 25 mg by mouth daily.     nitroGLYCERIN (NITROSTAT) 0.3 MG SL tablet Place 0.3 mg under the tongue every 5 (five) minutes as needed for chest pain.  0   triamcinolone cream (KENALOG) 0.5 % Apply 1 application topically daily as needed (for sun exposure).     albuterol (PROVENTIL HFA;VENTOLIN HFA) 108 (90 Base) MCG/ACT inhaler Inhale 2 puffs into the lungs every 6 (six) hours as needed for wheezing or shortness of breath.     potassium chloride SA (KLOR-CON M) 20 MEQ tablet Take 2 tablets (40 mEq total) by mouth daily for 7 days. 14 tablet 0   No current facility-administered medications on file prior to visit.    Allergies:   Allergies  Allergen Reactions   Hydrocodone Itching    Has problems with high doses    Physical Exam General: Mildly obese middle-aged African-American lady  seated, in no evident distress Head: head normocephalic and atraumatic.  Neck: supple with no carotid or supraclavicular bruits Cardiovascular: regular rate and rhythm, no murmurs Musculoskeletal: no deformity Skin:  no rash/petichiae Vascular:  Normal pulses all extremities Vitals:   02/23/22 1451  BP: 100/67  Pulse: 79   Neurologic Exam Mental Status: Awake and fully alert. Oriented to place and time. Recent and remote memory intact. Attention span, concentration and fund of knowledge appropriate. Mood and affect appropriate.  Cranial Nerves: Fundoscopic exam not done. Pupils equal, briskly reactive to light. Extraocular movements full without nystagmus. Visual fields full to confrontation. Hearing intact. Facial sensation intact. Face, tongue, palate moves normally and symmetrically.  Motor: Normal bulk and tone. Normal strength in all tested extremity muscles. Sensory.: intact to touch ,pinprick .position and vibratory sensation.  Coordination: Rapid alternating movements normal in all extremities. Finger-to-nose and heel-to-shin performed accurately bilaterally. Gait and Station: Arises from chair without difficulty. Stance is normal. Gait demonstrates normal stride length and balance . Able to heel, toe and tandem walk with mild  difficulty.  Reflexes: 1+ and symmetric. Toes downgoing.   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  She is doing extremely well with only occasional word finding difficulties.  Vascular risk factors of cardiomyopathy, hypertension and hyperlipidemia.  Mild posterior neck pain likely musculoskeletal due to muscle tension.     PLAN: I had a long d/w patient about her remote stroke, risk for recurrent stroke/TIAs, personally independently reviewed imaging studies and stroke evaluation results and answered questions.Continue aspirin 81 mg daily  and stop Plavix now as she is complaining of increased bruising and did not see a clear indication for long-term dual antiplatelet therapy for secondary stroke prevention and maintain strict control of hypertension with blood pressure goal below 130/90, diabetes with hemoglobin A1c goal below 6.5% and lipids with LDL cholesterol goal below 70 mg/dL. I also advised the patient to eat a healthy diet with plenty of whole grains,  cereals, fruits and vegetables, exercise regularly and maintain ideal body weight .check follow-up lipid profile, hemoglobin A1c and screening carotid ultrasound study.  I also recommend she do regular neck stretching exercises and participate in regular activities for stress relaxation like meditation, yoga, exercises, swimming to help with mild tension headaches.  Followup in the future with me in 1 year or call earlier if necessary. Greater than 50% of time during this 35 minute visit was spent on counseling,explanation of diagnosis of stroke and neck pain, planning of further management, discussion with patient and family and coordination of care Antony Contras, MD Note: This document was prepared with digital dictation and possible smart phrase technology. Any transcriptional errors that result from this process are unintentional

## 2022-02-24 LAB — LIPID PANEL
Chol/HDL Ratio: 2.2 ratio (ref 0.0–4.4)
Cholesterol, Total: 183 mg/dL (ref 100–199)
HDL: 82 mg/dL (ref >39)
LDL Chol Calc (NIH): 79 mg/dL (ref 0–99)
Triglycerides: 128 mg/dL (ref 0–149)
VLDL Cholesterol Cal: 22 mg/dL (ref 5–40)

## 2022-02-24 LAB — HEMOGLOBIN A1C
Est. average glucose Bld gHb Est-mCnc: 131 mg/dL
Hgb A1c MFr Bld: 6.2 % — ABNORMAL HIGH (ref 4.8–5.6)

## 2022-03-07 NOTE — Progress Notes (Signed)
Carelink Summary Report / Loop Recorder 

## 2022-03-10 LAB — CUP PACEART REMOTE DEVICE CHECK
Date Time Interrogation Session: 20230805230905
Implantable Pulse Generator Implant Date: 20221110

## 2022-03-11 ENCOUNTER — Ambulatory Visit (HOSPITAL_COMMUNITY)
Admission: RE | Admit: 2022-03-11 | Discharge: 2022-03-11 | Disposition: A | Discharge status: Home / Self Care | Payer: Medicare Other | Source: Ambulatory Visit | Attending: Neurology | Admitting: Neurology

## 2022-03-11 DIAGNOSIS — Z8673 Personal history of transient ischemic attack (TIA), and cerebral infarction without residual deficits: Secondary | ICD-10-CM | POA: Diagnosis not present

## 2022-03-11 NOTE — Progress Notes (Signed)
Carotid duplex has been completed.   Preliminary results in CV Proc.   Jocelyn [name] 03/11/2022 2:44 PM

## 2022-03-14 ENCOUNTER — Ambulatory Visit: Payer: Medicare Other

## 2022-03-15 DIAGNOSIS — I5032 Chronic diastolic (congestive) heart failure: Secondary | ICD-10-CM | POA: Diagnosis not present

## 2022-03-15 DIAGNOSIS — L259 Unspecified contact dermatitis, unspecified cause: Secondary | ICD-10-CM | POA: Diagnosis not present

## 2022-03-15 DIAGNOSIS — E782 Mixed hyperlipidemia: Secondary | ICD-10-CM | POA: Diagnosis not present

## 2022-03-15 DIAGNOSIS — E039 Hypothyroidism, unspecified: Secondary | ICD-10-CM | POA: Diagnosis not present

## 2022-03-15 DIAGNOSIS — J441 Chronic obstructive pulmonary disease with (acute) exacerbation: Secondary | ICD-10-CM | POA: Diagnosis not present

## 2022-03-15 DIAGNOSIS — I2510 Atherosclerotic heart disease of native coronary artery without angina pectoris: Secondary | ICD-10-CM | POA: Diagnosis not present

## 2022-03-15 DIAGNOSIS — K5909 Other constipation: Secondary | ICD-10-CM | POA: Diagnosis not present

## 2022-03-15 DIAGNOSIS — I10 Essential (primary) hypertension: Secondary | ICD-10-CM | POA: Diagnosis not present

## 2022-04-16 ENCOUNTER — Other Ambulatory Visit: Payer: Self-pay

## 2022-04-16 ENCOUNTER — Encounter (HOSPITAL_COMMUNITY): Payer: Self-pay | Admitting: *Deleted

## 2022-04-16 ENCOUNTER — Ambulatory Visit (HOSPITAL_COMMUNITY)
Admission: EM | Admit: 2022-04-16 | Discharge: 2022-04-16 | Disposition: A | Discharge status: Home / Self Care | Payer: Medicare Other

## 2022-04-16 NOTE — ED Triage Notes (Signed)
PT was stung on Thursday on Rt upper arm. Pt has swelling and itching to RT upper arm.

## 2022-04-18 ENCOUNTER — Ambulatory Visit (INDEPENDENT_AMBULATORY_CARE_PROVIDER_SITE_OTHER): Payer: Medicare Other

## 2022-04-18 DIAGNOSIS — I639 Cerebral infarction, unspecified: Secondary | ICD-10-CM

## 2022-04-20 LAB — CUP PACEART REMOTE DEVICE CHECK
Date Time Interrogation Session: 20230917231640
Implantable Pulse Generator Implant Date: 20221110

## 2022-04-27 DIAGNOSIS — I10 Essential (primary) hypertension: Secondary | ICD-10-CM | POA: Diagnosis not present

## 2022-04-27 DIAGNOSIS — K5909 Other constipation: Secondary | ICD-10-CM | POA: Diagnosis not present

## 2022-04-27 DIAGNOSIS — I5032 Chronic diastolic (congestive) heart failure: Secondary | ICD-10-CM | POA: Diagnosis not present

## 2022-04-27 DIAGNOSIS — E782 Mixed hyperlipidemia: Secondary | ICD-10-CM | POA: Diagnosis not present

## 2022-04-27 DIAGNOSIS — I2510 Atherosclerotic heart disease of native coronary artery without angina pectoris: Secondary | ICD-10-CM | POA: Diagnosis not present

## 2022-04-27 DIAGNOSIS — E039 Hypothyroidism, unspecified: Secondary | ICD-10-CM | POA: Diagnosis not present

## 2022-04-27 DIAGNOSIS — J441 Chronic obstructive pulmonary disease with (acute) exacerbation: Secondary | ICD-10-CM | POA: Diagnosis not present

## 2022-05-02 NOTE — Progress Notes (Signed)
Carelink Summary Report / Loop Recorder 

## 2022-05-23 ENCOUNTER — Ambulatory Visit (INDEPENDENT_AMBULATORY_CARE_PROVIDER_SITE_OTHER): Payer: Medicare Other

## 2022-05-23 DIAGNOSIS — I639 Cerebral infarction, unspecified: Secondary | ICD-10-CM

## 2022-05-24 LAB — CUP PACEART REMOTE DEVICE CHECK
Date Time Interrogation Session: 20231022232404
Implantable Pulse Generator Implant Date: 20221110

## 2022-05-26 DIAGNOSIS — E039 Hypothyroidism, unspecified: Secondary | ICD-10-CM | POA: Diagnosis not present

## 2022-05-26 DIAGNOSIS — Z720 Tobacco use: Secondary | ICD-10-CM | POA: Diagnosis not present

## 2022-05-26 DIAGNOSIS — E1165 Type 2 diabetes mellitus with hyperglycemia: Secondary | ICD-10-CM | POA: Diagnosis not present

## 2022-05-26 DIAGNOSIS — K5909 Other constipation: Secondary | ICD-10-CM | POA: Diagnosis not present

## 2022-05-26 DIAGNOSIS — J3089 Other allergic rhinitis: Secondary | ICD-10-CM | POA: Diagnosis not present

## 2022-05-26 DIAGNOSIS — I6389 Other cerebral infarction: Secondary | ICD-10-CM | POA: Diagnosis not present

## 2022-05-26 DIAGNOSIS — E785 Hyperlipidemia, unspecified: Secondary | ICD-10-CM | POA: Diagnosis not present

## 2022-05-26 DIAGNOSIS — I2510 Atherosclerotic heart disease of native coronary artery without angina pectoris: Secondary | ICD-10-CM | POA: Diagnosis not present

## 2022-05-26 DIAGNOSIS — J441 Chronic obstructive pulmonary disease with (acute) exacerbation: Secondary | ICD-10-CM | POA: Diagnosis not present

## 2022-05-31 DIAGNOSIS — E039 Hypothyroidism, unspecified: Secondary | ICD-10-CM | POA: Diagnosis not present

## 2022-05-31 DIAGNOSIS — J441 Chronic obstructive pulmonary disease with (acute) exacerbation: Secondary | ICD-10-CM | POA: Diagnosis not present

## 2022-05-31 DIAGNOSIS — E785 Hyperlipidemia, unspecified: Secondary | ICD-10-CM | POA: Diagnosis not present

## 2022-05-31 DIAGNOSIS — I10 Essential (primary) hypertension: Secondary | ICD-10-CM | POA: Diagnosis not present

## 2022-06-20 NOTE — Progress Notes (Signed)
Carelink Summary Report / Loop Recorder 

## 2022-06-23 ENCOUNTER — Emergency Department (HOSPITAL_COMMUNITY): Payer: Medicare Other

## 2022-06-23 ENCOUNTER — Emergency Department (HOSPITAL_COMMUNITY)
Admission: EM | Admit: 2022-06-23 | Discharge: 2022-06-23 | Disposition: A | Discharge status: Home / Self Care | Payer: Medicare Other | Attending: Emergency Medicine | Admitting: Emergency Medicine

## 2022-06-23 ENCOUNTER — Other Ambulatory Visit: Payer: Self-pay

## 2022-06-23 ENCOUNTER — Encounter (HOSPITAL_COMMUNITY): Payer: Self-pay

## 2022-06-23 DIAGNOSIS — Z79899 Other long term (current) drug therapy: Secondary | ICD-10-CM | POA: Diagnosis not present

## 2022-06-23 DIAGNOSIS — Z7982 Long term (current) use of aspirin: Secondary | ICD-10-CM | POA: Insufficient documentation

## 2022-06-23 DIAGNOSIS — Z1152 Encounter for screening for COVID-19: Secondary | ICD-10-CM | POA: Diagnosis not present

## 2022-06-23 DIAGNOSIS — J441 Chronic obstructive pulmonary disease with (acute) exacerbation: Secondary | ICD-10-CM | POA: Insufficient documentation

## 2022-06-23 DIAGNOSIS — I509 Heart failure, unspecified: Secondary | ICD-10-CM | POA: Diagnosis not present

## 2022-06-23 DIAGNOSIS — I110 Hypertensive heart disease with heart failure: Secondary | ICD-10-CM | POA: Diagnosis not present

## 2022-06-23 DIAGNOSIS — R0602 Shortness of breath: Secondary | ICD-10-CM | POA: Diagnosis not present

## 2022-06-23 LAB — COMPREHENSIVE METABOLIC PANEL
ALT: 15 U/L (ref 0–44)
AST: 18 U/L (ref 15–41)
Albumin: 3.3 g/dL — ABNORMAL LOW (ref 3.5–5.0)
Alkaline Phosphatase: 69 U/L (ref 38–126)
Anion gap: 10 (ref 5–15)
BUN: 11 mg/dL (ref 6–20)
CO2: 24 mmol/L (ref 22–32)
Calcium: 9.5 mg/dL (ref 8.9–10.3)
Chloride: 106 mmol/L (ref 98–111)
Creatinine, Ser: 0.82 mg/dL (ref 0.44–1.00)
GFR, Estimated: >60 mL/min (ref >60)
Glucose, Bld: 99 mg/dL (ref 70–99)
Potassium: 3.7 mmol/L (ref 3.5–5.1)
Sodium: 140 mmol/L (ref 135–145)
Total Bilirubin: 0.5 mg/dL (ref 0.3–1.2)
Total Protein: 6.9 g/dL (ref 6.5–8.1)

## 2022-06-23 LAB — CBC WITH DIFFERENTIAL/PLATELET
Abs Immature Granulocytes: 0.01 10*3/uL (ref 0.00–0.07)
Basophils Absolute: 0 10*3/uL (ref 0.0–0.1)
Basophils Relative: 1 %
Eosinophils Absolute: 0.1 10*3/uL (ref 0.0–0.5)
Eosinophils Relative: 1 %
HCT: 40.6 % (ref 36.0–46.0)
Hemoglobin: 13.5 g/dL (ref 12.0–15.0)
Immature Granulocytes: 0 %
Lymphocytes Relative: 39 %
Lymphs Abs: 2.2 10*3/uL (ref 0.7–4.0)
MCH: 29.4 pg (ref 26.0–34.0)
MCHC: 33.3 g/dL (ref 30.0–36.0)
MCV: 88.5 fL (ref 80.0–100.0)
Monocytes Absolute: 0.5 10*3/uL (ref 0.1–1.0)
Monocytes Relative: 8 %
Neutro Abs: 3 10*3/uL (ref 1.7–7.7)
Neutrophils Relative %: 51 %
Platelets: 262 10*3/uL (ref 150–400)
RBC: 4.59 MIL/uL (ref 3.87–5.11)
RDW: 13.4 % (ref 11.5–15.5)
WBC: 5.8 10*3/uL (ref 4.0–10.5)
nRBC: 0 % (ref 0.0–0.2)

## 2022-06-23 LAB — RESP PANEL BY RT-PCR (FLU A&B, COVID) ARPGX2
Influenza A by PCR: NEGATIVE
Influenza B by PCR: NEGATIVE
SARS Coronavirus 2 by RT PCR: NEGATIVE

## 2022-06-23 LAB — BRAIN NATRIURETIC PEPTIDE: B Natriuretic Peptide: 49.7 pg/mL (ref 0.0–100.0)

## 2022-06-23 MED ORDER — METHYLPREDNISOLONE SODIUM SUCC 125 MG IJ SOLR
125.0000 mg | Freq: Once | INTRAMUSCULAR | Status: AC
  Administered 2022-06-23: 125 mg via INTRAVENOUS
  Filled 2022-06-23: qty 2

## 2022-06-23 MED ORDER — BENZONATATE 100 MG PO CAPS: 100.0000 mg | ORAL_CAPSULE | Freq: Three times a day (TID) | ORAL | 0 refills | Status: DC

## 2022-06-23 MED ORDER — AZITHROMYCIN 250 MG PO TABS: 250.0000 mg | ORAL_TABLET | Freq: Every day | ORAL | 0 refills | Status: AC

## 2022-06-23 MED ORDER — PREDNISONE 20 MG PO TABS: 40.0000 mg | ORAL_TABLET | Freq: Every day | ORAL | 0 refills | Status: DC

## 2022-06-23 MED ORDER — SODIUM CHLORIDE 0.9 % IV SOLN
500.0000 mg | Freq: Once | INTRAVENOUS | Status: AC
  Administered 2022-06-23: 500 mg via INTRAVENOUS
  Filled 2022-06-23: qty 5

## 2022-06-23 NOTE — ED Notes (Signed)
DC instructions reviewed with pt. Pt verbalized understanding.  PT DC.  

## 2022-06-23 NOTE — ED Triage Notes (Signed)
Pt arrived POV from home c/o [facility] that started 2 days ago. Pt also endorses a headache and being congested. Pt states has severe COPD and did not want to cause an attack. Pt also states she has body aches and states maybe it is the flu.

## 2022-06-23 NOTE — ED Provider Notes (Signed)
[facility] EMERGENCY DEPARTMENT Provider Note   CSN: [id number] Arrival date & time: 06/23/22  0759     History  Chief Complaint  Patient presents with   Shortness of Breath    Jocelyn [name] is a 61 y.o. female.  HPI Patient presents from home with dyspnea.  She has multiple medical issues including heart failure, COPD, hypertension.  2 days ago she began having shortness of breath, but attempted to improve with home albuterol.  This was unsuccessful and today she presents for evaluation.  No chest pain, no abdominal pain, no fever, no vomiting.    Home Medications Prior to Admission medications   Medication Sig Start Date End Date Taking? Authorizing Provider  albuterol (PROVENTIL HFA;VENTOLIN HFA) 108 (90 Base) MCG/ACT inhaler Inhale 2 puffs into the lungs every 6 (six) hours as needed for wheezing or shortness of breath. 06/21/15 12/13/21  [provider]  ALPRAZolam [name]) 1 MG tablet Take 1 mg by mouth daily. 05/10/20   [provider]  aspirin EC 81 MG tablet Take 81 mg by mouth daily. Swallow whole.    [provider]  furosemide (LASIX) 40 MG tablet Take 40 mg by mouth daily.    [provider]  gabapentin (NEURONTIN) 300 MG capsule Take 300 mg by mouth at bedtime as needed (pain). 10/05/21   [provider]  ipratropium-albuterol (DUONEB) 0.5-2.5 (3) MG/3ML SOLN Take 3 mLs by nebulization every 6 (six) hours as needed. Patient taking differently: Take 3 mLs by nebulization every 6 (six) hours as needed (shortness of breath, wheezing). 05/21/21   [name], [name], MD  levothyroxine (SYNTHROID) 100 MCG tablet Take 100 mcg by mouth daily. 11/02/21   [provider]  MELATONIN PO Take 10-20 mg by mouth at bedtime as needed (sleep).    [provider]  Methylsulfonylmethane (MSM) 1000 MG TABS Take 1,000 mg by mouth daily.    [provider]  metoprolol tartrate (LOPRESSOR) 25 MG tablet  Take 25 mg by mouth daily. 10/05/21   [provider]  nitroGLYCERIN (NITROSTAT) 0.3 MG SL tablet Place 0.3 mg under the tongue every 5 (five) minutes as needed for chest pain. 10/28/15   [provider]  potassium chloride SA (KLOR-CON M) 20 MEQ tablet Take 2 tablets (40 mEq total) by mouth daily for 7 days. 12/13/21 12/20/21  Mesner, Corene Cornea, MD  triamcinolone cream (KENALOG) 0.5 % Apply 1 application topically daily as needed (for sun exposure).    [provider]      Allergies    Hydrocodone    Review of Systems   Review of Systems  All other systems reviewed and are negative.   Physical Exam Updated Vital Signs BP 100/63   Pulse 81   Temp 98.4 F (36.9 C)   Resp (!) 21   SpO2 97%  Physical Exam Vitals and nursing note reviewed.  Constitutional:      General: She is not in acute distress.    Appearance: She is well-developed.  HENT:     Head: Normocephalic and atraumatic.  Eyes:     Conjunctiva/sclera: Conjunctivae normal.  Cardiovascular:     Rate and Rhythm: Normal rate and regular rhythm.  Pulmonary:     Effort: Pulmonary effort is normal. No respiratory distress.     Breath sounds: No stridor. Decreased breath sounds present. No wheezing.  Abdominal:     General: There is no distension.  Skin:    General: Skin is warm and  dry.  Neurological:     Mental Status: She is alert and oriented to person, place, and time.     Cranial Nerves: No cranial nerve deficit.  Psychiatric:        Mood and Affect: Mood normal.     ED Results / Procedures / Treatments   Labs (all labs ordered are listed, but only abnormal results are displayed) Labs Reviewed  COMPREHENSIVE METABOLIC PANEL - Abnormal; Notable for the following components:      Result Value   Albumin 3.3 (*)    All other components within normal limits  RESP PANEL BY RT-PCR (FLU A&B, COVID) ARPGX2  CBC WITH DIFFERENTIAL/PLATELET  BRAIN NATRIURETIC PEPTIDE    EKG EKG  Interpretation  Date/Time:  Thursday June 23 2022 08:19:35 EST Ventricular Rate:  83 PR Interval:  148 QRS Duration: 94 QT Interval:  387 QTC Calculation: 455 R Axis:   61 Text Interpretation: Sinus rhythm Probable left atrial enlargement Abnormal R-wave progression, early transition Abnormal ECG Confirmed by Carmin Muskrat [phone]) on 06/23/2022 8:20:30 AM  Radiology DG Chest Port 1 View  Result Date: 06/23/2022 CLINICAL DATA:  Shortness of breath, flu-like symptoms for past few days. EXAM: PORTABLE CHEST 1 VIEW COMPARISON:  Chest radiograph dated Dec 12, 2021 FINDINGS: The heart size and mediastinal contours are within normal limits. Loop recorder projected over the left hemithorax. Both lungs are clear. No acute osseous abnormality. IMPRESSION: No active disease. Electronically Signed   By: Keane Police D.O.   On: 06/23/2022 09:01    Procedures Procedures    Medications Ordered in ED Medications  azithromycin (ZITHROMAX) 500 mg in sodium chloride 0.9 % 250 mL IVPB (has no administration in time range)  methylPREDNISolone sodium succinate (SOLU-MEDROL) 125 mg/2 mL injection 125 mg (has no administration in time range)    ED Course/ Medical Decision Making/ A&P This patient with a Hx of prior stroke, COPD, heart failure presents to the ED for concern of dyspnea, flulike illness, this involves an extensive number of treatment options, and is a complaint that carries with it a high risk of complications and morbidity.    The differential diagnosis includes COVID, influenza, pneumonia, COPD exacerbation, heart failure exacerbation   Social Determinants of Health:  No limits  Additional history obtained:  Additional history and/or information obtained from chart review, notable for ED eval following stroke previously   After the initial evaluation, orders, including: Labs monitoring x-ray were initiated.   Patient placed on Cardiac and Pulse-Oximetry Monitors. The  patient was maintained on a cardiac monitor.  The cardiac monitored showed an rhythm of 80 sinus normal The patient was also maintained on pulse oximetry. The readings were typically 97% room air normal   On repeat evaluation of the patient improved Update: Patient calm, in no distress, speaking on the telephone with her daughter, no increased work of breathing, clear breathing   Lab Tests:  I personally interpreted labs.  The pertinent results include: Unremarkable, no leukocytosis, electrolytes unremarkable, normal BNP, COVID, flu negative  Imaging Studies ordered:  I independently visualized and interpreted imaging which showed no pneumonia,  I agree with the radiologist interpretation  12:00 PM  Dispostion / Final MDM:  After consideration of the diagnostic results and the patient's response to treatment, is awake, alert, has no increased work of breathing, lung sounds are clear, vital signs essentially unremarkable, patient in no distress.  Some suspicion for COPD exacerbation versus bronchitis given the reassuring evaluation.  No evidence for bacteremia, sepsis,  no evidence for ACS.  Hospitalization a consideration, but d/c is reasonable. Patient will start steroids, follow-up with her physician.  Final Clinical Impression(s) / ED Diagnoses Final diagnoses:  COPD exacerbation [facility])    Rx / DC Orders ED Discharge Orders     None         Carmin Muskrat, MD 06/23/22 1201

## 2022-06-23 NOTE — Discharge Instructions (Signed)
As discussed, today's evaluation has been generally reassuring.  No evidence for pneumonia, or complications from your heart are currently present.  If you develop new, or concerning changes, however, do not hesitate to return here, or see your physician.  Otherwise, take all medication as directed and schedule outpatient follow-up with your physician.

## 2022-06-27 ENCOUNTER — Ambulatory Visit (INDEPENDENT_AMBULATORY_CARE_PROVIDER_SITE_OTHER): Payer: Medicare Other

## 2022-06-27 DIAGNOSIS — I639 Cerebral infarction, unspecified: Secondary | ICD-10-CM | POA: Diagnosis not present

## 2022-06-28 LAB — CUP PACEART REMOTE DEVICE CHECK
Date Time Interrogation Session: 20231126231947
Implantable Pulse Generator Implant Date: 20221110

## 2022-06-30 DIAGNOSIS — J441 Chronic obstructive pulmonary disease with (acute) exacerbation: Secondary | ICD-10-CM | POA: Diagnosis not present

## 2022-06-30 DIAGNOSIS — E039 Hypothyroidism, unspecified: Secondary | ICD-10-CM | POA: Diagnosis not present

## 2022-06-30 DIAGNOSIS — E785 Hyperlipidemia, unspecified: Secondary | ICD-10-CM | POA: Diagnosis not present

## 2022-06-30 DIAGNOSIS — I10 Essential (primary) hypertension: Secondary | ICD-10-CM | POA: Diagnosis not present

## 2022-07-27 DIAGNOSIS — H5213 Myopia, bilateral: Secondary | ICD-10-CM | POA: Diagnosis not present

## 2022-07-28 ENCOUNTER — Ambulatory Visit (INDEPENDENT_AMBULATORY_CARE_PROVIDER_SITE_OTHER): Payer: Medicare Other

## 2022-07-28 DIAGNOSIS — I639 Cerebral infarction, unspecified: Secondary | ICD-10-CM | POA: Diagnosis not present

## 2022-07-28 LAB — CUP PACEART REMOTE DEVICE CHECK
Date Time Interrogation Session: 20231227231744
Implantable Pulse Generator Implant Date: 20221110

## 2022-07-31 DIAGNOSIS — I10 Essential (primary) hypertension: Secondary | ICD-10-CM | POA: Diagnosis not present

## 2022-07-31 DIAGNOSIS — J441 Chronic obstructive pulmonary disease with (acute) exacerbation: Secondary | ICD-10-CM | POA: Diagnosis not present

## 2022-07-31 DIAGNOSIS — E785 Hyperlipidemia, unspecified: Secondary | ICD-10-CM | POA: Diagnosis not present

## 2022-07-31 DIAGNOSIS — E039 Hypothyroidism, unspecified: Secondary | ICD-10-CM | POA: Diagnosis not present

## 2022-08-08 NOTE — Progress Notes (Signed)
Carelink Summary Report / Loop Recorder 

## 2022-08-16 NOTE — Progress Notes (Signed)
[facility] Summary Report / Loop Recorder

## 2022-08-23 DIAGNOSIS — J441 Chronic obstructive pulmonary disease with (acute) exacerbation: Secondary | ICD-10-CM | POA: Diagnosis not present

## 2022-08-23 DIAGNOSIS — E039 Hypothyroidism, unspecified: Secondary | ICD-10-CM | POA: Diagnosis not present

## 2022-08-23 DIAGNOSIS — I10 Essential (primary) hypertension: Secondary | ICD-10-CM | POA: Diagnosis not present

## 2022-08-23 DIAGNOSIS — E785 Hyperlipidemia, unspecified: Secondary | ICD-10-CM | POA: Diagnosis not present

## 2022-08-23 DIAGNOSIS — M7552 Bursitis of left shoulder: Secondary | ICD-10-CM | POA: Diagnosis not present

## 2022-08-23 DIAGNOSIS — I2510 Atherosclerotic heart disease of native coronary artery without angina pectoris: Secondary | ICD-10-CM | POA: Diagnosis not present

## 2022-08-30 ENCOUNTER — Ambulatory Visit: Payer: 59

## 2022-08-30 DIAGNOSIS — I639 Cerebral infarction, unspecified: Secondary | ICD-10-CM | POA: Diagnosis not present

## 2022-08-30 LAB — CUP PACEART REMOTE DEVICE CHECK
Date Time Interrogation Session: 20240127230640
Implantable Pulse Generator Implant Date: 20221110

## 2022-08-31 DIAGNOSIS — E785 Hyperlipidemia, unspecified: Secondary | ICD-10-CM | POA: Diagnosis not present

## 2022-08-31 DIAGNOSIS — E039 Hypothyroidism, unspecified: Secondary | ICD-10-CM | POA: Diagnosis not present

## 2022-08-31 DIAGNOSIS — J441 Chronic obstructive pulmonary disease with (acute) exacerbation: Secondary | ICD-10-CM | POA: Diagnosis not present

## 2022-08-31 DIAGNOSIS — I10 Essential (primary) hypertension: Secondary | ICD-10-CM | POA: Diagnosis not present

## 2022-09-01 DIAGNOSIS — J40 Bronchitis, not specified as acute or chronic: Secondary | ICD-10-CM | POA: Diagnosis not present

## 2022-09-01 DIAGNOSIS — J028 Acute pharyngitis due to other specified organisms: Secondary | ICD-10-CM | POA: Diagnosis not present

## 2022-09-02 ENCOUNTER — Other Ambulatory Visit: Payer: Self-pay

## 2022-09-02 ENCOUNTER — Encounter (HOSPITAL_COMMUNITY): Payer: Self-pay

## 2022-09-02 ENCOUNTER — Emergency Department (HOSPITAL_COMMUNITY): Payer: 59

## 2022-09-02 ENCOUNTER — Inpatient Hospital Stay (HOSPITAL_COMMUNITY)
Admission: EM | Admit: 2022-09-02 | Discharge: 2022-09-04 | DRG: 871 | Disposition: A | Discharge status: Home / Self Care | Payer: 59 | Attending: Family Medicine | Admitting: Family Medicine

## 2022-09-02 DIAGNOSIS — E039 Hypothyroidism, unspecified: Secondary | ICD-10-CM | POA: Diagnosis present

## 2022-09-02 DIAGNOSIS — R6520 Severe sepsis without septic shock: Secondary | ICD-10-CM | POA: Diagnosis present

## 2022-09-02 DIAGNOSIS — J440 Chronic obstructive pulmonary disease with acute lower respiratory infection: Secondary | ICD-10-CM | POA: Diagnosis not present

## 2022-09-02 DIAGNOSIS — J159 Unspecified bacterial pneumonia: Secondary | ICD-10-CM | POA: Diagnosis present

## 2022-09-02 DIAGNOSIS — Z955 Presence of coronary angioplasty implant and graft: Secondary | ICD-10-CM

## 2022-09-02 DIAGNOSIS — I5032 Chronic diastolic (congestive) heart failure: Secondary | ICD-10-CM | POA: Diagnosis not present

## 2022-09-02 DIAGNOSIS — Z823 Family history of stroke: Secondary | ICD-10-CM

## 2022-09-02 DIAGNOSIS — R509 Fever, unspecified: Secondary | ICD-10-CM | POA: Diagnosis not present

## 2022-09-02 DIAGNOSIS — Z7982 Long term (current) use of aspirin: Secondary | ICD-10-CM

## 2022-09-02 DIAGNOSIS — J441 Chronic obstructive pulmonary disease with (acute) exacerbation: Principal | ICD-10-CM

## 2022-09-02 DIAGNOSIS — Z1152 Encounter for screening for COVID-19: Secondary | ICD-10-CM

## 2022-09-02 DIAGNOSIS — E7800 Pure hypercholesterolemia, unspecified: Secondary | ICD-10-CM | POA: Diagnosis present

## 2022-09-02 DIAGNOSIS — I252 Old myocardial infarction: Secondary | ICD-10-CM | POA: Diagnosis not present

## 2022-09-02 DIAGNOSIS — F419 Anxiety disorder, unspecified: Secondary | ICD-10-CM | POA: Diagnosis present

## 2022-09-02 DIAGNOSIS — N179 Acute kidney failure, unspecified: Secondary | ICD-10-CM | POA: Diagnosis present

## 2022-09-02 DIAGNOSIS — Z885 Allergy status to narcotic agent status: Secondary | ICD-10-CM

## 2022-09-02 DIAGNOSIS — I10 Essential (primary) hypertension: Secondary | ICD-10-CM | POA: Diagnosis present

## 2022-09-02 DIAGNOSIS — I2510 Atherosclerotic heart disease of native coronary artery without angina pectoris: Secondary | ICD-10-CM | POA: Diagnosis present

## 2022-09-02 DIAGNOSIS — F17210 Nicotine dependence, cigarettes, uncomplicated: Secondary | ICD-10-CM | POA: Diagnosis not present

## 2022-09-02 DIAGNOSIS — Z79899 Other long term (current) drug therapy: Secondary | ICD-10-CM | POA: Diagnosis not present

## 2022-09-02 DIAGNOSIS — E872 Acidosis, unspecified: Secondary | ICD-10-CM | POA: Diagnosis present

## 2022-09-02 DIAGNOSIS — Z79890 Hormone replacement therapy (postmenopausal): Secondary | ICD-10-CM

## 2022-09-02 DIAGNOSIS — Z8249 Family history of ischemic heart disease and other diseases of the circulatory system: Secondary | ICD-10-CM

## 2022-09-02 DIAGNOSIS — M1990 Unspecified osteoarthritis, unspecified site: Secondary | ICD-10-CM | POA: Diagnosis present

## 2022-09-02 DIAGNOSIS — B9729 Other coronavirus as the cause of diseases classified elsewhere: Secondary | ICD-10-CM | POA: Diagnosis present

## 2022-09-02 DIAGNOSIS — R0602 Shortness of breath: Secondary | ICD-10-CM | POA: Diagnosis not present

## 2022-09-02 DIAGNOSIS — R062 Wheezing: Secondary | ICD-10-CM | POA: Diagnosis not present

## 2022-09-02 DIAGNOSIS — Z743 Need for continuous supervision: Secondary | ICD-10-CM | POA: Diagnosis not present

## 2022-09-02 DIAGNOSIS — I499 Cardiac arrhythmia, unspecified: Secondary | ICD-10-CM | POA: Diagnosis not present

## 2022-09-02 DIAGNOSIS — A419 Sepsis, unspecified organism: Principal | ICD-10-CM | POA: Diagnosis present

## 2022-09-02 DIAGNOSIS — R0902 Hypoxemia: Secondary | ICD-10-CM | POA: Diagnosis not present

## 2022-09-02 DIAGNOSIS — E890 Postprocedural hypothyroidism: Secondary | ICD-10-CM | POA: Diagnosis present

## 2022-09-02 DIAGNOSIS — Z8673 Personal history of transient ischemic attack (TIA), and cerebral infarction without residual deficits: Secondary | ICD-10-CM | POA: Diagnosis not present

## 2022-09-02 DIAGNOSIS — F32A Depression, unspecified: Secondary | ICD-10-CM | POA: Diagnosis not present

## 2022-09-02 DIAGNOSIS — R6889 Other general symptoms and signs: Secondary | ICD-10-CM | POA: Diagnosis not present

## 2022-09-02 DIAGNOSIS — I110 Hypertensive heart disease with heart failure: Secondary | ICD-10-CM | POA: Diagnosis present

## 2022-09-02 LAB — CBC
HCT: 41.7 % (ref 36.0–46.0)
Hemoglobin: 14.2 g/dL (ref 12.0–15.0)
MCH: 29.6 pg (ref 26.0–34.0)
MCHC: 34.1 g/dL (ref 30.0–36.0)
MCV: 87.1 fL (ref 80.0–100.0)
Platelets: 194 10*3/uL (ref 150–400)
RBC: 4.79 MIL/uL (ref 3.87–5.11)
RDW: 14.2 % (ref 11.5–15.5)
WBC: 8.7 10*3/uL (ref 4.0–10.5)
nRBC: 0 % (ref 0.0–0.2)

## 2022-09-02 LAB — COMPREHENSIVE METABOLIC PANEL
ALT: 21 U/L (ref 0–44)
AST: 26 U/L (ref 15–41)
Albumin: 3.5 g/dL (ref 3.5–5.0)
Alkaline Phosphatase: 61 U/L (ref 38–126)
Anion gap: 14 (ref 5–15)
BUN: 10 mg/dL (ref 8–23)
CO2: 23 mmol/L (ref 22–32)
Calcium: 9.1 mg/dL (ref 8.9–10.3)
Chloride: 102 mmol/L (ref 98–111)
Creatinine, Ser: 0.95 mg/dL (ref 0.44–1.00)
GFR, Estimated: >60 mL/min (ref >60)
Glucose, Bld: 135 mg/dL — ABNORMAL HIGH (ref 70–99)
Potassium: 3.3 mmol/L — ABNORMAL LOW (ref 3.5–5.1)
Sodium: 139 mmol/L (ref 135–145)
Total Bilirubin: 0.6 mg/dL (ref 0.3–1.2)
Total Protein: 6.8 g/dL (ref 6.5–8.1)

## 2022-09-02 LAB — TSH: TSH: 0.247 u[IU]/mL — ABNORMAL LOW (ref 0.350–4.500)

## 2022-09-02 LAB — BRAIN NATRIURETIC PEPTIDE: B Natriuretic Peptide: 67.8 pg/mL (ref 0.0–100.0)

## 2022-09-02 LAB — LACTIC ACID, PLASMA: Lactic Acid, Venous: 2.5 mmol/L (ref 0.5–1.9)

## 2022-09-02 LAB — TROPONIN I (HIGH SENSITIVITY): Troponin I (High Sensitivity): 15 ng/L (ref <18)

## 2022-09-02 LAB — RESP PANEL BY RT-PCR (RSV, FLU A&B, COVID)  RVPGX2
Influenza A by PCR: NEGATIVE
Influenza B by PCR: NEGATIVE
Resp Syncytial Virus by PCR: NEGATIVE
SARS Coronavirus 2 by RT PCR: NEGATIVE

## 2022-09-02 MED ORDER — ALBUTEROL SULFATE (2.5 MG/3ML) 0.083% IN NEBU
5.0000 mg | INHALATION_SOLUTION | Freq: Once | RESPIRATORY_TRACT | Status: AC
  Administered 2022-09-02: 5 mg via RESPIRATORY_TRACT
  Filled 2022-09-02: qty 6

## 2022-09-02 MED ORDER — POTASSIUM CHLORIDE CRYS ER 20 MEQ PO TBCR
40.0000 meq | EXTENDED_RELEASE_TABLET | Freq: Once | ORAL | Status: AC
  Administered 2022-09-02: 40 meq via ORAL
  Filled 2022-09-02: qty 2

## 2022-09-02 MED ORDER — SODIUM CHLORIDE 0.9 % IV SOLN
500.0000 mg | Freq: Once | INTRAVENOUS | Status: AC
  Administered 2022-09-02: 500 mg via INTRAVENOUS
  Filled 2022-09-02: qty 5

## 2022-09-02 MED ORDER — SODIUM CHLORIDE 0.9 % IV SOLN
2.0000 g | Freq: Once | INTRAVENOUS | Status: AC
  Administered 2022-09-02: 2 g via INTRAVENOUS
  Filled 2022-09-02: qty 20

## 2022-09-02 MED ORDER — ACETAMINOPHEN 500 MG PO TABS
1000.0000 mg | ORAL_TABLET | Freq: Once | ORAL | Status: AC
  Administered 2022-09-02: 1000 mg via ORAL
  Filled 2022-09-02: qty 2

## 2022-09-02 MED ORDER — IPRATROPIUM BROMIDE 0.02 % IN SOLN
0.5000 mg | Freq: Once | RESPIRATORY_TRACT | Status: AC
  Administered 2022-09-02: 0.5 mg via RESPIRATORY_TRACT
  Filled 2022-09-02: qty 2.5

## 2022-09-02 MED ORDER — IBUPROFEN 400 MG PO TABS
400.0000 mg | ORAL_TABLET | Freq: Once | ORAL | Status: AC
  Administered 2022-09-02: 400 mg via ORAL
  Filled 2022-09-02: qty 1

## 2022-09-02 MED ORDER — LACTATED RINGERS IV BOLUS
1000.0000 mL | Freq: Once | INTRAVENOUS | Status: AC
  Administered 2022-09-02: 1000 mL via INTRAVENOUS

## 2022-09-02 NOTE — ED Provider Notes (Addendum)
[facility] Provider Note   CSN: [id number] Arrival date & time: 09/02/22  2137     History  Chief Complaint  Patient presents with   Shortness of Breath    Jocelyn [name] is a 62 y.o. female.  Patient w hx copd, c/o fever, prod cough, sob/wheezing in the past 2-3 days. Symptoms acute onset, mod-severe, felt worse tonight. No chest pain. No sore throat. +nasal congestion/rhinorrhea. No sinus pain or headache. No neck pain or stiffness. No abd pain or nvd. No dysuria or gu c/o. No extremity pain or swelling. No specific known ill contacts or known covid/flu exposure. EMS gave neb tx x 2 and solumedrol iv. +smoker.   The history is provided by the patient, medical records and the EMS personnel.  Shortness of Breath Associated symptoms: cough, fever and wheezing   Associated symptoms: no abdominal pain, no chest pain, no headaches, no neck pain, no rash, no sore throat and no vomiting        Home Medications Prior to Admission medications   Medication Sig Start Date End Date Taking? Authorizing Provider  albuterol (PROVENTIL HFA;VENTOLIN HFA) 108 (90 Base) MCG/ACT inhaler Inhale 2 puffs into the lungs every 6 (six) hours as needed for wheezing or shortness of breath. 06/21/15 12/13/21  [provider]  ALPRAZolam [name]) 1 MG tablet Take 1 mg by mouth daily. 05/10/20   [provider]  aspirin EC 81 MG tablet Take 81 mg by mouth daily. Swallow whole.    [provider]  benzonatate (TESSALON) 100 MG capsule Take 1 capsule (100 mg total) by mouth every 8 (eight) hours. 06/23/22   Carmin Muskrat, MD  furosemide (LASIX) 40 MG tablet Take 40 mg by mouth daily.    [provider]  gabapentin (NEURONTIN) 300 MG capsule Take 300 mg by mouth at bedtime as needed (pain). 10/05/21   [provider]  ipratropium-albuterol (DUONEB) 0.5-2.5 (3) MG/3ML SOLN Take 3 mLs by nebulization every 6 (six) hours as  needed. Patient taking differently: Take 3 mLs by nebulization every 6 (six) hours as needed (shortness of breath, wheezing). 05/21/21   [name], [name], MD  levothyroxine (SYNTHROID) 100 MCG tablet Take 100 mcg by mouth daily. 11/02/21   [provider]  MELATONIN PO Take 10-20 mg by mouth at bedtime as needed (sleep).    [provider]  Methylsulfonylmethane (MSM) 1000 MG TABS Take 1,000 mg by mouth daily.    [provider]  metoprolol tartrate (LOPRESSOR) 25 MG tablet Take 25 mg by mouth daily. 10/05/21   [provider]  nitroGLYCERIN (NITROSTAT) 0.3 MG SL tablet Place 0.3 mg under the tongue every 5 (five) minutes as needed for chest pain. 10/28/15   [provider]  potassium chloride SA (KLOR-CON M) 20 MEQ tablet Take 2 tablets (40 mEq total) by mouth daily for 7 days. 12/13/21 12/20/21  Mesner, Corene Cornea, MD  predniSONE (DELTASONE) 20 MG tablet Take 2 tablets (40 mg total) by mouth daily with breakfast. For the next four days 06/23/22   Carmin Muskrat, MD  triamcinolone cream (KENALOG) 0.5 % Apply 1 application topically daily as needed (for sun exposure).    [provider]      Allergies    Hydrocodone    Review of Systems   Review of Systems  Constitutional:  Positive for fever.  HENT:  Positive for congestion and rhinorrhea. Negative for sore throat.   Eyes:  Negative for redness.  Respiratory:  Positive for cough, shortness of breath and wheezing.   Cardiovascular:  Negative for chest pain, palpitations and leg swelling.  Gastrointestinal:  Negative for abdominal pain, diarrhea and vomiting.  Genitourinary:  Negative for dysuria and flank pain.  Musculoskeletal:  Negative for back pain, neck pain and neck stiffness.  Skin:  Negative for rash.  Neurological:  Negative for headaches.  Hematological:  Does not bruise/bleed easily.  Psychiatric/Behavioral:  Negative for confusion.     Physical Exam Updated Vital Signs BP  132/68   Pulse (!) 138   Temp (!) 103 F (39.4 C) (Oral)   Resp (!) 24   SpO2 92%  Physical Exam Vitals and nursing note reviewed.  Constitutional:      Appearance: Normal appearance. She is well-developed.  HENT:     Head: Atraumatic.     Nose: Nose normal.     Mouth/Throat:     Mouth: Mucous membranes are moist.  Eyes:     General: No scleral icterus.    Conjunctiva/sclera: Conjunctivae normal.     Pupils: Pupils are equal, round, and reactive to light.  Neck:     Trachea: No tracheal deviation.     Comments: No stiffness or rigidity Cardiovascular:     Rate and Rhythm: Regular rhythm. Tachycardia present.     Pulses: Normal pulses.     Heart sounds: Normal heart sounds. No murmur heard.    No friction rub. No gallop.  Pulmonary:     Effort: Respiratory distress present.     Breath sounds: Wheezing present.  Abdominal:     General: There is no distension.     Palpations: Abdomen is soft.     Tenderness: There is no abdominal tenderness.  Genitourinary:    Comments: No cva tenderness.  Musculoskeletal:        General: No swelling or tenderness.     Cervical back: Normal range of motion and neck supple. No rigidity. No muscular tenderness.     Right lower leg: No edema.     Left lower leg: No edema.  Skin:    General: Skin is warm and dry.     Findings: No rash.  Neurological:     Mental Status: She is alert.     Comments: Alert, speech normal.   Psychiatric:        Mood and Affect: Mood normal.     ED Results / Procedures / Treatments   Labs (all labs ordered are listed, but only abnormal results are displayed) Results for orders placed or performed during the hospital encounter of 09/02/22  CBC  Result Value Ref Range   WBC 8.7 4.0 - 10.5 K/uL   RBC 4.79 3.87 - 5.11 MIL/uL   Hemoglobin 14.2 12.0 - 15.0 g/dL   HCT 41.7 36.0 - 46.0 %   MCV 87.1 80.0 - 100.0 fL   MCH 29.6 26.0 - 34.0 pg   MCHC 34.1 30.0 - 36.0 g/dL   RDW 14.2 11.5 - 15.5 %   Platelets  194 150 - 400 K/uL   nRBC 0.0 0.0 - 0.2 %  Comprehensive metabolic panel  Result Value Ref Range   Sodium 139 135 - 145 mmol/L   Potassium 3.3 (L) 3.5 - 5.1 mmol/L   Chloride 102 98 - 111 mmol/L   CO2 23 22 - 32 mmol/L   Glucose, Bld 135 (H) 70 - 99 mg/dL   BUN 10 8 - 23 mg/dL   Creatinine, Ser 0.95 0.44 - 1.00  mg/dL   Calcium 9.1 8.9 - 10.3 mg/dL   Total Protein 6.8 6.5 - 8.1 g/dL   Albumin 3.5 3.5 - 5.0 g/dL   AST 26 15 - 41 U/L   ALT 21 0 - 44 U/L   Alkaline Phosphatase 61 38 - 126 U/L   Total Bilirubin 0.6 0.3 - 1.2 mg/dL   GFR, Estimated >60 >60 mL/min   Anion gap 14 5 - 15  Brain natriuretic peptide  Result Value Ref Range   B Natriuretic Peptide 67.8 0.0 - 100.0 pg/mL  Lactic acid, plasma  Result Value Ref Range   Lactic Acid, Venous 2.5 (HH) 0.5 - 1.9 mmol/L   DG Chest Port 1 View  Result Date: 09/02/2022 CLINICAL DATA:  Short of breath EXAM: PORTABLE CHEST 1 VIEW COMPARISON:  06/23/2022 FINDINGS: Single frontal view of the chest demonstrates a stable cardiac silhouette. No airspace disease, effusion, or pneumothorax. No acute displaced fracture. Stable loop recorder. IMPRESSION: 1. No acute intrathoracic process. Electronically Signed   By: Randa Ngo M.D.   On: 09/02/2022 22:23   CUP PACEART REMOTE DEVICE CHECK  Result Date: 08/30/2022 ILR summary report received. Battery status OK. Normal device function. No new symptom, tachy, brady, or pause episodes. No new AF episodes. Monthly summary reports and ROV/PRN- JJB    EKG None  Radiology DG Chest Port 1 View  Result Date: 09/02/2022 CLINICAL DATA:  Short of breath EXAM: PORTABLE CHEST 1 VIEW COMPARISON:  06/23/2022 FINDINGS: Single frontal view of the chest demonstrates a stable cardiac silhouette. No airspace disease, effusion, or pneumothorax. No acute displaced fracture. Stable loop recorder. IMPRESSION: 1. No acute intrathoracic process. Electronically Signed   By: Randa Ngo M.D.   On: 09/02/2022 22:23     Procedures Procedures    Medications Ordered in ED Medications  azithromycin (ZITHROMAX) 500 mg in sodium chloride 0.9 % 250 mL IVPB (500 mg Intravenous New Bag/Given 09/02/22 2240)  albuterol (PROVENTIL) (2.5 MG/3ML) 0.083% nebulizer solution 5 mg (has no administration in time range)  ipratropium (ATROVENT) nebulizer solution 0.5 mg (has no administration in time range)  lactated ringers bolus 1,000 mL (has no administration in time range)  potassium chloride SA (KLOR-CON M) CR tablet 40 mEq (has no administration in time range)  albuterol (PROVENTIL) (2.5 MG/3ML) 0.083% nebulizer solution 5 mg (5 mg Nebulization Given 09/02/22 2214)  ipratropium (ATROVENT) nebulizer solution 0.5 mg (0.5 mg Nebulization Given 09/02/22 2213)  cefTRIAXone (ROCEPHIN) 2 g in sodium chloride 0.9 % 100 mL IVPB (2 g Intravenous New Bag/Given 09/02/22 2230)  acetaminophen (TYLENOL) tablet 1,000 mg (1,000 mg Oral Given 09/02/22 2255)  ibuprofen (ADVIL) tablet 400 mg (400 mg Oral Given 09/02/22 2255)    ED Course/ Medical Decision Making/ A&P                             Medical Decision Making Problems Addressed: Acute febrile illness: acute illness or injury with systemic symptoms that poses a threat to life or bodily functions COPD exacerbation ([place]): acute illness or injury with systemic symptoms that poses a threat to life or bodily functions  Amount and/or Complexity of Data Reviewed Independent Historian: EMS    Details: hx External Data Reviewed: notes. Labs: ordered. Decision-making details documented in ED Course. Radiology: ordered and independent interpretation performed. Decision-making details documented in ED Course. ECG/medicine tests: ordered and independent interpretation performed. Decision-making details documented in ED Course. Discussion of management or test interpretation  with external provider(s): medicine  Risk OTC drugs. Prescription drug management. Decision regarding  hospitalization.   Iv ns. Continuous pulse ox and cardiac monitoring. Labs ordered/sent. Imaging ordered.   Differential diagnosis includes pna, covid, flu, copd exacerbation, ARF, etc . Dispo decision including potential need for admission considered - will get labs and imaging and reassess.   Reviewed nursing notes and prior charts for additional history. External reports reviewed. Additional history from: EMS.   Cardiac monitor: sinus rhythm, rate 120.  Labs reviewed/interpreted by me - wbc and hgb normal. Lactate mildly high - ivf bolus.   Acetaminophen po, ibuprofen po. Po fluids.   Xrays reviewed/interpreted by me - no pna.   EMS gave solumedrol and alb tx x 2. Additional albuterol and atrovent tx.   Wheezing improved but persists, pt feels mildly improved from prior. Additional albuterol/atrovent neb.   Will consult hospitalists for admission. Discussed pt with Dr Alcario Drought - will admit.   CRITICAL CARE RE: copd exacerbation requiring multiple neb txs, iv steroid therapy. Acute febrile illness with acute resp failure.  Performed by: Mirna Mires Total critical care time: 40 minutes Critical care time was exclusive of separately billable procedures and treating other patients. Critical care was necessary to treat or prevent imminent or life-threatening deterioration. Critical care was time spent personally by me on the following activities: development of treatment plan with patient and/or surrogate as well as nursing, discussions with consultants, evaluation of patient's response to treatment, examination of patient, obtaining history from patient or surrogate, ordering and performing treatments and interventions, ordering and review of laboratory studies, ordering and review of radiographic studies, pulse oximetry and re-evaluation of patient's condition.           Final Clinical Impression(s) / ED Diagnoses Final diagnoses:  COPD exacerbation ([place])  Acute febrile  illness    Rx / DC Orders ED Discharge Orders     None           Lajean Saver, MD 09/02/22 2341

## 2022-09-02 NOTE — ED Triage Notes (Signed)
Pt called EMS with sudden onset of SOB. Pt reports she took her inhaler. Pt is very anxious

## 2022-09-03 DIAGNOSIS — Z7982 Long term (current) use of aspirin: Secondary | ICD-10-CM | POA: Diagnosis not present

## 2022-09-03 DIAGNOSIS — N179 Acute kidney failure, unspecified: Secondary | ICD-10-CM | POA: Diagnosis present

## 2022-09-03 DIAGNOSIS — J441 Chronic obstructive pulmonary disease with (acute) exacerbation: Secondary | ICD-10-CM | POA: Diagnosis not present

## 2022-09-03 DIAGNOSIS — J159 Unspecified bacterial pneumonia: Secondary | ICD-10-CM | POA: Diagnosis present

## 2022-09-03 DIAGNOSIS — R6520 Severe sepsis without septic shock: Secondary | ICD-10-CM | POA: Diagnosis present

## 2022-09-03 DIAGNOSIS — F419 Anxiety disorder, unspecified: Secondary | ICD-10-CM | POA: Diagnosis present

## 2022-09-03 DIAGNOSIS — E890 Postprocedural hypothyroidism: Secondary | ICD-10-CM

## 2022-09-03 DIAGNOSIS — I2510 Atherosclerotic heart disease of native coronary artery without angina pectoris: Secondary | ICD-10-CM | POA: Diagnosis present

## 2022-09-03 DIAGNOSIS — Z1152 Encounter for screening for COVID-19: Secondary | ICD-10-CM | POA: Diagnosis not present

## 2022-09-03 DIAGNOSIS — F32A Depression, unspecified: Secondary | ICD-10-CM | POA: Diagnosis present

## 2022-09-03 DIAGNOSIS — Z79899 Other long term (current) drug therapy: Secondary | ICD-10-CM | POA: Diagnosis not present

## 2022-09-03 DIAGNOSIS — I5032 Chronic diastolic (congestive) heart failure: Secondary | ICD-10-CM | POA: Diagnosis not present

## 2022-09-03 DIAGNOSIS — M1990 Unspecified osteoarthritis, unspecified site: Secondary | ICD-10-CM | POA: Diagnosis present

## 2022-09-03 DIAGNOSIS — J440 Chronic obstructive pulmonary disease with acute lower respiratory infection: Secondary | ICD-10-CM | POA: Diagnosis present

## 2022-09-03 DIAGNOSIS — I10 Essential (primary) hypertension: Secondary | ICD-10-CM | POA: Diagnosis not present

## 2022-09-03 DIAGNOSIS — A419 Sepsis, unspecified organism: Secondary | ICD-10-CM | POA: Diagnosis present

## 2022-09-03 DIAGNOSIS — F17210 Nicotine dependence, cigarettes, uncomplicated: Secondary | ICD-10-CM | POA: Diagnosis present

## 2022-09-03 DIAGNOSIS — E872 Acidosis, unspecified: Secondary | ICD-10-CM | POA: Diagnosis present

## 2022-09-03 DIAGNOSIS — I110 Hypertensive heart disease with heart failure: Secondary | ICD-10-CM | POA: Diagnosis present

## 2022-09-03 DIAGNOSIS — Z955 Presence of coronary angioplasty implant and graft: Secondary | ICD-10-CM | POA: Diagnosis not present

## 2022-09-03 DIAGNOSIS — B9729 Other coronavirus as the cause of diseases classified elsewhere: Secondary | ICD-10-CM | POA: Diagnosis present

## 2022-09-03 DIAGNOSIS — Z8673 Personal history of transient ischemic attack (TIA), and cerebral infarction without residual deficits: Secondary | ICD-10-CM | POA: Diagnosis not present

## 2022-09-03 DIAGNOSIS — E7800 Pure hypercholesterolemia, unspecified: Secondary | ICD-10-CM | POA: Diagnosis present

## 2022-09-03 DIAGNOSIS — I252 Old myocardial infarction: Secondary | ICD-10-CM | POA: Diagnosis not present

## 2022-09-03 DIAGNOSIS — Z79890 Hormone replacement therapy (postmenopausal): Secondary | ICD-10-CM | POA: Diagnosis not present

## 2022-09-03 LAB — GLUCOSE, CAPILLARY
Glucose-Capillary: 224 mg/dL — ABNORMAL HIGH (ref 70–99)
Glucose-Capillary: 194 mg/dL — ABNORMAL HIGH (ref 70–99)
Glucose-Capillary: 166 mg/dL — ABNORMAL HIGH (ref 70–99)

## 2022-09-03 LAB — COMPREHENSIVE METABOLIC PANEL
ALT: 23 U/L (ref 0–44)
AST: 42 U/L — ABNORMAL HIGH (ref 15–41)
Albumin: 3.1 g/dL — ABNORMAL LOW (ref 3.5–5.0)
Alkaline Phosphatase: 52 U/L (ref 38–126)
Anion gap: 17 — ABNORMAL HIGH (ref 5–15)
BUN: 10 mg/dL (ref 8–23)
CO2: 18 mmol/L — ABNORMAL LOW (ref 22–32)
Calcium: 8.8 mg/dL — ABNORMAL LOW (ref 8.9–10.3)
Chloride: 103 mmol/L (ref 98–111)
Creatinine, Ser: 1.19 mg/dL — ABNORMAL HIGH (ref 0.44–1.00)
GFR, Estimated: 52 mL/min — ABNORMAL LOW (ref >60)
Glucose, Bld: 237 mg/dL — ABNORMAL HIGH (ref 70–99)
Potassium: 3.1 mmol/L — ABNORMAL LOW (ref 3.5–5.1)
Sodium: 138 mmol/L (ref 135–145)
Total Bilirubin: 0.2 mg/dL — ABNORMAL LOW (ref 0.3–1.2)
Total Protein: 6.1 g/dL — ABNORMAL LOW (ref 6.5–8.1)

## 2022-09-03 LAB — RESPIRATORY PANEL BY PCR

## 2022-09-03 LAB — MRSA NEXT GEN BY PCR, NASAL: MRSA by PCR Next Gen: NOT DETECTED

## 2022-09-03 LAB — CBC
HCT: 37.4 % (ref 36.0–46.0)
HCT: 35.5 % — ABNORMAL LOW (ref 36.0–46.0)
Hemoglobin: 12.8 g/dL (ref 12.0–15.0)
Hemoglobin: 11.8 g/dL — ABNORMAL LOW (ref 12.0–15.0)
MCH: 30 pg (ref 26.0–34.0)
MCH: 29.3 pg (ref 26.0–34.0)
MCHC: 34.2 g/dL (ref 30.0–36.0)
MCHC: 33.2 g/dL (ref 30.0–36.0)
MCV: 87.6 fL (ref 80.0–100.0)
MCV: 88.1 fL (ref 80.0–100.0)
Platelets: 207 10*3/uL (ref 150–400)
Platelets: 196 10*3/uL (ref 150–400)
RBC: 4.27 MIL/uL (ref 3.87–5.11)
RBC: 4.03 MIL/uL (ref 3.87–5.11)
RDW: 14.4 % (ref 11.5–15.5)
RDW: 14.4 % (ref 11.5–15.5)
WBC: 11.3 10*3/uL — ABNORMAL HIGH (ref 4.0–10.5)
WBC: 10 10*3/uL (ref 4.0–10.5)
nRBC: 0 % (ref 0.0–0.2)
nRBC: 0 % (ref 0.0–0.2)

## 2022-09-03 LAB — LACTIC ACID, PLASMA
Lactic Acid, Venous: 3.9 mmol/L (ref 0.5–1.9)
Lactic Acid, Venous: 4.7 mmol/L (ref 0.5–1.9)
Lactic Acid, Venous: 5.9 mmol/L (ref 0.5–1.9)
Lactic Acid, Venous: 7.4 mmol/L (ref 0.5–1.9)

## 2022-09-03 LAB — BASIC METABOLIC PANEL
Anion gap: 11 (ref 5–15)
BUN: 10 mg/dL (ref 8–23)
CO2: 20 mmol/L — ABNORMAL LOW (ref 22–32)
Calcium: 9.1 mg/dL (ref 8.9–10.3)
Chloride: 106 mmol/L (ref 98–111)
Creatinine, Ser: 0.93 mg/dL (ref 0.44–1.00)
GFR, Estimated: >60 mL/min (ref >60)
Glucose, Bld: 276 mg/dL — ABNORMAL HIGH (ref 70–99)
Potassium: 3.6 mmol/L (ref 3.5–5.1)
Sodium: 137 mmol/L (ref 135–145)

## 2022-09-03 LAB — PROTIME-INR
INR: 1.1 (ref 0.8–1.2)
Prothrombin Time: 14.1 seconds (ref 11.4–15.2)

## 2022-09-03 LAB — HIV ANTIBODY (ROUTINE TESTING W REFLEX): HIV Screen 4th Generation wRfx: NONREACTIVE

## 2022-09-03 LAB — T4, FREE: Free T4: 1.17 ng/dL — ABNORMAL HIGH (ref 0.61–1.12)

## 2022-09-03 LAB — HEMOGLOBIN A1C
Hgb A1c MFr Bld: 6.1 % — ABNORMAL HIGH (ref 4.8–5.6)
Mean Plasma Glucose: 128.37 mg/dL

## 2022-09-03 LAB — PROCALCITONIN: Procalcitonin: 0.94 ng/mL

## 2022-09-03 MED ORDER — MOMETASONE FURO-FORMOTEROL FUM 200-5 MCG/ACT IN AERO
2.0000 | INHALATION_SPRAY | Freq: Two times a day (BID) | RESPIRATORY_TRACT | Status: DC
  Administered 2022-09-03 (×2): 2 via RESPIRATORY_TRACT
  Filled 2022-09-03 (×2): qty 8.8

## 2022-09-03 MED ORDER — ADULT MULTIVITAMIN W/MINERALS CH
1.0000 | ORAL_TABLET | Freq: Every day | ORAL | Status: DC
  Administered 2022-09-03 – 2022-09-04 (×2): 1 via ORAL
  Filled 2022-09-03 (×2): qty 1

## 2022-09-03 MED ORDER — UMECLIDINIUM BROMIDE 62.5 MCG/ACT IN AEPB
1.0000 | INHALATION_SPRAY | Freq: Every day | RESPIRATORY_TRACT | Status: DC
  Administered 2022-09-03: 1 via RESPIRATORY_TRACT
  Filled 2022-09-03: qty 7

## 2022-09-03 MED ORDER — SODIUM CHLORIDE 0.9 % IV SOLN
500.0000 mg | INTRAVENOUS | Status: DC
  Administered 2022-09-03: 500 mg via INTRAVENOUS
  Filled 2022-09-03: qty 5

## 2022-09-03 MED ORDER — ACETAMINOPHEN 325 MG PO TABS: 650.0000 mg | ORAL_TABLET | Freq: Four times a day (QID) | ORAL | Status: DC | PRN

## 2022-09-03 MED ORDER — ONDANSETRON HCL 4 MG PO TABS: 4.0000 mg | ORAL_TABLET | Freq: Four times a day (QID) | ORAL | Status: DC | PRN

## 2022-09-03 MED ORDER — METHYLPREDNISOLONE SODIUM SUCC 40 MG IJ SOLR: 40.0000 mg | Freq: Every day | INTRAMUSCULAR | Status: DC

## 2022-09-03 MED ORDER — INSULIN ASPART 100 UNIT/ML IJ SOLN: 0.0000 [IU] | Freq: Every day | INTRAMUSCULAR | Status: DC

## 2022-09-03 MED ORDER — ROSUVASTATIN CALCIUM 20 MG PO TABS
20.0000 mg | ORAL_TABLET | Freq: Every day | ORAL | Status: DC
  Administered 2022-09-03 – 2022-09-04 (×2): 20 mg via ORAL
  Filled 2022-09-03 (×2): qty 1

## 2022-09-03 MED ORDER — ASPIRIN 81 MG PO TBEC
81.0000 mg | DELAYED_RELEASE_TABLET | Freq: Every day | ORAL | Status: DC
  Administered 2022-09-03 – 2022-09-04 (×2): 81 mg via ORAL
  Filled 2022-09-03 (×2): qty 1

## 2022-09-03 MED ORDER — SODIUM CHLORIDE 0.9 % IV SOLN
2.0000 g | INTRAVENOUS | Status: DC
  Administered 2022-09-03: 2 g via INTRAVENOUS
  Filled 2022-09-03: qty 20

## 2022-09-03 MED ORDER — LACTATED RINGERS IV SOLN: INTRAVENOUS | Status: DC

## 2022-09-03 MED ORDER — METHYLPREDNISOLONE SODIUM SUCC 40 MG IJ SOLR
40.0000 mg | Freq: Every day | INTRAMUSCULAR | Status: DC
  Administered 2022-09-03 – 2022-09-04 (×2): 40 mg via INTRAVENOUS
  Filled 2022-09-03 (×2): qty 1

## 2022-09-03 MED ORDER — INSULIN ASPART 100 UNIT/ML IJ SOLN
0.0000 [IU] | Freq: Three times a day (TID) | INTRAMUSCULAR | Status: DC
  Administered 2022-09-03: 3 [IU] via SUBCUTANEOUS
  Administered 2022-09-03: 5 [IU] via SUBCUTANEOUS
  Administered 2022-09-04: 3 [IU] via SUBCUTANEOUS

## 2022-09-03 MED ORDER — ALBUTEROL SULFATE (2.5 MG/3ML) 0.083% IN NEBU
2.5000 mg | INHALATION_SOLUTION | RESPIRATORY_TRACT | Status: DC | PRN
  Administered 2022-09-03 – 2022-09-04 (×2): 2.5 mg via RESPIRATORY_TRACT
  Filled 2022-09-03 (×2): qty 3

## 2022-09-03 MED ORDER — ALPRAZOLAM 0.5 MG PO TABS
1.0000 mg | ORAL_TABLET | Freq: Every day | ORAL | Status: DC | PRN
  Administered 2022-09-03: 1 mg via ORAL
  Filled 2022-09-03: qty 2

## 2022-09-03 MED ORDER — ACETAMINOPHEN 650 MG RE SUPP: 650.0000 mg | Freq: Four times a day (QID) | RECTAL | Status: DC | PRN

## 2022-09-03 MED ORDER — METHYLPREDNISOLONE SODIUM SUCC 125 MG IJ SOLR: 80.0000 mg | Freq: Every day | INTRAMUSCULAR | Status: DC

## 2022-09-03 MED ORDER — ENOXAPARIN SODIUM 40 MG/0.4ML IJ SOSY
40.0000 mg | PREFILLED_SYRINGE | INTRAMUSCULAR | Status: DC
  Administered 2022-09-03: 40 mg via SUBCUTANEOUS
  Filled 2022-09-03: qty 0.4

## 2022-09-03 MED ORDER — ONDANSETRON HCL 4 MG/2ML IJ SOLN: 4.0000 mg | Freq: Four times a day (QID) | INTRAMUSCULAR | Status: DC | PRN

## 2022-09-03 MED ORDER — LEVALBUTEROL HCL 0.63 MG/3ML IN NEBU: 0.6300 mg | INHALATION_SOLUTION | Freq: Four times a day (QID) | RESPIRATORY_TRACT | Status: DC | PRN

## 2022-09-03 MED ORDER — ALPRAZOLAM 0.5 MG PO TABS
1.0000 mg | ORAL_TABLET | Freq: Two times a day (BID) | ORAL | Status: DC | PRN
  Administered 2022-09-03 – 2022-09-04 (×2): 1 mg via ORAL
  Filled 2022-09-03 (×2): qty 2

## 2022-09-03 MED ORDER — PREDNISONE 20 MG PO TABS: 40.0000 mg | ORAL_TABLET | Freq: Every day | ORAL | Status: DC

## 2022-09-03 MED ORDER — POTASSIUM CHLORIDE CRYS ER 20 MEQ PO TBCR
40.0000 meq | EXTENDED_RELEASE_TABLET | Freq: Once | ORAL | Status: AC
  Administered 2022-09-03: 40 meq via ORAL
  Filled 2022-09-03: qty 2

## 2022-09-03 MED ORDER — ALBUTEROL SULFATE (2.5 MG/3ML) 0.083% IN NEBU
2.5000 mg | INHALATION_SOLUTION | Freq: Two times a day (BID) | RESPIRATORY_TRACT | Status: DC
  Administered 2022-09-03: 2.5 mg via RESPIRATORY_TRACT
  Filled 2022-09-03: qty 3

## 2022-09-03 NOTE — Assessment & Plan Note (Signed)
Pt with COPD exacerbation. Suspect pulmonary infection given co-presence of sepsis, lack of symptoms suggestive of any other source than lungs at this time. COPD pathway PNA ABx as above Scheduled LABA, LAMA, and INH steroid PRN SABA Prednisone (got solumedrol in ED) Adult wheeze protocol

## 2022-09-03 NOTE — Assessment & Plan Note (Signed)
Patient meets criteria for sepsis at time of admission. Specifically the patient has at least 2 out of 4 SIRS criteria, namely: Fever, tachycardia, tachypnea The currently suspected source of infection is Lung (pneumonia), based on symptoms. Lactic acid: 2.4 -> 3.9 (note that 3.9 was before any IVF given) Blood pressure: 100/50 IVF: 1L bolus and LR at 125 cc/hr Antibiotics: Rocephin and azithromycin Cultures pending COVID, FLU, RSV negative. WBC normal thus far.

## 2022-09-03 NOTE — Assessment & Plan Note (Signed)
Minimal BLE edema. CXR neg Running high grade fever of 103.0 and meeting sepsis criteria. Thus I dont think she's in acute exacerbation at the moment. Watch for volume overload with IVF Hold lasix

## 2022-09-03 NOTE — Progress Notes (Signed)
PT Cancellation Note  Patient Details Name: Jocelyn [name] MRN: [id number] DOB: 06/17/1961   Cancelled Treatment:    Reason Eval/Treat Not Completed: PT screened, no needs identified, will sign off (OT told PT that pt didnt need PT unless pt wanted to practice steps. Pt declined practicing steps as she feels comfortable with this. Sign off.)   Alvira Philips 09/03/2022, 3:40 PM Zakari Bathe M,PT [facility] [phone]

## 2022-09-03 NOTE — Assessment & Plan Note (Addendum)
H/o Hypothyroidism, looks like she's currently on synthroid. But chart also has h/o HYPERthyroidism in past too. Currently seems to be HYPERthyroid with slightly low TSH Will hold off on ordering home synthroid for the moment Check FT4 and T3 No AMS to suggest thyroid storm HYPERthyroidism could be alternative explanation for fever, but infection seems more likely given concurrent pulmonary symptoms. And usually when I see thyrotoxicosis to the degree to be causing 103 fever, TSH is undetectable.

## 2022-09-03 NOTE — H&P (Signed)
History and Physical    Patient: Jocelyn [name] WUX:[id number] DOB: Sep 07, 1960 DOA: 09/02/2022 DOS: the patient was seen and examined on 09/03/2022 PCP: Benito Mccreedy, MD  Patient coming from: Home  Chief Complaint:  Chief Complaint  Patient presents with   Shortness of Breath   HPI: Jocelyn [name] is a 62 y.o. female with medical history significant of COPD, dCHF, HTN.  Pt in to ED today with respiratory distress.  Began to have URI symptoms and productive cough yesterday.  Today this persisted, worsened, and was associated with development of severe SOB.  No leg swelling more than baseline.  Does have fever of 103 in ED.  Is still smoking.  Pt indicates she feels significantly better after multiple neb treatments in ED.  Pt denies ulcers, rashes, skin changes, abd pain, headache, stiff neck, or other concerns.  All symptoms seem to be respiratory in nature.  Cough is productive.   Review of Systems: As mentioned in the history of present illness. All other systems reviewed and are negative. Past Medical History:  Diagnosis Date   Anxiety    Arthritis    Chronic diastolic CHF (congestive heart failure), NYHA class 2 (HCC)    COPD (chronic obstructive pulmonary disease) (HCC)    Depression    HTN (hypertension)    Hypercholesterolemia    Hypothyroidism (acquired)    Kidney stones 2015   Myocardial infarction [facility])    x3- pt unsure if MIs were in 1996 or 1999, had stent fixed in 2016 but no MI at that time per pt   Stroke [facility]) 06/2021   Thyrotoxicosis    TIA (transient ischemic attack)    2013   Past Surgical History:  Procedure Laterality Date   BREAST CYST EXCISION     BUBBLE STUDY  06/10/2021   Procedure: BUBBLE STUDY;  Surgeon: Lelon Perla, MD;  Location: [place];  Service: Cardiovascular;;   CARDIAC CATHETERIZATION N/A 08/24/2015   Procedure: Left Heart Cath and Coronary Angiography;  Surgeon: Jettie Booze, MD;  Location: [place] CV  LAB;  Service: Cardiovascular;  Laterality: N/A;   COLONOSCOPY     CORONARY [place]   and then had procedure to repair in 2016   [place] N/A 06/10/2021   Procedure: LOOP RECORDER INSERTION;  Surgeon: Thompson Grayer, MD;  Location: [place] [facility] LAB;  Service: Cardiovascular;  Laterality: N/A;   partial hysterec     TEE WITHOUT CARDIOVERSION N/A 06/10/2021   Procedure: TRANSESOPHAGEAL ECHOCARDIOGRAM (TEE);  Surgeon: Lelon Perla, MD;  Location: [facility];  Service: Cardiovascular;  Laterality: N/A;   THYROIDECTOMY     Social History:  reports that she has been smoking cigarettes. She has been smoking an average of .5 packs per day. She has been exposed to tobacco smoke. She has never used smokeless tobacco. She reports that she does not drink alcohol and does not use drugs.  Allergies  Allergen Reactions   Hydrocodone Itching    Has problems with high doses    Family History  Problem Relation Age of Onset   Heart disease Mother    Stroke Mother    Hypertension Mother    Heart disease Father    Berenice Primas' disease Father    Colon cancer Father        pt unsure of age onset, believes he was in his 46s   Gout Brother    Gout Brother  Cancer Brother    Stroke Brother    Diabetes Maternal Grandmother    Hypertension Maternal Grandmother    Heart attack Neg Hx    Esophageal cancer Neg Hx    Stomach cancer Neg Hx    Rectal cancer Neg Hx     Prior to Admission medications   Medication Sig Start Date End Date Taking? Authorizing Provider  albuterol (PROVENTIL HFA;VENTOLIN HFA) 108 (90 Base) MCG/ACT inhaler Inhale 2 puffs into the lungs every 6 (six) hours as needed for wheezing or shortness of breath. 06/21/15 09/04/23 Yes [provider]  ALPRAZolam [name]) 1 MG tablet Take 1 mg by mouth daily. 05/10/20  Yes [provider]  aspirin EC 81 MG tablet Take 81 mg by mouth daily. Swallow whole.   Yes [provider]  clopidogrel (PLAVIX) 75 MG tablet Take 75 mg by mouth daily.   Yes [provider]  furosemide (LASIX) 40 MG tablet Take 40 mg by mouth daily.   Yes [provider]  gabapentin (NEURONTIN) 300 MG capsule Take 300 mg by mouth at bedtime as needed (pain). 10/05/21  Yes [provider]  ipratropium-albuterol (DUONEB) 0.5-2.5 (3) MG/3ML SOLN Take 3 mLs by nebulization every 6 (six) hours as needed. Patient taking differently: Take 3 mLs by nebulization every 6 (six) hours as needed (shortness of breath, wheezing). 05/21/21  Yes [name], [name], MD  isosorbide mononitrate (IMDUR) 30 MG 24 hr tablet Take 30 mg by mouth daily.   Yes [provider]  levothyroxine (SYNTHROID) 100 MCG tablet Take 100 mcg by mouth daily. 11/02/21  Yes [provider]  MELATONIN PO Take 10-20 mg by mouth at bedtime as needed (sleep).   Yes [provider]  Methylsulfonylmethane (MSM) 1000 MG TABS Take 1,000 mg by mouth daily.   Yes [provider]  metoprolol tartrate (LOPRESSOR) 25 MG tablet Take 25 mg by mouth daily. 10/05/21  Yes [provider]  promethazine-dextromethorphan (PROMETHAZINE-DM) 6.25-15 MG/5ML syrup Take 5 mLs by mouth every 6 (six) hours as needed for cough. 09/01/22  Yes [provider]  rosuvastatin (CRESTOR) 20 MG tablet Take 20 mg by mouth daily. 06/07/22  Yes [provider]  SYMBICORT 160-4.5 MCG/ACT inhaler Inhale 2 puffs into the lungs 2 (two) times daily. 08/08/22  Yes [provider]  tiZANidine (ZANAFLEX) 4 MG tablet Take 4 mg by mouth daily as needed for muscle spasms. 08/03/22  Yes [provider]  triamcinolone cream (KENALOG) 0.5 % Apply 1 application topically daily as needed (for sun exposure).   Yes [provider]  nitroGLYCERIN (NITROSTAT) 0.3 MG SL tablet Place 0.3 mg under the tongue every 5 (five) minutes as needed for chest pain. Patient not taking: Reported on  09/03/2022 10/28/15   [provider]    Physical Exam: Vitals:   09/03/22 0015 09/03/22 0100 09/03/22 0145 09/03/22 0159  BP: (!) 105/46 (!) 105/47 (!) 100/50   Pulse: (!) 126 (!) 123 (!) 114   Resp: (!) 28 (!) 31 19   Temp:    99.9 F (37.7 C)  TempSrc:    Oral  SpO2: 93% 91% 93%    Constitutional: NAD, calm, comfortable Neck: supple Respiratory: Wheezing present, but previously documented respiratory distress and accessory muscle use seems to have resolved at time of my exam. Cardiovascular: Regular rate and rhythm, no murmurs / rubs / gallops. No extremity edema. 2+ pedal pulses. No carotid bruits.  Abdomen: no tenderness, no masses palpated. No hepatosplenomegaly.  Bowel sounds positive.  Musculoskeletal: no clubbing / cyanosis. No joint deformity upper and lower extremities. Good ROM, no contractures. Normal muscle tone.  Skin: no rashes, lesions, ulcers. No induration Neurologic: CN 2-12 grossly intact. Sensation intact, DTR normal. Strength 5/5 in all 4.  Psychiatric: Normal judgment and insight. Alert and oriented x 3. Normal mood.   Data Reviewed:    CXR without acute findings  CBC    Component Value Date/Time   WBC 8.7 09/02/2022 2203   RBC 4.79 09/02/2022 2203   HGB 14.2 09/02/2022 2203   HCT 41.7 09/02/2022 2203   PLT 194 09/02/2022 2203   MCV 87.1 09/02/2022 2203   MCH 29.6 09/02/2022 2203   MCHC 34.1 09/02/2022 2203   RDW 14.2 09/02/2022 2203   LYMPHSABS 2.2 06/23/2022 0905   MONOABS 0.5 06/23/2022 0905   EOSABS 0.1 06/23/2022 0905   BASOSABS 0.0 06/23/2022 0905      Latest Ref Rng & Units 09/02/2022   10:03 PM 06/23/2022    9:05 AM 12/12/2021   11:43 PM  CMP  Glucose 70 - 99 mg/dL 135  99  114   BUN 8 - 23 mg/dL '10  11  8   '$ Creatinine 0.44 - 1.00 mg/dL 0.95  0.82  0.89   Sodium 135 - 145 mmol/L 139  140  139   Potassium 3.5 - 5.1 mmol/L 3.3  3.7  2.8   Chloride 98 - 111 mmol/L 102  106  104   CO2 22 - 32 mmol/L '23  24  27   '$ Calcium 8.9 -  10.3 mg/dL 9.1  9.5  9.4   Total Protein 6.5 - 8.1 g/dL 6.8  6.9  6.7   Total Bilirubin 0.3 - 1.2 mg/dL 0.6  0.5  0.7   Alkaline Phos 38 - 126 U/L 61  69  62   AST 15 - 41 U/L '26  18  19   '$ ALT 0 - 44 U/L '21  15  16    '$ COVID, FLU, RSV negative  Assessment and Plan: * Sepsis ([place]) Patient meets criteria for sepsis at time of admission. Specifically the patient has at least 2 out of 4 SIRS criteria, namely: Fever, tachycardia, tachypnea The currently suspected source of infection is Lung (pneumonia), based on symptoms. Lactic acid: 2.4 -> 3.9 (note that 3.9 was before any IVF given) Blood pressure: 100/50 IVF: 1L bolus and LR at 125 cc/hr Antibiotics: Rocephin and azithromycin Cultures pending COVID, FLU, RSV negative. WBC normal thus far.   COPD exacerbation (HCC) Pt with COPD exacerbation. Suspect pulmonary infection given co-presence of sepsis, lack of symptoms suggestive of any other source than lungs at this time. COPD pathway PNA ABx as above Scheduled LABA, LAMA, and INH steroid PRN SABA Prednisone (got solumedrol in ED) Adult wheeze protocol  Hypothyroidism H/o Hypothyroidism, looks like she's currently on synthroid. But chart also has h/o HYPERthyroidism in past too. Currently seems to be HYPERthyroid with slightly low TSH Will hold off on ordering home synthroid for the moment Check FT4 and T3 No AMS to suggest thyroid storm HYPERthyroidism could be alternative explanation for fever, but infection seems more likely given concurrent pulmonary symptoms. And usually when I see thyrotoxicosis to the degree to be causing 103 fever, TSH is undetectable.  Chronic diastolic CHF (congestive heart failure) (HCC) Minimal BLE edema. CXR neg Running high grade fever of 103.0 and meeting sepsis criteria. Thus I dont think she's in acute exacerbation at the moment. Watch for volume  overload with IVF Hold lasix  Hypertension Hold home BP meds in setting of sepsis.       Advance Care Planning:   Code Status: Full Code  Consults: None  Family Communication: Family at bedside  Severity of Illness: The appropriate patient status for this patient is INPATIENT. Inpatient status is judged to be reasonable and necessary in order to provide the required intensity of service to ensure the patient's safety. The patient's presenting symptoms, physical exam findings, and initial radiographic and laboratory data in the context of their chronic comorbidities is felt to place them at high risk for further clinical deterioration. Furthermore, it is not anticipated that the patient will be medically stable for discharge from the hospital within 2 midnights of admission.   * I certify that at the point of admission it is my clinical judgment that the patient will require inpatient hospital care spanning beyond 2 midnights from the point of admission due to high intensity of service, high risk for further deterioration and high frequency of surveillance required.*  Author: Etta Quill., DO 09/03/2022 2:08 AM  For on call review [email or web address].

## 2022-09-03 NOTE — Assessment & Plan Note (Signed)
Hold home BP meds in setting of sepsis.

## 2022-09-03 NOTE — Progress Notes (Signed)
Initial Nutrition Assessment  DOCUMENTATION CODES:   Not applicable  INTERVENTION:   - Liberalize diet to Regular to promote PO intake and provide the most options  - MVI with minerals daily  NUTRITION DIAGNOSIS:   Increased nutrient needs related to chronic illness (COPD, CHF) as evidenced by estimated needs.  GOAL:   Patient will meet greater than or equal to 90% of their needs  MONITOR:   PO intake, Labs, Weight trends  REASON FOR ASSESSMENT:   Consult COPD Protocol  ASSESSMENT:   62 year old female who presented to the ED on 2/02 with SOB. PMH of COPD, CHF, HTN, CVA, HLD, CAD, anxiety, depression, tobacco use. Pt admitted with COPD exacerbation and severe sepsis.  RD working remotely.  Spoke with pt via phone call to room. Pt reports low appetite PTA but states that she currently has an excellent appetite due to being on steroids and is hungry for lunch. Pt reports that PTA, some days she goes the whole day without eating while other days she eats regular portions and regular meals. Pt states that she likes to eat healthy foods and consumes apple slices and grapes for snacks.  Pt reports intentional weight loss. She states that she now weighs 209-210 lbs. Reviewed weight history in chart. Pt with a 3.8 kg (3.8%) weight loss since 02/23/22 which she reports is intentional.  RD to liberalize diet to Regular to provide pt with the most meal options. Will add daily MVI with minerals. Pt declines oral nutrition supplements.   Medications reviewed and include: SSI, IV solu-medrol, klor-con 40 mEq x 1, IV abx IVF: LR @ 125 ml/hr  Labs reviewed: lactic acid 4.7 (trending down), hemoglobin A1C 6.1  NUTRITION - FOCUSED PHYSICAL EXAM:  Unable to complete at this time. RD working remotely.  Diet Order:   Diet Order             Diet regular Room service appropriate? Yes; Fluid consistency: Thin  Diet effective now                   EDUCATION NEEDS:   Education  needs have been addressed  Skin:  Skin Assessment: Reviewed RN Assessment  Last BM:  no documented BM  Height:   Ht Readings from Last 1 Encounters:  09/03/22 '5\' 4"'$  (1.626 m)    Weight:   Wt Readings from Last 1 Encounters:  09/03/22 96.4 kg    Ideal Body Weight:  54.5 kg  BMI:  Body mass index is 36.48 kg/m.  Estimated Nutritional Needs:   Kcal:  1700-1900  Protein:  85-100 grams  Fluid:  1.7-1.9 L    Gustavus Bryant, MS, RD, LDN Inpatient Clinical Dietitian Please see AMiON for contact information.

## 2022-09-03 NOTE — Progress Notes (Signed)
[facility] PROGRESS NOTE  Jocelyn BLAZEJEWSKI (DOB: 09/15/1960) [id number] PCP: Benito Mccreedy, MD  Brief Narrative: Jocelyn [name] is a 62 y.o. 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 She reported 3 days of feeling like she had a cold for which she saw her PCP, but experienced shortness of breath gradually worsening over the course of 2/2 not significantly improved with home neb Tx. EMS found her to be hypoxic, gave nebs and steroids. CXR was clear, though she remained dyspneic with wheezes despite treatment. Admission requested. Lactic acid has risen since admission, BP soft, and she developed fever to 103F.    Subjective: Talking in full sentences on the phone with sister, reports significant improvement in breathing since admission. Does not feel chest pain, leg swelling, orthopnea currently. Felt very bad when febrile. Having cramping in her feet and hands usually accompanied by low potassium.   Objective: BP 100/64 (BP Location: Right Arm)   Pulse (!) 102   Temp 98.3 F (36.8 C) (Oral)   Resp (!) 21   Ht '5\' 4"'$  (1.626 m)   Wt 96.4 kg   SpO2 94%   BMI 36.48 kg/m   Gen: No distress Pulm: Fair air movement with expiratory wheezes predominantly at R lower lung zone. Nonlabored, but tachypneic  CV: Regular mild tachycardia without MRG, trace nonpitting edema at ankles GI: Soft, NT, ND, +BS  Neuro: Alert and oriented. No new focal deficits. Ext: Warm, no deformities. Skin: No rashes, lesions or ulcers on visualized skin   Assessment & Plan: [place] 61yo F presented with respiratory distress improved with steroids, nebs, and antibiotics. Developed fever since arrival. Unclear precipitating etiology currently, but she looks sicker on paper than she does at the bedside.  In fact, looks quite well, on room air, though lactic acid quite elevated and BP remains soft.   Acute respiratory failure and severe sepsis with lactic acidosis: AECOPD +/- pneumonia. CXR initially unremarkable and lung exam reassuring though still some wheezes. No other infectious signs or symptoms.  - Continue empiric CAP abx. MRSA PCR negative. PCT elevated to 0.94.  - Continue controlled meds, schedule nebs and continue prn nebs.  - Continue solumedrol while still wheezing.  - Remains appropriate for PCU.  - Check full respiratory panel to assist diagnostically - Monitor lactic acid - Monitor AKI, lactic acidosis with BMP - Supp K and monitor this (having associated cramps) - Monitor blood cultures  Chronic HFrEF, HTN, hx LV thrombus: Last echo was TEE in setting of stroke showing LVEF 40-45% with lateral akinesis. Reports no recent CHF symptoms, chronic very mild LE edema more consistent with venous insufficiency. BNP 67.8.  - Continue IVF since we're worried about severe sepsis with ongoing soft BP and not currently overloaded, but will be judicious going forward. Strict I/O - Hold lasix - VTE ppx - Hold imdur, metoprolol  Aortic plaque on TEE, hx CAD: No anginal complaints, troponin normal at 15.  - Hold BB - Continue home aspirin (last neurology note recommends stopping plavix) - Continue statin  Hx CVA: s/p ILR.  - Continue aspirin, statin (AST noted to be 42 w/41 ULN)  Hypothyroidism: TSH low, T4 elevated consistent with supratherapeutic synthroid.  - Hold synthroid now and plan to restart at 54mg (was 1016m) dose with PCP and/or endocrinology follow up.  Anxiety/depression:  - Continue home xanax as prn  Patrecia Pour, MD [facility] [email or web address] 09/03/2022, 8:49 AM

## 2022-09-03 NOTE — Evaluation (Addendum)
Occupational Therapy Evaluation/Discharge Patient Details Name: Jocelyn [name] MRN: [id number] DOB: [birth year] Today's Date: 09/03/2022   History of Present Illness Pt is a 62 y/o female admitted for acute respiratory failure and severe sepsis with lactic acidosis.  PMHx: anxiety, arthritis, CHF, HTN, MI, TIA, COPD   Clinical Impression   PTA, pt lives alone, typically Independent with ADLs, IADLs and mobility though does has daily aide assist for 2 hours as needed for IADLs. Pt presents now at baseline for ADLs/mobility with BP improving during activity. Discussed energy conservation strategies and provided handout for pt reference. Recommend mobility specialist follow pt acutely to maximize endurance though no further skilled OT services needed at this time. Please reconsult if needs change.  BP pre activity: 99/57 BP post activity: 109/62  O2 WFL on RA, denies SOB     Recommendations for follow up therapy are one component of a multi-disciplinary discharge planning process, led by the attending physician.  Recommendations may be updated based on patient status, additional functional criteria and insurance authorization.   Follow Up Recommendations  No OT follow up     Assistance Recommended at Discharge PRN  Patient can return home with the following Assistance with cooking/housework    Functional Status Assessment  Patient has not had a recent decline in their functional status  Equipment Recommendations  None recommended by OT    Recommendations for Other Services       Precautions / Restrictions Precautions Precautions: Other (comment) Precaution Comments: monitor BP (soft) Restrictions Weight Bearing Restrictions: No      Mobility Bed Mobility Overal bed mobility: Modified Independent                  Transfers Overall transfer level: Independent Equipment used: None                      Balance Overall balance assessment: Independent                                          ADL either performed or assessed with clinical judgement   ADL Overall ADL's : Independent                                       General ADL Comments: assist only for lines (IV pole), able to mobilize in room, complete ADLs standing at sink, mobilize to bathroom and manage toileting task without assist. Exited bathroom without assist. Discussed energy conservation w/ pt already implementing these strategies and always "takes her time". Politely declined out of room mobility due to avoiding germs     Vision Ability to See in Adequate Light: 0 Adequate Patient Visual Report: No change from baseline Vision Assessment?: No apparent visual deficits     Perception     Praxis      Pertinent Vitals/Pain Pain Assessment Pain Assessment: No/denies pain     Hand Dominance Right   Extremity/Trunk Assessment Upper Extremity Assessment Upper Extremity Assessment: Overall WFL for tasks assessed   Lower Extremity Assessment Lower Extremity Assessment: Overall WFL for tasks assessed   Cervical / Trunk Assessment Cervical / Trunk Assessment: Normal   Communication Communication Communication: No difficulties   Cognition Arousal/Alertness: Awake/alert Behavior During Therapy: WFL for tasks assessed/performed Overall Cognitive Status: Within Functional Limits for tasks  assessed                                       General Comments       Exercises     Shoulder Instructions      Home Living Family/patient expects to be discharged to:: Private residence Living Arrangements: Alone Available Help at Discharge: Family;Friend(s);Personal care attendant;Available PRN/intermittently Type of Home: Apartment Home Access: Stairs to enter Entrance Stairs-Number of Steps: 3   Home Layout: Two level;Bed/bath upstairs     Bathroom Shower/Tub: Teacher, early years/pre: Standard     Home  Equipment: Cane - single point;Shower seat   Additional Comments: has an aide daily for 2 hours      Prior Functioning/Environment Prior Level of Function : Independent/Modified Independent             Mobility Comments: no AD ADLs Comments: able to manage ADLs and IADLs without assist though when aide comes, she will assist with heavier IADLs as needed        OT Problem List:        OT Treatment/Interventions:      OT Goals(Current goals can be found in the care plan section) Acute Rehab OT Goals Patient Stated Goal: get a wash up and have some lunch OT Goal Formulation: All assessment and education complete, DC therapy  OT Frequency:      Co-evaluation              AM-PAC OT "6 Clicks" Daily Activity     Outcome Measure Help from another person eating meals?: None Help from another person taking care of personal grooming?: None Help from another person toileting, which includes using toliet, bedpan, or urinal?: None Help from another person bathing (including washing, rinsing, drying)?: None Help from another person to put on and taking off regular upper body clothing?: None Help from another person to put on and taking off regular lower body clothing?: None 6 Click Score: 24   End of Session Nurse Communication: Mobility status  Activity Tolerance: Patient tolerated treatment well Patient left: in bed;with call bell/phone within reach;Other (comment) (with RT)  OT Visit Diagnosis: Other (comment) (decreased cardiopulmonary endurance)                Time: 1916-6060 OT Time Calculation (min): 27 min Charges:  OT General Charges $OT Visit: 1 Visit OT Evaluation $OT Eval Low Complexity: 1 Low OT Treatments $Self Care/Home Management : 8-22 mins  Malachy Chamber, OTR/L Acute Rehab Services Office: [phone]   Layla Maw 09/03/2022, 11:11 AM

## 2022-09-03 NOTE — ED Notes (Signed)
ED TO INPATIENT HANDOFF REPORT  ED Nurse Name and Phone #: Delories Heinz  S Name/Age/Gender Jocelyn [name] 62 y.o. female Room/Bed: 022C/022C  Code Status   Code Status: Full Code  Home/SNF/Other Home Patient oriented to: self, place, time, and situation Is this baseline? Yes   Triage Complete: Triage complete  Chief Complaint Sepsis [facility]) [A41.9]  Triage Note Pt called EMS with sudden onset of SOB. Pt reports she took her inhaler. Pt is very anxious    Allergies Allergies  Allergen Reactions   Hydrocodone Itching    Has problems with high doses    Level of Care/Admitting Diagnosis ED Disposition     ED Disposition  Admit   Condition  --   [place]: [place] [[id number]]  Level of Care: Progressive [102]  Admit to Progressive based on following criteria: MULTISYSTEM THREATS such as stable sepsis, metabolic/electrolyte imbalance with or without encephalopathy that is responding to early treatment.  Admit to Progressive based on following criteria: RESPIRATORY PROBLEMS hypoxemic/hypercapnic respiratory failure that is responsive to NIPPV (BiPAP) or High Flow Nasal Cannula (6-80 lpm). Frequent assessment/intervention, no > Q2 hrs < Q4 hrs, to maintain oxygenation and pulmonary hygiene.  May admit patient to [facility] or [name] if equivalent level of care is available:: No  Covid Evaluation: Asymptomatic - no recent exposure (last 10 days) testing not required  Diagnosis: Sepsis [facility]) [[id number]]  Admitting Physician: Etta Quill [[id number]]  Attending Physician: Etta Quill [[id number]]  Certification:: I certify this patient will need inpatient services for at least 2 midnights  Estimated Length of Stay: 3          B Medical/Surgery History Past Medical History:  Diagnosis Date   Anxiety    Arthritis    Chronic diastolic CHF (congestive heart failure), NYHA class 2 (HCC)    COPD (chronic obstructive pulmonary disease) ([place])     Depression    HTN (hypertension)    Hypercholesterolemia    Hypothyroidism (acquired)    Kidney stones 2015   Myocardial infarction ([place])    x3- pt unsure if MIs were in 1996 or 1999, had stent fixed in 2016 but no MI at that time per pt   Stroke [facility]) 06/2021   Thyrotoxicosis    TIA (transient ischemic attack)    2013   Past Surgical History:  Procedure Laterality Date   BREAST CYST EXCISION     BUBBLE STUDY  06/10/2021   Procedure: BUBBLE STUDY;  Surgeon: Lelon Perla, MD;  Location: [place];  Service: Cardiovascular;;   CARDIAC CATHETERIZATION N/A 08/24/2015   Procedure: Left Heart Cath and Coronary Angiography;  Surgeon: Jettie Booze, MD;  Location: [place] CV LAB;  Service: Cardiovascular;  Laterality: N/A;   COLONOSCOPY     CORONARY [place]   and then had procedure to repair in 2016   [place] N/A 06/10/2021   Procedure: LOOP RECORDER INSERTION;  Surgeon: Thompson Grayer, MD;  Location: [place] CV LAB;  Service: Cardiovascular;  Laterality: N/A;   partial hysterec     TEE WITHOUT CARDIOVERSION N/A 06/10/2021   Procedure: TRANSESOPHAGEAL ECHOCARDIOGRAM (TEE);  Surgeon: Lelon Perla, MD;  Location: [facility] ENDOSCOPY;  Service: Cardiovascular;  Laterality: N/A;   THYROIDECTOMY       A IV Location/Drains/Wounds Patient Lines/Drains/Airways Status     Active Line/Drains/Airways     Name Placement date Placement time Site Days  Peripheral IV 06/23/22 20 G Right Antecubital 06/23/22  1030  Antecubital  72   Peripheral IV 09/02/22 20 G Distal;Right Forearm 09/02/22  2330  Forearm  1            Intake/Output Last 24 hours  Intake/Output Summary (Last 24 hours) at 09/03/2022 0412 Last data filed at 09/03/2022 0029 Gross per 24 hour  Intake 1350.4 ml  Output --  Net 1350.4 ml    Labs/Imaging Results for orders placed or performed during the hospital encounter of 09/02/22 (from the past 48  hour(s))  CBC     Status: None   Collection Time: 09/02/22 10:03 PM  Result Value Ref Range   WBC 8.7 4.0 - 10.5 K/uL   RBC 4.79 3.87 - 5.11 MIL/uL   Hemoglobin 14.2 12.0 - 15.0 g/dL   HCT 41.7 36.0 - 46.0 %   MCV 87.1 80.0 - 100.0 fL   MCH 29.6 26.0 - 34.0 pg   MCHC 34.1 30.0 - 36.0 g/dL   RDW 14.2 11.5 - 15.5 %   Platelets 194 150 - 400 K/uL   nRBC 0.0 0.0 - 0.2 %    Comment: Performed at [facility], [place] [address], [place], [place] [address]  Comprehensive metabolic panel     Status: Abnormal   Collection Time: 09/02/22 10:03 PM  Result Value Ref Range   Sodium 139 135 - 145 mmol/L   Potassium 3.3 (L) 3.5 - 5.1 mmol/L   Chloride 102 98 - 111 mmol/L   CO2 23 22 - 32 mmol/L   Glucose, Bld 135 (H) 70 - 99 mg/dL    Comment: Glucose reference range applies only to samples taken after fasting for at least 8 hours.   BUN 10 8 - 23 mg/dL   Creatinine, Ser 0.95 0.44 - 1.00 mg/dL   Calcium 9.1 8.9 - 10.3 mg/dL   Total Protein 6.8 6.5 - 8.1 g/dL   Albumin 3.5 3.5 - 5.0 g/dL   AST 26 15 - 41 U/L   ALT 21 0 - 44 U/L   Alkaline Phosphatase 61 38 - 126 U/L   Total Bilirubin 0.6 0.3 - 1.2 mg/dL   GFR, Estimated >60 >60 mL/min    Comment: (NOTE) Calculated using the CKD-EPI Creatinine Equation (2021)    Anion gap 14 5 - 15    Comment: Performed at [facility] [address], [place], [place] [address]  Brain natriuretic peptide     Status: None   Collection Time: 09/02/22 10:03 PM  Result Value Ref Range   B Natriuretic Peptide 67.8 0.0 - 100.0 pg/mL    Comment: Performed at [facility] [address] [place], [place] [address]  Troponin I (High Sensitivity)     Status: None   Collection Time: 09/02/22 10:03 PM  Result Value Ref Range   Troponin I (High Sensitivity) 15 <18 ng/L    Comment: (NOTE) Elevated high sensitivity troponin I (hsTnI) values and significant  changes across serial measurements may suggest ACS but many other  chronic and acute  conditions are known to elevate hsTnI results.  Refer to the "Links" section for chest pain algorithms and additional  guidance. Performed at [facility], [place] [address], [place], [place] [address]   Lactic acid, plasma     Status: Abnormal   Collection Time: 09/02/22 10:03 PM  Result Value Ref Range   Lactic Acid, Venous 2.5 (HH) 0.5 - 1.9 mmol/L  Comment: CRITICAL RESULT CALLED TO, READ BACK BY AND VERIFIED WITH Margene Cherian RN 09/02/22 2308 Wiliam Ke Performed at [facility], [address] [place], [place] [address]   TSH     Status: Abnormal   Collection Time: 09/02/22 10:03 PM  Result Value Ref Range   TSH 0.247 (L) 0.350 - 4.500 uIU/mL    Comment: Performed by a 3rd Generation assay with a functional sensitivity of <=0.01 uIU/mL. Performed at [facility], [place] [address], [place], [place] [address]   Resp panel by RT-PCR (RSV, Flu A&B, Covid) Anterior Nasal Swab     Status: None   Collection Time: 09/02/22 10:06 PM   Specimen: Anterior Nasal Swab  Result Value Ref Range   SARS Coronavirus 2 by RT PCR NEGATIVE NEGATIVE   Influenza A by PCR NEGATIVE NEGATIVE   Influenza B by PCR NEGATIVE NEGATIVE    Comment: (NOTE) The Xpert Xpress SARS-CoV-2/FLU/RSV plus assay is intended as an aid in the diagnosis of influenza from Nasopharyngeal swab specimens and should not be used as a sole basis for treatment. Nasal washings and aspirates are unacceptable for Xpert Xpress SARS-CoV-2/FLU/RSV testing.  Fact Sheet for Patients: EntrepreneurPulse.com.au  Fact Sheet for Healthcare Providers: IncredibleEmployment.be  This test is not yet approved or cleared by the [place] FDA and has been authorized for detection and/or diagnosis of SARS-CoV-2 by FDA under an Emergency Use Authorization (EUA). This EUA will remain in effect (meaning this test can be used) for the duration of the COVID-19 declaration under Section  564(b)(1) of the Act, 21 U.S.C. section 360bbb-3(b)(1), unless the authorization is terminated or revoked.     Resp Syncytial Virus by PCR NEGATIVE NEGATIVE    Comment: (NOTE) Fact Sheet for Patients: EntrepreneurPulse.com.au  Fact Sheet for Healthcare Providers: IncredibleEmployment.be  This test is not yet approved or cleared by the [place] FDA and has been authorized for detection and/or diagnosis of SARS-CoV-2 by FDA under an Emergency Use Authorization (EUA). This EUA will remain in effect (meaning this test can be used) for the duration of the COVID-19 declaration under Section 564(b)(1) of the Act, 21 U.S.C. section 360bbb-3(b)(1), unless the authorization is terminated or revoked.  Performed at [facility], [place] [address], [place], [place] [address]   Lactic acid, plasma     Status: Abnormal   Collection Time: 09/03/22 12:25 AM  Result Value Ref Range   Lactic Acid, Venous 3.9 (HH) 0.5 - 1.9 mmol/L    Comment: CRITICAL VALUE NOTED. VALUE IS CONSISTENT WITH PREVIOUSLY REPORTED/CALLED VALUE Performed at [facility], [place] [address], [place], [place] [address]   Protime-INR     Status: None   Collection Time: 09/03/22  2:40 AM  Result Value Ref Range   Prothrombin Time 14.1 11.4 - 15.2 seconds   INR 1.1 0.8 - 1.2    Comment: (NOTE) INR goal varies based on device and disease states. Performed at [facility], [place] [address], [place], [place] [address]   Procalcitonin     Status: None   Collection Time: 09/03/22  2:40 AM  Result Value Ref Range   Procalcitonin 0.94 ng/mL    Comment:        Interpretation: PCT > 0.5 ng/mL and <= 2 ng/mL: Systemic infection (sepsis) is possible, but other conditions are known to elevate PCT as well. (NOTE)       Sepsis PCT Algorithm  Lower Respiratory Tract                                      Infection PCT Algorithm    ----------------------------      ----------------------------         PCT < 0.25 ng/mL                PCT < 0.10 ng/mL          Strongly encourage             Strongly discourage   discontinuation of antibiotics    initiation of antibiotics    ----------------------------     -----------------------------       PCT 0.25 - 0.50 ng/mL            PCT 0.10 - 0.25 ng/mL               OR       >80% decrease in PCT            Discourage initiation of                                            antibiotics      Encourage discontinuation           of antibiotics    ----------------------------     -----------------------------         PCT >= 0.50 ng/mL              PCT 0.26 - 0.50 ng/mL                AND       <80% decrease in PCT             Encourage initiation of                                             antibiotics       Encourage continuation           of antibiotics    ----------------------------     -----------------------------        PCT >= 0.50 ng/mL                  PCT > 0.50 ng/mL               AND         increase in PCT                  Strongly encourage                                      initiation of antibiotics    Strongly encourage escalation           of antibiotics                                     -----------------------------  PCT <= 0.25 ng/mL                                                 OR                                        > 80% decrease in PCT                                      Discontinue / Do not initiate                                             antibiotics  Performed at [facility], [place] [address] [place] [address] [place], [place] [address]   Comprehensive metabolic panel     Status: Abnormal (Preliminary result)   Collection Time: 09/03/22  2:40 AM  Result Value Ref Range   Sodium 138 135 - 145 mmol/L   Potassium 3.1 (L) 3.5 - 5.1 mmol/L   Chloride 103 98 - 111 mmol/L   CO2 18 (L) 22 - 32 mmol/L   Glucose, Bld 237 (H) 70 - 99 mg/dL     Comment: Glucose reference range applies only to samples taken after fasting for at least 8 hours.   BUN 10 8 - 23 mg/dL   Creatinine, Ser 1.19 (H) 0.44 - 1.00 mg/dL   Calcium 8.8 (L) 8.9 - 10.3 mg/dL   Total Protein 6.1 (L) 6.5 - 8.1 g/dL   Albumin 3.1 (L) 3.5 - 5.0 g/dL   AST 42 (H) 15 - 41 U/L   ALT PENDING 0 - 44 U/L   Alkaline Phosphatase 52 38 - 126 U/L   Total Bilirubin 0.2 (L) 0.3 - 1.2 mg/dL   GFR, Estimated 52 (L) >60 mL/min    Comment: (NOTE) Calculated using the CKD-EPI Creatinine Equation (2021)    Anion gap 17 (H) 5 - 15    Comment: Performed at [facility], [place] [address] [place], [place] [address]  Lactic acid, plasma     Status: Abnormal   Collection Time: 09/03/22  2:40 AM  Result Value Ref Range   Lactic Acid, Venous 7.4 (HH) 0.5 - 1.9 mmol/L    Comment: CRITICAL VALUE NOTED. VALUE IS CONSISTENT WITH PREVIOUSLY REPORTED/CALLED VALUE Performed at [facility], [place] [address] [place], [place] [address]   CBC     Status: Abnormal   Collection Time: 09/03/22  2:40 AM  Result Value Ref Range   WBC 11.3 (H) 4.0 - 10.5 K/uL   RBC 4.27 3.87 - 5.11 MIL/uL   Hemoglobin 12.8 12.0 - 15.0 g/dL   HCT 37.4 36.0 - 46.0 %   MCV 87.6 80.0 - 100.0 fL   MCH 30.0 26.0 - 34.0 pg   MCHC 34.2 30.0 - 36.0 g/dL   RDW 14.4 11.5 - 15.5 %   Platelets 207 150 - 400 K/uL   nRBC 0.0 0.0 - 0.2 %    Comment: Performed at [facility], [place] [address], [place], [place] [address]  DG Chest Port 1 View  Result Date: 09/02/2022 CLINICAL DATA:  Short of breath EXAM: PORTABLE CHEST 1 VIEW COMPARISON:  06/23/2022 FINDINGS: Single frontal view of the chest demonstrates a stable cardiac silhouette. No airspace disease, effusion, or pneumothorax. No acute displaced fracture. Stable loop recorder. IMPRESSION: 1. No acute intrathoracic process. Electronically Signed   By: Randa Ngo M.D.   On: 09/02/2022 22:23    Pending Labs Unresulted Labs (From admission, onward)      Start     Ordered   09/03/22 0213  T4, free  Once,   R        09/03/22 0212   09/03/22 0213  T3  Once,   R        09/03/22 0212   09/03/22 0139  Lactic acid, plasma  STAT Now then every 3 hours,   R (with STAT occurrences)      09/03/22 0140   09/03/22 0132  HIV Antibody (routine testing w rflx)  (HIV Antibody (Routine testing w reflex) panel)  Once,   R        09/03/22 0140   09/02/22 2206  Blood culture (routine x 2)  BLOOD CULTURE X 2,   R (with STAT occurrences)      09/02/22 2206            Vitals/Pain Today's Vitals   09/03/22 0230 09/03/22 0300 09/03/22 0358 09/03/22 0400  BP: (!) 99/50 (!) 90/58  (!) 107/56  Pulse: (!) 111 (!) 111  (!) 109  Resp: (!) 30 (!) 30  (!) 26  Temp:      TempSrc:      SpO2: 94% 95%  93%  PainSc:   0-No pain     Isolation Precautions No active isolations  Medications Medications  lactated ringers infusion ( Intravenous New Bag/Given 09/03/22 0245)  cefTRIAXone (ROCEPHIN) 2 g in sodium chloride 0.9 % 100 mL IVPB (has no administration in time range)  azithromycin (ZITHROMAX) 500 mg in sodium chloride 0.9 % 250 mL IVPB (has no administration in time range)  acetaminophen (TYLENOL) tablet 650 mg (has no administration in time range)    Or  acetaminophen (TYLENOL) suppository 650 mg (has no administration in time range)  ondansetron (ZOFRAN) tablet 4 mg (has no administration in time range)    Or  ondansetron (ZOFRAN) injection 4 mg (has no administration in time range)  enoxaparin (LOVENOX) injection 40 mg (has no administration in time range)  predniSONE (DELTASONE) tablet 40 mg (has no administration in time range)  mometasone-formoterol (DULERA) 200-5 MCG/ACT inhaler 2 puff (has no administration in time range)  levalbuterol (XOPENEX) nebulizer solution 0.63 mg (has no administration in time range)  umeclidinium bromide (INCRUSE ELLIPTA) 62.5 MCG/ACT 1 puff (has no administration in time range)  albuterol (PROVENTIL) (2.5 MG/3ML) 0.083%  nebulizer solution 5 mg (5 mg Nebulization Given 09/02/22 2214)  ipratropium (ATROVENT) nebulizer solution 0.5 mg (0.5 mg Nebulization Given 09/02/22 2213)  cefTRIAXone (ROCEPHIN) 2 g in sodium chloride 0.9 % 100 mL IVPB (0 g Intravenous Stopped 09/02/22 2337)  azithromycin (ZITHROMAX) 500 mg in sodium chloride 0.9 % 250 mL IVPB (0 mg Intravenous Stopped 09/02/22 2347)  acetaminophen (TYLENOL) tablet 1,000 mg (1,000 mg Oral Given 09/02/22 2255)  ibuprofen (ADVIL) tablet 400 mg (400 mg Oral Given 09/02/22 2255)  albuterol (PROVENTIL) (2.5 MG/3ML) 0.083% nebulizer solution 5 mg (5 mg Nebulization Given 09/02/22 2334)  ipratropium (ATROVENT) nebulizer solution 0.5 mg (0.5 mg Nebulization Given 09/02/22 2335)  lactated ringers  bolus 1,000 mL (0 mLs Intravenous Stopped 09/03/22 0029)  potassium chloride SA (KLOR-CON M) CR tablet 40 mEq (40 mEq Oral Given 09/02/22 2334)    Mobility walks     Focused Assessments Pulmonary Assessment Handoff:  Lung sounds:   O2 Device: Room Air      R Recommendations: See Admitting Provider Note  Report given to:   Additional Notes:  Pt is in no distress noted, the best pt now that she is feeling better

## 2022-09-04 DIAGNOSIS — A419 Sepsis, unspecified organism: Secondary | ICD-10-CM | POA: Diagnosis not present

## 2022-09-04 LAB — GLUCOSE, CAPILLARY: Glucose-Capillary: 151 mg/dL — ABNORMAL HIGH (ref 70–99)

## 2022-09-04 MED ORDER — CEFDINIR 300 MG PO CAPS: 300.0000 mg | ORAL_CAPSULE | Freq: Two times a day (BID) | ORAL | 0 refills | Status: AC

## 2022-09-04 MED ORDER — AZITHROMYCIN 250 MG PO TABS: 250.0000 mg | ORAL_TABLET | Freq: Every day | ORAL | 0 refills | Status: AC

## 2022-09-04 MED ORDER — ALBUTEROL SULFATE (2.5 MG/3ML) 0.083% IN NEBU: 2.5000 mg | INHALATION_SOLUTION | RESPIRATORY_TRACT | 0 refills | Status: DC | PRN

## 2022-09-04 MED ORDER — PREDNISONE 20 MG PO TABS: 40.0000 mg | ORAL_TABLET | Freq: Every day | ORAL | 0 refills | Status: AC

## 2022-09-04 MED ORDER — FLUCONAZOLE 150 MG PO TABS: ORAL_TABLET | ORAL | 0 refills | Status: DC

## 2022-09-04 NOTE — Discharge Summary (Signed)
Physician Discharge Summary   Patient: Jocelyn [name] MRN: [id number] DOB: Dec 02, 1960  Admit date:     09/02/2022  Discharge date: 09/04/22  Discharge Physician: Patrecia Pour   PCP: Benito Mccreedy, MD   Recommendations at discharge:  Follow up with PCP in 1-2 weeks.  Suggest recheck BMP, CBC, and thyroid function testing.   Discharge Diagnoses: Principal Problem:   Sepsis ([place]) Active Problems:   COPD exacerbation ([place])   Hypothyroidism   Hypertension   Chronic diastolic CHF (congestive heart failure) [facility])  Hospital Course: Jocelyn [name] is a 62 y.o. 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 She reported 3 days of feeling like she had a cold for which she saw her PCP, but experienced shortness of breath gradually worsening over the course of 2/2 not significantly improved with home neb Tx. EMS found her to be hypoxic, gave nebs and steroids. CXR was clear, though she remained dyspneic with wheezes despite treatment. Admission requested. Lactic acid has risen since admission, BP soft, and she developed fever to 103F.     Tested positive for coronavirus OC43. Respiratory and hemodynamic status stabilized over the course of admission with treatment for AECOPD and superimposed bacterial pneumonia. She has been on room air with stable vital signs for 24 hours, and is stable for discharge on 2/4.  Assessment and Plan: Pleasant 62yo F presented with respiratory distress improved with steroids, nebs, and antibiotics. Developed fever, found to have coronavirus OC43 on full viral panel. Treated with steroids, nebs, and antibiotics given suspicion for superimposed pneumonia. Her lactic acid elevation improved and she never appeared to be in shock.    Acute respiratory failure and  severe sepsis with lactic acidosis due to coronavirus infection causing AECOPD with possible superimposed bacterial pneumonia.  - Continue empiric CAP abx. MRSA PCR negative. PCT elevated to 0.94.  - Continue controller home med, plan scheduled albuterol at discharge. Supplies given for her home neb.  - Continue steroids.  - Blood cultures remain negative, defervesced, Respiratory status normalized prior to discharge.   Chronic HFrEF, HTN, hx LV thrombus: Last echo was TEE in setting of stroke showing LVEF 40-45% with lateral akinesis. Reports no recent CHF symptoms, chronic very mild LE edema more consistent with venous insufficiency. BNP 67.8.  - Continue home Tx  Aortic plaque on TEE, hx CAD: No anginal complaints, troponin normal at 15.  - Continue BB - Continue home aspirin (last neurology note recommends stopping plavix) - Continue statin   Hx CVA: s/p ILR.  - Continue aspirin, statin (AST noted to be 42 w/41 ULN)   Hypothyroidism: TSH low, T4 elevated consistent with supratherapeutic synthroid.  - Discussed decreasing dose of synthroid. Pt reports decrease from 162mg to 1037m > a year ago and felt much better before the change. Does not want to decrease further at this time. She is clinically stable. She will follow up with PCP and/or new endocrinology as her endo has retired.     Anxiety/depression:  - Continue home xanax as prn  Consultants: None Procedures performed: None  Disposition: Home Diet recommendation:  Cardiac diet DISCHARGE MEDICATION: Allergies as of 09/04/2022       Reactions   Hydrocodone Itching   Has problems with high doses        Medication List  TAKE these medications    albuterol 108 (90 Base) MCG/ACT inhaler Commonly known as: VENTOLIN HFA Inhale 2 puffs into the lungs every 6 (six) hours as needed for wheezing or shortness of breath. What changed: Another medication with the same name was added. Make sure you understand how and when to  take each.   albuterol (2.5 MG/3ML) 0.083% nebulizer solution Commonly known as: PROVENTIL Take 3 mLs (2.5 mg total) by nebulization every 4 (four) hours as needed for wheezing or shortness of breath. What changed: You were already taking a medication with the same name, and this prescription was added. Make sure you understand how and when to take each.   ALPRAZolam 1 MG tablet Commonly known as: XANAX Take 1 mg by mouth daily.   aspirin EC 81 MG tablet Take 81 mg by mouth daily. Swallow whole.   azithromycin 250 MG tablet Commonly known as: ZITHROMAX Take 1 tablet (250 mg total) by mouth daily for 3 days.   cefdinir 300 MG capsule Commonly known as: OMNICEF Take 1 capsule (300 mg total) by mouth 2 (two) times daily for 3 days.   clopidogrel 75 MG tablet Commonly known as: PLAVIX Take 75 mg by mouth daily.   furosemide 40 MG tablet Commonly known as: LASIX Take 40 mg by mouth daily.   gabapentin 300 MG capsule Commonly known as: NEURONTIN Take 300 mg by mouth at bedtime as needed (pain).   ipratropium-albuterol 0.5-2.5 (3) MG/3ML Soln Commonly known as: DUONEB Take 3 mLs by nebulization every 6 (six) hours as needed. What changed: reasons to take this   isosorbide mononitrate 30 MG 24 hr tablet Commonly known as: IMDUR Take 30 mg by mouth daily.   levothyroxine 100 MCG tablet Commonly known as: SYNTHROID Take 100 mcg by mouth daily.   MELATONIN PO Take 10-20 mg by mouth at bedtime as needed (sleep).   metoprolol tartrate 25 MG tablet Commonly known as: LOPRESSOR Take 25 mg by mouth daily.   MSM 1000 MG Tabs Take 1,000 mg by mouth daily.   nitroGLYCERIN 0.3 MG SL tablet Commonly known as: NITROSTAT Place 0.3 mg under the tongue every 5 (five) minutes as needed for chest pain.   predniSONE 20 MG tablet Commonly known as: DELTASONE Take 2 tablets (40 mg total) by mouth daily with breakfast for 3 days.   promethazine-dextromethorphan 6.25-15 MG/5ML  syrup Commonly known as: PROMETHAZINE-DM Take 5 mLs by mouth every 6 (six) hours as needed for cough.   rosuvastatin 20 MG tablet Commonly known as: CRESTOR Take 20 mg by mouth daily.   Symbicort 160-4.5 MCG/ACT inhaler Generic drug: budesonide-formoterol Inhale 2 puffs into the lungs 2 (two) times daily.   tiZANidine 4 MG tablet Commonly known as: ZANAFLEX Take 4 mg by mouth daily as needed for muscle spasms.   triamcinolone cream 0.5 % Commonly known as: KENALOG Apply 1 application topically daily as needed (for sun exposure).               Durable Medical Equipment  (From admission, onward)           Start     Ordered   09/04/22 0801  For home use only DME Nebulizer/meds  Once       Question Answer Comment  Patient needs a nebulizer to treat with the following condition COPD exacerbation ([place])   Length of Need 6 Months      09/04/22 0800            Follow-up  Information     Benito Mccreedy, MD Follow up.   Specialty: Internal Medicine Contact information: [address] [place] [address] [phone]                Discharge Exam: Filed Weights   09/03/22 0625 09/04/22 6283  Weight: 96.4 kg 96.3 kg  BP 114/60 (BP Location: Right Arm)   Pulse 90   Temp 97.7 F (36.5 C) (Oral)   Resp 20   Ht '5\' 4"'$  (1.626 m)   Wt 96.3 kg   SpO2 92%   BMI 36.44 kg/m   Pleasant middle aged female in no distress, smiling, speaking full sentences. Walked yesterday with OT, BP 109/62, no hypoxia or reports of dyspnea. When given option of remaining in hospital another day or discharging, opts to discharge today.  End-expiratory wheezing that is minimal. Last albuterol 10pm yesterday. Nonlabored. SpO2 >94% throughout encounter. RRR, no MRG Abdomen is nontender, nondistended, +BS.  Condition at discharge: stable  The results of significant diagnostics from this hospitalization (including imaging, microbiology, ancillary and  laboratory) are listed below for reference.   Imaging Studies: DG Chest Port 1 View  Result Date: 09/02/2022 CLINICAL DATA:  Short of breath EXAM: PORTABLE CHEST 1 VIEW COMPARISON:  06/23/2022 FINDINGS: Single frontal view of the chest demonstrates a stable cardiac silhouette. No airspace disease, effusion, or pneumothorax. No acute displaced fracture. Stable loop recorder. IMPRESSION: 1. No acute intrathoracic process. Electronically Signed   By: Randa Ngo M.D.   On: 09/02/2022 22:23   CUP PACEART REMOTE DEVICE CHECK  Result Date: 08/30/2022 ILR summary report received. Battery status OK. Normal device function. No new symptom, tachy, brady, or pause episodes. No new AF episodes. Monthly summary reports and ROV/PRN- JJB   Microbiology: Results for orders placed or performed during the hospital encounter of 09/02/22  Blood culture (routine x 2)     Status: None (Preliminary result)   Collection Time: 09/02/22 10:06 PM   Specimen: BLOOD  Result Value Ref Range Status   Specimen Description BLOOD SITE NOT SPECIFIED  Final   Special Requests   Final    BOTTLES DRAWN AEROBIC AND ANAEROBIC Blood Culture adequate volume   Culture   Final    NO GROWTH < 12 HOURS Performed at [facility], [place] [address] [place], [place] [address]    Report Status PENDING  Incomplete  Resp panel by RT-PCR (RSV, Flu A&B, Covid) Anterior Nasal Swab     Status: None   Collection Time: 09/02/22 10:06 PM   Specimen: Anterior Nasal Swab  Result Value Ref Range Status   SARS Coronavirus 2 by RT PCR NEGATIVE NEGATIVE Final   Influenza A by PCR NEGATIVE NEGATIVE Final   Influenza B by PCR NEGATIVE NEGATIVE Final    Comment: (NOTE) The Xpert Xpress SARS-CoV-2/FLU/RSV plus assay is intended as an aid in the diagnosis of influenza from Nasopharyngeal swab specimens and should not be used as a sole basis for treatment. Nasal washings and aspirates are unacceptable for Xpert Xpress  SARS-CoV-2/FLU/RSV testing.  Fact Sheet for Patients: EntrepreneurPulse.com.au  Fact Sheet for Healthcare Providers: IncredibleEmployment.be  This test is not yet approved or cleared by the [place] FDA and has been authorized for detection and/or diagnosis of SARS-CoV-2 by FDA under an Emergency Use Authorization (EUA). This EUA will remain in effect (meaning this test can be used) for the duration of the COVID-19 declaration under Section 564(b)(1) of the Act, 21 U.S.C. section 360bbb-3(b)(1),  unless the authorization is terminated or revoked.     Resp Syncytial Virus by PCR NEGATIVE NEGATIVE Final    Comment: (NOTE) Fact Sheet for Patients: EntrepreneurPulse.com.au  Fact Sheet for Healthcare Providers: IncredibleEmployment.be  This test is not yet approved or cleared by the [place] FDA and has been authorized for detection and/or diagnosis of SARS-CoV-2 by FDA under an Emergency Use Authorization (EUA). This EUA will remain in effect (meaning this test can be used) for the duration of the COVID-19 declaration under Section 564(b)(1) of the Act, 21 U.S.C. section 360bbb-3(b)(1), unless the authorization is terminated or revoked.  Performed at [facility], [place] [address] [place], [place] [address]   Blood culture (routine x 2)     Status: None (Preliminary result)   Collection Time: 09/02/22 10:23 PM   Specimen: BLOOD  Result Value Ref Range Status   Specimen Description BLOOD SITE NOT SPECIFIED  Final   Special Requests   Final    BOTTLES DRAWN AEROBIC AND ANAEROBIC Blood Culture adequate volume   Culture   Final    NO GROWTH < 12 HOURS Performed at [facility], [place] [address], [place], [place] [address]    Report Status PENDING  Incomplete  MRSA Next Gen by PCR, Nasal     Status: None   Collection Time: 09/03/22  6:21 AM   Specimen: Nasal Mucosa; Nasal Swab  Result  Value Ref Range Status   MRSA by PCR Next Gen NOT DETECTED NOT DETECTED Final    Comment: (NOTE) The GeneXpert MRSA Assay (FDA approved for NASAL specimens only), is one component of a comprehensive MRSA colonization surveillance program. It is not intended to diagnose MRSA infection nor to guide or monitor treatment for MRSA infections. Test performance is not FDA approved in patients less than 29 years old. Performed at [facility], [place] [address] [place],  [address]   Respiratory (~20 pathogens) panel by PCR     Status: Abnormal   Collection Time: 09/03/22 10:47 AM   Specimen: Nasopharyngeal Swab; Respiratory  Result Value Ref Range Status   Adenovirus NOT DETECTED NOT DETECTED Final   Coronavirus 229E NOT DETECTED NOT DETECTED Final    Comment: (NOTE) The Coronavirus on the Respiratory Panel, DOES NOT test for the novel  Coronavirus (2019 nCoV)    Coronavirus HKU1 NOT DETECTED NOT DETECTED Final   Coronavirus NL63 NOT DETECTED NOT DETECTED Final   Coronavirus OC43 DETECTED (A) NOT DETECTED Final   Metapneumovirus NOT DETECTED NOT DETECTED Final   Rhinovirus / Enterovirus NOT DETECTED NOT DETECTED Final   Influenza A NOT DETECTED NOT DETECTED Final   Influenza B NOT DETECTED NOT DETECTED Final   Parainfluenza Virus 1 NOT DETECTED NOT DETECTED Final   Parainfluenza Virus 2 NOT DETECTED NOT DETECTED Final   Parainfluenza Virus 3 NOT DETECTED NOT DETECTED Final   Parainfluenza Virus 4 NOT DETECTED NOT DETECTED Final   Respiratory Syncytial Virus NOT DETECTED NOT DETECTED Final   Bordetella pertussis NOT DETECTED NOT DETECTED Final   Bordetella Parapertussis NOT DETECTED NOT DETECTED Final   Chlamydophila pneumoniae NOT DETECTED NOT DETECTED Final   Mycoplasma pneumoniae NOT DETECTED NOT DETECTED Final    Comment: Performed at [facility], [place]. [address] [place],  [address]    Labs: CBC: Recent Labs  Lab 09/02/22 2203 09/03/22 0240  09/03/22 0941  WBC 8.7 11.3* 10.0  HGB 14.2 12.8 11.8*  HCT 41.7 37.4 35.5*  MCV  87.1 87.6 88.1  PLT 194 207 888   Basic Metabolic Panel: Recent Labs  Lab 09/02/22 2203 09/03/22 0240 09/03/22 0941  NA 139 138 137  K 3.3* 3.1* 3.6  CL 102 103 106  CO2 23 18* 20*  GLUCOSE 135* 237* 276*  BUN '10 10 10  '$ CREATININE 0.95 1.19* 0.93  CALCIUM 9.1 8.8* 9.1   Liver Function Tests: Recent Labs  Lab 09/02/22 2203 09/03/22 0240  AST 26 42*  ALT 21 23  ALKPHOS 61 52  BILITOT 0.6 0.2*  PROT 6.8 6.1*  ALBUMIN 3.5 3.1*   CBG: Recent Labs  Lab 09/03/22 1204 09/03/22 1656 09/03/22 2136 09/04/22 0614  GLUCAP 224* 194* 166* 151*    Discharge time spent: greater than 30 minutes.  Signed: Patrecia Pour, MD [facility] 09/04/2022

## 2022-09-04 NOTE — TOC Transition Note (Addendum)
Transition of Care [facility]) - CM/SW Discharge Note   Patient Details  Name: Jocelyn [name] MRN: [id number] Date of Birth: [birth year]  Transition of [facility]) CM/SW Contact:  Carles Collet, RN Phone Number: 09/04/2022, 9:08 AM   Clinical Narrative:     Nebulizer to be delivered to room prior to DC.   Update from DME company that she already had one in November 2022. Patient states that she still has this at home. Patient requested that she be sent home with all neb tubing etc that she has at bedside, and this request has been passed on to the bedside nurse.   Final next level of care: Home/Self Care Barriers to Discharge: No Barriers Identified   Patient Goals and CMS Choice      Discharge Placement                         Discharge Plan and Services Additional resources added to the After Visit Summary for                  DME Arranged: Nebulizer machine DME Agency: [employer] Date DME Agency Contacted: 09/04/22 Time DME Agency Contacted: [phone] Representative spoke with at DME Agency: [place] Determinants of Health ([place]) Interventions SDOH Screenings   Food Insecurity: No Food Insecurity (07/01/2021)  Housing: Low Risk  (07/01/2021)  Transportation Needs: No Transportation Needs (07/01/2021)  Alcohol Screen: Low Risk  (07/01/2021)  Depression (PHQ2-9): Medium Risk (06/22/2021)  Financial Resource Strain: Low Risk  (07/01/2021)  Physical Activity: Inactive (07/01/2021)  Social Connections: Socially Isolated (07/01/2021)  Stress: Stress Concern Present (07/01/2021)  Tobacco Use: High Risk (09/02/2022)     Readmission Risk Interventions    05/21/2021   11:27 AM  Readmission Risk Prevention Plan  Post Dischage Appt Complete  Medication Screening Complete  Transportation Screening Complete

## 2022-09-04 NOTE — Plan of Care (Signed)

## 2022-09-07 LAB — CULTURE, BLOOD (ROUTINE X 2)
Culture: NO GROWTH
Culture: NO GROWTH
Special Requests: ADEQUATE
Special Requests: ADEQUATE

## 2022-09-07 LAB — T3: T3, Total: 84 ng/dL (ref 71–180)

## 2022-09-09 ENCOUNTER — Ambulatory Visit: Payer: Medicare Other | Admitting: Nurse Practitioner

## 2022-09-13 ENCOUNTER — Encounter: Payer: Self-pay | Admitting: Gastroenterology

## 2022-09-13 ENCOUNTER — Ambulatory Visit (INDEPENDENT_AMBULATORY_CARE_PROVIDER_SITE_OTHER): Payer: 59 | Admitting: Gastroenterology

## 2022-09-13 ENCOUNTER — Other Ambulatory Visit: Payer: 59

## 2022-09-13 VITALS — BP 80/60 | HR 68 | Ht 64.0 in | Wt 207.0 lb

## 2022-09-13 DIAGNOSIS — K5909 Other constipation: Secondary | ICD-10-CM | POA: Diagnosis not present

## 2022-09-13 DIAGNOSIS — B3731 Acute candidiasis of vulva and vagina: Secondary | ICD-10-CM | POA: Diagnosis not present

## 2022-09-13 DIAGNOSIS — J441 Chronic obstructive pulmonary disease with (acute) exacerbation: Secondary | ICD-10-CM | POA: Diagnosis not present

## 2022-09-13 DIAGNOSIS — R197 Diarrhea, unspecified: Secondary | ICD-10-CM | POA: Diagnosis not present

## 2022-09-13 DIAGNOSIS — I2510 Atherosclerotic heart disease of native coronary artery without angina pectoris: Secondary | ICD-10-CM | POA: Diagnosis not present

## 2022-09-13 DIAGNOSIS — E039 Hypothyroidism, unspecified: Secondary | ICD-10-CM | POA: Diagnosis not present

## 2022-09-13 DIAGNOSIS — I10 Essential (primary) hypertension: Secondary | ICD-10-CM | POA: Diagnosis not present

## 2022-09-13 DIAGNOSIS — E785 Hyperlipidemia, unspecified: Secondary | ICD-10-CM | POA: Diagnosis not present

## 2022-09-13 DIAGNOSIS — M109 Gout, unspecified: Secondary | ICD-10-CM | POA: Diagnosis not present

## 2022-09-13 NOTE — Progress Notes (Signed)
Agree with assessment and plan as outlined, could have antibiotic related diarrhea, but agree with ruling out C. difficile.  I think yield of colonoscopy would be low for acute diarrhea.  Will await stool studies.  Thanks

## 2022-09-13 NOTE — Patient Instructions (Signed)
If you are age 62 or older, your body mass index should be between 23-30. Your Body mass index is 35.53 kg/m. If this is out of the aforementioned range listed, please consider follow up with your Primary Care Provider.  If you are age 10 or younger, your body mass index should be between 19-25. Your Body mass index is 35.53 kg/m. If this is out of the aformentioned range listed, please consider follow up with your Primary Care Provider.   Your provider has requested that you go to the basement level for lab work before leaving today. Press "B" on the elevator. The lab is located at the first door on the left as you exit the elevator.    The [facility] providers would like to encourage you to use [facility] to communicate with providers for non-urgent requests or questions.  Due to long hold times on the telephone, sending your provider a message by [facility] may be a faster and more efficient way to get a response.  Please allow 48 business hours for a response.  Please remember that this is for non-urgent requests.   It was a pleasure to see you today!  Thank you for trusting me with your gastrointestinal care!    Alonza Bogus, PA-c

## 2022-09-13 NOTE — Progress Notes (Signed)
09/13/2022 Vidal Schwalbe AMOREE MCREE BP:[id number] 11/30/1960   HISTORY OF PRESENT ILLNESS:  This is a 62 year old female who is a patient of Dr. Doyne Keel.  She normally battles with constipation, but since hospitalized recently with pneumonia, COPD, strain of coronavirus on abx, she has been having diarrhea.  She says that usually she has severe constipation all the time and has tried MiraLAX, Metamucil, Linzess, and prune juice with milk of magnesia, etc.  Says that since being in the hospital her stools have been loose 3-4 times a day.  She is asking about another colonoscopy.  No rectal bleeding.  She has a history of CVA and is on Plavix.  Her father has a history of colon cancer in age 59s. She last had a colonoscopy in September 2019, had a hyperplastic polyp removed and no other concerning pathology, was told to repeat in 10 years.   Past Medical History:  Diagnosis Date   Anxiety    Arthritis    Chronic diastolic CHF (congestive heart failure), NYHA class 2 (HCC)    COPD (chronic obstructive pulmonary disease) (HCC)    Depression    HTN (hypertension)    Hypercholesterolemia    Hypothyroidism (acquired)    Kidney stones 2015   Myocardial infarction [facility])    x3- pt unsure if MIs were in 1996 or 1999, had stent fixed in 2016 but no MI at that time per pt   Stroke [facility]) 06/2021   Thyrotoxicosis    TIA (transient ischemic attack)    2013   Past Surgical History:  Procedure Laterality Date   BREAST CYST EXCISION     BUBBLE STUDY  06/10/2021   Procedure: BUBBLE STUDY;  Surgeon: Lelon Perla, MD;  Location: [place];  Service: Cardiovascular;;   CARDIAC CATHETERIZATION N/A 08/24/2015   Procedure: Left Heart Cath and Coronary Angiography;  Surgeon: Jettie Booze, MD;  Location: [place] [facility];  Service: Cardiovascular;  Laterality: N/A;   COLONOSCOPY     CORONARY [place]   and then had procedure to repair in 2016   [place] N/A 06/10/2021   Procedure: LOOP RECORDER INSERTION;  Surgeon: Thompson Grayer, MD;  Location: [place] [facility];  Service: Cardiovascular;  Laterality: N/A;   partial hysterec     TEE WITHOUT CARDIOVERSION N/A 06/10/2021   Procedure: TRANSESOPHAGEAL ECHOCARDIOGRAM (TEE);  Surgeon: Lelon Perla, MD;  Location: [facility] ENDOSCOPY;  Service: Cardiovascular;  Laterality: N/A;   THYROIDECTOMY      reports that she has been smoking cigarettes. She has been smoking an average of .5 packs per day. She has been exposed to tobacco smoke. She has never used smokeless tobacco. She reports that she does not drink alcohol and does not use drugs. family history includes Cancer in her brother; Colon cancer in her father; Diabetes in her maternal grandmother; Gout in her brother and brother; Berenice Primas' disease in her father; Heart disease in her father and mother; Hypertension in her maternal grandmother and mother; Stroke in her brother and mother. Allergies  Allergen Reactions   Hydrocodone Itching    Has problems with high doses      Outpatient Encounter Medications as of 09/13/2022  Medication Sig   albuterol (PROVENTIL HFA;VENTOLIN HFA) 108 (90 Base) MCG/ACT inhaler Inhale 2 puffs into the lungs every 6 (six) hours as needed for wheezing or shortness of breath.   albuterol (PROVENTIL) (2.5 MG/3ML) 0.083% nebulizer solution Take  3 mLs (2.5 mg total) by nebulization every 4 (four) hours as needed for wheezing or shortness of breath.   ALPRAZolam (XANAX) 1 MG tablet Take 1 mg by mouth daily.   aspirin EC 81 MG tablet Take 81 mg by mouth daily. Swallow whole.   clopidogrel (PLAVIX) 75 MG tablet Take 75 mg by mouth daily.   fluconazole (DIFLUCAN) 150 MG tablet take 1 tablet (163m) by mouth as needed for yeast infection, may repeat if needed after 72 hours   furosemide (LASIX) 40 MG tablet Take 40 mg by mouth daily.   gabapentin (NEURONTIN) 300 MG capsule Take 300 mg by mouth at bedtime as needed  (pain).   ipratropium-albuterol (DUONEB) 0.5-2.5 (3) MG/3ML SOLN Take 3 mLs by nebulization every 6 (six) hours as needed. (Patient taking differently: Take 3 mLs by nebulization every 6 (six) hours as needed (shortness of breath, wheezing).)   isosorbide mononitrate (IMDUR) 30 MG 24 hr tablet Take 30 mg by mouth daily.   levothyroxine (SYNTHROID) 100 MCG tablet Take 100 mcg by mouth daily.   MELATONIN PO Take 10-20 mg by mouth at bedtime as needed (sleep).   Methylsulfonylmethane (MSM) 1000 MG TABS Take 1,000 mg by mouth daily.   metoprolol tartrate (LOPRESSOR) 25 MG tablet Take 25 mg by mouth daily.   rosuvastatin (CRESTOR) 20 MG tablet Take 20 mg by mouth daily.   SYMBICORT 160-4.5 MCG/ACT inhaler Inhale 2 puffs into the lungs 2 (two) times daily.   triamcinolone cream (KENALOG) 0.5 % Apply 1 application topically daily as needed (for sun exposure).   nitroGLYCERIN (NITROSTAT) 0.3 MG SL tablet Place 0.3 mg under the tongue every 5 (five) minutes as needed for chest pain. (Patient not taking: Reported on 09/03/2022)   [DISCONTINUED] promethazine-dextromethorphan (PROMETHAZINE-DM) 6.25-15 MG/5ML syrup Take 5 mLs by mouth every 6 (six) hours as needed for cough.   [DISCONTINUED] tiZANidine (ZANAFLEX) 4 MG tablet Take 4 mg by mouth daily as needed for muscle spasms.   No facility-administered encounter medications on file as of 09/13/2022.     REVIEW OF SYSTEMS  : All other systems reviewed and negative except where noted in the History of Present Illness.   PHYSICAL EXAM: BP (!) 80/60 (BP Location: Left Arm, Patient Position: Sitting, Cuff Size: Normal)   Pulse 68 Comment: irregular  Ht 5' 4"$  (1.626 m)   Wt 207 lb (93.9 kg)   BMI 35.53 kg/m  General: Well developed AA female in no acute distress Head: Normocephalic and atraumatic Eyes:  Sclerae anicteric, conjunctiva pink. Ears: Normal auditory acuity Lungs: Clear throughout to auscultation; no W/R/R. Heart: Regular rate and rhythm; no  M/R/G. Abdomen: Soft, non-distended.  BS present.  Non-tender. Musculoskeletal: Symmetrical with no gross deformities  Skin: No lesions on visible extremities Extremities: No edema  Neurological: Alert oriented x 4, grossly non-focal Psychological:  Alert and cooperative. Normal mood and affect  ASSESSMENT AND PLAN: *62year old female who normally battles with constipation, but since hospitalized recently with pneumonia, COPD, strain of coronavirus on abx, she has been having diarrhea.  Will check Cdiff first.  If diarrhea resolves and constipation returns then will address and treat that at that point.  She is asking about another colonoscopy.  I do not think that it is absolutely necessary, but not totally opposed to it.  She is on Plavix so that would need to be held.  Will decide after Cdiff stool results.    CC:  OBenito Mccreedy MD

## 2022-09-14 ENCOUNTER — Other Ambulatory Visit: Payer: 59

## 2022-09-14 DIAGNOSIS — R197 Diarrhea, unspecified: Secondary | ICD-10-CM | POA: Diagnosis not present

## 2022-09-14 DIAGNOSIS — K5909 Other constipation: Secondary | ICD-10-CM

## 2022-09-16 LAB — CLOSTRIDIUM DIFFICILE BY PCR: Toxigenic C. Difficile by PCR: NEGATIVE

## 2022-09-19 ENCOUNTER — Telehealth: Payer: Self-pay

## 2022-09-19 ENCOUNTER — Other Ambulatory Visit: Payer: Self-pay

## 2022-09-19 DIAGNOSIS — K5909 Other constipation: Secondary | ICD-10-CM

## 2022-09-19 DIAGNOSIS — R197 Diarrhea, unspecified: Secondary | ICD-10-CM

## 2022-09-19 MED ORDER — NA SULFATE-K SULFATE-MG SULF 17.5-3.13-1.6 GM/177ML PO SOLN: 1.0000 | Freq: Once | ORAL | 0 refills | Status: AC

## 2022-09-19 NOTE — Telephone Encounter (Signed)
[place], MD  Chart reviewed as part of pre-operative protocol coverage. Because of Kaysey M Brodzinski's past medical history and time since last visit, he/she will require a follow-up visit in order to better assess preoperative cardiovascular risk.  Pre-op covering staff: - Please schedule appointment and call patient to inform them. - Please contact requesting surgeon's office via preferred method (i.e, phone, fax) to inform them of need for appointment prior to surgery.  Recommend that request to hold Plavix is additionally cleared with neurology due to hx of CVA.    Emmaline Life, NP-C  09/19/2022, 11:58 AM [address], [address] Office [phone] Fax [phone]

## 2022-09-19 NOTE — Telephone Encounter (Signed)
[facility] Pre-operative Risk Assessment     Request for surgical clearance:     Endoscopy Procedure  What type of surgery is being performed?     Colon  When is this surgery scheduled?     10/19/22  What type of clearance is required ?   Pharmacy  Are there any medications that need to be held prior to surgery and how long? Plavix  Practice name and name of physician performing surgery?      [facility]  What is your office phone and fax number?      Phone- [phone]  [phone]  Anesthesia type (None, local, MAC, general) ?       MAC

## 2022-09-20 NOTE — Telephone Encounter (Signed)
I s/w the pt and she has been scheduled in office appt with Dr. Irish Lack 09/26/22 @ 2 pm. I will update all parties involved.

## 2022-09-23 NOTE — Telephone Encounter (Signed)
Jocelyn [name] see message below per cardiology pt needs appt with neurology  Left message on machine to call back to discuss with pt

## 2022-09-25 NOTE — Progress Notes (Unsigned)
Cardiology Office Note   Date:  09/26/2022   ID:  Jocelyn [name], DOB 1961-01-09, MRN [id number]  PCP:  Benito Mccreedy, MD    No chief complaint on file.  CAD  Wt Readings from Last 3 Encounters:  09/26/22 209 lb (94.8 kg)  09/13/22 207 lb (93.9 kg)  09/04/22 212 lb 4.9 oz (96.3 kg)       History of Present Illness: Jocelyn [name] is a 62 y.o. female   who has had coronary artery disease. She had a chronically occluded obtuse marginal noted on catheterization from January 2017.    In the past, "She added garlic tabs and honey and this has helped her feel better. She is cutting back on meat. She juices for nutrition. "   Hospitalized in 10/21, records showed: " Presented with abdominal pain.  Imagings showed a splenic/renal infarct suspicion for cardiomegaly stroke.  Echo showed left ventricular thrombus.  Cardiology was consulted and following.  Started on heparin drip and warfarin.    Goal INR should be 2-3.  She does not have any history of clotting disorder, DVT or PE in the past."   She initially thought her pain was from eating something bad.  THen further w/u was done due to left side pain.  Clot was attributed to tobacco use and estrogen use.  She had success in the past with Chantix.     In 10/21, records showed: "She wants to come off of estradiol.  She wants to take honey/garlic supplement combo, but was told to stop it due to warfarin.  She thinks this combo cured her BP, and keeps plaque out of her vessel.      She is going to try patches to stop smoking."   1/22 echo showed EF 45% and resolution of LV thrombus.  "LV thrombus has resolved.  LVEF slightly better.  Is she avoiding tobacco and stopped her estrogen therapy, as these likely played a part in her LV thrombus formation ( although she mentioned she thought it was the COVID vaccine that did it- Moderna)? If so, we can consider stopping Coumadin in the near future. "   At the 1/22 visit: "She has stopped  the supplemental estrogen.  She has cut back on her cigarettes and is down to half pack per day.  She would like to come off Coumadin as soon as possible."   She had a stroke in 06/2021.  EP saw the patient: "  left parietal and temporal lobe infarct embolic secondary to likely thromboembolic source in a patient with recent LV thrombus.  However present 2D echo shows no definite thrombus despite patient being off anticoagulation.  she has undergone workup for stroke including echocardiogram and carotid angio.  The patient has been monitored on telemetry which has demonstrated sinus rhythm with no arrhythmias. "   ILR was placed.  Aspirin and Plavix were started.  LVEF 40 to 45% in November 2022.  Wall motion abnormality suggestive of prior circumflex infarct.  Planning colonoscopy in March 2024 due to constipation.  She needs guidance regarding Plavix therapy.    Past Medical History:  Diagnosis Date   Anxiety    Arthritis    Chronic diastolic CHF (congestive heart failure), NYHA class 2 (HCC)    COPD (chronic obstructive pulmonary disease) (HCC)    Depression    HTN (hypertension)    Hypercholesterolemia    Hypothyroidism (acquired)    Kidney stones 2015   Myocardial infarction ([place])  x3- pt unsure if MIs were in 1996 or 1999, had stent fixed in 2016 but no MI at that time per pt   Stroke [name]) 06/2021   Thyrotoxicosis    TIA (transient ischemic attack)    2013    Past Surgical History:  Procedure Laterality Date   BREAST CYST EXCISION     BUBBLE STUDY  06/10/2021   Procedure: BUBBLE STUDY;  Surgeon: Lelon Perla, MD;  Location: [place];  Service: Cardiovascular;;   CARDIAC CATHETERIZATION N/A 08/24/2015   Procedure: Left Heart Cath and Coronary Angiography;  Surgeon: Jettie Booze, MD;  Location: [place] [facility];  Service: Cardiovascular;  Laterality: N/A;   COLONOSCOPY     CORONARY [place]   and then had procedure to repair in 2016   [place] N/A 06/10/2021   Procedure: LOOP RECORDER INSERTION;  Surgeon: Thompson Grayer, MD;  Location: [place] [facility];  Service: Cardiovascular;  Laterality: N/A;   partial hysterec     TEE WITHOUT CARDIOVERSION N/A 06/10/2021   Procedure: TRANSESOPHAGEAL ECHOCARDIOGRAM (TEE);  Surgeon: Lelon Perla, MD;  Location: [facility] ENDOSCOPY;  Service: Cardiovascular;  Laterality: N/A;   THYROIDECTOMY       Current Outpatient Medications  Medication Sig Dispense Refill   albuterol (PROVENTIL HFA;VENTOLIN HFA) 108 (90 Base) MCG/ACT inhaler Inhale 2 puffs into the lungs every 6 (six) hours as needed for wheezing or shortness of breath.     albuterol (PROVENTIL) (2.5 MG/3ML) 0.083% nebulizer solution Take 3 mLs (2.5 mg total) by nebulization every 4 (four) hours as needed for wheezing or shortness of breath. 75 mL 0   ALPRAZolam (XANAX) 1 MG tablet Take 1 mg by mouth daily.     aspirin EC 81 MG tablet Take 81 mg by mouth daily. Swallow whole.     clopidogrel (PLAVIX) 75 MG tablet Take 75 mg by mouth daily.     cyclobenzaprine (FLEXERIL) 10 MG tablet Take by mouth 3 (three) times daily as needed.     furosemide (LASIX) 40 MG tablet Take 40 mg by mouth daily.     gabapentin (NEURONTIN) 300 MG capsule Take 300 mg by mouth at bedtime as needed (pain).     ipratropium-albuterol (DUONEB) 0.5-2.5 (3) MG/3ML SOLN Take 3 mLs by nebulization every 6 (six) hours as needed. (Patient taking differently: Take 3 mLs by nebulization every 6 (six) hours as needed (shortness of breath, wheezing).) 360 mL 1   isosorbide mononitrate (IMDUR) 30 MG 24 hr tablet Take 30 mg by mouth daily.     levothyroxine (SYNTHROID) 100 MCG tablet Take 100 mcg by mouth daily.     MELATONIN PO Take 10-20 mg by mouth at bedtime as needed (sleep).     Methylsulfonylmethane (MSM) 1000 MG TABS Take 1,000 mg by mouth daily.     metoprolol tartrate (LOPRESSOR) 25 MG tablet Take 25 mg by mouth daily.     nitroGLYCERIN  (NITROSTAT) 0.3 MG SL tablet Place 0.3 mg under the tongue every 5 (five) minutes as needed for chest pain.  0   potassium chloride (KLOR-CON M) 10 MEQ tablet Take by mouth.     rosuvastatin (CRESTOR) 20 MG tablet Take 20 mg by mouth daily.     SYMBICORT 160-4.5 MCG/ACT inhaler Inhale 2 puffs into the lungs 2 (two) times daily.     triamcinolone cream (KENALOG) 0.5 % Apply 1 application topically daily as needed (for sun exposure).  fluconazole (DIFLUCAN) 150 MG tablet take 1 tablet ('150mg'$ ) by mouth as needed for yeast infection, may repeat if needed after 72 hours (Patient not taking: Reported on 09/26/2022) 2 tablet 0   No current facility-administered medications for this visit.    Allergies:   Hydrocodone    Social History:  The patient  reports that she has been smoking cigarettes. She has been smoking an average of .5 packs per day. She has been exposed to tobacco smoke. She has never used smokeless tobacco. She reports that she does not drink alcohol and does not use drugs.   Family History:  The patient's family history includes Cancer in her brother; Colon cancer in her father; Diabetes in her maternal grandmother; Gout in her brother and brother; Berenice Primas' disease in her father; Heart disease in her father and mother; Hypertension in her maternal grandmother and mother; Stroke in her brother and mother.    ROS:  Please see the history of present illness.   Otherwise, review of systems are positive for constipation.   All other systems are reviewed and negative.    PHYSICAL EXAM: VS:  BP 90/60   Pulse 78   Ht '5\' 4"'$  (1.626 m)   Wt 209 lb (94.8 kg)   SpO2 98%   BMI 35.87 kg/m  , BMI Body mass index is 35.87 kg/m. GEN: Well nourished, well developed, in no acute distress HEENT: normal Neck: no JVD, carotid bruits, or masses Cardiac: RRR; no murmurs, rubs, or gallops,no edema  Respiratory:  clear to auscultation bilaterally, normal work of breathing GI: soft, nontender,  nondistended, + BS MS: no deformity or atrophy Skin: warm and dry, no rash Neuro:  Strength and sensation are intact Psych: euthymic mood, full affect  NSR, no ST changes in 11/23  Recent Labs: 09/02/2022: B Natriuretic Peptide 67.8; TSH 0.247 09/03/2022: ALT 23; BUN 10; Creatinine, Ser 0.93; Hemoglobin 11.8; Platelets 196; Potassium 3.6; Sodium 137   Lipid Panel    Component Value Date/Time   CHOL 183 02/23/2022 1531   TRIG 128 02/23/2022 1531   HDL 82 02/23/2022 1531   CHOLHDL 2.2 02/23/2022 1531   CHOLHDL 3.9 06/09/2021 0355   VLDL 20 06/09/2021 0355   LDLCALC 79 02/23/2022 1531     Other studies Reviewed: Additional studies/ records that were reviewed today with results demonstrating: labs reviewed.   ASSESSMENT ND PLAN:  CAD: No angina.  Avoid red meat.  Exercise target noted below. LV dysfunction: Improved.  LV thrombus resolved.  Last EF 40 to 45%. Hypertension:Low BP readings.  Occasional dizziness.  Stop metoprolol and see if dizziness improves or if there are any other symptoms. Prediabetes: A1C 6.1.  Whole food plant-based diet and high-fiber foods.  Avoid processed foods.. Hyperlipidemia: July 2023 HDL 82 LDL 79 triglycerides 128 total cholesterol 183.  The current medical regimen is effective;  continue present plan and medications. Aortic atherosclerosis: Continue rosuvastatin therapy.  Whole food, plant-based diet recommended.  High-fiber diet.  Avoid processed foods. Tobacco abuse:  Sh would benefit from smoking cessation. Morbid obesity: She has lost some weight since last year. OK to hold aspirin for 7 days and plavix for 5 days prior to colonoscopy.     Current medicines are reviewed at length with the patient today.  The patient concerns regarding her medicines were addressed.  The following changes have been made:  OK to stop metoprolol due to low BP  Labs/ tests ordered today include:  No orders of the defined types were  placed in this  encounter.   Recommend 150 minutes/week of aerobic exercise Low fat, low carb, high fiber diet recommended  Disposition:   FU in 1 year   Signed, Larae Grooms, MD  09/26/2022 2:01 PM    [facility] [place], [place], [place]  [address] Phone: [phone]; Fax: [phone]

## 2022-09-26 ENCOUNTER — Ambulatory Visit: Payer: 59 | Attending: Interventional Cardiology | Admitting: Interventional Cardiology

## 2022-09-26 ENCOUNTER — Encounter: Payer: Self-pay | Admitting: Interventional Cardiology

## 2022-09-26 VITALS — BP 90/60 | HR 78 | Ht 64.0 in | Wt 209.0 lb

## 2022-09-26 DIAGNOSIS — Z720 Tobacco use: Secondary | ICD-10-CM | POA: Diagnosis not present

## 2022-09-26 DIAGNOSIS — I10 Essential (primary) hypertension: Secondary | ICD-10-CM | POA: Diagnosis not present

## 2022-09-26 DIAGNOSIS — E782 Mixed hyperlipidemia: Secondary | ICD-10-CM

## 2022-09-26 DIAGNOSIS — I700 Atherosclerosis of aorta: Secondary | ICD-10-CM | POA: Diagnosis not present

## 2022-09-26 DIAGNOSIS — I25119 Atherosclerotic heart disease of native coronary artery with unspecified angina pectoris: Secondary | ICD-10-CM | POA: Diagnosis not present

## 2022-09-26 NOTE — Patient Instructions (Addendum)
Medication Instructions:  Your physician has recommended you make the following change in your medication: Stop metoprolol  *If you need a refill on your cardiac medications before your next appointment, please call your pharmacy*   Lab Work: none If you have labs (blood work) drawn today and your tests are completely normal, you will receive your results only by: [place] (if you have MyChart) OR A paper copy in the mail If you have any lab test that is abnormal or we need to change your treatment, we will call you to review the results.   Testing/Procedures: none   Follow-Up: At [facility], you and your health needs are our priority.  As part of our continuing mission to provide you with exceptional heart care, we have created designated Provider Care Teams.  These Care Teams include your primary Cardiologist (physician) and Advanced Practice Providers (APPs -  Physician Assistants and Nurse Practitioners) who all work together to provide you with the care you need, when you need it.  We recommend signing up for the patient portal called "MyChart".  Sign up information is provided on this After Visit Summary.  MyChart is used to connect with patients for Virtual Visits (Telemedicine).  Patients are able to view lab/test results, encounter notes, upcoming appointments, etc.  Non-urgent messages can be sent to your provider as well.   To learn more about what you can do with MyChart, go to [email or web address].    Your next appointment:   12 month(s)  Provider:   Larae Grooms, MD     Other Instructions   You can stop aspirin 7 days prior and clopidogrel 5 days prior to upcoming colonoscopy

## 2022-09-27 ENCOUNTER — Telehealth: Payer: Self-pay

## 2022-09-27 NOTE — Telephone Encounter (Signed)
-----   Message from Yetta Flock, MD sent at 09/26/2022  5:53 PM EST ----- Woodroe Chen I think you helped schedule this case.  She is cleared to hold Plavix for 5 days prior to colonoscopy.  She does not need to hold the aspirin from my perspective and can take that throughout. Thanks   ----- Message ----- From: Jettie Booze, MD Sent: 09/26/2022   2:39 PM EST To: Yetta Flock, MD  OK to hold aspirin for 7 days and plavix for 5 days prior to colonoscopy.

## 2022-09-27 NOTE — Progress Notes (Signed)
Carelink Summary Report / Loop Recorder 

## 2022-09-27 NOTE — Telephone Encounter (Signed)
The patient has been notified of this information and all questions answered.

## 2022-09-28 ENCOUNTER — Ambulatory Visit: Payer: 59

## 2022-09-28 DIAGNOSIS — I639 Cerebral infarction, unspecified: Secondary | ICD-10-CM

## 2022-09-28 LAB — CUP PACEART REMOTE DEVICE CHECK
Date Time Interrogation Session: 20240227231314
Implantable Pulse Generator Implant Date: 20221110

## 2022-09-29 DIAGNOSIS — Z0001 Encounter for general adult medical examination with abnormal findings: Secondary | ICD-10-CM | POA: Diagnosis not present

## 2022-09-29 DIAGNOSIS — E785 Hyperlipidemia, unspecified: Secondary | ICD-10-CM | POA: Diagnosis not present

## 2022-09-29 DIAGNOSIS — E039 Hypothyroidism, unspecified: Secondary | ICD-10-CM | POA: Diagnosis not present

## 2022-09-29 DIAGNOSIS — I2510 Atherosclerotic heart disease of native coronary artery without angina pectoris: Secondary | ICD-10-CM | POA: Diagnosis not present

## 2022-09-29 DIAGNOSIS — I10 Essential (primary) hypertension: Secondary | ICD-10-CM | POA: Diagnosis not present

## 2022-09-29 DIAGNOSIS — N179 Acute kidney failure, unspecified: Secondary | ICD-10-CM | POA: Diagnosis not present

## 2022-10-03 ENCOUNTER — Ambulatory Visit: Payer: Medicare Other

## 2022-10-11 ENCOUNTER — Encounter: Payer: Self-pay | Admitting: Gastroenterology

## 2022-10-19 ENCOUNTER — Encounter: Payer: Self-pay | Admitting: Gastroenterology

## 2022-10-19 ENCOUNTER — Ambulatory Visit (AMBULATORY_SURGERY_CENTER): Payer: 59 | Admitting: Gastroenterology

## 2022-10-19 VITALS — BP 124/70 | HR 77 | Temp 98.7°F | Resp 26 | Ht 64.0 in | Wt 209.0 lb

## 2022-10-19 DIAGNOSIS — R194 Change in bowel habit: Secondary | ICD-10-CM

## 2022-10-19 MED ORDER — SODIUM CHLORIDE 0.9 % IV SOLN: 500.0000 mL | Freq: Once | INTRAVENOUS | Status: DC

## 2022-10-19 NOTE — Progress Notes (Signed)
VS by EC  Pt's states no medical or surgical changes since previsit or office visit.  

## 2022-10-19 NOTE — Progress Notes (Signed)
[facility] History and Physical   Primary Care Physician:  Benito Mccreedy, MD   Reason for Procedure:   Change in bowel habits / diarrhea  Plan:    colonoscopy     HPI: Jocelyn [name] is a 62 y.o. female  here for colonoscopy to evaluate bowel symptoms - persistent loose stools and history of constipation. Post hospitalization had dealt with ongoing loose stools new for her since then in February. C Diff negative. History of CVA on Plavix which has been held for 5 days. Last exam 2019  Father had CRC dx age 33s. Otherwise feels well without any cardiopulmonary symptoms.   I have discussed risks / benefits of anesthesia and endoscopic procedure with Laddie Aquas and they wish to proceed with the exams as outlined today.    Past Medical History:  Diagnosis Date   Anxiety    Arthritis    Chronic diastolic CHF (congestive heart failure), NYHA class 2 (HCC)    COPD (chronic obstructive pulmonary disease) (HCC)    Depression    HTN (hypertension)    Hypercholesterolemia    Hypothyroidism (acquired)    Kidney stones 2015   Myocardial infarction [facility])    x3- pt unsure if MIs were in 1996 or 1999, had stent fixed in 2016 but no MI at that time per pt   Stroke [facility]) 06/2021   Thyrotoxicosis    TIA (transient ischemic attack)    2013    Past Surgical History:  Procedure Laterality Date   BREAST CYST EXCISION     BUBBLE STUDY  06/10/2021   Procedure: BUBBLE STUDY;  Surgeon: Lelon Perla, MD;  Location: [place];  Service: Cardiovascular;;   CARDIAC CATHETERIZATION N/A 08/24/2015   Procedure: Left Heart Cath and Coronary Angiography;  Surgeon: Jettie Booze, MD;  Location: [place] [facility];  Service: Cardiovascular;  Laterality: N/A;   COLONOSCOPY     CORONARY [place]   and then had procedure to repair in 2016   [place] N/A 06/10/2021   Procedure: LOOP RECORDER INSERTION;  Surgeon: Thompson Grayer,  MD;  Location: [place] [facility];  Service: Cardiovascular;  Laterality: N/A;   partial hysterec     TEE WITHOUT CARDIOVERSION N/A 06/10/2021   Procedure: TRANSESOPHAGEAL ECHOCARDIOGRAM (TEE);  Surgeon: Lelon Perla, MD;  Location: [facility] ENDOSCOPY;  Service: Cardiovascular;  Laterality: N/A;   THYROIDECTOMY      Prior to Admission medications   Medication Sig Start Date End Date Taking? Authorizing Provider  ALPRAZolam [name]) 1 MG tablet Take 1 mg by mouth daily. 05/10/20  Yes [provider]  aspirin EC 81 MG tablet Take 81 mg by mouth daily. Swallow whole.   Yes [provider]  clopidogrel (PLAVIX) 75 MG tablet Take 75 mg by mouth daily.   Yes [provider]  furosemide (LASIX) 40 MG tablet Take 40 mg by mouth daily.   Yes [provider]  levothyroxine (SYNTHROID) 100 MCG tablet Take 100 mcg by mouth daily. 11/02/21  Yes [provider]  rosuvastatin (CRESTOR) 20 MG tablet Take 20 mg by mouth daily. 06/07/22  Yes [provider]  SYMBICORT 160-4.5 MCG/ACT inhaler Inhale 2 puffs into the lungs 2 (two) times daily. 08/08/22  Yes [provider]  albuterol (PROVENTIL HFA;VENTOLIN HFA) 108 (90 Base) MCG/ACT inhaler Inhale 2 puffs into the lungs every 6 (six) hours as needed for wheezing or shortness of breath. 06/21/15  09/04/23  [provider]  albuterol (PROVENTIL) (2.5 MG/3ML) 0.083% nebulizer solution Take 3 mLs (2.5 mg total) by nebulization every 4 (four) hours as needed for wheezing or shortness of breath. 09/04/22   Patrecia Pour, MD  cyclobenzaprine (FLEXERIL) 10 MG tablet Take by mouth 3 (three) times daily as needed. 06/30/15   [provider]  fluconazole (DIFLUCAN) 150 MG tablet take 1 tablet (150mg ) by mouth as needed for yeast infection, may repeat if needed after 72 hours Patient not taking: Reported on 09/26/2022 09/04/22   Patrecia Pour, MD  gabapentin (NEURONTIN) 300 MG capsule Take 300 mg by mouth at  bedtime as needed (pain). 10/05/21   [provider]  ipratropium-albuterol (DUONEB) 0.5-2.5 (3) MG/3ML SOLN Take 3 mLs by nebulization every 6 (six) hours as needed. Patient taking differently: Take 3 mLs by nebulization every 6 (six) hours as needed (shortness of breath, wheezing). 05/21/21   [name], [name], MD  isosorbide mononitrate (IMDUR) 30 MG 24 hr tablet Take 30 mg by mouth daily. Patient not taking: Reported on 10/19/2022    [provider]  MELATONIN PO Take 10-20 mg by mouth at bedtime as needed (sleep).    [provider]  Methylsulfonylmethane (MSM) 1000 MG TABS Take 1,000 mg by mouth daily.    [provider]  montelukast (SINGULAIR) 10 MG tablet Take 10 mg by mouth daily. 10/12/22   [provider]  nitroGLYCERIN (NITROSTAT) 0.3 MG SL tablet Place 0.3 mg under the tongue every 5 (five) minutes as needed for chest pain. 10/28/15   [provider]  potassium chloride (KLOR-CON M) 10 MEQ tablet Take by mouth. 07/06/15   [provider]  triamcinolone cream (KENALOG) 0.5 % Apply 1 application topically daily as needed (for sun exposure).    [provider]    Current Outpatient Medications  Medication Sig Dispense Refill   ALPRAZolam (XANAX) 1 MG tablet Take 1 mg by mouth daily.     aspirin EC 81 MG tablet Take 81 mg by mouth daily. Swallow whole.     clopidogrel (PLAVIX) 75 MG tablet Take 75 mg by mouth daily.     furosemide (LASIX) 40 MG tablet Take 40 mg by mouth daily.     levothyroxine (SYNTHROID) 100 MCG tablet Take 100 mcg by mouth daily.     rosuvastatin (CRESTOR) 20 MG tablet Take 20 mg by mouth daily.     SYMBICORT 160-4.5 MCG/ACT inhaler Inhale 2 puffs into the lungs 2 (two) times daily.     albuterol (PROVENTIL HFA;VENTOLIN HFA) 108 (90 Base) MCG/ACT inhaler Inhale 2 puffs into the lungs every 6 (six) hours as needed for wheezing or shortness of breath.     albuterol (PROVENTIL) (2.5 MG/3ML) 0.083%  nebulizer solution Take 3 mLs (2.5 mg total) by nebulization every 4 (four) hours as needed for wheezing or shortness of breath. 75 mL 0   cyclobenzaprine (FLEXERIL) 10 MG tablet Take by mouth 3 (three) times daily as needed.     fluconazole (DIFLUCAN) 150 MG tablet take 1 tablet (150mg ) by mouth as needed for yeast infection, may repeat if needed after 72 hours (Patient not taking: Reported on 09/26/2022) 2 tablet 0   gabapentin (NEURONTIN) 300 MG capsule Take 300 mg by mouth at bedtime as needed (pain).     ipratropium-albuterol (DUONEB) 0.5-2.5 (3) MG/3ML SOLN Take 3 mLs by nebulization every 6 (six) hours as needed. (Patient taking differently: Take 3 mLs by nebulization every 6 (six) hours  as needed (shortness of breath, wheezing).) 360 mL 1   isosorbide mononitrate (IMDUR) 30 MG 24 hr tablet Take 30 mg by mouth daily. (Patient not taking: Reported on 10/19/2022)     MELATONIN PO Take 10-20 mg by mouth at bedtime as needed (sleep).     Methylsulfonylmethane (MSM) 1000 MG TABS Take 1,000 mg by mouth daily.     montelukast (SINGULAIR) 10 MG tablet Take 10 mg by mouth daily.     nitroGLYCERIN (NITROSTAT) 0.3 MG SL tablet Place 0.3 mg under the tongue every 5 (five) minutes as needed for chest pain.  0   potassium chloride (KLOR-CON M) 10 MEQ tablet Take by mouth.     triamcinolone cream (KENALOG) 0.5 % Apply 1 application topically daily as needed (for sun exposure).     Current Facility-Administered Medications  Medication Dose Route Frequency Provider Last Rate Last Admin   0.9 %  sodium chloride infusion  500 mL Intravenous Once Dymin Dingledine, Carlota Raspberry, MD        Allergies as of 10/19/2022 - Review Complete 10/19/2022  Allergen Reaction Noted   Hydrocodone Itching 10/29/2016    Family History  Problem Relation Age of Onset   Heart disease Mother    Stroke Mother    Hypertension Mother    Heart disease Father    Berenice Primas' disease Father    Colon cancer Father        pt unsure of age  onset, believes he was in his 20s   Gout Brother    Gout Brother    Cancer Brother    Stroke Brother    Diabetes Maternal Grandmother    Hypertension Maternal Grandmother    Heart attack Neg Hx    Esophageal cancer Neg Hx    Stomach cancer Neg Hx    Rectal cancer Neg Hx     Social History   Socioeconomic History   Marital status: Legally Separated    Spouse name: Not on file   Number of children: 2   Years of education: 12   Highest education level: 12th grade  Occupational History   Occupation: [employer]  Tobacco Use   Smoking status: Some Days    Packs/day: .5    Types: Cigarettes    Passive exposure: Current   Smokeless tobacco: Never  Vaping Use   Vaping Use: Some days  Substance and Sexual Activity   Alcohol use: Yes    Comment: wine a few time a year   Drug use: No   Sexual activity: Not Currently  Other Topics Concern   Not on file  Social History Narrative   Right-handed.   Caffeine use: 5 cups per day.   Living with her daughter right now.   Social Determinants of Health   Financial Resource Strain: Low Risk  (07/01/2021)   Overall Financial Resource Strain (CARDIA)    Difficulty of Paying Living Expenses: Not hard at all  Food Insecurity: No Food Insecurity (07/01/2021)   Hunger Vital Sign    Worried About Running Out of Food in the Last Year: Never true    Ran Out of Food in the Last Year: Never true  Transportation Needs: No Transportation Needs (07/01/2021)   PRAPARE - Hydrologist (Medical): No    Lack of Transportation (Non-Medical): No  Physical Activity: Inactive (07/01/2021)   Exercise Vital Sign    Days of Exercise per Week: 0 days    Minutes of Exercise per Session: 0 min  Stress: Stress Concern Present (07/01/2021)   [place]    Feeling of Stress : Rather much  Social Connections: Socially Isolated (07/01/2021)   Social Connection and Isolation  Panel [NHANES]    Frequency of Communication with Friends and Family: More than three times a week    Frequency of Social Gatherings with Friends and Family: More than three times a week    Attends Religious Services: Never    Marine scientist or Organizations: No    Attends Archivist Meetings: Never    Marital Status: Separated  Intimate Partner Violence: Not At Risk (07/01/2021)   Humiliation, Afraid, Rape, and Kick questionnaire    Fear of Current or Ex-Partner: No    Emotionally Abused: No    Physically Abused: No    Sexually Abused: No    Review of Systems: All other review of systems negative except as mentioned in the HPI.  Physical Exam: Vital signs BP (!) 115/58   Pulse 88   Temp 98.7 F (37.1 C)   Ht 5\' 4"  (1.626 m)   Wt 209 lb (94.8 kg)   SpO2 95%   BMI 35.87 kg/m   General:   Alert,  Well-developed, pleasant and cooperative in NAD Lungs:  Clear throughout to auscultation.   Heart:  Regular rate and rhythm Abdomen:  Soft, nontender and nondistended.   Neuro/Psych:  Alert and cooperative. Normal mood and affect. A and O x 3  Jolly Mango, MD [facility]

## 2022-10-19 NOTE — Progress Notes (Signed)
Called to room to assist during endoscopic procedure.  Patient ID and intended procedure confirmed with present staff. Received instructions for my participation in the procedure from the performing physician.  

## 2022-10-19 NOTE — Patient Instructions (Signed)
   RESUME PLAVIX TODAY  Continue present medications & usual diet   Await pathology results on biopsies done   Handout on hemorrhoids given to you today  Try trial of a fiber supplement such as Citrucel to provide regularity    YOU HAD AN ENDOSCOPIC PROCEDURE TODAY AT [place]:   Refer to the procedure report that was given to you for any specific questions about what was found during the examination.  If the procedure report does not answer your questions, please call your gastroenterologist to clarify.  If you requested that your care partner not be given the details of your procedure findings, then the procedure report has been included in a sealed envelope for you to review at your convenience later.  YOU SHOULD EXPECT: Some feelings of bloating in the abdomen. Passage of more gas than usual.  Walking can help get rid of the air that was put into your GI tract during the procedure and reduce the bloating. If you had a lower endoscopy (such as a colonoscopy or flexible sigmoidoscopy) you may notice spotting of blood in your stool or on the toilet paper. If you underwent a bowel prep for your procedure, you may not have a normal bowel movement for a few days.  Please Note:  You might notice some irritation and congestion in your nose or some drainage.  This is from the oxygen used during your procedure.  There is no need for concern and it should clear up in a day or so.  SYMPTOMS TO REPORT IMMEDIATELY:  Following lower endoscopy (colonoscopy or flexible sigmoidoscopy):  Excessive amounts of blood in the stool  Significant tenderness or worsening of abdominal pains  Swelling of the abdomen that is new, acute  Fever of 100F or higher   For urgent or emergent issues, a gastroenterologist can be reached at any hour by calling [phone]. Do not use MyChart messaging for urgent concerns.    DIET:  We do recommend a small meal at first, but then you may proceed  to your regular diet.  Drink plenty of fluids but you should avoid alcoholic beverages for 24 hours.  ACTIVITY:  You should plan to take it easy for the rest of today and you should NOT DRIVE or use heavy machinery until tomorrow (because of the sedation medicines used during the test).    FOLLOW UP: Our staff will call the number listed on your records the next business day following your procedure.  We will call around 7:15- 8:00 am to check on you and address any questions or concerns that you may have regarding the information given to you following your procedure. If we do not reach you, we will leave a message.     If any biopsies were taken you will be contacted by phone or by letter within the next 1-3 weeks.  Please call us at [phone] if you have not heard about the biopsies in 3 weeks.    SIGNATURES/CONFIDENTIALITY: You and/or your care partner have signed paperwork which will be entered into your electronic medical record.  These signatures attest to the fact that that the information above on your After Visit Summary has been reviewed and is understood.  Full responsibility of the confidentiality of this discharge information lies with you and/or your care-partner.

## 2022-10-19 NOTE — Op Note (Addendum)
[place] Patient Name: Jocelyn [name] Procedure Date: 10/19/2022 10:30 AM MRN: [id number] Endoscopist: Remo Lipps P. Havery Moros , MD, [id number] Age: 62 Referring MD:  Date of Birth: [birth year] Gender: Female Account #: [id number] Procedure:                Colonoscopy Indications:              Change in bowel habits - chronic constipation and                            then loose stools since hospitalization in                            February, negative for C diff. Symptoms improving                            since seen in the clinic. Father had colon cancer                            dx at age 53s Medicines:                Monitored Anesthesia Care Procedure:                Pre-Anesthesia Assessment:                           - Prior to the procedure, a History and Physical                            was performed, and patient medications and                            allergies were reviewed. The patient's tolerance of                            previous anesthesia was also reviewed. The risks                            and benefits of the procedure and the sedation                            options and risks were discussed with the patient.                            All questions were answered, and informed consent                            was obtained. Prior Anticoagulants: The patient has                            taken Plavix (clopidogrel), last dose was 5 days                            prior to procedure. ASA Grade Assessment: III - A  patient with severe systemic disease. After                            reviewing the risks and benefits, the patient was                            deemed in satisfactory condition to undergo the                            procedure.                           After obtaining informed consent, the colonoscope                            was passed under direct vision. Throughout the                             procedure, the patient's blood pressure, pulse, and                            oxygen saturations were monitored continuously. The                            Olympus [id number]) Colonoscope was                            introduced through the anus and advanced to the the                            terminal ileum, with identification of the                            appendiceal orifice and IC valve. The colonoscopy                            was performed without difficulty. The patient                            tolerated the procedure well. The quality of the                            bowel preparation was good. The terminal ileum,                            ileocecal valve, appendiceal orifice, and rectum                            were photographed. Scope In: 10:46:21 AM Scope Out: 11:01:38 AM Scope Withdrawal Time: 0 hours 11 minutes 19 seconds  Total Procedure Duration: 0 hours 15 minutes 17 seconds  Findings:                 The perianal and digital rectal examinations were  normal.                           The terminal ileum appeared normal.                           Internal hemorrhoids were found during                            retroflexion. The hemorrhoids were small.                           There were some benign hyperplastic polyps in the                            distal left colon. The exam was otherwise without                            abnormality.                           Biopsies for histology were taken with a cold                            forceps from the right colon, left colon and                            transverse colon for evaluation of microscopic                            colitis. Complications:            No immediate complications. Estimated blood loss:                            Minimal. Estimated Blood Loss:     Estimated blood loss was minimal. Impression:               - The examined portion of  the ileum was normal.                           - Internal hemorrhoids.                           - Benign left sided hyperplastic flat polyps.                           - The examination was otherwise normal.                           - Biopsies were taken with a cold forceps from the                            right colon, left colon and transverse colon for                            evaluation of microscopic  colitis.                           - The GI Genius (intelligent endoscopy module),                            computer-aided polyp detection system powered by AI                            was utilized to detect colorectal polyps through                            enhanced visualization during colonoscopy. Recommendation:           - Patient has a contact number available for                            emergencies. The signs and symptoms of potential                            delayed complications were discussed with the                            patient. Return to normal activities tomorrow.                            Written discharge instructions were provided to the                            patient.                           - Resume previous diet.                           - Continue present medications.                           - Resume Plavix today                           - Await pathology results with further                            recommendations. Given clinical improvement may                            monitor for now, consider trial of daily fiber                            supplement such as Citrucel to provide regularity Batya Citron P. Tilly Pernice, MD 10/19/2022 11:07:16 AM This report has been signed electronically.

## 2022-10-19 NOTE — Progress Notes (Signed)
Vss nad trans to pacu 

## 2022-10-20 ENCOUNTER — Telehealth: Payer: Self-pay

## 2022-10-20 NOTE — Telephone Encounter (Signed)
  Follow up Call-     10/19/2022    9:39 AM  Call back number  Post procedure Call Back phone  # [phone]  Permission to leave phone message Yes     Patient questions:  Do you have a fever, pain , or abdominal swelling? No. Pain Score  0 *  Have you tolerated food without any problems? Yes.    Have you been able to return to your normal activities? Yes.    Do you have any questions about your discharge instructions: Diet   No. Medications  No. Follow up visit  No.  Do you have questions or concerns about your Care? No.  Actions: * If pain score is 4 or above: No action needed, pain <4.

## 2022-10-27 DIAGNOSIS — E785 Hyperlipidemia, unspecified: Secondary | ICD-10-CM | POA: Diagnosis not present

## 2022-10-27 DIAGNOSIS — E039 Hypothyroidism, unspecified: Secondary | ICD-10-CM | POA: Diagnosis not present

## 2022-10-27 DIAGNOSIS — N179 Acute kidney failure, unspecified: Secondary | ICD-10-CM | POA: Diagnosis not present

## 2022-10-27 DIAGNOSIS — I10 Essential (primary) hypertension: Secondary | ICD-10-CM | POA: Diagnosis not present

## 2022-10-27 DIAGNOSIS — J441 Chronic obstructive pulmonary disease with (acute) exacerbation: Secondary | ICD-10-CM | POA: Diagnosis not present

## 2022-10-27 DIAGNOSIS — I2510 Atherosclerotic heart disease of native coronary artery without angina pectoris: Secondary | ICD-10-CM | POA: Diagnosis not present

## 2022-10-28 ENCOUNTER — Encounter: Payer: Self-pay | Admitting: Gastroenterology

## 2022-10-28 DIAGNOSIS — J441 Chronic obstructive pulmonary disease with (acute) exacerbation: Secondary | ICD-10-CM | POA: Diagnosis not present

## 2022-10-28 DIAGNOSIS — E785 Hyperlipidemia, unspecified: Secondary | ICD-10-CM | POA: Diagnosis not present

## 2022-10-28 DIAGNOSIS — E039 Hypothyroidism, unspecified: Secondary | ICD-10-CM | POA: Diagnosis not present

## 2022-10-28 DIAGNOSIS — I10 Essential (primary) hypertension: Secondary | ICD-10-CM | POA: Diagnosis not present

## 2022-10-31 ENCOUNTER — Ambulatory Visit (INDEPENDENT_AMBULATORY_CARE_PROVIDER_SITE_OTHER): Payer: 59

## 2022-10-31 DIAGNOSIS — I639 Cerebral infarction, unspecified: Secondary | ICD-10-CM | POA: Diagnosis not present

## 2022-11-01 LAB — CUP PACEART REMOTE DEVICE CHECK
Date Time Interrogation Session: 20240331232414
Implantable Pulse Generator Implant Date: 20221110

## 2022-11-01 NOTE — Progress Notes (Signed)
Carelink Summary Report / Loop Recorder 

## 2022-11-02 DIAGNOSIS — H52223 Regular astigmatism, bilateral: Secondary | ICD-10-CM | POA: Diagnosis not present

## 2022-11-07 ENCOUNTER — Ambulatory Visit: Payer: Medicare Other

## 2022-11-10 DIAGNOSIS — E785 Hyperlipidemia, unspecified: Secondary | ICD-10-CM | POA: Diagnosis not present

## 2022-11-10 DIAGNOSIS — E039 Hypothyroidism, unspecified: Secondary | ICD-10-CM | POA: Diagnosis not present

## 2022-11-10 DIAGNOSIS — J441 Chronic obstructive pulmonary disease with (acute) exacerbation: Secondary | ICD-10-CM | POA: Diagnosis not present

## 2022-11-10 DIAGNOSIS — I2510 Atherosclerotic heart disease of native coronary artery without angina pectoris: Secondary | ICD-10-CM | POA: Diagnosis not present

## 2022-11-10 DIAGNOSIS — I10 Essential (primary) hypertension: Secondary | ICD-10-CM | POA: Diagnosis not present

## 2022-12-01 LAB — CUP PACEART REMOTE DEVICE CHECK
Date Time Interrogation Session: 20240501231152
Implantable Pulse Generator Implant Date: 20221110

## 2022-12-05 ENCOUNTER — Ambulatory Visit (INDEPENDENT_AMBULATORY_CARE_PROVIDER_SITE_OTHER): Payer: 59

## 2022-12-05 DIAGNOSIS — I639 Cerebral infarction, unspecified: Secondary | ICD-10-CM

## 2022-12-06 ENCOUNTER — Ambulatory Visit (INDEPENDENT_AMBULATORY_CARE_PROVIDER_SITE_OTHER): Payer: 59

## 2022-12-06 ENCOUNTER — Encounter: Payer: Self-pay | Admitting: Podiatry

## 2022-12-06 ENCOUNTER — Ambulatory Visit (INDEPENDENT_AMBULATORY_CARE_PROVIDER_SITE_OTHER): Payer: 59 | Admitting: Podiatry

## 2022-12-06 DIAGNOSIS — M659 Synovitis and tenosynovitis, unspecified: Secondary | ICD-10-CM | POA: Diagnosis not present

## 2022-12-06 DIAGNOSIS — M778 Other enthesopathies, not elsewhere classified: Secondary | ICD-10-CM | POA: Diagnosis not present

## 2022-12-06 DIAGNOSIS — M21621 Bunionette of right foot: Secondary | ICD-10-CM

## 2022-12-06 MED ORDER — DEXAMETHASONE SODIUM PHOSPHATE 120 MG/30ML IJ SOLN
2.0000 mg | Freq: Once | INTRAMUSCULAR | Status: AC
Start: 2022-12-06 — End: 2022-12-06
  Administered 2022-12-06: 2 mg via INTRA_ARTICULAR

## 2022-12-06 NOTE — Progress Notes (Unsigned)
Subjective:  Patient ID: Jocelyn [name], female    DOB: August 18, 1960,  MRN: [id number] HPI Chief Complaint  Patient presents with   Foot Pain    1st MPJ right - was throbbing a few days ago, but better today, wanted it checked anyways since it comes and goes, achy with shoes and walking some, PCP rx'd gout meds, but she doesn't think its gout so didn't take it   New Patient (Initial Visit)    62 y.o. female presents with the above complaint.   ROS: Denies fever chills nausea pain chest pain shortness of breath.  Past Medical History:  Diagnosis Date   Anxiety    Arthritis    Chronic diastolic CHF (congestive heart failure), NYHA class 2 (HCC)    COPD (chronic obstructive pulmonary disease) (HCC)    Depression    HTN (hypertension)    Hypercholesterolemia    Hypothyroidism (acquired)    Kidney stones 2015   Myocardial infarction [facility])    x3- pt unsure if MIs were in 1996 or 1999, had stent fixed in 2016 but no MI at that time per pt   Stroke [facility]) 06/2021   Thyrotoxicosis    TIA (transient ischemic attack)    2013   Past Surgical History:  Procedure Laterality Date   BREAST CYST EXCISION     BUBBLE STUDY  06/10/2021   Procedure: BUBBLE STUDY;  Surgeon: Lewayne Bunting, MD;  Location: [facility] ENDOSCOPY;  Service: Cardiovascular;;   CARDIAC CATHETERIZATION N/A 08/24/2015   Procedure: Left Heart Cath and Coronary Angiography;  Surgeon: Corky Crafts, MD;  Location: [facility] INVASIVE CV LAB;  Service: Cardiovascular;  Laterality: N/A;   COLONOSCOPY     CORONARY STENT PLACEMENT  1999   and then had procedure to repair in 2016   KNEE SURGERY     LOOP RECORDER INSERTION N/A 06/10/2021   Procedure: LOOP RECORDER INSERTION;  Surgeon: Hillis Range, MD;  Location: [facility] INVASIVE CV LAB;  Service: Cardiovascular;  Laterality: N/A;   partial hysterec     TEE WITHOUT CARDIOVERSION N/A 06/10/2021   Procedure: TRANSESOPHAGEAL ECHOCARDIOGRAM (TEE);  Surgeon: Lewayne Bunting, MD;   Location: [facility] ENDOSCOPY;  Service: Cardiovascular;  Laterality: N/A;   THYROIDECTOMY      Current Outpatient Medications:    Colchicine 0.6 MG CAPS, Take by mouth., Disp: , Rfl:    diclofenac Sodium (VOLTAREN) 1 % GEL, SMARTSIG:Gram(s) Topical 4 Times Daily, Disp: , Rfl:    ibuprofen (ADVIL) 800 MG tablet, Take 800 mg by mouth 3 (three) times daily., Disp: , Rfl:    albuterol (PROVENTIL HFA;VENTOLIN HFA) 108 (90 Base) MCG/ACT inhaler, Inhale 2 puffs into the lungs every 6 (six) hours as needed for wheezing or shortness of breath., Disp: , Rfl:    albuterol (PROVENTIL) (2.5 MG/3ML) 0.083% nebulizer solution, Take 3 mLs (2.5 mg total) by nebulization every 4 (four) hours as needed for wheezing or shortness of breath., Disp: 75 mL, Rfl: 0   ALPRAZolam (XANAX) 1 MG tablet, Take 1 mg by mouth daily., Disp: , Rfl:    aspirin EC 81 MG tablet, Take 81 mg by mouth daily. Swallow whole., Disp: , Rfl:    clopidogrel (PLAVIX) 75 MG tablet, Take 75 mg by mouth daily., Disp: , Rfl:    cyclobenzaprine (FLEXERIL) 10 MG tablet, Take by mouth 3 (three) times daily as needed., Disp: , Rfl:    fluconazole (DIFLUCAN) 150 MG tablet, take 1 tablet (150mg ) by mouth as needed for  yeast infection, may repeat if needed after 72 hours (Patient not taking: Reported on 09/26/2022), Disp: 2 tablet, Rfl: 0   furosemide (LASIX) 40 MG tablet, Take 40 mg by mouth daily., Disp: , Rfl:    gabapentin (NEURONTIN) 300 MG capsule, Take 300 mg by mouth at bedtime as needed (pain)., Disp: , Rfl:    ipratropium-albuterol (DUONEB) 0.5-2.5 (3) MG/3ML SOLN, Take 3 mLs by nebulization every 6 (six) hours as needed. (Patient taking differently: Take 3 mLs by nebulization every 6 (six) hours as needed (shortness of breath, wheezing).), Disp: 360 mL, Rfl: 1   isosorbide mononitrate (IMDUR) 30 MG 24 hr tablet, Take 30 mg by mouth daily. (Patient not taking: Reported on 10/19/2022), Disp: , Rfl:    levothyroxine (SYNTHROID) 100 MCG tablet, Take 100 mcg  by mouth daily., Disp: , Rfl:    MELATONIN PO, Take 10-20 mg by mouth at bedtime as needed (sleep)., Disp: , Rfl:    Methylsulfonylmethane (MSM) 1000 MG TABS, Take 1,000 mg by mouth daily., Disp: , Rfl:    montelukast (SINGULAIR) 10 MG tablet, Take 10 mg by mouth daily., Disp: , Rfl:    nitroGLYCERIN (NITROSTAT) 0.3 MG SL tablet, Place 0.3 mg under the tongue every 5 (five) minutes as needed for chest pain., Disp: , Rfl: 0   potassium chloride (KLOR-CON M) 10 MEQ tablet, Take by mouth., Disp: , Rfl:    rosuvastatin (CRESTOR) 20 MG tablet, Take 20 mg by mouth daily., Disp: , Rfl:    SYMBICORT 160-4.5 MCG/ACT inhaler, Inhale 2 puffs into the lungs 2 (two) times daily., Disp: , Rfl:    triamcinolone cream (KENALOG) 0.5 %, Apply 1 application topically daily as needed (for sun exposure)., Disp: , Rfl:   Allergies  Allergen Reactions   Hydrocodone Itching    Has problems with high doses   Review of Systems Objective:  There were no vitals filed for this visit.  General: Well developed, nourished, in no acute distress, alert and oriented x3   Dermatological: Skin is warm, dry and supple bilateral. Nails x 10 are well maintained; remaining integument appears unremarkable at this time. There are no open sores, no preulcerative lesions, no rash or signs of infection present.  Vascular: Dorsalis Pedis artery and Posterior Tibial artery pedal pulses are 2/4 bilateral with immedate capillary fill time. Pedal hair growth present. No varicosities and no lower extremity edema present bilateral.   Neruologic: Grossly intact via light touch bilateral. Vibratory intact via tuning fork bilateral. Protective threshold with Semmes Wienstein monofilament intact to all pedal sites bilateral. Patellar and Achilles deep tendon reflexes 2+ bilateral. No Babinski or clonus noted bilateral.   Musculoskeletal: No gross boney pedal deformities bilateral. No pain, crepitus, or limitation noted with foot and ankle range  of motion bilateral. Muscular strength 5/5 in all groups tested bilateral.  Small palpable spurs around the first metatarsophalangeal joint not visible on radiographs.  She has nice open joint full range of motion without crepitation no pain in range of motion.  Gait: Unassisted, Nonantalgic.    Radiographs:  Radiographs taken today do not demonstrate any type of osseous abnormalities good mineralization of the bone no acute findings in the area in question.  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  Cannot rule out gout.  Plan: I injected the joint today with 2 mg of dexamethasone local anesthetic she will call with questions or concerns or worsening of the condition.  Garrel Ridgel, DPM

## 2022-12-08 NOTE — Progress Notes (Signed)
Carelink Summary Report / Loop Recorder 

## 2023-01-03 NOTE — Progress Notes (Signed)
Carelink Summary Report / Loop Recorder 

## 2023-01-04 LAB — CUP PACEART REMOTE DEVICE CHECK
Date Time Interrogation Session: 20240601230636
Implantable Pulse Generator Implant Date: 20221110

## 2023-01-09 ENCOUNTER — Ambulatory Visit (INDEPENDENT_AMBULATORY_CARE_PROVIDER_SITE_OTHER): Payer: 59

## 2023-01-09 DIAGNOSIS — I639 Cerebral infarction, unspecified: Secondary | ICD-10-CM | POA: Diagnosis not present

## 2023-01-31 ENCOUNTER — Ambulatory Visit (INDEPENDENT_AMBULATORY_CARE_PROVIDER_SITE_OTHER): Payer: 59

## 2023-01-31 DIAGNOSIS — I639 Cerebral infarction, unspecified: Secondary | ICD-10-CM

## 2023-01-31 NOTE — Progress Notes (Signed)
Carelink Summary Report / Loop Recorder 

## 2023-02-01 LAB — CUP PACEART REMOTE DEVICE CHECK
Date Time Interrogation Session: 20240702231346
Implantable Pulse Generator Implant Date: 20221110

## 2023-02-13 ENCOUNTER — Ambulatory Visit: Payer: 59

## 2023-02-15 DIAGNOSIS — R7303 Prediabetes: Secondary | ICD-10-CM | POA: Diagnosis not present

## 2023-02-15 DIAGNOSIS — E785 Hyperlipidemia, unspecified: Secondary | ICD-10-CM | POA: Diagnosis not present

## 2023-02-15 DIAGNOSIS — J441 Chronic obstructive pulmonary disease with (acute) exacerbation: Secondary | ICD-10-CM | POA: Diagnosis not present

## 2023-02-15 DIAGNOSIS — E039 Hypothyroidism, unspecified: Secondary | ICD-10-CM | POA: Diagnosis not present

## 2023-02-15 DIAGNOSIS — Z0001 Encounter for general adult medical examination with abnormal findings: Secondary | ICD-10-CM | POA: Diagnosis not present

## 2023-02-15 DIAGNOSIS — I10 Essential (primary) hypertension: Secondary | ICD-10-CM | POA: Diagnosis not present

## 2023-02-15 DIAGNOSIS — E559 Vitamin D deficiency, unspecified: Secondary | ICD-10-CM | POA: Diagnosis not present

## 2023-02-15 DIAGNOSIS — I2510 Atherosclerotic heart disease of native coronary artery without angina pectoris: Secondary | ICD-10-CM | POA: Diagnosis not present

## 2023-02-16 ENCOUNTER — Other Ambulatory Visit: Payer: Self-pay | Admitting: Internal Medicine

## 2023-02-16 DIAGNOSIS — Z1231 Encounter for screening mammogram for malignant neoplasm of breast: Secondary | ICD-10-CM

## 2023-02-24 NOTE — Progress Notes (Signed)
Carelink Summary Report / Loop Recorder 

## 2023-03-06 ENCOUNTER — Ambulatory Visit: Payer: 59 | Admitting: Neurology

## 2023-03-06 ENCOUNTER — Ambulatory Visit (INDEPENDENT_AMBULATORY_CARE_PROVIDER_SITE_OTHER): Payer: 59

## 2023-03-06 ENCOUNTER — Encounter: Payer: Self-pay | Admitting: Neurology

## 2023-03-06 VITALS — BP 108/70 | HR 87 | Ht 64.0 in | Wt 210.4 lb

## 2023-03-06 DIAGNOSIS — I639 Cerebral infarction, unspecified: Secondary | ICD-10-CM | POA: Diagnosis not present

## 2023-03-06 DIAGNOSIS — G3184 Mild cognitive impairment, so stated: Secondary | ICD-10-CM | POA: Diagnosis not present

## 2023-03-06 DIAGNOSIS — Z8673 Personal history of transient ischemic attack (TIA), and cerebral infarction without residual deficits: Secondary | ICD-10-CM | POA: Diagnosis not present

## 2023-03-06 NOTE — Progress Notes (Addendum)
[facility] [address] [place]. [place] [address] [phone]       OFFICE FOLLOW-UP NOTE  [name] Date of Birth:  [birth year] Medical Record Number:  [id number]   HPI: Initial visit 08/26/2021 [name] is a 62 year old pleasant African-American lady seen today for initial office follow-up visit following hospital admission for stroke in November 2022.  She is accompanied by her daughter and granddaughter.  History is obtained from them and review of electronic medical records and I have personally reviewed available pertinent imaging films and PACS.  She is a 62 year old lady with past medical history of hypertension, hyperlipidemia, hypothyroidism, coronary artery disease, prior LV thrombus was on anticoagulation for a short time, COPD remote smoking quit a month ago, anxiety, depression.  Patient presented on 06/08/2021 with sudden onset of speech difficulties which did not make sense.  She was brought in as a code stroke.  NIH stroke scale was 3 on admission initial head CT was negative for acute abnormalities.  CT angiogram of the brain and neck did not show any large vessel stenosis or occlusion.  MRI scan showed a small left temporoparietal cortical embolic infarct.  Hemoglobin A1c was 6.2.  LDL cholesterol is 1 1 2  mg percent.  Transthoracic echo showed resolution of the previous LV thrombus and no new clot or any other cardiac source of embolism.  Patient had a loop recorder placed and so far paroxysmal A. fib has not yet been found.  Speech symptoms resolved completely.  She states she is done well.  Only when she is tired towards the end of the day occasionally she may struggle with some words have some word finding difficulties.  She has finished outpatient speech therapy.  She has been placed by her cardiologist on aspirin and Plavix dual antiplatelet therapy which is tolerating well without any bruising or bleeding.  She is on Crestor and tolerating it well  without muscle aches and pains.  Her blood pressure is under good control today it is 114/74.  She has no new complaints today. Update 02/23/2022: She returns for follow-up after last visit 6 months ago.  She states she is doing well.  She has had no recurrent stroke or TIA symptoms.  Her speech is almost back to normal but rarely she has occasional word finding difficulties when she tries to talk fast or gets excited.  She knows that she needs to slow down while talking.  She remains on aspirin and Plavix but does complain of increasing bruising.  She did have remote history of cardiac stent in 2017 but it was 100% occluded.  She has had no recent cardiac symptoms or procedures.  She is concerned a little bit about her decreased hearing but she has not yet discussed this with her primary care physician.  She complains of mild posterior neck pain and does complain of some tightness in the neck and shoulder muscles.  She has not been doing any regular neck stretching exercises or any stress relaxation activities.  Her blood pressures under good control today it is 100/67.  She has not yet had lipid profile check a carotid ultrasound checked this year. Update 03/06/2023 : She returns for follow-up after last visit a year ago.  She continues to do well from stroke standpoint without recurrent stroke or TIA symptoms.  She remains on aspirin and Plavix tolerating well without bruising or bleeding.  She continues to have occasional word finding difficulties but does also complain of mild  short-term memory difficulties as well as difficulty with concentration and attention.  Loop recorder interrogation on 03/03/2023 did not show evidence of paroxysmal A-fib yet.  Follow-up carotid ultrasound on 03/11/2022 showed no significant extracranial stenosis.  An A1c on 09/03/2022 was 6.1.  She remains on Crestor which she is tolerating well without any side effects.  Last lipid profile on 02/23/2022 was satisfactory with LDL borderline at  79 mg percent.  Patient has been complaining of cognitive difficulties with particular short-term memory.  She has had trouble with attention and concentration.  She has noticed this all since her stroke.  Has not been getting better.  She denies feeling depressed.  There is no family history of Alzheimer's. ROS:   14 system review of systems is positive for speech difficulties, word finding difficulties, bruising all other systems negative  PMH:  Past Medical History:  Diagnosis Date   Anxiety    Arthritis    Chronic diastolic CHF (congestive heart failure), NYHA class 2 (HCC)    COPD (chronic obstructive pulmonary disease) (HCC)    Depression    HTN (hypertension)    Hypercholesterolemia    Hypothyroidism (acquired)    Kidney stones 2015   Myocardial infarction (HCC)    x3- pt unsure if MIs were in 1996 or 1999, had stent fixed in 2016 but no MI at that time per pt   Stroke [facility]) 06/2021   Thyrotoxicosis    TIA (transient ischemic attack)    2013    Social History:  Social History   Socioeconomic History   Marital status: Legally Separated    Spouse name: Not on file   Number of children: 2   Years of education: 12   Highest education level: 12th grade  Occupational History   Occupation: [employer]  Tobacco Use   Smoking status: Some Days    Current packs/day: 0.50    Types: Cigarettes    Passive exposure: Current   Smokeless tobacco: Never  Vaping Use   Vaping status: Some Days  Substance and Sexual Activity   Alcohol use: Yes    Comment: wine a few time a year   Drug use: No   Sexual activity: Not Currently  Other Topics Concern   Not on file  Social History Narrative   Right-handed.   Caffeine use: 5 cups per day.   Living with her daughter right now.   Social Determinants of Health   Financial Resource Strain: Low Risk  (07/01/2021)   Overall Financial Resource Strain (CARDIA)    Difficulty of Paying Living Expenses: Not hard at all  Food Insecurity: No Food  Insecurity (07/01/2021)   Hunger Vital Sign    Worried About Running Out of Food in the Last Year: Never true    Ran Out of Food in the Last Year: Never true  Transportation Needs: No Transportation Needs (07/01/2021)   PRAPARE - Administrator, Civil Service (Medical): No    Lack of Transportation (Non-Medical): No  Physical Activity: Inactive (07/01/2021)   Exercise Vital Sign    Days of Exercise per Week: 0 days    Minutes of Exercise per Session: 0 min  Stress: Stress Concern Present (07/01/2021)   [employer] of Occupational Health - Occupational Stress Questionnaire    Feeling of Stress : Rather much  Social Connections: Socially Isolated (07/01/2021)   Social Connection and Isolation Panel [NHANES]    Frequency of Communication with Friends and Family: More than three times a week  Frequency of Social Gatherings with Friends and Family: More than three times a week    Attends Religious Services: Never    Database administrator or Organizations: No    Attends Banker Meetings: Never    Marital Status: Separated  Intimate Partner Violence: Not At Risk (07/01/2021)   Humiliation, Afraid, Rape, and Kick questionnaire    Fear of Current or Ex-Partner: No    Emotionally Abused: No    Physically Abused: No    Sexually Abused: No    Medications:   Current Outpatient Medications on File Prior to Visit  Medication Sig Dispense Refill   albuterol (PROVENTIL HFA;VENTOLIN HFA) 108 (90 Base) MCG/ACT inhaler Inhale 2 puffs into the lungs every 6 (six) hours as needed for wheezing or shortness of breath.     albuterol (PROVENTIL) (2.5 MG/3ML) 0.083% nebulizer solution Take 3 mLs (2.5 mg total) by nebulization every 4 (four) hours as needed for wheezing or shortness of breath. 75 mL 0   ALPRAZolam (XANAX) 1 MG tablet Take 1 mg by mouth daily.     aspirin EC 81 MG tablet Take 81 mg by mouth daily. Swallow whole.     clopidogrel (PLAVIX) 75 MG tablet Take 75 mg by  mouth daily.     cyclobenzaprine (FLEXERIL) 10 MG tablet Take by mouth 3 (three) times daily as needed.     furosemide (LASIX) 40 MG tablet Take 40 mg by mouth daily.     gabapentin (NEURONTIN) 300 MG capsule Take 300 mg by mouth at bedtime as needed (pain).     ibuprofen (ADVIL) 800 MG tablet Take 800 mg by mouth daily as needed.     levothyroxine (SYNTHROID) 100 MCG tablet Take 100 mcg by mouth daily.     MELATONIN PO Take 10-20 mg by mouth at bedtime as needed (sleep).     Methylsulfonylmethane (MSM) 1000 MG TABS Take 1,000 mg by mouth daily.     montelukast (SINGULAIR) 10 MG tablet Take 10 mg by mouth daily.     SYMBICORT 160-4.5 MCG/ACT inhaler Inhale 2 puffs into the lungs 2 (two) times daily.     triamcinolone cream (KENALOG) 0.5 % Apply 1 application topically daily as needed (for sun exposure).     diclofenac Sodium (VOLTAREN) 1 % GEL SMARTSIG:Gram(s) Topical 4 Times Daily (Patient not taking: Reported on 03/06/2023)     ipratropium-albuterol (DUONEB) 0.5-2.5 (3) MG/3ML SOLN Take 3 mLs by nebulization every 6 (six) hours as needed. (Patient taking differently: Take 3 mLs by nebulization every 6 (six) hours as needed (shortness of breath, wheezing).) 360 mL 1   rosuvastatin (CRESTOR) 20 MG tablet Take 20 mg by mouth daily. (Patient not taking: Reported on 03/06/2023)     No current facility-administered medications on file prior to visit.    Allergies:   Allergies  Allergen Reactions   Hydrocodone Itching    Has problems with high doses    Physical Exam General: Mildly obese middle-aged African-American lady seated, in no evident distress Head: head normocephalic and atraumatic.  Neck: supple with no carotid or supraclavicular bruits Cardiovascular: regular rate and rhythm, no murmurs Musculoskeletal: no deformity Skin:  no rash/petichiae Vascular:  Normal pulses all extremities Vitals:   03/06/23 1430  BP: 108/70  Pulse: 87   Neurologic Exam Mental Status: Awake and fully  alert. Oriented to place and time. Recent and remote memory intact. Attention span, concentration and fund of knowledge appropriate. Mood and affect appropriate.  Clock drawing 4/4.  On animal naming test scored 10..  Geriatric depression scale scored 6 not depressed.  Mini-Mental status exam score 29/30. Cranial Nerves: Fundoscopic exam not done. Pupils equal, briskly reactive to light. Extraocular movements full without nystagmus. Visual fields full to confrontation. Hearing intact. Facial sensation intact. Face, tongue, palate moves normally and symmetrically.  Motor: Normal bulk and tone. Normal strength in all tested extremity muscles. Sensory.: intact to touch ,pinprick .position and vibratory sensation.  Coordination: Rapid alternating movements normal in all extremities. Finger-to-nose and heel-to-shin performed accurately bilaterally. Gait and Station: Arises from chair without difficulty. Stance is normal. Gait demonstrates normal stride length and balance . Able to heel, toe and tandem walk with mild  difficulty.  Reflexes: 1+ and symmetric. Toes downgoing.   NIHSS  0 Modified Rankin  1     No data to display             ASSESSMENT: 62 year old African-American lady with embolic left MCA branch infarcts in November 2022 of cryptogenic etiology.  She is doing extremely well with only occasional word finding difficulties.  Vascular risk factors of cardiomyopathy, hypertension and hyperlipidemia.  Mild posterior neck pain likely musculoskeletal due to muscle tension.  She also has mild poststroke cognitive impairment.     PLAN: I had a long d/w patient about her remote stroke, risk for recurrent stroke/TIAs, personally independently reviewed imaging studies and stroke evaluation results and answered questions.Continue Plavix  and maintain strict control of hypertension with blood pressure goal below 130/90, diabetes with hemoglobin A1c goal below 6.5% and lipids with LDL cholesterol  goal below 70 mg/dL. I also advised the patient to eat a healthy diet with plenty of whole grains, cereals, fruits and vegetables, exercise regularly and maintain ideal body weight .  I encouraged him to increase participation in cognitively challenging activities like solving crossword puzzles, playing bridge and sudoku.  We also discussed memory compensation strategies.  She will return for follow-up in the future only as needed and no scheduled appointment was made. Greater than 50% of time during this 35 minute visit was spent on counseling,explanation of diagnosis of stroke and neck pain, planning of further management, discussion with patient and family and coordination of care Delia Heady, MD Note: This document was prepared with digital dictation and possible smart phrase technology. Any transcriptional errors that result from this process are unintentional

## 2023-03-06 NOTE — Patient Instructions (Addendum)
I had a long d/w patient about her remote stroke, risk for recurrent stroke/TIAs, personally independently reviewed imaging studies and stroke evaluation results and answered questions.Continue Plavix  and maintain strict control of hypertension with blood pressure goal below 130/90, diabetes with hemoglobin A1c goal below 6.5% and lipids with LDL cholesterol goal below 70 mg/dL. I also advised the patient to eat a healthy diet with plenty of whole grains, cereals, fruits and vegetables, exercise regularly and maintain ideal body weight .  I encouraged him to increase participation in cognitively challenging activities like solving crossword puzzles, playing bridge and sudoku.  We also discussed memory compensation strategies.  She will return for follow-up in the future only as needed and no scheduled appointment was made.  Memory Compensation Strategies  Use "WARM" strategy.  W= write it down  A= associate it  R= repeat it  M= make a mental note  2.   You can keep a Glass blower/designer.  Use a 3-ring notebook with sections for the following: calendar, important names and phone numbers,  medications, doctors' names/phone numbers, lists/reminders, and a section to journal what you did  each day.   3.    Use a calendar to write appointments down.  4.    Write yourself a schedule for the day.  This can be placed on the calendar or in a separate section of the Memory Notebook.  Keeping a  regular schedule can help memory.  5.    Use medication organizer with sections for each day or morning/evening pills.  You may need help loading it  6.    Keep a basket, or pegboard by the door.  Place items that you need to take out with you in the basket or on the pegboard.  You may also want to  include a message board for reminders.  7.    Use sticky notes.  Place sticky notes with reminders in a place where the task is performed.  For example: " turn off the  stove" placed by the stove, "lock the door"  placed on the door at eye level, " take your medications" on  the bathroom mirror or by the place where you normally take your medications.  8.    Use alarms/timers.  Use while cooking to remind yourself to check on food or as a reminder to take your medicine, or as a  reminder to make a call, or as a reminder to perform another task, etc.

## 2023-03-07 ENCOUNTER — Other Ambulatory Visit: Payer: Self-pay | Admitting: Internal Medicine

## 2023-03-07 ENCOUNTER — Ambulatory Visit
Admission: RE | Admit: 2023-03-07 | Discharge: 2023-03-07 | Disposition: A | Discharge status: Home / Self Care | Payer: 59 | Source: Ambulatory Visit | Attending: Internal Medicine | Admitting: Internal Medicine

## 2023-03-07 DIAGNOSIS — N63 Unspecified lump in unspecified breast: Secondary | ICD-10-CM

## 2023-03-07 DIAGNOSIS — Z1231 Encounter for screening mammogram for malignant neoplasm of breast: Secondary | ICD-10-CM

## 2023-03-08 DIAGNOSIS — N644 Mastodynia: Secondary | ICD-10-CM | POA: Diagnosis not present

## 2023-03-20 ENCOUNTER — Ambulatory Visit: Payer: 59

## 2023-03-20 NOTE — Progress Notes (Signed)
Carelink Summary Report / Loop Recorder 

## 2023-04-04 LAB — CUP PACEART REMOTE DEVICE CHECK
Date Time Interrogation Session: 20240902231346
Implantable Pulse Generator Implant Date: 20221110

## 2023-04-05 DIAGNOSIS — J441 Chronic obstructive pulmonary disease with (acute) exacerbation: Secondary | ICD-10-CM | POA: Diagnosis not present

## 2023-04-05 DIAGNOSIS — I2510 Atherosclerotic heart disease of native coronary artery without angina pectoris: Secondary | ICD-10-CM | POA: Diagnosis not present

## 2023-04-05 DIAGNOSIS — I10 Essential (primary) hypertension: Secondary | ICD-10-CM | POA: Diagnosis not present

## 2023-04-05 DIAGNOSIS — R7303 Prediabetes: Secondary | ICD-10-CM | POA: Diagnosis not present

## 2023-04-05 DIAGNOSIS — E039 Hypothyroidism, unspecified: Secondary | ICD-10-CM | POA: Diagnosis not present

## 2023-04-05 DIAGNOSIS — E785 Hyperlipidemia, unspecified: Secondary | ICD-10-CM | POA: Diagnosis not present

## 2023-04-05 DIAGNOSIS — E559 Vitamin D deficiency, unspecified: Secondary | ICD-10-CM | POA: Diagnosis not present

## 2023-04-06 ENCOUNTER — Other Ambulatory Visit: Payer: Self-pay | Admitting: Internal Medicine

## 2023-04-06 ENCOUNTER — Ambulatory Visit
Admission: RE | Admit: 2023-04-06 | Discharge: 2023-04-06 | Disposition: A | Discharge status: Home / Self Care | Payer: 59 | Source: Ambulatory Visit | Attending: Internal Medicine | Admitting: Internal Medicine

## 2023-04-06 DIAGNOSIS — N6320 Unspecified lump in the left breast, unspecified quadrant: Secondary | ICD-10-CM | POA: Diagnosis not present

## 2023-04-06 DIAGNOSIS — N63 Unspecified lump in unspecified breast: Secondary | ICD-10-CM

## 2023-04-06 DIAGNOSIS — N644 Mastodynia: Secondary | ICD-10-CM | POA: Diagnosis not present

## 2023-04-06 DIAGNOSIS — N6310 Unspecified lump in the right breast, unspecified quadrant: Secondary | ICD-10-CM | POA: Diagnosis not present

## 2023-04-10 ENCOUNTER — Ambulatory Visit (INDEPENDENT_AMBULATORY_CARE_PROVIDER_SITE_OTHER): Payer: 59

## 2023-04-10 DIAGNOSIS — I639 Cerebral infarction, unspecified: Secondary | ICD-10-CM | POA: Diagnosis not present

## 2023-04-20 DIAGNOSIS — L659 Nonscarring hair loss, unspecified: Secondary | ICD-10-CM | POA: Diagnosis not present

## 2023-04-20 DIAGNOSIS — J441 Chronic obstructive pulmonary disease with (acute) exacerbation: Secondary | ICD-10-CM | POA: Diagnosis not present

## 2023-04-24 ENCOUNTER — Ambulatory Visit: Payer: 59

## 2023-04-27 NOTE — Progress Notes (Signed)
Carelink Summary Report / Loop Recorder 

## 2023-05-02 DIAGNOSIS — L639 Alopecia areata, unspecified: Secondary | ICD-10-CM | POA: Diagnosis not present

## 2023-05-11 DIAGNOSIS — L639 Alopecia areata, unspecified: Secondary | ICD-10-CM | POA: Diagnosis not present

## 2023-05-15 ENCOUNTER — Ambulatory Visit (INDEPENDENT_AMBULATORY_CARE_PROVIDER_SITE_OTHER): Payer: 59

## 2023-05-15 DIAGNOSIS — I639 Cerebral infarction, unspecified: Secondary | ICD-10-CM | POA: Diagnosis not present

## 2023-05-16 LAB — CUP PACEART REMOTE DEVICE CHECK
Date Time Interrogation Session: 20241013231240
Implantable Pulse Generator Implant Date: 20221110

## 2023-05-29 ENCOUNTER — Ambulatory Visit: Payer: 59

## 2023-05-30 NOTE — Progress Notes (Signed)
Carelink Summary Report / Loop Recorder 

## 2023-06-01 DIAGNOSIS — J441 Chronic obstructive pulmonary disease with (acute) exacerbation: Secondary | ICD-10-CM | POA: Diagnosis not present

## 2023-06-01 DIAGNOSIS — E559 Vitamin D deficiency, unspecified: Secondary | ICD-10-CM | POA: Diagnosis not present

## 2023-06-01 DIAGNOSIS — R7303 Prediabetes: Secondary | ICD-10-CM | POA: Diagnosis not present

## 2023-06-01 DIAGNOSIS — E039 Hypothyroidism, unspecified: Secondary | ICD-10-CM | POA: Diagnosis not present

## 2023-06-01 DIAGNOSIS — I9589 Other hypotension: Secondary | ICD-10-CM | POA: Diagnosis not present

## 2023-06-01 DIAGNOSIS — I5032 Chronic diastolic (congestive) heart failure: Secondary | ICD-10-CM | POA: Diagnosis not present

## 2023-06-01 DIAGNOSIS — E785 Hyperlipidemia, unspecified: Secondary | ICD-10-CM | POA: Diagnosis not present

## 2023-06-01 DIAGNOSIS — I2510 Atherosclerotic heart disease of native coronary artery without angina pectoris: Secondary | ICD-10-CM | POA: Diagnosis not present

## 2023-06-01 DIAGNOSIS — I10 Essential (primary) hypertension: Secondary | ICD-10-CM | POA: Diagnosis not present

## 2023-06-16 DIAGNOSIS — R7303 Prediabetes: Secondary | ICD-10-CM | POA: Diagnosis not present

## 2023-06-16 DIAGNOSIS — J3089 Other allergic rhinitis: Secondary | ICD-10-CM | POA: Diagnosis not present

## 2023-06-16 DIAGNOSIS — I2510 Atherosclerotic heart disease of native coronary artery without angina pectoris: Secondary | ICD-10-CM | POA: Diagnosis not present

## 2023-06-16 DIAGNOSIS — I10 Essential (primary) hypertension: Secondary | ICD-10-CM | POA: Diagnosis not present

## 2023-06-16 DIAGNOSIS — J441 Chronic obstructive pulmonary disease with (acute) exacerbation: Secondary | ICD-10-CM | POA: Diagnosis not present

## 2023-06-16 DIAGNOSIS — E785 Hyperlipidemia, unspecified: Secondary | ICD-10-CM | POA: Diagnosis not present

## 2023-06-19 ENCOUNTER — Ambulatory Visit (INDEPENDENT_AMBULATORY_CARE_PROVIDER_SITE_OTHER): Payer: 59

## 2023-06-19 DIAGNOSIS — I639 Cerebral infarction, unspecified: Secondary | ICD-10-CM

## 2023-06-19 LAB — CUP PACEART REMOTE DEVICE CHECK
Date Time Interrogation Session: 20241117231503
Implantable Pulse Generator Implant Date: 20221110

## 2023-06-26 DIAGNOSIS — L639 Alopecia areata, unspecified: Secondary | ICD-10-CM | POA: Diagnosis not present

## 2023-07-03 ENCOUNTER — Ambulatory Visit: Payer: 59

## 2023-07-13 NOTE — Progress Notes (Signed)
Carelink Summary Report / Loop Recorder 

## 2023-07-24 ENCOUNTER — Ambulatory Visit: Payer: 59

## 2023-07-24 DIAGNOSIS — I639 Cerebral infarction, unspecified: Secondary | ICD-10-CM | POA: Diagnosis not present

## 2023-07-24 LAB — CUP PACEART REMOTE DEVICE CHECK
Date Time Interrogation Session: 20241222231313
Implantable Pulse Generator Implant Date: 20221110

## 2023-08-02 ENCOUNTER — Other Ambulatory Visit: Payer: Self-pay

## 2023-08-02 ENCOUNTER — Emergency Department (HOSPITAL_COMMUNITY)
Admission: EM | Admit: 2023-08-02 | Discharge: 2023-08-03 | Disposition: A | Discharge status: Home / Self Care | Payer: 59 | Attending: Emergency Medicine | Admitting: Emergency Medicine

## 2023-08-02 DIAGNOSIS — Z5321 Procedure and treatment not carried out due to patient leaving prior to being seen by health care provider: Secondary | ICD-10-CM | POA: Insufficient documentation

## 2023-08-02 DIAGNOSIS — R197 Diarrhea, unspecified: Secondary | ICD-10-CM | POA: Insufficient documentation

## 2023-08-02 LAB — COMPREHENSIVE METABOLIC PANEL
ALT: 18 U/L (ref 0–44)
AST: 22 U/L (ref 15–41)
Albumin: 3.3 g/dL — ABNORMAL LOW (ref 3.5–5.0)
Alkaline Phosphatase: 75 U/L (ref 38–126)
Anion gap: 12 (ref 5–15)
BUN: 12 mg/dL (ref 8–23)
CO2: 20 mmol/L — ABNORMAL LOW (ref 22–32)
Calcium: 9.4 mg/dL (ref 8.9–10.3)
Chloride: 104 mmol/L (ref 98–111)
Creatinine, Ser: 1.1 mg/dL — ABNORMAL HIGH (ref 0.44–1.00)
GFR, Estimated: 57 mL/min — ABNORMAL LOW (ref >60)
Glucose, Bld: 112 mg/dL — ABNORMAL HIGH (ref 70–99)
Potassium: 3.7 mmol/L (ref 3.5–5.1)
Sodium: 136 mmol/L (ref 135–145)
Total Bilirubin: 1 mg/dL (ref 0.0–1.2)
Total Protein: 7.2 g/dL (ref 6.5–8.1)

## 2023-08-02 LAB — CBC WITH DIFFERENTIAL/PLATELET
Abs Immature Granulocytes: 0.04 10*3/uL (ref 0.00–0.07)
Basophils Absolute: 0.1 10*3/uL (ref 0.0–0.1)
Basophils Relative: 1 %
Eosinophils Absolute: 0 10*3/uL (ref 0.0–0.5)
Eosinophils Relative: 0 %
HCT: 42.1 % (ref 36.0–46.0)
Hemoglobin: 14.5 g/dL (ref 12.0–15.0)
Immature Granulocytes: 0 %
Lymphocytes Relative: 22 %
Lymphs Abs: 2.7 10*3/uL (ref 0.7–4.0)
MCH: 30.1 pg (ref 26.0–34.0)
MCHC: 34.4 g/dL (ref 30.0–36.0)
MCV: 87.5 fL (ref 80.0–100.0)
Monocytes Absolute: 1.2 10*3/uL — ABNORMAL HIGH (ref 0.1–1.0)
Monocytes Relative: 9 %
Neutro Abs: 8.5 10*3/uL — ABNORMAL HIGH (ref 1.7–7.7)
Neutrophils Relative %: 68 %
Platelets: 217 10*3/uL (ref 150–400)
RBC: 4.81 MIL/uL (ref 3.87–5.11)
RDW: 13.6 % (ref 11.5–15.5)
WBC: 12.4 10*3/uL — ABNORMAL HIGH (ref 4.0–10.5)
nRBC: 0 % (ref 0.0–0.2)

## 2023-08-02 NOTE — ED Triage Notes (Signed)
 Patient reports diarrhea this evening with poor appetite onset yesterday , denies fever or emesis .

## 2023-08-03 ENCOUNTER — Encounter (HOSPITAL_BASED_OUTPATIENT_CLINIC_OR_DEPARTMENT_OTHER): Payer: Self-pay

## 2023-08-03 ENCOUNTER — Observation Stay (HOSPITAL_BASED_OUTPATIENT_CLINIC_OR_DEPARTMENT_OTHER)
Admission: EM | Admit: 2023-08-03 | Discharge: 2023-08-04 | Disposition: A | Discharge status: Home / Self Care | Payer: 59 | Attending: Emergency Medicine | Admitting: Emergency Medicine

## 2023-08-03 ENCOUNTER — Emergency Department (HOSPITAL_BASED_OUTPATIENT_CLINIC_OR_DEPARTMENT_OTHER): Payer: 59

## 2023-08-03 DIAGNOSIS — Z20822 Contact with and (suspected) exposure to covid-19: Secondary | ICD-10-CM | POA: Insufficient documentation

## 2023-08-03 DIAGNOSIS — R918 Other nonspecific abnormal finding of lung field: Secondary | ICD-10-CM | POA: Diagnosis not present

## 2023-08-03 DIAGNOSIS — I5032 Chronic diastolic (congestive) heart failure: Secondary | ICD-10-CM | POA: Diagnosis present

## 2023-08-03 DIAGNOSIS — A419 Sepsis, unspecified organism: Secondary | ICD-10-CM | POA: Diagnosis not present

## 2023-08-03 DIAGNOSIS — Z955 Presence of coronary angioplasty implant and graft: Secondary | ICD-10-CM | POA: Diagnosis not present

## 2023-08-03 DIAGNOSIS — J441 Chronic obstructive pulmonary disease with (acute) exacerbation: Secondary | ICD-10-CM | POA: Insufficient documentation

## 2023-08-03 DIAGNOSIS — I130 Hypertensive heart and chronic kidney disease with heart failure and stage 1 through stage 4 chronic kidney disease, or unspecified chronic kidney disease: Secondary | ICD-10-CM | POA: Diagnosis not present

## 2023-08-03 DIAGNOSIS — E039 Hypothyroidism, unspecified: Secondary | ICD-10-CM | POA: Diagnosis not present

## 2023-08-03 DIAGNOSIS — Z8673 Personal history of transient ischemic attack (TIA), and cerebral infarction without residual deficits: Secondary | ICD-10-CM | POA: Diagnosis not present

## 2023-08-03 DIAGNOSIS — Z79899 Other long term (current) drug therapy: Secondary | ICD-10-CM | POA: Insufficient documentation

## 2023-08-03 DIAGNOSIS — N189 Chronic kidney disease, unspecified: Secondary | ICD-10-CM | POA: Insufficient documentation

## 2023-08-03 DIAGNOSIS — R0902 Hypoxemia: Secondary | ICD-10-CM | POA: Diagnosis not present

## 2023-08-03 DIAGNOSIS — G8929 Other chronic pain: Secondary | ICD-10-CM | POA: Diagnosis present

## 2023-08-03 DIAGNOSIS — F17210 Nicotine dependence, cigarettes, uncomplicated: Secondary | ICD-10-CM | POA: Diagnosis not present

## 2023-08-03 DIAGNOSIS — R0602 Shortness of breath: Secondary | ICD-10-CM | POA: Diagnosis not present

## 2023-08-03 DIAGNOSIS — J189 Pneumonia, unspecified organism: Secondary | ICD-10-CM | POA: Diagnosis not present

## 2023-08-03 DIAGNOSIS — Z720 Tobacco use: Secondary | ICD-10-CM | POA: Diagnosis present

## 2023-08-03 LAB — CBC WITH DIFFERENTIAL/PLATELET
Abs Immature Granulocytes: 0.04 10*3/uL (ref 0.00–0.07)
Basophils Absolute: 0.1 10*3/uL (ref 0.0–0.1)
Basophils Relative: 0 %
Eosinophils Absolute: 0 10*3/uL (ref 0.0–0.5)
Eosinophils Relative: 0 %
HCT: 44 % (ref 36.0–46.0)
Hemoglobin: 14.8 g/dL (ref 12.0–15.0)
Immature Granulocytes: 0 %
Lymphocytes Relative: 14 %
Lymphs Abs: 1.8 10*3/uL (ref 0.7–4.0)
MCH: 29.4 pg (ref 26.0–34.0)
MCHC: 33.6 g/dL (ref 30.0–36.0)
MCV: 87.5 fL (ref 80.0–100.0)
Monocytes Absolute: 1.2 10*3/uL — ABNORMAL HIGH (ref 0.1–1.0)
Monocytes Relative: 9 %
Neutro Abs: 9.6 10*3/uL — ABNORMAL HIGH (ref 1.7–7.7)
Neutrophils Relative %: 77 %
Platelets: 214 10*3/uL (ref 150–400)
RBC: 5.03 MIL/uL (ref 3.87–5.11)
RDW: 13.7 % (ref 11.5–15.5)
WBC: 12.1 10*3/uL — ABNORMAL HIGH (ref 4.0–10.5)
nRBC: 0 % (ref 0.0–0.2)

## 2023-08-03 LAB — RESP PANEL BY RT-PCR (RSV, FLU A&B, COVID)  RVPGX2
Influenza A by PCR: NEGATIVE
Influenza B by PCR: NEGATIVE
Resp Syncytial Virus by PCR: NEGATIVE
SARS Coronavirus 2 by RT PCR: NEGATIVE

## 2023-08-03 LAB — I-STAT VENOUS BLOOD GAS, ED
Acid-Base Excess: 0 mmol/L (ref 0.0–2.0)
Bicarbonate: 26.4 mmol/L (ref 20.0–28.0)
Calcium, Ion: 1.18 mmol/L (ref 1.15–1.40)
HCT: 45 % (ref 36.0–46.0)
Hemoglobin: 15.3 g/dL — ABNORMAL HIGH (ref 12.0–15.0)
O2 Saturation: 42 %
Patient temperature: 99.1
Potassium: 4 mmol/L (ref 3.5–5.1)
Sodium: 138 mmol/L (ref 135–145)
TCO2: 28 mmol/L (ref 22–32)
pCO2, Ven: 47.6 mm[Hg] (ref 44–60)
pH, Ven: 7.353 (ref 7.25–7.43)
pO2, Ven: 26 mm[Hg] — CL (ref 32–45)

## 2023-08-03 LAB — CBC
HCT: 42.1 % (ref 36.0–46.0)
Hemoglobin: 13.8 g/dL (ref 12.0–15.0)
MCH: 29.4 pg (ref 26.0–34.0)
MCHC: 32.8 g/dL (ref 30.0–36.0)
MCV: 89.8 fL (ref 80.0–100.0)
Platelets: 199 10*3/uL (ref 150–400)
RBC: 4.69 MIL/uL (ref 3.87–5.11)
RDW: 13.6 % (ref 11.5–15.5)
WBC: 11.6 10*3/uL — ABNORMAL HIGH (ref 4.0–10.5)
nRBC: 0 % (ref 0.0–0.2)

## 2023-08-03 LAB — COMPREHENSIVE METABOLIC PANEL
ALT: 18 U/L (ref 0–44)
AST: 19 U/L (ref 15–41)
Albumin: 3.7 g/dL (ref 3.5–5.0)
Alkaline Phosphatase: 77 U/L (ref 38–126)
Anion gap: 11 (ref 5–15)
BUN: 10 mg/dL (ref 8–23)
CO2: 23 mmol/L (ref 22–32)
Calcium: 9.5 mg/dL (ref 8.9–10.3)
Chloride: 101 mmol/L (ref 98–111)
Creatinine, Ser: 0.85 mg/dL (ref 0.44–1.00)
GFR, Estimated: >60 mL/min (ref >60)
Glucose, Bld: 104 mg/dL — ABNORMAL HIGH (ref 70–99)
Potassium: 4.1 mmol/L (ref 3.5–5.1)
Sodium: 135 mmol/L (ref 135–145)
Total Bilirubin: 1.1 mg/dL (ref 0.0–1.2)
Total Protein: 8 g/dL (ref 6.5–8.1)

## 2023-08-03 LAB — BRAIN NATRIURETIC PEPTIDE: B Natriuretic Peptide: 79.4 pg/mL (ref 0.0–100.0)

## 2023-08-03 LAB — TROPONIN I (HIGH SENSITIVITY)
Troponin I (High Sensitivity): 13 ng/L (ref <18)
Troponin I (High Sensitivity): 15 ng/L (ref <18)

## 2023-08-03 LAB — MAGNESIUM: Magnesium: 2.1 mg/dL (ref 1.7–2.4)

## 2023-08-03 LAB — CREATININE, SERUM
Creatinine, Ser: 0.91 mg/dL (ref 0.44–1.00)
GFR, Estimated: >60 mL/min (ref >60)

## 2023-08-03 MED ORDER — ENOXAPARIN SODIUM 40 MG/0.4ML IJ SOSY
40.0000 mg | PREFILLED_SYRINGE | INTRAMUSCULAR | Status: DC
Start: 2023-08-04 — End: 2023-08-04
  Administered 2023-08-04: 40 mg via SUBCUTANEOUS
  Filled 2023-08-03: qty 0.4

## 2023-08-03 MED ORDER — MOMETASONE FURO-FORMOTEROL FUM 200-5 MCG/ACT IN AERO
2.0000 | INHALATION_SPRAY | Freq: Two times a day (BID) | RESPIRATORY_TRACT | Status: DC
  Administered 2023-08-04: 2 via RESPIRATORY_TRACT
  Filled 2023-08-03: qty 8.8

## 2023-08-03 MED ORDER — ALBUTEROL SULFATE (2.5 MG/3ML) 0.083% IN NEBU: 2.5000 mg | INHALATION_SOLUTION | RESPIRATORY_TRACT | Status: DC | PRN

## 2023-08-03 MED ORDER — IPRATROPIUM-ALBUTEROL 0.5-2.5 (3) MG/3ML IN SOLN: 3.0000 mL | RESPIRATORY_TRACT | Status: DC | PRN

## 2023-08-03 MED ORDER — NICOTINE POLACRILEX 2 MG MT GUM: 2.0000 mg | CHEWING_GUM | OROMUCOSAL | Status: DC | PRN

## 2023-08-03 MED ORDER — ALBUTEROL SULFATE HFA 108 (90 BASE) MCG/ACT IN AERS: 2.0000 | INHALATION_SPRAY | RESPIRATORY_TRACT | Status: DC | PRN

## 2023-08-03 MED ORDER — SODIUM CHLORIDE 0.9 % IV SOLN
1.0000 g | INTRAVENOUS | Status: DC
  Administered 2023-08-04: 1 g via INTRAVENOUS
  Filled 2023-08-03: qty 10

## 2023-08-03 MED ORDER — IPRATROPIUM-ALBUTEROL 0.5-2.5 (3) MG/3ML IN SOLN
3.0000 mL | Freq: Once | RESPIRATORY_TRACT | Status: AC
Start: 2023-08-03 — End: 2023-08-03
  Administered 2023-08-03: 3 mL via RESPIRATORY_TRACT
  Filled 2023-08-03: qty 3

## 2023-08-03 MED ORDER — ISOSORBIDE MONONITRATE ER 30 MG PO TB24
30.0000 mg | ORAL_TABLET | Freq: Every morning | ORAL | Status: DC
  Administered 2023-08-04: 30 mg via ORAL
  Filled 2023-08-03: qty 1

## 2023-08-03 MED ORDER — SODIUM CHLORIDE 0.9% FLUSH
3.0000 mL | Freq: Two times a day (BID) | INTRAVENOUS | Status: DC
  Administered 2023-08-03 – 2023-08-04 (×2): 3 mL via INTRAVENOUS

## 2023-08-03 MED ORDER — GABAPENTIN 300 MG PO CAPS: 300.0000 mg | ORAL_CAPSULE | Freq: Every evening | ORAL | Status: DC | PRN

## 2023-08-03 MED ORDER — POLYETHYLENE GLYCOL 3350 17 G PO PACK: 17.0000 g | PACK | Freq: Every day | ORAL | Status: DC | PRN

## 2023-08-03 MED ORDER — ACETAMINOPHEN 325 MG PO TABS
650.0000 mg | ORAL_TABLET | Freq: Four times a day (QID) | ORAL | Status: DC | PRN
Start: 2023-08-03 — End: 2023-08-04

## 2023-08-03 MED ORDER — MELATONIN 5 MG PO TABS: 10.0000 mg | ORAL_TABLET | Freq: Every evening | ORAL | Status: DC | PRN

## 2023-08-03 MED ORDER — MONTELUKAST SODIUM 10 MG PO TABS
10.0000 mg | ORAL_TABLET | Freq: Every day | ORAL | Status: DC
  Administered 2023-08-03: 10 mg via ORAL
  Filled 2023-08-03: qty 1

## 2023-08-03 MED ORDER — ALPRAZOLAM 0.5 MG PO TABS
1.0000 mg | ORAL_TABLET | Freq: Every day | ORAL | Status: DC
  Administered 2023-08-04: 1 mg via ORAL
  Filled 2023-08-03: qty 2

## 2023-08-03 MED ORDER — SODIUM CHLORIDE 0.9 % IV SOLN
1.0000 g | Freq: Once | INTRAVENOUS | Status: AC
  Administered 2023-08-03: 1 g via INTRAVENOUS
  Filled 2023-08-03: qty 10

## 2023-08-03 MED ORDER — FUROSEMIDE 40 MG PO TABS
40.0000 mg | ORAL_TABLET | Freq: Every day | ORAL | Status: DC
  Administered 2023-08-04: 40 mg via ORAL
  Filled 2023-08-03: qty 1

## 2023-08-03 MED ORDER — SODIUM CHLORIDE 0.9 % IV SOLN
500.0000 mg | INTRAVENOUS | Status: DC
  Filled 2023-08-03: qty 5

## 2023-08-03 MED ORDER — METHYLPREDNISOLONE SODIUM SUCC 125 MG IJ SOLR
125.0000 mg | Freq: Once | INTRAMUSCULAR | Status: AC
  Administered 2023-08-03: 125 mg via INTRAVENOUS
  Filled 2023-08-03: qty 2

## 2023-08-03 MED ORDER — SODIUM CHLORIDE 0.9 % IV SOLN
500.0000 mg | Freq: Once | INTRAVENOUS | Status: AC
  Administered 2023-08-03: 500 mg via INTRAVENOUS
  Filled 2023-08-03: qty 5

## 2023-08-03 MED ORDER — IPRATROPIUM-ALBUTEROL 0.5-2.5 (3) MG/3ML IN SOLN
3.0000 mL | Freq: Four times a day (QID) | RESPIRATORY_TRACT | Status: DC
  Administered 2023-08-03 – 2023-08-04 (×3): 3 mL via RESPIRATORY_TRACT
  Filled 2023-08-03 (×3): qty 3

## 2023-08-03 MED ORDER — LEVOTHYROXINE SODIUM 100 MCG PO TABS
100.0000 ug | ORAL_TABLET | Freq: Every day | ORAL | Status: DC
  Administered 2023-08-04: 100 ug via ORAL
  Filled 2023-08-03: qty 1

## 2023-08-03 MED ORDER — CYCLOBENZAPRINE HCL 5 MG PO TABS: 5.0000 mg | ORAL_TABLET | Freq: Three times a day (TID) | ORAL | Status: DC | PRN

## 2023-08-03 MED ORDER — ROSUVASTATIN CALCIUM 10 MG PO TABS
20.0000 mg | ORAL_TABLET | Freq: Every day | ORAL | Status: DC
  Administered 2023-08-04: 20 mg via ORAL
  Filled 2023-08-03: qty 2

## 2023-08-03 MED ORDER — ACETAMINOPHEN 650 MG RE SUPP: 650.0000 mg | Freq: Four times a day (QID) | RECTAL | Status: DC | PRN

## 2023-08-03 NOTE — Assessment & Plan Note (Signed)
 C/w synthroid

## 2023-08-03 NOTE — ED Triage Notes (Signed)
 Pt reports to ED with complaints of SOB and chest pain. States that she has been feeling bad since Tuesday. HX of COPD.

## 2023-08-03 NOTE — ED Notes (Signed)
 Called for repeat vital x3 no answer

## 2023-08-03 NOTE — Plan of Care (Signed)
 63 year old F with PMH of COPD, CAD, diastolic CHF, CVA, HTN, hypothyroidism, chronic pain and anxiety presented to med [facility] ED with shortness of breath, productive cough, wheezing and chest pain with cough for about 2 days.  Workup in ED concerning for sepsis due to RML pneumonia.  Slightly tachycardic 206.  Tachypneic to 37 but improved to 17.  Blood pressure stable.  Saturating at 99% on 2 L.  VBG, CMP and CBC without significant finding other than mild leukocytosis.  BNP and troponin negative.  COVID-19, influenza and RSV PCR nonreactive.  CXR concerning for RML infiltrate.  Received Solu-Medrol , DuoNeb, ceftriaxone  and Zithromax .  Admission requested for pneumonia.  Accepted to telemetry bed at [facility].

## 2023-08-03 NOTE — Assessment & Plan Note (Signed)
 Is a chronic diagnosis.  I will continue patient's chronic 40 mg Lasix.  Patient appears to be euvolemic

## 2023-08-03 NOTE — ED Notes (Signed)
   08/03/23 1452  Respiratory Severity Assessment  Heart Rate 1  Breath Sounds 1  Respiratory Pattern 2  Cough 1  Chest X Ray 2  O2 Requirements/ Pulse Ox Sat(%) 1  Mental Status 0  Dyspnea 2  Score Total 10  Aerosolized Bronchodilators  Aerosolized bronchodilator indications Aerosolized bronchodilator indicated;Reversible obstructive disease (per PFT);COPD exacerbation/maintenance therapy;Bronchospasm/Wheezing;Asthma/Reactive Airway Disease/PEFR<80% (Duonebs at home Q6 as needed at home)  Medication Plan of Care Hand held neb treatment  Bronchial Hygiene  Bronchial Hygiene Plan of Care Cough & Deep breath  Oxygen  Therapy  Oxygen  therapy indications Oxygen  therapy indicated;Hypoxemia;Hypoxia;SOB/Increased WOB;Increased myocardial O2 demand;SpO2 less than 92% or MD goal  Oxygen  Plan of care Nasal cannula  Respiratory Therapy Follow Up Assessment  Assessment follow up date 08/04/23   Taking Duonebs at home q6 prn. Will order Duonebs QID, currently has albuterol  MDI prn ordered.

## 2023-08-03 NOTE — Assessment & Plan Note (Addendum)
 Diagnosed in ER Patient did receive methylprednisolone  in the ER. currently seems to have had good clinical respone to ER Rx. I will defer redosing of systemic steroids to AM exam.  Continue with montelukast  and DuoNeb therapy.  Continue with Symbicort replacement therapy.  Patient does not use supplementary oxygen  at home

## 2023-08-03 NOTE — Plan of Care (Signed)

## 2023-08-03 NOTE — Assessment & Plan Note (Signed)
 Patient has intermittent elevated creatinine in chart noted.  Including yesterday.  Avoid NSAIDs

## 2023-08-03 NOTE — ED Provider Notes (Signed)
 [place] [facility] Provider Note   CSN: [id number] Arrival date & time: 08/03/23  1111     History  Chief Complaint  Patient presents with   Shortness of Breath    Jocelyn [name] is a 63 y.o. female.   Shortness of Breath 63 year old female history of CHF, COPD, hypertension, hyperlipidemia, prior TIA present for shortness of breath.  Symptom started on Tuesday.  She has had wheezing, cough productive of white sputum.  Some occasional chest discomfort just when she coughs.  No exertional chest pain.  No fevers.  Feels somewhat like her prior COPD.  She has had some bodyaches and feels like she may have the flu or the cold.     Home Medications Prior to Admission medications   Medication Sig Start Date End Date Taking? Authorizing Provider  albuterol  (PROVENTIL  HFA;VENTOLIN  HFA) 108 (90 Base) MCG/ACT inhaler Inhale 2 puffs into the lungs every 6 (six) hours as needed for wheezing or shortness of breath. 06/21/15 09/04/23  [provider]  albuterol  (PROVENTIL ) (2.5 MG/3ML) 0.083% nebulizer solution Take 3 mLs (2.5 mg total) by nebulization every 4 (four) hours as needed for wheezing or shortness of breath. 09/04/22   Bryn Bernardino NOVAK, MD  ALPRAZolam  (XANAX ) 1 MG tablet Take 1 mg by mouth daily. 05/10/20   [provider]  clopidogrel  (PLAVIX ) 75 MG tablet Take 75 mg by mouth daily.    [provider]  cyclobenzaprine  (FLEXERIL ) 10 MG tablet Take by mouth 3 (three) times daily as needed. 06/30/15   [provider]  diclofenac Sodium (VOLTAREN) 1 % GEL SMARTSIG:Gram(s) Topical 4 Times Daily Patient not taking: Reported on 03/06/2023 08/23/22   [provider]  furosemide  (LASIX ) 40 MG tablet Take 40 mg by mouth daily.    [provider]  gabapentin  (NEURONTIN ) 300 MG capsule Take 300 mg by mouth at bedtime as needed (pain). 10/05/21   [provider]  ibuprofen  (ADVIL ) 800 MG tablet Take 800 mg by  mouth daily as needed. 09/01/22   [provider]  ipratropium-albuterol  (DUONEB) 0.5-2.5 (3) MG/3ML SOLN Take 3 mLs by nebulization every 6 (six) hours as needed. Patient taking differently: Take 3 mLs by nebulization every 6 (six) hours as needed (shortness of breath, wheezing). 05/21/21   [name], Brayton RAMAN, MD  levothyroxine  (SYNTHROID ) 100 MCG tablet Take 100 mcg by mouth daily. 11/02/21   [provider]  MELATONIN PO Take 10-20 mg by mouth at bedtime as needed (sleep).    [provider]  Methylsulfonylmethane (MSM) 1000 MG TABS Take 1,000 mg by mouth daily.    [provider]  montelukast  (SINGULAIR ) 10 MG tablet Take 10 mg by mouth daily. 10/12/22   [provider]  rosuvastatin  (CRESTOR ) 20 MG tablet Take 20 mg by mouth daily. Patient not taking: Reported on 03/06/2023 06/07/22   [provider]  SYMBICORT 160-4.5 MCG/ACT inhaler Inhale 2 puffs into the lungs 2 (two) times daily. 08/08/22   [provider]  triamcinolone  cream (KENALOG) 0.5 % Apply 1 application topically daily as needed (for sun exposure).    [provider]      Allergies    Hydrocodone     Review of Systems   Review of Systems  Respiratory:  Positive for shortness of breath.   Review of systems completed and notable as per HPI.  ROS otherwise negative.   Physical Exam Updated Vital Signs BP 103/68   Pulse 94   Temp  99.1 F (37.3 C) (Oral)   Resp 17   Ht 5' 4 (1.626 m)   Wt 90.7 kg   SpO2 99%   BMI 34.33 kg/m  Physical Exam Vitals and nursing note reviewed.  Constitutional:      General: She is not in acute distress.    Appearance: She is well-developed.  HENT:     Head: Normocephalic and atraumatic.     Mouth/Throat:     Mouth: Mucous membranes are moist.     Pharynx: Oropharynx is clear.  Eyes:     Extraocular Movements: Extraocular movements intact.     Conjunctiva/sclera: Conjunctivae normal.     Pupils: Pupils are equal,  round, and reactive to light.  Cardiovascular:     Rate and Rhythm: Normal rate and regular rhythm.     Pulses: Normal pulses.     Heart sounds: Normal heart sounds. No murmur heard. Pulmonary:     Effort: Pulmonary effort is normal. No respiratory distress.     Breath sounds: Decreased breath sounds and wheezing present.  Abdominal:     Palpations: Abdomen is soft.     Tenderness: There is no abdominal tenderness.  Musculoskeletal:        General: No swelling.     Cervical back: Neck supple.     Right lower leg: No edema.     Left lower leg: No edema.  Skin:    General: Skin is warm and dry.     Capillary Refill: Capillary refill takes less than 2 seconds.  Neurological:     General: No focal deficit present.     Mental Status: She is alert and oriented to person, place, and time. Mental status is at baseline.  Psychiatric:        Mood and Affect: Mood normal.     ED Results / Procedures / Treatments   Labs (all labs ordered are listed, but only abnormal results are displayed) Labs Reviewed  COMPREHENSIVE METABOLIC PANEL - Abnormal; Notable for the following components:      Result Value   Glucose, Bld 104 (*)    All other components within normal limits  CBC WITH DIFFERENTIAL/PLATELET - Abnormal; Notable for the following components:   WBC 12.1 (*)    Neutro Abs 9.6 (*)    Monocytes Absolute 1.2 (*)    All other components within normal limits  I-STAT VENOUS BLOOD GAS, ED - Abnormal; Notable for the following components:   pO2, Ven 26 (*)    Hemoglobin 15.3 (*)    All other components within normal limits  RESP PANEL BY RT-PCR (RSV, FLU A&B, COVID)  [id number]  BRAIN NATRIURETIC PEPTIDE  TROPONIN I (HIGH SENSITIVITY)  TROPONIN I (HIGH SENSITIVITY)    EKG EKG Interpretation Date/Time:  Thursday August 03 2023 11:22:48 EST Ventricular Rate:  110 PR Interval:  139 QRS Duration:  97 QT Interval:  351 QTC Calculation: 475 R Axis:   32  Text Interpretation: Sinus  tachycardia Ventricular premature complex Probable left atrial enlargement Abnormal R-wave progression, early transition Inferior infarct, old Confirmed by Nicholaus Dolphin [phone]) on 08/03/2023 12:08:59 PM  Radiology DG Chest 2 View Result Date: 08/03/2023 CLINICAL DATA:  Shortness of breath. EXAM: CHEST - 2 VIEW COMPARISON:  09/02/2022. FINDINGS: There is a new heterogeneous approximately 2.1 x 2.4 cm opacity overlying the right mid lung zone, concerning for focal pneumonia. Follow-up to clearing is recommended. Bilateral lung fields are otherwise clear. Bilateral costophrenic angles are clear. Normal cardio-mediastinal silhouette. Loop recorder  device noted overlying the left middle anterior chest wall. No acute osseous abnormalities. The soft tissues are within normal limits. IMPRESSION: *New heterogeneous opacity overlying the right mid lung zone, concerning for focal pneumonia. Follow-up to clearing is recommended. Electronically Signed   By: Ree Molt M.D.   On: 08/03/2023 13:42    Procedures Procedures    Medications Ordered in ED Medications  albuterol  (VENTOLIN  HFA) 108 (90 Base) MCG/ACT inhaler 2 puff (has no administration in time range)  azithromycin  (ZITHROMAX ) 500 mg in sodium chloride  0.9 % 250 mL IVPB (has no administration in time range)  ipratropium-albuterol  (DUONEB) 0.5-2.5 (3) MG/3ML nebulizer solution 3 mL (3 mLs Nebulization Given 08/03/23 1206)  methylPREDNISolone  sodium succinate (SOLU-MEDROL ) 125 mg/2 mL injection 125 mg (125 mg Intravenous Given 08/03/23 1206)  cefTRIAXone  (ROCEPHIN ) 1 g in sodium chloride  0.9 % 100 mL IVPB (1 g Intravenous New Bag/Given 08/03/23 1410)    ED Course/ Medical Decision Making/ A&P Clinical Course as of 08/03/23 1446  Thu Aug 03, 2023  1424 Discussed with hospitalist for admission. [JD]    Clinical Course User Index [JD] Nicholaus Cassondra DEL, MD                                 Medical Decision Making Amount and/or Complexity of Data  Reviewed Labs: ordered. Radiology: ordered.  Risk Prescription drug management. Decision regarding hospitalization.   Medical Decision Making:   Zenobia KALLI GREENFIELD is a 63 y.o. female who presented to the ED today with shortness of breath.  Vital signs and for mild tachycardia and mild tachypnea.  Exam she has wheezing advanced breath sounds consistent with likely COPD.  She does have heart failure as well appears roughly euvolemic.  She has some chest discomfort with cough, EKG is nonischemic lower concern for ACS.  Will give breathing treatment, steroids and reassess.   Patient placed on continuous vitals and telemetry monitoring while in ED which was reviewed periodically.  Reviewed and confirmed nursing documentation for past medical history, family history, social history.  Reassessment and Plan:   Chest x-ray reviewed concerning for pneumonia.  Antibiotics ordered.  Has mild leukocytosis.  COVID and flu are negative.  Labs otherwise reassuring including troponin.  BNP is normal.  She is feeling somewhat better with the breathing treatment she still has intermittent tachypnea and a new tubular ox requirement.  Poor scores intermediate risk and given her ox requirement and tachypnea I think she would benefit from observation for management of her pneumonia and COPD exacerbation.  Discussed with hospitalist and accepted for admission.   Patient's presentation is most consistent with acute presentation with potential threat to life or bodily function.           Final Clinical Impression(s) / ED Diagnoses Final diagnoses:  Community acquired pneumonia of right middle lobe of lung    Rx / DC Orders ED Discharge Orders     None         Nicholaus Cassondra DEL, MD 08/03/23 1446

## 2023-08-03 NOTE — H&P (Signed)
 History and Physical    Patient: Jocelyn [name] FMW:[id number] DOB: [birth year] DOA: 08/03/2023 DOS: the patient was seen and examined on 08/03/2023 PCP: Catalina Bare, MD  Patient coming from: Home>[facility] ER>[facility] inpatient unit  Chief Complaint:  Chief Complaint  Patient presents with   Shortness of Breath   HPI: Jocelyn [name] is a 63 y.o. female with medical history significant of COPD.  However patient does not typically use supplementary oxygen  at home.  Patient reports being in her usual state of health till approximately 3 days ago when she reports a new onset of cough with marked expectoration.  Greenish.  For the last 48 hours patient also reports exertional sensation of shortness of breath while walking on level feel which is new.  Patient reports feeling very warm at home however there is no documented fever no leg swelling or new cramping is reported.  Patient apparently came to the [facility] ER yesterday.  However patient was not fully evaluated.  Patient returned today because of her persistent symptoms.  Workup in the ER does not document any hypoxia on room air.  However patient has been placed on supplementary oxygen  for comfort.  Saturating 100% on same.  Chest x-ray reveals a right middle lobe/zone infiltration concerning for pneumonia.  Medical evaluation is sought  Patient is evaluated at [facility] inpatient unit after transfer from outside ER.  At this time patient reports feeling awesome .  Patient does not have ongoing sensation of shortness of breath while at rest at least.  On 2 L/min of supplementary oxygen .  Patient is currently not having a cough, although she has still had occasional coughing.  No chest pain no fever here. Patient denies diarrhea although it is recorded in ER triads note from yesterday  Review of Systems: As mentioned in the history of present illness. All other systems reviewed and are negative. Past Medical History:  Diagnosis Date   Anxiety     Arthritis    Chronic diastolic CHF (congestive heart failure), NYHA class 2 (HCC)    COPD (chronic obstructive pulmonary disease) (HCC)    Depression    HTN (hypertension)    Hypercholesterolemia    Hypothyroidism (acquired)    Kidney stones 2015   Myocardial infarction [facility])    x3- pt unsure if MIs were in 1996 or 1999, had stent fixed in 2016 but no MI at that time per pt   Stroke [facility]) 06/2021   Thyrotoxicosis    TIA (transient ischemic attack)    2013   Past Surgical History:  Procedure Laterality Date   BREAST CYST EXCISION     BUBBLE STUDY  06/10/2021   Procedure: BUBBLE STUDY;  Surgeon: Pietro Redell RAMAN, MD;  Location: [facility] ENDOSCOPY;  Service: Cardiovascular;;   CARDIAC CATHETERIZATION N/A 08/24/2015   Procedure: Left Heart Cath and Coronary Angiography;  Surgeon: Candyce RAMAN Reek, MD;  Location: [facility] INVASIVE CV LAB;  Service: Cardiovascular;  Laterality: N/A;   COLONOSCOPY     CORONARY STENT PLACEMENT  1999   and then had procedure to repair in 2016   KNEE SURGERY     LOOP RECORDER INSERTION N/A 06/10/2021   Procedure: LOOP RECORDER INSERTION;  Surgeon: Kelsie Agent, MD;  Location: [facility] INVASIVE CV LAB;  Service: Cardiovascular;  Laterality: N/A;   partial hysterec     TEE WITHOUT CARDIOVERSION N/A 06/10/2021   Procedure: TRANSESOPHAGEAL ECHOCARDIOGRAM (TEE);  Surgeon: Pietro Redell RAMAN, MD;  Location: [facility] ENDOSCOPY;  Service: Cardiovascular;  Laterality: N/A;  THYROIDECTOMY     Social History:  reports that she has been smoking cigarettes. She has been exposed to tobacco smoke. She has never used smokeless tobacco. She reports current alcohol use. She reports that she does not use drugs.  Allergies  Allergen Reactions   Hydrocodone  Itching    Has problems with high doses    Family History  Problem Relation Age of Onset   Heart disease Mother    Stroke Mother    Hypertension Mother    Heart disease Father    Yvone' disease Father    Colon cancer Father         pt unsure of age onset, believes he was in his 81s   Gout Brother    Gout Brother    Cancer Brother    Stroke Brother    Diabetes Maternal Grandmother    Hypertension Maternal Grandmother    Heart attack Neg Hx    Esophageal cancer Neg Hx    Stomach cancer Neg Hx    Rectal cancer Neg Hx     Prior to Admission medications   Medication Sig Start Date End Date Taking? Authorizing Provider  albuterol  (PROVENTIL  HFA;VENTOLIN  HFA) 108 (90 Base) MCG/ACT inhaler Inhale 2 puffs into the lungs every 6 (six) hours as needed for wheezing or shortness of breath. 06/21/15 09/04/23 Yes [provider]  albuterol  (PROVENTIL ) (2.5 MG/3ML) 0.083% nebulizer solution Take 3 mLs (2.5 mg total) by nebulization every 4 (four) hours as needed for wheezing or shortness of breath. 09/04/22  Yes Bryn Bernardino NOVAK, MD  ALPRAZolam  (XANAX ) 1 MG tablet Take 1 mg by mouth daily. 05/10/20  Yes [provider]  cyclobenzaprine  (FLEXERIL ) 10 MG tablet Take by mouth 3 (three) times daily as needed. 06/30/15  Yes [provider]  furosemide  (LASIX ) 40 MG tablet Take 40 mg by mouth daily.   Yes [provider]  gabapentin  (NEURONTIN ) 300 MG capsule Take 300 mg by mouth at bedtime as needed (pain). 10/05/21  Yes [provider]  ibuprofen  (ADVIL ) 800 MG tablet Take 800 mg by mouth daily as needed. 09/01/22  Yes [provider]  ipratropium-albuterol  (DUONEB) 0.5-2.5 (3) MG/3ML SOLN Take 3 mLs by nebulization every 6 (six) hours as needed. Patient taking differently: Take 3 mLs by nebulization every 6 (six) hours as needed (shortness of breath, wheezing). 05/21/21  Yes [name], Brayton RAMAN, MD  isosorbide  mononitrate (IMDUR ) 30 MG 24 hr tablet Take 30 mg by mouth every morning. 07/06/23  Yes [provider]  levothyroxine  (SYNTHROID ) 100 MCG tablet Take 100 mcg by mouth daily. 11/02/21  Yes [provider]  MELATONIN PO Take 10-20 mg by mouth at bedtime as needed (sleep).    Yes [provider]  Methylsulfonylmethane (MSM) 1000 MG TABS Take 1,000 mg by mouth daily.   Yes [provider]  montelukast  (SINGULAIR ) 10 MG tablet Take 10 mg by mouth daily. 10/12/22  Yes [provider]  rosuvastatin  (CRESTOR ) 20 MG tablet Take 20 mg by mouth daily. 06/07/22  Yes [provider]  SYMBICORT 160-4.5 MCG/ACT inhaler Inhale 2 puffs into the lungs 2 (two) times daily. 08/08/22  Yes [provider]  triamcinolone  cream (KENALOG) 0.5 % Apply 1 application topically daily as needed (for sun exposure). Patient not taking: Reported on 08/03/2023    [provider]    Physical Exam: Vitals:   08/03/23 1533 08/03/23 1600 08/03/23 1634 08/03/23 1756  BP:  107/72  131/69  Pulse:  89  93  Resp:  (!) 26  19  Temp: 99.5 F (37.5 C)   98.4 F (36.9 C)  TempSrc: Oral   Oral  SpO2:  97% 96% 98%  Weight:      Height:       General: Patient is alert and awake, no distress.  Pleasant and good mood. Respiratory exam: Bilateral intravesicular Cardiovascular exam S1-S2 normal Abdomen all quadrants are soft nontender Extremities warm without edema Data Reviewed:  Labs on Admission:  Results for orders placed or performed during the hospital encounter of 08/03/23 (from the past 24 hours)  Resp panel by RT-PCR (RSV, Flu A&B, Covid) Anterior Nasal Swab     Status: None   Collection Time: 08/03/23 11:19 AM   Specimen: Anterior Nasal Swab  Result Value Ref Range   SARS Coronavirus 2 by RT PCR NEGATIVE NEGATIVE   Influenza A by PCR NEGATIVE NEGATIVE   Influenza B by PCR NEGATIVE NEGATIVE   Resp Syncytial Virus by PCR NEGATIVE NEGATIVE  Comprehensive metabolic panel     Status: Abnormal   Collection Time: 08/03/23 12:05 PM  Result Value Ref Range   Sodium 135 135 - 145 mmol/L   Potassium 4.1 3.5 - 5.1 mmol/L   Chloride 101 98 - 111 mmol/L   CO2 23 22 - 32 mmol/L   Glucose, Bld 104 (H) 70 - 99 mg/dL   BUN 10 8 - 23 mg/dL    Creatinine, Ser 9.14 0.44 - 1.00 mg/dL   Calcium  9.5 8.9 - 10.3 mg/dL   Total Protein 8.0 6.5 - 8.1 g/dL   Albumin 3.7 3.5 - 5.0 g/dL   AST 19 15 - 41 U/L   ALT 18 0 - 44 U/L   Alkaline Phosphatase 77 38 - 126 U/L   Total Bilirubin 1.1 0.0 - 1.2 mg/dL   GFR, Estimated >39 >39 mL/min   Anion gap 11 5 - 15  Troponin I (High Sensitivity)     Status: None   Collection Time: 08/03/23 12:05 PM  Result Value Ref Range   Troponin I (High Sensitivity) 13 <18 ng/L  CBC with Differential     Status: Abnormal   Collection Time: 08/03/23 12:05 PM  Result Value Ref Range   WBC 12.1 (H) 4.0 - 10.5 K/uL   RBC 5.03 3.87 - 5.11 MIL/uL   Hemoglobin 14.8 12.0 - 15.0 g/dL   HCT 55.9 63.9 - 53.9 %   MCV 87.5 80.0 - 100.0 fL   MCH 29.4 26.0 - 34.0 pg   MCHC 33.6 30.0 - 36.0 g/dL   RDW 86.2 88.4 - 84.4 %   Platelets 214 150 - 400 K/uL   nRBC 0.0 0.0 - 0.2 %   Neutrophils Relative % 77 %   Neutro Abs 9.6 (H) 1.7 - 7.7 K/uL   Lymphocytes Relative 14 %   Lymphs Abs 1.8 0.7 - 4.0 K/uL   Monocytes Relative 9 %   Monocytes Absolute 1.2 (H) 0.1 - 1.0 K/uL   Eosinophils Relative 0 %   Eosinophils Absolute 0.0 0.0 - 0.5 K/uL   Basophils Relative 0 %   Basophils Absolute 0.1 0.0 - 0.1 K/uL   Immature Granulocytes 0 %   Abs Immature Granulocytes 0.04 0.00 - 0.07 K/uL  Brain natriuretic peptide     Status: None   Collection Time: 08/03/23 12:05 PM  Result Value Ref Range   B Natriuretic Peptide 79.4 0.0 - 100.0 pg/mL  I-Stat venous blood gas, ED     Status:  Abnormal   Collection Time: 08/03/23 12:55 PM  Result Value Ref Range   pH, Ven 7.353 7.25 - 7.43   pCO2, Ven 47.6 44 - 60 mmHg   pO2, Ven 26 (LL) 32 - 45 mmHg   Bicarbonate 26.4 20.0 - 28.0 mmol/L   TCO2 28 22 - 32 mmol/L   O2 Saturation 42 %   Acid-Base Excess 0.0 0.0 - 2.0 mmol/L   Sodium 138 135 - 145 mmol/L   Potassium 4.0 3.5 - 5.1 mmol/L   Calcium , Ion 1.18 1.15 - 1.40 mmol/L   HCT 45.0 36.0 - 46.0 %   Hemoglobin 15.3 (H) 12.0 - 15.0  g/dL   Patient temperature 00.8 F    Sample type VENOUS    Comment VALUES EXPECTED, NO REPEAT   Troponin I (High Sensitivity)     Status: None   Collection Time: 08/03/23  1:52 PM  Result Value Ref Range   Troponin I (High Sensitivity) 15 <18 ng/L  CBC     Status: Abnormal   Collection Time: 08/03/23  7:44 PM  Result Value Ref Range   WBC 11.6 (H) 4.0 - 10.5 K/uL   RBC 4.69 3.87 - 5.11 MIL/uL   Hemoglobin 13.8 12.0 - 15.0 g/dL   HCT 57.8 63.9 - 53.9 %   MCV 89.8 80.0 - 100.0 fL   MCH 29.4 26.0 - 34.0 pg   MCHC 32.8 30.0 - 36.0 g/dL   RDW 86.3 88.4 - 84.4 %   Platelets 199 150 - 400 K/uL   nRBC 0.0 0.0 - 0.2 %  Creatinine, serum     Status: None   Collection Time: 08/03/23  7:44 PM  Result Value Ref Range   Creatinine, Ser 0.91 0.44 - 1.00 mg/dL   GFR, Estimated >39 >39 mL/min   Basic Metabolic Panel: Recent Labs  Lab 08/02/23 2050 08/03/23 1205 08/03/23 1255 08/03/23 1944  NA 136 135 138  --   K 3.7 4.1 4.0  --   CL 104 101  --   --   CO2 20* 23  --   --   GLUCOSE 112* 104*  --   --   BUN 12 10  --   --   CREATININE 1.10* 0.85  --  0.91  CALCIUM  9.4 9.5  --   --    Liver Function Tests: Recent Labs  Lab 08/02/23 2050 08/03/23 1205  AST 22 19  ALT 18 18  ALKPHOS 75 77  BILITOT 1.0 1.1  PROT 7.2 8.0  ALBUMIN 3.3* 3.7   No results for input(s): LIPASE, AMYLASE in the last 168 hours. No results for input(s): AMMONIA in the last 168 hours. CBC: Recent Labs  Lab 08/02/23 2050 08/03/23 1205 08/03/23 1255 08/03/23 1944  WBC 12.4* 12.1*  --  11.6*  NEUTROABS 8.5* 9.6*  --   --   HGB 14.5 14.8 15.3* 13.8  HCT 42.1 44.0 45.0 42.1  MCV 87.5 87.5  --  89.8  PLT 217 214  --  199   Cardiac Enzymes: Recent Labs  Lab 08/03/23 1205 08/03/23 1352  TROPONINIHS 13 15    BNP (last 3 results) No results for input(s): PROBNP in the last 8760 hours. CBG: No results for input(s): GLUCAP in the last 168 hours.  Radiological Exams on Admission:  DG  Chest 2 View Result Date: 08/03/2023 CLINICAL DATA:  Shortness of breath. EXAM: CHEST - 2 VIEW COMPARISON:  09/02/2022. FINDINGS: There is a new heterogeneous approximately 2.1 x 2.4  cm opacity overlying the right mid lung zone, concerning for focal pneumonia. Follow-up to clearing is recommended. Bilateral lung fields are otherwise clear. Bilateral costophrenic angles are clear. Normal cardio-mediastinal silhouette. Loop recorder device noted overlying the left middle anterior chest wall. No acute osseous abnormalities. The soft tissues are within normal limits. IMPRESSION: *New heterogeneous opacity overlying the right mid lung zone, concerning for focal pneumonia. Follow-up to clearing is recommended. Electronically Signed   By: Ree Molt M.D.   On: 08/03/2023 13:42     Assessment and Plan: * Sepsis due to pneumonia (HCC) Evident by tachycardia as well as leukocytosis and high respiratory rate in ER.  At this time sepsis seems to have resolved.  We will continue with ceftriaxone  and azithromycin .  Blood cultures would be pretty low yield at this time as patient has already received IV antibiotics.  Patient seems very interested in getting released from the hospital tomorrow.  I cautioned that it really depends on how the patient is doing.  However to help in the decision making I will order ambulatory pulse oximetry for tomorrow morning.  In the interim continue with 2 L/min of supplementary oxygen  for comfort at this time  COPD exacerbation (HCC) Diagnosed in ER Patient did receive methylprednisolone  in the ER. currently seems to have had good clinical respone to ER Rx. I will defer redosing of systemic steroids to AM exam.  Continue with montelukast  and DuoNeb therapy.  Continue with Symbicort replacement therapy.  Patient does not use supplementary oxygen  at home  Hypothyroidism C//w synthroid   Chronic diastolic CHF (congestive heart failure) (HCC) Is a chronic diagnosis.  I will continue  patient's chronic 40 mg Lasix .  Patient appears to be euvolemic  CKD (chronic kidney disease) Patient has intermittent elevated creatinine in chart noted.  Including yesterday.  Avoid NSAIDs  Chronic right-sided low back pain with right-sided sciatica Continue with gabapentin .  Patient is reporting chronic cramping as well, I will add on a magnesium  level. Hold off on methylsulfonylmethane at this time.c/w cyclobenzaprine   Tobacco abuse Smoking cessation education to be provided. Replacement for nicotine  ordered      Advance Care Planning:   Code Status: Full Code   Consults: None  Family Communication: Per patient  Severity of Illness: The appropriate patient status for this patient is INPATIENT. Inpatient status is judged to be reasonable and necessary in order to provide the required intensity of service to ensure the patient's safety. The patient's presenting symptoms, physical exam findings, and initial radiographic and laboratory data in the context of their chronic comorbidities is felt to place them at high risk for further clinical deterioration. Furthermore, it is not anticipated that the patient will be medically stable for discharge from the hospital within 2 midnights of admission.   * I certify that at the point of admission it is my clinical judgment that the patient will require inpatient hospital care spanning beyond 2 midnights from the point of admission due to high intensity of service, high risk for further deterioration and high frequency of surveillance required.*  Author: Jacqulyn Divine, MD 08/03/2023 8:31 PM  For on call review [email or web address].

## 2023-08-03 NOTE — Assessment & Plan Note (Addendum)
 Evident by tachycardia as well as leukocytosis and high respiratory rate in ER.  At this time sepsis seems to have resolved.  We will continue with ceftriaxone  and azithromycin .  Blood cultures would be pretty low yield at this time as patient has already received IV antibiotics.  Patient seems very interested in getting released from the hospital tomorrow.  I cautioned that it really depends on how the patient is doing.  However to help in the decision making I will order ambulatory pulse oximetry for tomorrow morning.  In the interim continue with 2 L/min of supplementary oxygen  for comfort at this time

## 2023-08-03 NOTE — Assessment & Plan Note (Addendum)
 Continue with gabapentin.  Patient is reporting chronic cramping as well, I will add on a magnesium level. Hold off on methylsulfonylmethane at this time.c/w cyclobenzaprine

## 2023-08-03 NOTE — Assessment & Plan Note (Signed)
 Smoking cessation education to be provided. Replacement for nicotine ordered

## 2023-08-03 NOTE — ED Notes (Signed)
   08/03/23 1120  Respiratory Assessment  $ RT Protocol Assessment  Yes  Assessment Type Assess only  Respiratory Pattern Regular;Unlabored;Dyspnea with exertion;Symmetrical  Chest Assessment Chest expansion symmetrical  Cough Productive  Sputum Color Tan;Yellow  Sputum Specimen Source Spontaneous cough  Bilateral Breath Sounds Diminished  R Upper  Breath Sounds Diminished  L Upper Breath Sounds Diminished  R Lower Breath Sounds Fine crackles;Diminished  L Lower Breath Sounds Diminished  Oxygen  Therapy/Pulse Ox  O2 Therapy Room air   Last SABA 08/02/23 per patient.

## 2023-08-04 DIAGNOSIS — J189 Pneumonia, unspecified organism: Secondary | ICD-10-CM | POA: Diagnosis not present

## 2023-08-04 DIAGNOSIS — A419 Sepsis, unspecified organism: Secondary | ICD-10-CM | POA: Diagnosis not present

## 2023-08-04 LAB — BASIC METABOLIC PANEL
Anion gap: 10 (ref 5–15)
BUN: 13 mg/dL (ref 8–23)
CO2: 22 mmol/L (ref 22–32)
Calcium: 9.8 mg/dL (ref 8.9–10.3)
Chloride: 106 mmol/L (ref 98–111)
Creatinine, Ser: 0.48 mg/dL (ref 0.44–1.00)
GFR, Estimated: >60 mL/min (ref >60)
Glucose, Bld: 168 mg/dL — ABNORMAL HIGH (ref 70–99)
Potassium: 4.5 mmol/L (ref 3.5–5.1)
Sodium: 138 mmol/L (ref 135–145)

## 2023-08-04 LAB — CBC
HCT: 41.7 % (ref 36.0–46.0)
Hemoglobin: 13.9 g/dL (ref 12.0–15.0)
MCH: 29.4 pg (ref 26.0–34.0)
MCHC: 33.3 g/dL (ref 30.0–36.0)
MCV: 88.3 fL (ref 80.0–100.0)
Platelets: 231 10*3/uL (ref 150–400)
RBC: 4.72 MIL/uL (ref 3.87–5.11)
RDW: 13.2 % (ref 11.5–15.5)
WBC: 14.3 10*3/uL — ABNORMAL HIGH (ref 4.0–10.5)
nRBC: 0 % (ref 0.0–0.2)

## 2023-08-04 LAB — APTT: aPTT: 27 s (ref 24–36)

## 2023-08-04 LAB — PROTIME-INR
INR: 1.1 (ref 0.8–1.2)
Prothrombin Time: 14.1 s (ref 11.4–15.2)

## 2023-08-04 MED ORDER — GUAIFENESIN-DM 100-10 MG/5ML PO SYRP: 5.0000 mL | ORAL_SOLUTION | ORAL | 0 refills | Status: DC | PRN

## 2023-08-04 MED ORDER — PREDNISONE 10 MG PO TABS: ORAL_TABLET | ORAL | 0 refills | Status: DC

## 2023-08-04 MED ORDER — IPRATROPIUM-ALBUTEROL 0.5-2.5 (3) MG/3ML IN SOLN
3.0000 mL | Freq: Three times a day (TID) | RESPIRATORY_TRACT | Status: DC
  Administered 2023-08-04: 3 mL via RESPIRATORY_TRACT
  Filled 2023-08-04: qty 3

## 2023-08-04 MED ORDER — AZITHROMYCIN 250 MG PO TABS
500.0000 mg | ORAL_TABLET | Freq: Every day | ORAL | Status: DC
Start: 2023-08-04 — End: 2023-08-04
  Administered 2023-08-04: 500 mg via ORAL
  Filled 2023-08-04: qty 2

## 2023-08-04 MED ORDER — NICOTINE POLACRILEX 2 MG MT GUM: 2.0000 mg | CHEWING_GUM | OROMUCOSAL | 0 refills | Status: AC | PRN

## 2023-08-04 MED ORDER — DOXYCYCLINE HYCLATE 100 MG PO TABS: 100.0000 mg | ORAL_TABLET | Freq: Two times a day (BID) | ORAL | 0 refills | Status: AC

## 2023-08-04 NOTE — Progress Notes (Signed)
 SATURATION QUALIFICATIONS: (This note is used to comply with regulatory documentation for home oxygen )  Patient Saturations on Room Air at Rest = 95%  Patient Saturations on Room Air while Ambulating = 90%   The patient tolerated walking well and without difficulty. She was able to maintain her oxygen  saturation above 88% and did not require supplemental oxygen . Provider notified.

## 2023-08-04 NOTE — Discharge Summary (Signed)
 Physician Discharge Summary  Jocelyn [name] FMW:[id number] DOB: [birth year] DOA: 08/03/2023  PCP: Catalina Bare, MD  Admit date: 08/03/2023 Discharge date: 08/04/2023  Admitted From: Home  Discharge disposition: Home    Recommendations for Outpatient Follow-Up:   Follow up with your primary care provider in one week.  Check CBC, BMP, magnesium  in the next visit Patient should be encouraged to quit smoking.  Discharge Diagnosis:   Principal Problem:   Sepsis due to pneumonia [facility]) Active Problems:   COPD exacerbation (HCC)   Hypothyroidism   Chronic diastolic CHF (congestive heart failure) (HCC)   Tobacco abuse   Chronic right-sided low back pain with right-sided sciatica   CKD (chronic kidney disease)   Discharge Condition: Improved.  Diet recommendation: Low sodium, heart healthy.    Wound care: None.  Code status: Full.   History of Present Illness:    Jocelyn [name] is a 63 y.o. female with past medical history significant of COPD presented to hospital with 3-day history of cough with productive sputum and exertional shortness of breath which was new.  In the ED patient was placed on oxygen  for comfort.  In the ED, patient was mildly tachycardic.  Respiratory panel was negative.  CMP within normal range.  Troponin was negative.  BNP within normal range.  CBC showed mild leukocytosis chest x-ray showed infiltrate in the right middle zone concerning for pneumonia..  Patient was then admitted hospital for further evaluation and treatment  Hospital Course:   Following conditions were addressed during hospitalization as listed below,  Sepsis due to pneumonia  Patient was tachycardic with leukocytosis and tachypnea but with right middle lobe intiltrate suggestive of sepsis secondary to pneumonia..  Patient received ceftriaxone  and azithromycin  and was initially on 2 L of oxygen  by nasal cannula.  Temperature max of 99.5 F.  At this time patient feels significantly  better afebrile and has ambulated in the hallway without any need for supplemental oxygen .  Symptoms are minimal.  Blood cultures were not done.  Will continue doxycycline  on discharge.  COPD exacerbation Continue montelukast , DuoNebs, Symbicort.  Received 1 dose of Solu-Medrol  in the ED. will continue prednisone  taper on discharge.  Counseling was done regarding quitting smoking.   Hypothyroidism. Continue Synthroid    Chronic diastolic CHF (congestive heart failure) (HCC) Compensated.  On Lasix  40 milligram daily at home.  Will continue on discharge   Chronic right-sided low back pain with right-sided sciatica Continue gabapentin    Tobacco abuse Reinforced quitting smoking..  On nicotine  patch.  Will prescribe on discharge  Disposition.  At this time, patient is stable for disposition home with outpatient PCP follow-up.  Spoke with the patient's daughter at bedside  Medical Consultants:   None.  Procedures:    None Subjective:   Today, patient was seen and examined at bedside.  Feels significantly better breathing okay ambulated without need for oxygen .  Wishes to go home.  Discharge Exam:   Vitals:   08/04/23 1323 08/04/23 1420  BP:  (!) 105/58  Pulse:  86  Resp:  20  Temp:  97.8 F (36.6 C)  SpO2: 98% 96%   Vitals:   08/04/23 0700 08/04/23 0749 08/04/23 1323 08/04/23 1420  BP:    (!) 105/58  Pulse:    86  Resp:    20  Temp:    97.8 F (36.6 C)  TempSrc:      SpO2: (!) 89% 98% 98% 96%  Weight:      Height:  Body mass index is 34.33 kg/m.   General: Alert awake, not in obvious distress, obese, on room air HENT: pupils equally reacting to light,  No scleral pallor or icterus noted. Oral mucosa is moist.  Chest:   Diminished breath sounds bilaterally. No crackles or wheezes.  CVS: S1 &S2 heard. No murmur.  Regular rate and rhythm. Abdomen: Soft, nontender, nondistended.  Bowel sounds are heard.   Extremities: No cyanosis, clubbing or edema.   Peripheral pulses are palpable. Psych: Alert, awake and oriented, normal mood CNS:  No cranial nerve deficits.  Power equal in all extremities.   Skin: Warm and dry.  No rashes noted.  The results of significant diagnostics from this hospitalization (including imaging, microbiology, ancillary and laboratory) are listed below for reference.     Diagnostic Studies:   DG Chest 2 View Result Date: 08/03/2023 CLINICAL DATA:  Shortness of breath. EXAM: CHEST - 2 VIEW COMPARISON:  09/02/2022. FINDINGS: There is a new heterogeneous approximately 2.1 x 2.4 cm opacity overlying the right mid lung zone, concerning for focal pneumonia. Follow-up to clearing is recommended. Bilateral lung fields are otherwise clear. Bilateral costophrenic angles are clear. Normal cardio-mediastinal silhouette. Loop recorder device noted overlying the left middle anterior chest wall. No acute osseous abnormalities. The soft tissues are within normal limits. IMPRESSION: *New heterogeneous opacity overlying the right mid lung zone, concerning for focal pneumonia. Follow-up to clearing is recommended. Electronically Signed   By: Ree Molt M.D.   On: 08/03/2023 13:42     Labs:   Basic Metabolic Panel: Recent Labs  Lab 08/02/23 2050 08/03/23 1205 08/03/23 1255 08/03/23 1944 08/04/23 0550  NA 136 135 138  --  138  K 3.7 4.1 4.0  --  4.5  CL 104 101  --   --  106  CO2 20* 23  --   --  22  GLUCOSE 112* 104*  --   --  168*  BUN 12 10  --   --  13  CREATININE 1.10* 0.85  --  0.91 0.48  CALCIUM  9.4 9.5  --   --  9.8  MG  --   --   --  2.1  --    GFR Estimated Creatinine Clearance: 79.5 mL/min (by C-G formula based on SCr of 0.48 mg/dL). Liver Function Tests: Recent Labs  Lab 08/02/23 2050 08/03/23 1205  AST 22 19  ALT 18 18  ALKPHOS 75 77  BILITOT 1.0 1.1  PROT 7.2 8.0  ALBUMIN 3.3* 3.7   No results for input(s): LIPASE, AMYLASE in the last 168 hours. No results for input(s): AMMONIA in the last  168 hours. Coagulation profile Recent Labs  Lab 08/04/23 0550  INR 1.1    CBC: Recent Labs  Lab 08/02/23 2050 08/03/23 1205 08/03/23 1255 08/03/23 1944 08/04/23 0550  WBC 12.4* 12.1*  --  11.6* 14.3*  NEUTROABS 8.5* 9.6*  --   --   --   HGB 14.5 14.8 15.3* 13.8 13.9  HCT 42.1 44.0 45.0 42.1 41.7  MCV 87.5 87.5  --  89.8 88.3  PLT 217 214  --  199 231   Cardiac Enzymes: No results for input(s): CKTOTAL, CKMB, CKMBINDEX, TROPONINI in the last 168 hours. BNP: Invalid input(s): POCBNP CBG: No results for input(s): GLUCAP in the last 168 hours. D-Dimer No results for input(s): DDIMER in the last 72 hours. Hgb A1c No results for input(s): HGBA1C in the last 72 hours. Lipid Profile No results for input(s): CHOL, HDL,  LDLCALC, TRIG, CHOLHDL, LDLDIRECT in the last 72 hours. Thyroid  function studies No results for input(s): TSH, T4TOTAL, T3FREE, THYROIDAB in the last 72 hours.  Invalid input(s): FREET3 Anemia work up No results for input(s): VITAMINB12, FOLATE, FERRITIN, TIBC, IRON, RETICCTPCT in the last 72 hours. Microbiology Recent Results (from the past 240 hours)  Resp panel by RT-PCR (RSV, Flu A&B, Covid) Anterior Nasal Swab     Status: None   Collection Time: 08/03/23 11:19 AM   Specimen: Anterior Nasal Swab  Result Value Ref Range Status   SARS Coronavirus 2 by RT PCR NEGATIVE NEGATIVE Final    Comment: (NOTE) SARS-CoV-2 target nucleic acids are NOT DETECTED.  The SARS-CoV-2 RNA is generally detectable in upper respiratory specimens during the acute phase of infection. The lowest concentration of SARS-CoV-2 viral copies this assay can detect is 138 copies/mL. A negative result does not preclude SARS-Cov-2 infection and should not be used as the sole basis for treatment or other patient management decisions. A negative result may occur with  improper specimen collection/handling, submission of specimen other than  nasopharyngeal swab, presence of viral mutation(s) within the areas targeted by this assay, and inadequate number of viral copies(<138 copies/mL). A negative result must be combined with clinical observations, patient history, and epidemiological information. The expected result is Negative.  Fact Sheet for Patients:  bloggercourse.com  Fact Sheet for Healthcare Providers:  seriousbroker.it  This test is no t yet approved or cleared by the [place]  FDA and  has been authorized for detection and/or diagnosis of SARS-CoV-2 by FDA under an Emergency Use Authorization (EUA). This EUA will remain  in effect (meaning this test can be used) for the duration of the COVID-19 declaration under Section 564(b)(1) of the Act, 21 U.S.C.section 360bbb-3(b)(1), unless the authorization is terminated  or revoked sooner.       Influenza A by PCR NEGATIVE NEGATIVE Final   Influenza B by PCR NEGATIVE NEGATIVE Final    Comment: (NOTE) The Xpert Xpress SARS-CoV-2/FLU/RSV plus assay is intended as an aid in the diagnosis of influenza from Nasopharyngeal swab specimens and should not be used as a sole basis for treatment. Nasal washings and aspirates are unacceptable for Xpert Xpress SARS-CoV-2/FLU/RSV testing.  Fact Sheet for Patients: bloggercourse.com  Fact Sheet for Healthcare Providers: seriousbroker.it  This test is not yet approved or cleared by the [place]  FDA and has been authorized for detection and/or diagnosis of SARS-CoV-2 by FDA under an Emergency Use Authorization (EUA). This EUA will remain in effect (meaning this test can be used) for the duration of the COVID-19 declaration under Section 564(b)(1) of the Act, 21 U.S.C. section 360bbb-3(b)(1), unless the authorization is terminated or revoked.     Resp Syncytial Virus by PCR NEGATIVE NEGATIVE Final    Comment:  (NOTE) Fact Sheet for Patients: bloggercourse.com  Fact Sheet for Healthcare Providers: seriousbroker.it  This test is not yet approved or cleared by the [place]  FDA and has been authorized for detection and/or diagnosis of SARS-CoV-2 by FDA under an Emergency Use Authorization (EUA). This EUA will remain in effect (meaning this test can be used) for the duration of the COVID-19 declaration under Section 564(b)(1) of the Act, 21 U.S.C. section 360bbb-3(b)(1), unless the authorization is terminated or revoked.  Performed at [facility], [address] [place], [place] [address]      Discharge Instructions:   Discharge Instructions     Call MD for:  difficulty breathing, headache or visual disturbances  Complete by: As directed    Call MD for:  temperature >100.4   Complete by: As directed    Diet general   Complete by: As directed    Discharge instructions   Complete by: As directed    Follow-up with your primary care provider in 1 week.  Check blood work at that time.  Seek medical attention for worsening symptoms.  Complete course of steroid and antibiotic.  Please do not smoke.  Use nicotine  patch.  Please think about getting vaccination for flu.   Increase activity slowly   Complete by: As directed       Allergies as of 08/04/2023       Reactions   Hydrocodone  Itching   Has problems with high doses        Medication List     STOP taking these medications    triamcinolone  cream 0.5 % Commonly known as: KENALOG       TAKE these medications    albuterol  108 (90 Base) MCG/ACT inhaler Commonly known as: VENTOLIN  HFA Inhale 2 puffs into the lungs every 6 (six) hours as needed for wheezing or shortness of breath.   albuterol  (2.5 MG/3ML) 0.083% nebulizer solution Commonly known as: PROVENTIL  Take 3 mLs (2.5 mg total) by nebulization every 4 (four) hours as needed for wheezing or shortness  of breath.   ALPRAZolam  1 MG tablet Commonly known as: XANAX  Take 1 mg by mouth daily.   cyclobenzaprine  10 MG tablet Commonly known as: FLEXERIL  Take by mouth 3 (three) times daily as needed.   doxycycline  100 MG tablet Commonly known as: VIBRA -TABS Take 1 tablet (100 mg total) by mouth 2 (two) times daily for 5 days.   furosemide  40 MG tablet Commonly known as: LASIX  Take 40 mg by mouth daily.   gabapentin  300 MG capsule Commonly known as: NEURONTIN  Take 300 mg by mouth at bedtime as needed (pain).   guaiFENesin -dextromethorphan  100-10 MG/5ML syrup Commonly known as: ROBITUSSIN DM Take 5 mLs by mouth every 4 (four) hours as needed for cough.   ibuprofen  800 MG tablet Commonly known as: ADVIL  Take 800 mg by mouth daily as needed.   ipratropium-albuterol  0.5-2.5 (3) MG/3ML Soln Commonly known as: DUONEB Take 3 mLs by nebulization every 6 (six) hours as needed. What changed: reasons to take this   isosorbide  mononitrate 30 MG 24 hr tablet Commonly known as: IMDUR  Take 30 mg by mouth every morning.   levothyroxine  100 MCG tablet Commonly known as: SYNTHROID  Take 100 mcg by mouth daily.   MELATONIN PO Take 10-20 mg by mouth at bedtime as needed (sleep).   montelukast  10 MG tablet Commonly known as: SINGULAIR  Take 10 mg by mouth daily.   MSM 1000 MG Tabs Take 1,000 mg by mouth daily.   nicotine  polacrilex 2 MG gum Commonly known as: NICORETTE  Take 1 each (2 mg total) by mouth as needed for smoking cessation.   predniSONE  10 MG tablet Commonly known as: DELTASONE  Take 4 tablets (40 mg) daily for 2 days, then, Take 3 tablets (30 mg) daily for 2 days, then, Take 2 tablets (20 mg) daily for 2 days, then, Take 1 tablets (10 mg) daily for 1 days, then stop   rosuvastatin  20 MG tablet Commonly known as: CRESTOR  Take 20 mg by mouth daily.   Symbicort 160-4.5 MCG/ACT inhaler Generic drug: budesonide-formoterol  Inhale 2 puffs into the lungs 2 (two) times daily.         Follow-up Information  Catalina Bare, MD Follow up in 1 week(s).   Specialty: Internal Medicine Contact information: [address] [place] [address] [phone]                  Time coordinating discharge: 39 minutes  Signed:  Deshone Lyssy  [facility] 08/04/2023, 5:07 PM

## 2023-08-04 NOTE — Care Management CC44 (Signed)
 Condition Code 44 Documentation Completed  Patient Details  Name: Jocelyn [name] MRN: [id number] Date of Birth: May 24, 1961   Condition Code 44 given:  Yes Patient signature on Condition Code 44 notice:  Yes Documentation of 2 MD's agreement:  Yes Code 44 added to claim:  Yes    Sonda Manuella Quill, RN 08/04/2023, 2:21 PM

## 2023-08-04 NOTE — TOC Initial Note (Signed)
 Transition of Care [facility]) - Initial/Assessment Note    Patient Details  Name: Jocelyn [name] MRN: [id number] Date of Birth: October 20, 1960  Transition of [facility]) CM/SW Contact:    Sonda Manuella Quill, RN Phone Number: 08/04/2023, 2:28 PM  Clinical Narrative:                 [name] w/ pt in room; pt says she lives at home; she plans to return at d/c; pt identified POC husband Jocelyn [name]); pt says her husband will provide transportation; she verified insurance/PCP; pt denies SDOH risks; she has a shower chair, and aide thru [facility]; pt says she does not have home oxygen ; also explained Code 44/MOON to pt; she verbalized understanding and signed document; copy given to pt; no TOC needs.  Expected Discharge Plan: Home/Self Care Barriers to Discharge: No Barriers Identified   Patient Goals and CMS Choice Patient states their goals for this hospitalization and ongoing recovery are:: home CMS Medicare.gov Compare Post Acute Care list provided to:: Patient    ownership interest in [facility].provided to:: Patient    Expected Discharge Plan and Services   Discharge Planning Services: CM Consult Post Acute Care Choice: Resumption of Svcs/PTA Provider (aide w/ [facility]) Living arrangements for the past 2 months: Apartment Expected Discharge Date: 08/04/23               DME Arranged: N/A DME Agency: NA       HH Arranged: NA HH Agency: NA        Prior Living Arrangements/Services Living arrangements for the past 2 months: Apartment Lives with:: Self Patient language and need for interpreter reviewed:: Yes Do you feel safe going back to the place where you live?: Yes      Need for Family Participation in Patient Care: Yes (Comment) Care giver support system in place?: Yes (comment) Current home services: DME (shower chair) Criminal Activity/Legal Involvement Pertinent to Current Situation/Hospitalization: No - Comment as  needed  Activities of Daily Living   ADL Screening (condition at time of admission) Independently performs ADLs?: Yes (appropriate for developmental age) Is the patient deaf or have difficulty hearing?: No Does the patient have difficulty seeing, even when wearing glasses/contacts?: No Does the patient have difficulty concentrating, remembering, or making decisions?: No  Permission Sought/Granted Permission sought to share information with : Case Manager Permission granted to share information with : Yes, Verbal Permission Granted  Share Information with NAME: Case Manager     Permission granted to share info w Relationship: Jocelyn [name] (spouse) [phone]     Emotional Assessment Appearance:: Appears stated age Attitude/Demeanor/Rapport: Gracious Affect (typically observed): Accepting Orientation: : Oriented to Self, Oriented to Place, Oriented to  Time, Oriented to Situation Alcohol / Substance Use: Not Applicable Psych Involvement: No (comment)  Admission diagnosis:  Sepsis due to pneumonia (HCC) [J18.9, A41.9] Community acquired pneumonia of right middle lobe of lung [J18.9] Patient Active Problem List   Diagnosis Date Noted   Sepsis due to pneumonia (HCC) 08/03/2023   CKD (chronic kidney disease) 08/03/2023   Diarrhea 09/13/2022   Chronic constipation 09/13/2022   Sepsis (HCC) 09/03/2022   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   Cerebral infarction (HCC) 06/08/2021   COPD exacerbation (HCC) 05/16/2021   Acute on chronic respiratory failure with hypoxia (HCC) 05/16/2021   Hypokalemia 05/16/2021   AKI (acute kidney injury) (HCC) 05/16/2021   LV (left ventricular) mural thrombus 05/25/2020   Renal infarct (HCC) 05/16/2020   Hypoxia 05/16/2020  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   Proptosis 12/27/2019   Multinodular goiter 03/27/2019   Hypothyroidism 09/26/2018   Sacroiliitis, not elsewhere classified (HCC) 04/24/2017   Chronic right-sided low back pain with right-sided  sciatica 01/12/2017   Chronic bilateral thoracic back pain 01/12/2017   Other spondylosis with radiculopathy, lumbar region 10/19/2016   Depression with anxiety 07/16/2016   Chronic diastolic CHF (congestive heart failure) (HCC) 07/16/2016   Anxiety 04/06/2016   Aortic atherosclerosis (HCC) 11/24/2015   Unstable angina (HCC) 08/21/2015   Chest pain    Coronary artery disease    CAD (coronary artery disease), native coronary artery 01/14/2015   Hypertension 01/14/2015   Hypercholesterolemia 01/14/2015   Tobacco abuse 01/14/2015   PCP:  Catalina Bare, MD Pharmacy:   [facility] [phone] [address], [place] - [address] AT [facility] OF [address] [place] [address] Phone: [phone] Fax: [phone]  [facility] - [place], [place] - [address] [place] [address] Phone: [phone] Fax: [phone]     Social Drivers of Health (SDOH) Social History: SDOH Screenings   Food Insecurity: No Food Insecurity (08/04/2023)  Housing: Low Risk  (08/04/2023)  Transportation Needs: No Transportation Needs (08/04/2023)  Utilities: Not At Risk (08/04/2023)  Alcohol Screen: Low Risk  (07/01/2021)  Depression (PHQ2-9): Medium Risk (06/22/2021)  Financial Resource Strain: Low Risk  (07/01/2021)  Physical Activity: Inactive (07/01/2021)  Social Connections: Socially Isolated (07/01/2021)  Stress: Stress Concern Present (07/01/2021)  Tobacco Use: High Risk (08/03/2023)   SDOH Interventions: Food Insecurity Interventions: Intervention Not Indicated, Inpatient TOC Housing Interventions: Intervention Not Indicated, Inpatient TOC Transportation Interventions: Intervention Not Indicated, Inpatient TOC Utilities Interventions: Intervention Not Indicated, Inpatient TOC   Readmission Risk Interventions    05/21/2021   11:27 AM  Readmission Risk Prevention Plan  Post Dischage Appt Complete  Medication Screening  Complete  Transportation Screening Complete

## 2023-08-04 NOTE — Care Management Obs Status (Signed)
 MEDICARE OBSERVATION STATUS NOTIFICATION   Patient Details  Name: Jocelyn [name] MRN: [id number] Date of Birth: [birth year]   Medicare Observation Status Notification Given:  Yes    Adrian Prows, RN 08/04/2023, 2:19 PM

## 2023-08-04 NOTE — Plan of Care (Signed)

## 2023-08-07 ENCOUNTER — Ambulatory Visit: Payer: 59

## 2023-08-24 DIAGNOSIS — I2510 Atherosclerotic heart disease of native coronary artery without angina pectoris: Secondary | ICD-10-CM | POA: Diagnosis not present

## 2023-08-24 DIAGNOSIS — J441 Chronic obstructive pulmonary disease with (acute) exacerbation: Secondary | ICD-10-CM | POA: Diagnosis not present

## 2023-08-24 DIAGNOSIS — R7303 Prediabetes: Secondary | ICD-10-CM | POA: Diagnosis not present

## 2023-08-24 DIAGNOSIS — J3089 Other allergic rhinitis: Secondary | ICD-10-CM | POA: Diagnosis not present

## 2023-08-24 DIAGNOSIS — I10 Essential (primary) hypertension: Secondary | ICD-10-CM | POA: Diagnosis not present

## 2023-08-24 DIAGNOSIS — R051 Acute cough: Secondary | ICD-10-CM | POA: Diagnosis not present

## 2023-08-24 DIAGNOSIS — E785 Hyperlipidemia, unspecified: Secondary | ICD-10-CM | POA: Diagnosis not present

## 2023-08-28 ENCOUNTER — Ambulatory Visit (INDEPENDENT_AMBULATORY_CARE_PROVIDER_SITE_OTHER): Payer: 59

## 2023-08-28 DIAGNOSIS — I639 Cerebral infarction, unspecified: Secondary | ICD-10-CM | POA: Diagnosis not present

## 2023-08-28 LAB — CUP PACEART REMOTE DEVICE CHECK
Date Time Interrogation Session: 20250126231406
Implantable Pulse Generator Implant Date: 20221110

## 2023-08-29 ENCOUNTER — Encounter: Payer: Self-pay | Admitting: Cardiology

## 2023-08-31 NOTE — Progress Notes (Signed)
[facility] Summary Report / Loop Recorder

## 2023-09-11 ENCOUNTER — Ambulatory Visit: Payer: 59

## 2023-10-02 ENCOUNTER — Ambulatory Visit (INDEPENDENT_AMBULATORY_CARE_PROVIDER_SITE_OTHER): Payer: 59

## 2023-10-02 DIAGNOSIS — R7303 Prediabetes: Secondary | ICD-10-CM | POA: Diagnosis not present

## 2023-10-02 DIAGNOSIS — J3089 Other allergic rhinitis: Secondary | ICD-10-CM | POA: Diagnosis not present

## 2023-10-02 DIAGNOSIS — E785 Hyperlipidemia, unspecified: Secondary | ICD-10-CM | POA: Diagnosis not present

## 2023-10-02 DIAGNOSIS — I639 Cerebral infarction, unspecified: Secondary | ICD-10-CM | POA: Diagnosis not present

## 2023-10-02 DIAGNOSIS — I2510 Atherosclerotic heart disease of native coronary artery without angina pectoris: Secondary | ICD-10-CM | POA: Diagnosis not present

## 2023-10-02 DIAGNOSIS — I10 Essential (primary) hypertension: Secondary | ICD-10-CM | POA: Diagnosis not present

## 2023-10-02 DIAGNOSIS — Z0001 Encounter for general adult medical examination with abnormal findings: Secondary | ICD-10-CM | POA: Diagnosis not present

## 2023-10-02 DIAGNOSIS — J441 Chronic obstructive pulmonary disease with (acute) exacerbation: Secondary | ICD-10-CM | POA: Diagnosis not present

## 2023-10-02 LAB — CUP PACEART REMOTE DEVICE CHECK
Date Time Interrogation Session: 20250302230715
Implantable Pulse Generator Implant Date: 20221110

## 2023-10-03 ENCOUNTER — Encounter: Payer: Self-pay | Admitting: Cardiology

## 2023-10-06 NOTE — Progress Notes (Signed)
 Carelink Summary Report / Loop Recorder

## 2023-10-08 ENCOUNTER — Emergency Department (HOSPITAL_BASED_OUTPATIENT_CLINIC_OR_DEPARTMENT_OTHER)

## 2023-10-08 ENCOUNTER — Emergency Department (HOSPITAL_BASED_OUTPATIENT_CLINIC_OR_DEPARTMENT_OTHER)
Admission: EM | Admit: 2023-10-08 | Discharge: 2023-10-08 | Disposition: A | Discharge status: Home / Self Care | Attending: Emergency Medicine | Admitting: Emergency Medicine

## 2023-10-08 ENCOUNTER — Encounter (HOSPITAL_BASED_OUTPATIENT_CLINIC_OR_DEPARTMENT_OTHER): Payer: Self-pay | Admitting: Urology

## 2023-10-08 ENCOUNTER — Other Ambulatory Visit: Payer: Self-pay

## 2023-10-08 DIAGNOSIS — Z79899 Other long term (current) drug therapy: Secondary | ICD-10-CM | POA: Diagnosis not present

## 2023-10-08 DIAGNOSIS — R519 Headache, unspecified: Secondary | ICD-10-CM | POA: Insufficient documentation

## 2023-10-08 DIAGNOSIS — H539 Unspecified visual disturbance: Secondary | ICD-10-CM | POA: Diagnosis not present

## 2023-10-08 DIAGNOSIS — R251 Tremor, unspecified: Secondary | ICD-10-CM | POA: Diagnosis not present

## 2023-10-08 DIAGNOSIS — E876 Hypokalemia: Secondary | ICD-10-CM | POA: Insufficient documentation

## 2023-10-08 DIAGNOSIS — I10 Essential (primary) hypertension: Secondary | ICD-10-CM | POA: Diagnosis not present

## 2023-10-08 DIAGNOSIS — F410 Panic disorder [episodic paroxysmal anxiety] without agoraphobia: Secondary | ICD-10-CM | POA: Insufficient documentation

## 2023-10-08 LAB — CBC WITH DIFFERENTIAL/PLATELET
Abs Immature Granulocytes: 0 10*3/uL (ref 0.00–0.07)
Basophils Absolute: 0 10*3/uL (ref 0.0–0.1)
Basophils Relative: 1 %
Eosinophils Absolute: 0.1 10*3/uL (ref 0.0–0.5)
Eosinophils Relative: 1 %
HCT: 41.7 % (ref 36.0–46.0)
Hemoglobin: 13.9 g/dL (ref 12.0–15.0)
Immature Granulocytes: 0 %
Lymphocytes Relative: 51 %
Lymphs Abs: 3.4 10*3/uL (ref 0.7–4.0)
MCH: 29.1 pg (ref 26.0–34.0)
MCHC: 33.3 g/dL (ref 30.0–36.0)
MCV: 87.4 fL (ref 80.0–100.0)
Monocytes Absolute: 0.5 10*3/uL (ref 0.1–1.0)
Monocytes Relative: 8 %
Neutro Abs: 2.5 10*3/uL (ref 1.7–7.7)
Neutrophils Relative %: 39 %
Platelets: 253 10*3/uL (ref 150–400)
RBC: 4.77 MIL/uL (ref 3.87–5.11)
RDW: 14 % (ref 11.5–15.5)
WBC: 6.4 10*3/uL (ref 4.0–10.5)
nRBC: 0 % (ref 0.0–0.2)

## 2023-10-08 LAB — BASIC METABOLIC PANEL
Anion gap: 8 (ref 5–15)
BUN: 8 mg/dL (ref 8–23)
CO2: 28 mmol/L (ref 22–32)
Calcium: 9.4 mg/dL (ref 8.9–10.3)
Chloride: 106 mmol/L (ref 98–111)
Creatinine, Ser: 0.8 mg/dL (ref 0.44–1.00)
GFR, Estimated: >60 mL/min (ref >60)
Glucose, Bld: 129 mg/dL — ABNORMAL HIGH (ref 70–99)
Potassium: 3 mmol/L — ABNORMAL LOW (ref 3.5–5.1)
Sodium: 142 mmol/L (ref 135–145)

## 2023-10-08 MED ORDER — IOHEXOL 350 MG/ML SOLN
75.0000 mL | Freq: Once | INTRAVENOUS | Status: AC | PRN
  Administered 2023-10-08: 75 mL via INTRAVENOUS

## 2023-10-08 MED ORDER — POTASSIUM CHLORIDE CRYS ER 20 MEQ PO TBCR: 20.0000 meq | EXTENDED_RELEASE_TABLET | Freq: Every day | ORAL | 0 refills | Status: DC

## 2023-10-08 MED ORDER — KETOROLAC TROMETHAMINE 30 MG/ML IJ SOLN
30.0000 mg | Freq: Once | INTRAMUSCULAR | Status: AC
  Administered 2023-10-08: 30 mg via INTRAVENOUS
  Filled 2023-10-08: qty 1

## 2023-10-08 MED ORDER — SODIUM CHLORIDE 0.9 % IV BOLUS
500.0000 mL | Freq: Once | INTRAVENOUS | Status: AC
  Administered 2023-10-08: 500 mL via INTRAVENOUS

## 2023-10-08 MED ORDER — METOCLOPRAMIDE HCL 5 MG/ML IJ SOLN
5.0000 mg | Freq: Once | INTRAMUSCULAR | Status: AC
  Administered 2023-10-08: 5 mg via INTRAVENOUS
  Filled 2023-10-08: qty 2

## 2023-10-08 MED ORDER — DIPHENHYDRAMINE HCL 50 MG/ML IJ SOLN
12.5000 mg | Freq: Once | INTRAMUSCULAR | Status: AC
  Administered 2023-10-08: 12.5 mg via INTRAVENOUS
  Filled 2023-10-08: qty 1

## 2023-10-08 NOTE — ED Provider Notes (Signed)
 [place] [facility] AT [facility] Provider Note   CSN: [id number] Arrival date & time: 10/08/23  1625     History  Chief Complaint  Patient presents with   Vision Changes   Headache    Jocelyn [name] is a 63 y.o. female.  HPI   63 year old female presents to the emergency department with concern for sinus congestion, right-sided headache as well as some visual disturbances.  Patient states 4 days ago she developed sinus congestion, has been taking over-the-counter medication without significant relief.  Moving into Friday she developed a right sided head pain that became more severe yesterday.  Today while driving to church she noted that the road lines looked funny and squiggly.  She had no black or vision loss.  Even now while sitting in the room she states that some of the things on the walls look squiggly and not sharp and she feels like her depth perception is off.  This is in both eyes independently.  She denies any facial droop, speech changes, focal numbness or weakness.  She admits to severe anxiety and did have a panic attack earlier today.  Home Medications Prior to Admission medications   Medication Sig Start Date End Date Taking? Authorizing Provider  albuterol (PROVENTIL HFA;VENTOLIN HFA) 108 (90 Base) MCG/ACT inhaler Inhale 2 puffs into the lungs every 6 (six) hours as needed for wheezing or shortness of breath. 06/21/15 09/04/23  [provider]  albuterol (PROVENTIL) (2.5 MG/3ML) 0.083% nebulizer solution Take 3 mLs (2.5 mg total) by nebulization every 4 (four) hours as needed for wheezing or shortness of breath. 09/04/22   Tyrone Nine, MD  ALPRAZolam Prudy Feeler) 1 MG tablet Take 1 mg by mouth daily. 05/10/20   [provider]  cyclobenzaprine (FLEXERIL) 10 MG tablet Take by mouth 3 (three) times daily as needed. 06/30/15   [provider]  furosemide (LASIX) 40 MG tablet Take 40 mg by mouth daily.    [provider]   gabapentin (NEURONTIN) 300 MG capsule Take 300 mg by mouth at bedtime as needed (pain). 10/05/21   [provider]  guaiFENesin-dextromethorphan (ROBITUSSIN DM) 100-10 MG/5ML syrup Take 5 mLs by mouth every 4 (four) hours as needed for cough. 08/04/23   Pokhrel, Rebekah Chesterfield, MD  ibuprofen (ADVIL) 800 MG tablet Take 800 mg by mouth daily as needed. 09/01/22   [provider]  ipratropium-albuterol (DUONEB) 0.5-2.5 (3) MG/3ML SOLN Take 3 mLs by nebulization every 6 (six) hours as needed. Patient taking differently: Take 3 mLs by nebulization every 6 (six) hours as needed (shortness of breath, wheezing). 05/21/21   [name], Leana Roe, MD  isosorbide mononitrate (IMDUR) 30 MG 24 hr tablet Take 30 mg by mouth every morning. 07/06/23   [provider]  levothyroxine (SYNTHROID) 100 MCG tablet Take 100 mcg by mouth daily. 11/02/21   [provider]  MELATONIN PO Take 10-20 mg by mouth at bedtime as needed (sleep).    [provider]  Methylsulfonylmethane (MSM) 1000 MG TABS Take 1,000 mg by mouth daily.    [provider]  montelukast (SINGULAIR) 10 MG tablet Take 10 mg by mouth daily. 10/12/22   [provider]  nicotine polacrilex (NICORETTE) 2 MG gum Take 1 each (2 mg total) by mouth as needed for smoking cessation. 08/04/23   Pokhrel, Rebekah Chesterfield, MD  predniSONE (DELTASONE) 10 MG tablet Take 4 tablets (40 mg) daily for 2 days, then, Take 3 tablets (30 mg) daily for 2 days,  then, Take 2 tablets (20 mg) daily for 2 days, then, Take 1 tablets (10 mg) daily for 1 days, then stop 08/04/23   Pokhrel, Rebekah Chesterfield, MD  rosuvastatin (CRESTOR) 20 MG tablet Take 20 mg by mouth daily. 06/07/22   [provider]  SYMBICORT 160-4.5 MCG/ACT inhaler Inhale 2 puffs into the lungs 2 (two) times daily. 08/08/22   [provider]      Allergies    Hydrocodone    Review of Systems   Review of Systems  Constitutional:  Negative for fever.  Eyes:  Positive for visual  disturbance. Negative for pain.  Respiratory:  Negative for shortness of breath.   Cardiovascular:  Negative for chest pain.  Gastrointestinal:  Negative for abdominal pain, diarrhea and vomiting.  Musculoskeletal:  Negative for neck pain and neck stiffness.  Skin:  Negative for rash.  Neurological:  Positive for light-headedness and headaches. Negative for tremors, syncope, facial asymmetry, speech difficulty, weakness and numbness.    Physical Exam Updated Vital Signs BP 126/65   Pulse 94   Temp 98.4 F (36.9 C) (Oral)   Resp 18   Ht 5\' 4"  (1.626 m)   Wt 90.7 kg   SpO2 97%   BMI 34.32 kg/m  Physical Exam Vitals and nursing note reviewed.  Constitutional:      General: She is not in acute distress.    Appearance: Normal appearance.  HENT:     Head: Normocephalic.     Mouth/Throat:     Mouth: Mucous membranes are moist.  Eyes:     General: No visual field deficit. Cardiovascular:     Rate and Rhythm: Normal rate.  Pulmonary:     Effort: Pulmonary effort is normal. No respiratory distress.  Abdominal:     Palpations: Abdomen is soft.     Tenderness: There is no abdominal tenderness.  Skin:    General: Skin is warm.  Neurological:     Mental Status: She is alert and oriented to person, place, and time. Mental status is at baseline.     Cranial Nerves: No cranial nerve deficit, dysarthria or facial asymmetry.     Sensory: No sensory deficit.     Motor: No weakness.     Coordination: Romberg sign negative.  Psychiatric:        Mood and Affect: Mood normal.     ED Results / Procedures / Treatments   Labs (all labs ordered are listed, but only abnormal results are displayed) Labs Reviewed  BASIC METABOLIC PANEL - Abnormal; Notable for the following components:      Result Value   Potassium 3.0 (*)    Glucose, Bld 129 (*)    All other components within normal limits  CBC WITH DIFFERENTIAL/PLATELET    EKG EKG Interpretation Date/Time:  Sunday October 08 2023  16:43:31 EDT Ventricular Rate:  87 PR Interval:  183 QRS Duration:  93 QT Interval:  402 QTC Calculation: 484 R Axis:   60  Text Interpretation: Sinus rhythm RSR' in V1 or V2, right VCD or RVH Probable inferior infarct, old Lateral leads are also involved Confirmed by Coralee Pesa [phone]) on 10/08/2023 5:01:30 PM  Radiology No results found.  Procedures Procedures    Medications Ordered in ED Medications - No data to display  ED Course/ Medical Decision Making/ A&P  Medical Decision Making Amount and/or Complexity of Data Reviewed Labs: ordered. Radiology: ordered.  Risk Prescription drug management.   63 year old female presents emergency department with headache and visual disturbance.  Headache has been ongoing for the last 2 days and today was associated with some visual disturbances that was mentioned as shaking or waiting lines and change in depth perception.  No vision loss.  No other acute neurologic symptoms.  Vitals are normal and stable on arrival.  She is afebrile.  Blood work is baseline for the patient, no leukocytosis.  There is mild hypokalemia, we will address with oral supplementation.  CT of the head, CTA and CT venogram are negative for acute finding.  After migraine cocktail patient states that the headache is resolved.  Her visual disturbances are resolved as well.  Patient admits that she used to get headaches in the past but she was able to take medication and abort these.  She would have flashing lights as a visual aura at that time.  This could have been a complicated migraine.  I have placed an ambulatory referral to neurology for further evaluation and treatment.  I doubt acute stroke or other acute neurologic pathology at this time.  Patient at this time appears safe and stable for discharge and close outpatient follow up. Discharge plan and strict return to ED precautions discussed, patient verbalizes understanding  and agreement.        Final Clinical Impression(s) / ED Diagnoses Final diagnoses:  None    Rx / DC Orders ED Discharge Orders     None         Rozelle Logan, DO 10/08/23 2306

## 2023-10-08 NOTE — ED Triage Notes (Signed)
 Pt states since Thursday night started to have a headache on right side of head, on Friday started to have sinus congestion and pain  States when driving feels like the "lanes are closing up"  Vision changes this am on the way to church  Sob that started this am as well    H/o COPD and anxiety   RT at bedside, LS clear in all lobs

## 2023-10-08 NOTE — Discharge Instructions (Addendum)
 You have been seen and discharged from the emergency department.  I believe you suffered from a complicated migraine.  You are treated with IV medications.  Your CT imaging was normal.  I have placed an ambulatory referral to neurology.  Of note your potassium was slightly low today, I have sent you with a couple days of potassium supplement.  This can be rechecked by your primary doctor.  Follow-up with your primary provider for further evaluation and further care. Take home medications as prescribed. If you have any worsening symptoms, worsening severe headache, associated neurologic symptoms or further concerns for your health please return to an emergency department for further evaluation.

## 2023-10-09 DIAGNOSIS — R519 Headache, unspecified: Secondary | ICD-10-CM | POA: Diagnosis not present

## 2023-10-09 DIAGNOSIS — I2510 Atherosclerotic heart disease of native coronary artery without angina pectoris: Secondary | ICD-10-CM | POA: Diagnosis not present

## 2023-10-09 DIAGNOSIS — J3089 Other allergic rhinitis: Secondary | ICD-10-CM | POA: Diagnosis not present

## 2023-10-09 DIAGNOSIS — R7303 Prediabetes: Secondary | ICD-10-CM | POA: Diagnosis not present

## 2023-10-09 DIAGNOSIS — E785 Hyperlipidemia, unspecified: Secondary | ICD-10-CM | POA: Diagnosis not present

## 2023-10-09 DIAGNOSIS — I10 Essential (primary) hypertension: Secondary | ICD-10-CM | POA: Diagnosis not present

## 2023-10-09 DIAGNOSIS — J441 Chronic obstructive pulmonary disease with (acute) exacerbation: Secondary | ICD-10-CM | POA: Diagnosis not present

## 2023-10-09 DIAGNOSIS — Z0001 Encounter for general adult medical examination with abnormal findings: Secondary | ICD-10-CM | POA: Diagnosis not present

## 2023-10-16 ENCOUNTER — Ambulatory Visit: Payer: 59

## 2023-10-30 DIAGNOSIS — J3089 Other allergic rhinitis: Secondary | ICD-10-CM | POA: Diagnosis not present

## 2023-10-30 DIAGNOSIS — Z0001 Encounter for general adult medical examination with abnormal findings: Secondary | ICD-10-CM | POA: Diagnosis not present

## 2023-10-30 DIAGNOSIS — I2510 Atherosclerotic heart disease of native coronary artery without angina pectoris: Secondary | ICD-10-CM | POA: Diagnosis not present

## 2023-10-30 DIAGNOSIS — E785 Hyperlipidemia, unspecified: Secondary | ICD-10-CM | POA: Diagnosis not present

## 2023-10-30 DIAGNOSIS — R519 Headache, unspecified: Secondary | ICD-10-CM | POA: Diagnosis not present

## 2023-10-30 DIAGNOSIS — J441 Chronic obstructive pulmonary disease with (acute) exacerbation: Secondary | ICD-10-CM | POA: Diagnosis not present

## 2023-10-30 DIAGNOSIS — I10 Essential (primary) hypertension: Secondary | ICD-10-CM | POA: Diagnosis not present

## 2023-10-30 DIAGNOSIS — R7303 Prediabetes: Secondary | ICD-10-CM | POA: Diagnosis not present

## 2023-11-02 NOTE — Progress Notes (Signed)
 Carelink Summary Report / Loop Recorder

## 2023-11-06 ENCOUNTER — Ambulatory Visit (INDEPENDENT_AMBULATORY_CARE_PROVIDER_SITE_OTHER): Payer: 59

## 2023-11-06 DIAGNOSIS — I639 Cerebral infarction, unspecified: Secondary | ICD-10-CM | POA: Diagnosis not present

## 2023-11-06 LAB — CUP PACEART REMOTE DEVICE CHECK
Date Time Interrogation Session: 20250406230958
Implantable Pulse Generator Implant Date: 20221110

## 2023-11-11 ENCOUNTER — Encounter: Payer: Self-pay | Admitting: Cardiology

## 2023-11-13 ENCOUNTER — Encounter: Payer: Self-pay | Admitting: Neurology

## 2023-11-13 ENCOUNTER — Ambulatory Visit (INDEPENDENT_AMBULATORY_CARE_PROVIDER_SITE_OTHER): Admitting: Neurology

## 2023-11-13 VITALS — BP 115/71 | HR 85 | Ht 64.0 in | Wt 214.0 lb

## 2023-11-13 DIAGNOSIS — K5909 Other constipation: Secondary | ICD-10-CM | POA: Diagnosis not present

## 2023-11-13 DIAGNOSIS — E785 Hyperlipidemia, unspecified: Secondary | ICD-10-CM | POA: Diagnosis not present

## 2023-11-13 DIAGNOSIS — G3184 Mild cognitive impairment, so stated: Secondary | ICD-10-CM

## 2023-11-13 DIAGNOSIS — G441 Vascular headache, not elsewhere classified: Secondary | ICD-10-CM | POA: Diagnosis not present

## 2023-11-13 DIAGNOSIS — Z8673 Personal history of transient ischemic attack (TIA), and cerebral infarction without residual deficits: Secondary | ICD-10-CM | POA: Diagnosis not present

## 2023-11-13 DIAGNOSIS — J3089 Other allergic rhinitis: Secondary | ICD-10-CM | POA: Diagnosis not present

## 2023-11-13 DIAGNOSIS — I10 Essential (primary) hypertension: Secondary | ICD-10-CM | POA: Diagnosis not present

## 2023-11-13 DIAGNOSIS — J441 Chronic obstructive pulmonary disease with (acute) exacerbation: Secondary | ICD-10-CM | POA: Diagnosis not present

## 2023-11-13 DIAGNOSIS — I2510 Atherosclerotic heart disease of native coronary artery without angina pectoris: Secondary | ICD-10-CM | POA: Diagnosis not present

## 2023-11-13 DIAGNOSIS — R7303 Prediabetes: Secondary | ICD-10-CM | POA: Diagnosis not present

## 2023-11-13 DIAGNOSIS — R519 Headache, unspecified: Secondary | ICD-10-CM | POA: Diagnosis not present

## 2023-11-13 NOTE — Patient Instructions (Addendum)
 I had a long d/w patient about her new complaints of transient right hemicranial headaches, remote stroke, mild cognitive impairment, risk for recurrent stroke/TIAs, personally independently reviewed imaging studies and stroke evaluation results and answered questions.her headaches are not frequent enough and are very transient hence will not recommend specific medications.  I advised her to focus on stress relaxation strategies like meditation and yoga and regular exercise since headaches seem to be triggered by stress.  Continue aspirin 81 mg daily for stroke prevention and maintain strict control of hypertension with blood pressure goal below 130/90, diabetes with hemoglobin A1c goal below 6.5% and lipids with LDL cholesterol goal below 70 mg/dL. I also advised the patient to eat a healthy diet with plenty of whole grains, cereals, fruits and vegetables, exercise regularly and maintain ideal body weight .  I encouraged him to increase participation in cognitively challenging activities like solving crossword puzzles, playing bridge and sudoku.  We also discussed memory compensation strategies.  She will return for follow-up in the future only as needed and no scheduled appointment was made.

## 2023-11-13 NOTE — Progress Notes (Signed)
 [facility] [address] [place]. [place] [address] [phone]       OFFICE FOLLOW-UP NOTE  [name] Date of Birth:  06/09/1961 Medical Record Number:  [id number]   HPI: Initial visit 08/26/2021 Jocelyn [name] is a 63 year old pleasant African-American lady seen today for initial office follow-up visit following hospital admission for stroke in November 2022.  She is accompanied by her daughter and granddaughter.  History is obtained from them and review of electronic medical records and I have personally reviewed available pertinent imaging films and PACS.  She is a 63 year old lady with past medical history of hypertension, hyperlipidemia, hypothyroidism, coronary artery disease, prior LV thrombus was on anticoagulation for a short time, COPD remote smoking quit a month ago, anxiety, depression.  Patient presented on 06/08/2021 with sudden onset of speech difficulties which did not make sense.  She was brought in as a code stroke.  NIH stroke scale was 3 on admission initial head CT was negative for acute abnormalities.  CT angiogram of the brain and neck did not show any large vessel stenosis or occlusion.  MRI scan showed a small left temporoparietal cortical embolic infarct.  Hemoglobin A1c was 6.2.  LDL cholesterol is 1 1 2  mg percent.  Transthoracic echo showed resolution of the previous LV thrombus and no new clot or any other cardiac source of embolism.  Patient had a loop recorder placed and so far paroxysmal A. fib has not yet been found.  Speech symptoms resolved completely.  She states she is done well.  Only when she is tired towards the end of the day occasionally she may struggle with some words have some word finding difficulties.  She has finished outpatient speech therapy.  She has been placed by her cardiologist on aspirin and Plavix dual antiplatelet therapy which is tolerating well without any bruising or bleeding.  She is on Crestor and tolerating it well  without muscle aches and pains.  Her blood pressure is under good control today it is 114/74.  She has no new complaints today. Update 02/23/2022: She returns for follow-up after last visit 6 months ago.  She states she is doing well.  She has had no recurrent stroke or TIA symptoms.  Her speech is almost back to normal but rarely she has occasional word finding difficulties when she tries to talk fast or gets excited.  She knows that she needs to slow down while talking.  She remains on aspirin and Plavix but does complain of increasing bruising.  She did have remote history of cardiac stent in 2017 but it was 100% occluded.  She has had no recent cardiac symptoms or procedures.  She is concerned a little bit about her decreased hearing but she has not yet discussed this with her primary care physician.  She complains of mild posterior neck pain and does complain of some tightness in the neck and shoulder muscles.  She has not been doing any regular neck stretching exercises or any stress relaxation activities.  Her blood pressures under good control today it is 100/67.  She has not yet had lipid profile check a carotid ultrasound checked this year. Update 03/06/2023 : She returns for follow-up after last visit a year ago.  She continues to do well from stroke standpoint without recurrent stroke or TIA symptoms.  She remains on aspirin and Plavix tolerating well without bruising or bleeding.  She continues to have occasional word finding difficulties but does also complain of mild  short-term memory difficulties as well as difficulty with concentration and attention.  Loop recorder interrogation on 03/03/2023 did not show evidence of paroxysmal A-fib yet.  Follow-up carotid ultrasound on 03/11/2022 showed no significant extracranial stenosis.  An A1c on 09/03/2022 was 6.1.  She remains on Crestor which she is tolerating well without any side effects.  Last lipid profile on 02/23/2022 was satisfactory with LDL borderline at  79 mg percent.  Patient has been complaining of cognitive difficulties with particular short-term memory.  She has had trouble with attention and concentration.  She has noticed this all since her stroke.  Has not been getting better.  She denies feeling depressed.  There is no family history of Alzheimer's. Update 11/13/2023 : She returns for f/u after last visit  in August 2024 she is referred back to see me today by her primary physician Dr. Tobi Bastos for new headaches.  Patient states that for a month she started having intermittent right temporal headaches.  These are sharp and shooting in nature and very transient.  These seem to have improved in the last couple of weeks she is barely had 1 or 2 headaches.  The headaches were pretty bad about a month ago.  She had significant vision disturbance with the headache as well and felt as objects were moving around.  When she was driving to church she noted that the road lines looked funny and quickly.  She denied any loss of vision.  She did admit she had been significantly anxious and under stress which she thinks may have brought it on.  She was seen in the ED on 10/08/2023 and had CT scan of the head, CT angiogram of the brain and neck and CT venogram all of which were negative.  She was treated with migraine cocktail injections and felt that the headache is resolved and her subjective vision symptoms also resolved.  Patient states over the last few weeks she has had a few headaches which are mostly mostly been triggered by stress.  They have however been quite transient and she describes them as sharp shooting right temporal pain.  She is doing well from a stroke standpoint without recurrent stroke or TIA symptoms.  She states her blood pressure is under good control and today it is 115/71.  She remains on Crestor which she is tolerating well without muscle aches or pains.  She states her short-term memory and cognitive difficulties are stable and unchanged  and not progressive.  She is not keen to start any new medications at this time. ROS:   14 system review of systems is positive for speech difficulties, word finding difficulties, bruising all other systems negative  PMH:  Past Medical History:  Diagnosis Date   Anxiety    Arthritis    Chronic diastolic CHF (congestive heart failure), NYHA class 2 (HCC)    COPD (chronic obstructive pulmonary disease) (HCC)    Depression    HTN (hypertension)    Hypercholesterolemia    Hypothyroidism (acquired)    Kidney stones 2015   Myocardial infarction [facility])    x3- pt unsure if MIs were in 1996 or 1999, had stent fixed in 2016 but no MI at that time per pt   Stroke [facility]) 06/2021   Thyrotoxicosis    TIA (transient ischemic attack)    2013    Social History:  Social History   Socioeconomic History   Marital status: Married    Spouse name: Not on file   Number of  children: 2   Years of education: 12   Highest education level: 12th grade  Occupational History   Occupation: [employer]  Tobacco Use   Smoking status: Some Days    Current packs/day: 0.50    Types: Cigarettes    Passive exposure: Current   Smokeless tobacco: Never  Vaping Use   Vaping status: Some Days  Substance and Sexual Activity   Alcohol use: Yes    Comment: wine a few time a year   Drug use: No   Sexual activity: Not Currently  Other Topics Concern   Not on file  Social History Narrative   Right-handed.   Caffeine use: 5 cups per day.   Living with her daughter right now.   Social Drivers of Corporate investment banker Strain: Low Risk  (07/01/2021)   Overall Financial Resource Strain (CARDIA)    Difficulty of Paying Living Expenses: Not hard at all  Food Insecurity: No Food Insecurity (08/04/2023)   Hunger Vital Sign    Worried About Running Out of Food in the Last Year: Never true    Ran Out of Food in the Last Year: Never true  Transportation Needs: No Transportation Needs (08/04/2023)   PRAPARE -  Administrator, Civil Service (Medical): No    Lack of Transportation (Non-Medical): No  Physical Activity: Inactive (07/01/2021)   Exercise Vital Sign    Days of Exercise per Week: 0 days    Minutes of Exercise per Session: 0 min  Stress: Stress Concern Present (07/01/2021)   [employer] of Occupational Health - Occupational Stress Questionnaire    Feeling of Stress : Rather much  Social Connections: Socially Isolated (07/01/2021)   Social Connection and Isolation Panel [NHANES]    Frequency of Communication with Friends and Family: More than three times a week    Frequency of Social Gatherings with Friends and Family: More than three times a week    Attends Religious Services: Never    Database administrator or Organizations: No    Attends Banker Meetings: Never    Marital Status: Separated  Intimate Partner Violence: Not At Risk (08/04/2023)   Humiliation, Afraid, Rape, and Kick questionnaire    Fear of Current or Ex-Partner: No    Emotionally Abused: No    Physically Abused: No    Sexually Abused: No    Medications:   Current Outpatient Medications on File Prior to Visit  Medication Sig Dispense Refill   albuterol (PROVENTIL) (2.5 MG/3ML) 0.083% nebulizer solution Take 3 mLs (2.5 mg total) by nebulization every 4 (four) hours as needed for wheezing or shortness of breath. 75 mL 0   ALPRAZolam (XANAX) 1 MG tablet Take 1 mg by mouth daily.     aspirin EC 81 MG tablet Take 81 mg by mouth daily. Swallow whole.     furosemide (LASIX) 40 MG tablet Take 40 mg by mouth daily.     gabapentin (NEURONTIN) 300 MG capsule Take 300 mg by mouth at bedtime as needed (pain).     ibuprofen (ADVIL) 800 MG tablet Take 800 mg by mouth daily as needed.     ipratropium-albuterol (DUONEB) 0.5-2.5 (3) MG/3ML SOLN Take 3 mLs by nebulization every 6 (six) hours as needed. (Patient taking differently: Take 3 mLs by nebulization every 6 (six) hours as needed (shortness of breath,  wheezing).) 360 mL 1   isosorbide mononitrate (IMDUR) 30 MG 24 hr tablet Take 30 mg by mouth every morning.  levothyroxine (SYNTHROID) 100 MCG tablet Take 100 mcg by mouth daily.     MELATONIN PO Take 10-20 mg by mouth at bedtime as needed (sleep).     montelukast (SINGULAIR) 10 MG tablet Take 10 mg by mouth daily.     rosuvastatin (CRESTOR) 20 MG tablet Take 20 mg by mouth daily.     SYMBICORT 160-4.5 MCG/ACT inhaler Inhale 2 puffs into the lungs 2 (two) times daily.     albuterol (PROVENTIL HFA;VENTOLIN HFA) 108 (90 Base) MCG/ACT inhaler Inhale 2 puffs into the lungs every 6 (six) hours as needed for wheezing or shortness of breath.     cyclobenzaprine (FLEXERIL) 10 MG tablet Take by mouth 3 (three) times daily as needed. (Patient not taking: Reported on 11/13/2023)     guaiFENesin-dextromethorphan (ROBITUSSIN DM) 100-10 MG/5ML syrup Take 5 mLs by mouth every 4 (four) hours as needed for cough. (Patient not taking: Reported on 11/13/2023) 118 mL 0   Methylsulfonylmethane (MSM) 1000 MG TABS Take 1,000 mg by mouth daily. (Patient not taking: Reported on 11/13/2023)     nicotine polacrilex (NICORETTE) 2 MG gum Take 1 each (2 mg total) by mouth as needed for smoking cessation. (Patient not taking: Reported on 11/13/2023) 100 tablet 0   potassium chloride SA (KLOR-CON M) 20 MEQ tablet Take 1 tablet (20 mEq total) by mouth daily. (Patient not taking: Reported on 11/13/2023) 7 tablet 0   predniSONE (DELTASONE) 10 MG tablet Take 4 tablets (40 mg) daily for 2 days, then, Take 3 tablets (30 mg) daily for 2 days, then, Take 2 tablets (20 mg) daily for 2 days, then, Take 1 tablets (10 mg) daily for 1 days, then stop (Patient not taking: Reported on 11/13/2023) 19 tablet 0   No current facility-administered medications on file prior to visit.    Allergies:   Allergies  Allergen Reactions   Hydrocodone Itching    Has problems with high doses    Physical Exam General: Mildly obese middle-aged  African-American lady seated, in no evident distress Head: head normocephalic and atraumatic.  Neck: supple with no carotid or supraclavicular bruits Cardiovascular: regular rate and rhythm, no murmurs Musculoskeletal: no deformity Skin:  no rash/petichiae Vascular:  Normal pulses all extremities Vitals:   11/13/23 1115  BP: 115/71  Pulse: 85   Neurologic Exam Mental Status: Awake and fully alert. Oriented to place and time. Recent and remote memory intact. Attention span, concentration and fund of knowledge appropriate. Mood and affect appropriate.  Clock drawing 4/4.  On animal naming test scored 12..  Geriatric depression scale scored 6 not depressed.  Mini-Mental status exam not done ( last visit 29/30 ). Cranial Nerves: Fundoscopic exam not done. Pupils equal, briskly reactive to light. Extraocular movements full without nystagmus. Visual fields full to confrontation. Hearing intact. Facial sensation intact. Face, tongue, palate moves normally and symmetrically.  Motor: Normal bulk and tone. Normal strength in all tested extremity muscles. Sensory.: intact to touch ,pinprick .position and vibratory sensation.  Coordination: Rapid alternating movements normal in all extremities. Finger-to-nose and heel-to-shin performed accurately bilaterally. Gait and Station: Arises from chair without difficulty. Stance is normal. Gait demonstrates normal stride length and balance . Able to heel, toe and tandem walk with mild  difficulty.  Reflexes: 1+ and symmetric. Toes downgoing.   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  She is doing extremely well without any recurrent strokes.  Vascular risk factors of cardiomyopathy, hypertension and hyperlipidemia.   She also has mild poststroke cognitive impairment.   New onset of transient right hemicranial headaches which are very brief and infrequent and triggered by stress.     PLAN: I had a long d/w patient about her new complaints of transient right hemicranial headaches, remote stroke, mild cognitive impairment, risk for recurrent stroke/TIAs, personally independently reviewed imaging studies and stroke evaluation results and answered questions.her headaches are not frequent enough and are very transient hence will not recommend specific medications.  I advised her to focus on stress relaxation strategies like meditation and yoga and regular exercise since headaches seem to be triggered by stress.  Continue aspirin 81 mg daily for stroke prevention and maintain strict control of hypertension with blood pressure goal below 130/90, diabetes with hemoglobin A1c goal below 6.5% and lipids with LDL cholesterol goal below 70 mg/dL. I also advised the patient to eat a healthy diet with plenty of whole grains, cereals, fruits and vegetables, exercise regularly and maintain ideal body weight .  I encouraged him to increase participation in cognitively challenging activities like solving crossword puzzles, playing bridge and sudoku.  We also discussed memory compensation strategies.  She will return for follow-up in the future only as needed and no scheduled appointment was made. Greater than 50% of time during this 40 minute visit was spent on counseling,explanation of diagnosis of stroke and neck pain, planning of further management, discussion with patient and family and coordination of care Ardella Beaver, MD Note: This document was prepared with digital dictation and possible smart phrase technology. Any transcriptional errors that result from this process are unintentional

## 2023-11-28 DIAGNOSIS — I872 Venous insufficiency (chronic) (peripheral): Secondary | ICD-10-CM | POA: Diagnosis not present

## 2023-11-28 DIAGNOSIS — J441 Chronic obstructive pulmonary disease with (acute) exacerbation: Secondary | ICD-10-CM | POA: Diagnosis not present

## 2023-11-28 DIAGNOSIS — J3089 Other allergic rhinitis: Secondary | ICD-10-CM | POA: Diagnosis not present

## 2023-11-28 DIAGNOSIS — R519 Headache, unspecified: Secondary | ICD-10-CM | POA: Diagnosis not present

## 2023-11-28 DIAGNOSIS — I10 Essential (primary) hypertension: Secondary | ICD-10-CM | POA: Diagnosis not present

## 2023-11-28 DIAGNOSIS — I2510 Atherosclerotic heart disease of native coronary artery without angina pectoris: Secondary | ICD-10-CM | POA: Diagnosis not present

## 2023-11-28 DIAGNOSIS — R7303 Prediabetes: Secondary | ICD-10-CM | POA: Diagnosis not present

## 2023-11-28 DIAGNOSIS — E785 Hyperlipidemia, unspecified: Secondary | ICD-10-CM | POA: Diagnosis not present

## 2023-11-28 DIAGNOSIS — K5909 Other constipation: Secondary | ICD-10-CM | POA: Diagnosis not present

## 2023-12-05 DIAGNOSIS — Z79899 Other long term (current) drug therapy: Secondary | ICD-10-CM | POA: Diagnosis not present

## 2023-12-11 ENCOUNTER — Ambulatory Visit (INDEPENDENT_AMBULATORY_CARE_PROVIDER_SITE_OTHER): Payer: 59

## 2023-12-11 DIAGNOSIS — I639 Cerebral infarction, unspecified: Secondary | ICD-10-CM | POA: Diagnosis not present

## 2023-12-11 LAB — CUP PACEART REMOTE DEVICE CHECK
Date Time Interrogation Session: 20250511234150
Implantable Pulse Generator Implant Date: 20221110

## 2023-12-17 ENCOUNTER — Ambulatory Visit: Payer: Self-pay | Admitting: Cardiology

## 2023-12-18 DIAGNOSIS — J40 Bronchitis, not specified as acute or chronic: Secondary | ICD-10-CM | POA: Diagnosis not present

## 2023-12-18 DIAGNOSIS — R051 Acute cough: Secondary | ICD-10-CM | POA: Diagnosis not present

## 2023-12-21 NOTE — Progress Notes (Signed)
 Carelink Summary Report / Loop Recorder

## 2024-01-11 ENCOUNTER — Ambulatory Visit (INDEPENDENT_AMBULATORY_CARE_PROVIDER_SITE_OTHER)

## 2024-01-11 DIAGNOSIS — I639 Cerebral infarction, unspecified: Secondary | ICD-10-CM | POA: Diagnosis not present

## 2024-01-11 LAB — CUP PACEART REMOTE DEVICE CHECK
Date Time Interrogation Session: 20250611231627
Implantable Pulse Generator Implant Date: 20221110

## 2024-01-13 ENCOUNTER — Ambulatory Visit: Payer: Self-pay | Admitting: Cardiology

## 2024-01-15 ENCOUNTER — Ambulatory Visit: Payer: 59

## 2024-01-16 DIAGNOSIS — I10 Essential (primary) hypertension: Secondary | ICD-10-CM | POA: Diagnosis not present

## 2024-01-16 DIAGNOSIS — J441 Chronic obstructive pulmonary disease with (acute) exacerbation: Secondary | ICD-10-CM | POA: Diagnosis not present

## 2024-01-16 DIAGNOSIS — E785 Hyperlipidemia, unspecified: Secondary | ICD-10-CM | POA: Diagnosis not present

## 2024-01-16 DIAGNOSIS — R519 Headache, unspecified: Secondary | ICD-10-CM | POA: Diagnosis not present

## 2024-01-16 DIAGNOSIS — I2510 Atherosclerotic heart disease of native coronary artery without angina pectoris: Secondary | ICD-10-CM | POA: Diagnosis not present

## 2024-01-16 DIAGNOSIS — K5909 Other constipation: Secondary | ICD-10-CM | POA: Diagnosis not present

## 2024-01-16 DIAGNOSIS — I872 Venous insufficiency (chronic) (peripheral): Secondary | ICD-10-CM | POA: Diagnosis not present

## 2024-01-16 DIAGNOSIS — J3089 Other allergic rhinitis: Secondary | ICD-10-CM | POA: Diagnosis not present

## 2024-01-16 DIAGNOSIS — R7303 Prediabetes: Secondary | ICD-10-CM | POA: Diagnosis not present

## 2024-01-18 ENCOUNTER — Other Ambulatory Visit: Payer: Self-pay | Admitting: *Deleted

## 2024-01-18 DIAGNOSIS — I872 Venous insufficiency (chronic) (peripheral): Secondary | ICD-10-CM

## 2024-01-29 NOTE — Progress Notes (Signed)
 Carelink Summary Report / Loop Recorder

## 2024-01-31 NOTE — Progress Notes (Unsigned)
 Office Note     CC: Lower extremity edema Requesting Provider:  Rosalea Rosina SAILOR, PA  HPI: Jocelyn [name] is a 63 y.o. (07-07-1961) female presenting at the request of .Catalina Bare, MD lower extremity edema.  On exam, Jocelyn [name] was doing well.  A native of [place] , she has lived in [place] for a number of years due to her husband's job.  She has not been Transfer, and would like to move back to a small town, but understands that this comes with lack of access to healthcare and other amenities.  Roughly 3 months ago, Jocelyn [name] appreciated acute bilateral lower extremity edema, left greater than right.  This improved over the course of about a month.  Today in the office, she notes a small amount of edema at the left ankle.  The right leg is unaffected.  She also appreciates a small number of telangiectasias bilaterally.  Eczema denies significant heaviness, pain, tired feeling by days end.  She has worn compression stockings intermittently, but stated they were too tight.  No history of DVT, no history of venous procedures.  Denies symptoms of claudication, ischemic rest pain, tissue loss.    Past Medical History:  Diagnosis Date   Anxiety    Arthritis    Chronic diastolic CHF (congestive heart failure), NYHA class 2 (HCC)    COPD (chronic obstructive pulmonary disease) (HCC)    Depression    HTN (hypertension)    Hypercholesterolemia    Hypothyroidism (acquired)    Kidney stones 2015   Myocardial infarction [facility])    x3- pt unsure if MIs were in 1996 or 1999, had stent fixed in 2016 but no MI at that time per pt   Stroke [facility]) 06/2021   Thyrotoxicosis    TIA (transient ischemic attack)    2013    Past Surgical History:  Procedure Laterality Date   BREAST CYST EXCISION     BUBBLE STUDY  06/10/2021   Procedure: BUBBLE STUDY;  Surgeon: Pietro Redell RAMAN, MD;  Location: [facility] ENDOSCOPY;  Service: Cardiovascular;;   CARDIAC CATHETERIZATION N/A 08/24/2015    Procedure: Left Heart Cath and Coronary Angiography;  Surgeon: Candyce RAMAN Reek, MD;  Location: [facility] INVASIVE CV LAB;  Service: Cardiovascular;  Laterality: N/A;   COLONOSCOPY     CORONARY STENT PLACEMENT  1999   and then had procedure to repair in 2016   KNEE SURGERY     LOOP RECORDER INSERTION N/A 06/10/2021   Procedure: LOOP RECORDER INSERTION;  Surgeon: Kelsie Agent, MD;  Location: [facility] INVASIVE CV LAB;  Service: Cardiovascular;  Laterality: N/A;   partial hysterec     TEE WITHOUT CARDIOVERSION N/A 06/10/2021   Procedure: TRANSESOPHAGEAL ECHOCARDIOGRAM (TEE);  Surgeon: Pietro Redell RAMAN, MD;  Location: [facility] ENDOSCOPY;  Service: Cardiovascular;  Laterality: N/A;   THYROIDECTOMY      Social History   Socioeconomic History   Marital status: Married    Spouse name: Not on file   Number of children: 2   Years of education: 12   Highest education level: 12th grade  Occupational History   Occupation: [employer]  Tobacco Use   Smoking status: Some Days    Current packs/day: 0.50    Types: Cigarettes    Passive exposure: Current   Smokeless tobacco: Never  Vaping Use   Vaping status: Some Days  Substance and Sexual Activity   Alcohol use: Yes    Comment: wine a few time a year   Drug use: No  Sexual activity: Not Currently  Other Topics Concern   Not on file  Social History Narrative   Right-handed.   Caffeine use: 5 cups per day.   Living with her daughter right now.   Social Drivers of Corporate investment banker Strain: Low Risk  (07/01/2021)   Overall Financial Resource Strain (CARDIA)    Difficulty of Paying Living Expenses: Not hard at all  Food Insecurity: No Food Insecurity (08/04/2023)   Hunger Vital Sign    Worried About Running Out of Food in the Last Year: Never true    Ran Out of Food in the Last Year: Never true  Transportation Needs: No Transportation Needs (08/04/2023)   PRAPARE - Administrator, Civil Service (Medical): No    Lack of Transportation  (Non-Medical): No  Physical Activity: Inactive (07/01/2021)   Exercise Vital Sign    Days of Exercise per Week: 0 days    Minutes of Exercise per Session: 0 min  Stress: Stress Concern Present (07/01/2021)   [employer] of Occupational Health - Occupational Stress Questionnaire    Feeling of Stress : Rather much  Social Connections: Socially Isolated (07/01/2021)   Social Connection and Isolation Panel    Frequency of Communication with Friends and Family: More than three times a week    Frequency of Social Gatherings with Friends and Family: More than three times a week    Attends Religious Services: Never    Database administrator or Organizations: No    Attends Banker Meetings: Never    Marital Status: Separated  Intimate Partner Violence: Not At Risk (08/04/2023)   Humiliation, Afraid, Rape, and Kick questionnaire    Fear of Current or Ex-Partner: No    Emotionally Abused: No    Physically Abused: No    Sexually Abused: No   Family History  Problem Relation Age of Onset   Heart disease Mother    Stroke Mother    Hypertension Mother    Heart disease Father    Graves' disease Father    Colon cancer Father        pt unsure of age onset, believes he was in his 40s   Gout Brother    Gout Brother    Cancer Brother    Stroke Brother    Diabetes Maternal Grandmother    Hypertension Maternal Grandmother    Heart attack Neg Hx    Esophageal cancer Neg Hx    Stomach cancer Neg Hx    Rectal cancer Neg Hx     Current Outpatient Medications  Medication Sig Dispense Refill   albuterol  (PROVENTIL  HFA;VENTOLIN  HFA) 108 (90 Base) MCG/ACT inhaler Inhale 2 puffs into the lungs every 6 (six) hours as needed for wheezing or shortness of breath.     albuterol  (PROVENTIL ) (2.5 MG/3ML) 0.083% nebulizer solution Take 3 mLs (2.5 mg total) by nebulization every 4 (four) hours as needed for wheezing or shortness of breath. 75 mL 0   ALPRAZolam  (XANAX ) 1 MG tablet Take 1 mg by  mouth daily.     aspirin  EC 81 MG tablet Take 81 mg by mouth daily. Swallow whole.     cyclobenzaprine  (FLEXERIL ) 10 MG tablet Take by mouth 3 (three) times daily as needed. (Patient not taking: Reported on 11/13/2023)     furosemide  (LASIX ) 40 MG tablet Take 40 mg by mouth daily.     gabapentin  (NEURONTIN ) 300 MG capsule Take 300 mg by mouth at bedtime as needed (pain).  guaiFENesin -dextromethorphan  (ROBITUSSIN DM) 100-10 MG/5ML syrup Take 5 mLs by mouth every 4 (four) hours as needed for cough. (Patient not taking: Reported on 11/13/2023) 118 mL 0   ibuprofen  (ADVIL ) 800 MG tablet Take 800 mg by mouth daily as needed.     ipratropium-albuterol  (DUONEB) 0.5-2.5 (3) MG/3ML SOLN Take 3 mLs by nebulization every 6 (six) hours as needed. (Patient taking differently: Take 3 mLs by nebulization every 6 (six) hours as needed (shortness of breath, wheezing).) 360 mL 1   isosorbide  mononitrate (IMDUR ) 30 MG 24 hr tablet Take 30 mg by mouth every morning.     levothyroxine  (SYNTHROID ) 100 MCG tablet Take 100 mcg by mouth daily.     MELATONIN PO Take 10-20 mg by mouth at bedtime as needed (sleep).     Methylsulfonylmethane (MSM) 1000 MG TABS Take 1,000 mg by mouth daily. (Patient not taking: Reported on 11/13/2023)     montelukast  (SINGULAIR ) 10 MG tablet Take 10 mg by mouth daily.     nicotine  polacrilex (NICORETTE ) 2 MG gum Take 1 each (2 mg total) by mouth as needed for smoking cessation. (Patient not taking: Reported on 11/13/2023) 100 tablet 0   potassium chloride  SA (KLOR-CON  M) 20 MEQ tablet Take 1 tablet (20 mEq total) by mouth daily. (Patient not taking: Reported on 11/13/2023) 7 tablet 0   predniSONE  (DELTASONE ) 10 MG tablet Take 4 tablets (40 mg) daily for 2 days, then, Take 3 tablets (30 mg) daily for 2 days, then, Take 2 tablets (20 mg) daily for 2 days, then, Take 1 tablets (10 mg) daily for 1 days, then stop (Patient not taking: Reported on 11/13/2023) 19 tablet 0   rosuvastatin  (CRESTOR ) 20 MG  tablet Take 20 mg by mouth daily.     SYMBICORT 160-4.5 MCG/ACT inhaler Inhale 2 puffs into the lungs 2 (two) times daily.     No current facility-administered medications for this visit.    Allergies  Allergen Reactions   Hydrocodone  Itching    Has problems with high doses     REVIEW OF SYSTEMS:  [X]  denotes positive finding, [ ]  denotes negative finding Cardiac  Comments:  Chest pain or chest pressure:    Shortness of breath upon exertion:    Short of breath when lying flat:    Irregular heart rhythm:        Vascular    Pain in calf, thigh, or hip brought on by ambulation:    Pain in feet at night that wakes you up from your sleep:     Blood clot in your veins:    Leg swelling:         Pulmonary    Oxygen  at home:    Productive cough:     Wheezing:         Neurologic    Sudden weakness in arms or legs:     Sudden numbness in arms or legs:     Sudden onset of difficulty speaking or slurred speech:    Temporary loss of vision in one eye:     Problems with dizziness:         Gastrointestinal    Blood in stool:     Vomited blood:         Genitourinary    Burning when urinating:     Blood in urine:        Psychiatric    Major depression:         Hematologic    Bleeding problems:  Problems with blood clotting too easily:        Skin    Rashes or ulcers:        Constitutional    Fever or chills:      PHYSICAL EXAMINATION:  There were no vitals filed for this visit.  General:  WDWN in NAD; vital signs documented above Gait: Not observed HENT: WNL, normocephalic Pulmonary: normal non-labored breathing , without wheezing Cardiac: regular HR Abdomen: soft, NT, no masses Skin: without rashes Vascular Exam/Pulses:  Right Left  Radial 2+ (normal) 2+ (normal)  Ulnar    Femoral    Popliteal    DP 2+ (normal) 2+ (normal)  PT     Extremities: without ischemic changes, without Gangrene , without cellulitis; without open wounds;  Musculoskeletal: no  muscle wasting or atrophy  Neurologic: A&O X 3;  No focal weakness or paresthesias are detected Psychiatric:  The pt has Normal affect.   Non-Invasive Vascular Imaging:   +--------------+---------+------+-----------+------------+--------+  LEFT         Reflux NoRefluxReflux TimeDiameter cmsComments                          Yes                                   +--------------+---------+------+-----------+------------+--------+  CFV                    yes   >1 second                       +--------------+---------+------+-----------+------------+--------+  FV mid        no                                              +--------------+---------+------+-----------+------------+--------+  Popliteal    no                                              +--------------+---------+------+-----------+------------+--------+  GSV at SFJ              yes    >500 ms      0.87              +--------------+---------+------+-----------+------------+--------+  GSV prox thighno                            0.59              +--------------+---------+------+-----------+------------+--------+  GSV mid thigh no                            0.52              +--------------+---------+------+-----------+------------+--------+  GSV dist thighno                            0.46              +--------------+---------+------+-----------+------------+--------+  GSV at knee   no  0.5               +--------------+---------+------+-----------+------------+--------+  GSV prox calf           yes    >500 ms      0.43              +--------------+---------+------+-----------+------------+--------+  GSV mid calf  no                            0.34              +--------------+---------+------+-----------+------------+--------+  SSV at [facility]    no                            0.21               +--------------+---------+------+-----------+------------+--------+  SSV prox calf no                            0.19              +--------------+---------+------+-----------+------------+--------+       ASSESSMENT/PLAN: ZINNIA TINDALL is a 63 y.o. female presenting with history of bilateral lower extremity edema, left greater than right.  Fortunately, the edema has improved dramatically.  We discussed that edema has several different etiologies.    Acute lower extremity edema usually comes from cardiac and renal etiologies.  She does not have a recent echo, recent creatinine was normal.  Chronic edema can be caused from venous insufficiency. Left leg reflux ultrasound demonstrated small amount of reflux in the common femoral vein, greater saphenous vein, and in the greater saphenous vein at the calf.  She does not have classic symptoms such as leg heaviness, swelling, pain by days end.  No concerns for lymphedema or peripheral arterial insufficiency.  I had a long conversation with Jeniyah regarding the above.  I do think that she would benefit from bilateral lower extremity compression stockings.  We discussed that intervention is reserved for patients who are symptomatic.  Being that she is asymptomatic at this time, I do not think that she would appreciate any improvement with ablation.  I asked her to call my office should she have edema which waxes and wanes, as I am happy to see her back. She felt comfortable following up as needed at this time.   Jocelyn [name] FORBES Rim, MD [facility] [phone]

## 2024-02-01 ENCOUNTER — Ambulatory Visit: Attending: Vascular Surgery | Admitting: Vascular Surgery

## 2024-02-01 ENCOUNTER — Ambulatory Visit (HOSPITAL_COMMUNITY)
Admission: RE | Admit: 2024-02-01 | Discharge: 2024-02-01 | Disposition: A | Discharge status: Home / Self Care | Source: Ambulatory Visit | Attending: Vascular Surgery | Admitting: Vascular Surgery

## 2024-02-01 ENCOUNTER — Encounter: Payer: Self-pay | Admitting: Vascular Surgery

## 2024-02-01 VITALS — BP 101/69 | HR 82 | Temp 97.7°F | Resp 20 | Ht 64.0 in | Wt 209.0 lb

## 2024-02-01 DIAGNOSIS — I872 Venous insufficiency (chronic) (peripheral): Secondary | ICD-10-CM

## 2024-02-01 DIAGNOSIS — M7989 Other specified soft tissue disorders: Secondary | ICD-10-CM

## 2024-02-12 ENCOUNTER — Ambulatory Visit

## 2024-02-12 ENCOUNTER — Ambulatory Visit: Payer: Self-pay | Admitting: Cardiology

## 2024-02-12 DIAGNOSIS — I639 Cerebral infarction, unspecified: Secondary | ICD-10-CM | POA: Diagnosis not present

## 2024-02-12 LAB — CUP PACEART REMOTE DEVICE CHECK
Date Time Interrogation Session: 20250713233032
Implantable Pulse Generator Implant Date: 20221110

## 2024-02-19 ENCOUNTER — Ambulatory Visit: Payer: 59

## 2024-03-06 NOTE — Progress Notes (Signed)
 Carelink Summary Report / Loop Recorder

## 2024-03-14 ENCOUNTER — Ambulatory Visit (INDEPENDENT_AMBULATORY_CARE_PROVIDER_SITE_OTHER)

## 2024-03-14 DIAGNOSIS — I639 Cerebral infarction, unspecified: Secondary | ICD-10-CM

## 2024-03-14 LAB — CUP PACEART REMOTE DEVICE CHECK
Date Time Interrogation Session: 20250813231923
Implantable Pulse Generator Implant Date: 20221110

## 2024-03-15 ENCOUNTER — Ambulatory Visit: Payer: Self-pay | Admitting: Cardiology

## 2024-03-19 DIAGNOSIS — R519 Headache, unspecified: Secondary | ICD-10-CM | POA: Diagnosis not present

## 2024-03-19 DIAGNOSIS — I872 Venous insufficiency (chronic) (peripheral): Secondary | ICD-10-CM | POA: Diagnosis not present

## 2024-03-19 DIAGNOSIS — K5909 Other constipation: Secondary | ICD-10-CM | POA: Diagnosis not present

## 2024-03-19 DIAGNOSIS — J441 Chronic obstructive pulmonary disease with (acute) exacerbation: Secondary | ICD-10-CM | POA: Diagnosis not present

## 2024-03-19 DIAGNOSIS — I2510 Atherosclerotic heart disease of native coronary artery without angina pectoris: Secondary | ICD-10-CM | POA: Diagnosis not present

## 2024-03-19 DIAGNOSIS — I10 Essential (primary) hypertension: Secondary | ICD-10-CM | POA: Diagnosis not present

## 2024-03-19 DIAGNOSIS — E785 Hyperlipidemia, unspecified: Secondary | ICD-10-CM | POA: Diagnosis not present

## 2024-03-19 DIAGNOSIS — J3089 Other allergic rhinitis: Secondary | ICD-10-CM | POA: Diagnosis not present

## 2024-03-19 DIAGNOSIS — R7303 Prediabetes: Secondary | ICD-10-CM | POA: Diagnosis not present

## 2024-03-20 ENCOUNTER — Other Ambulatory Visit: Payer: Self-pay | Admitting: Physician Assistant

## 2024-03-20 DIAGNOSIS — Z1231 Encounter for screening mammogram for malignant neoplasm of breast: Secondary | ICD-10-CM

## 2024-03-25 ENCOUNTER — Ambulatory Visit: Payer: 59

## 2024-03-26 DIAGNOSIS — R519 Headache, unspecified: Secondary | ICD-10-CM | POA: Diagnosis not present

## 2024-03-26 DIAGNOSIS — J441 Chronic obstructive pulmonary disease with (acute) exacerbation: Secondary | ICD-10-CM | POA: Diagnosis not present

## 2024-03-26 DIAGNOSIS — K5909 Other constipation: Secondary | ICD-10-CM | POA: Diagnosis not present

## 2024-03-26 DIAGNOSIS — I2510 Atherosclerotic heart disease of native coronary artery without angina pectoris: Secondary | ICD-10-CM | POA: Diagnosis not present

## 2024-03-26 DIAGNOSIS — R7303 Prediabetes: Secondary | ICD-10-CM | POA: Diagnosis not present

## 2024-03-26 DIAGNOSIS — I10 Essential (primary) hypertension: Secondary | ICD-10-CM | POA: Diagnosis not present

## 2024-03-26 DIAGNOSIS — E785 Hyperlipidemia, unspecified: Secondary | ICD-10-CM | POA: Diagnosis not present

## 2024-03-26 DIAGNOSIS — I872 Venous insufficiency (chronic) (peripheral): Secondary | ICD-10-CM | POA: Diagnosis not present

## 2024-03-26 DIAGNOSIS — J3089 Other allergic rhinitis: Secondary | ICD-10-CM | POA: Diagnosis not present

## 2024-04-15 ENCOUNTER — Ambulatory Visit

## 2024-04-15 DIAGNOSIS — I639 Cerebral infarction, unspecified: Secondary | ICD-10-CM

## 2024-04-15 LAB — CUP PACEART REMOTE DEVICE CHECK
Date Time Interrogation Session: 20250913230932
Implantable Pulse Generator Implant Date: 20221110

## 2024-04-18 ENCOUNTER — Ambulatory Visit: Payer: Self-pay | Admitting: Cardiology

## 2024-04-19 NOTE — Progress Notes (Signed)
 Remote Loop Recorder Transmission

## 2024-04-25 NOTE — Progress Notes (Signed)
 Remote Loop Recorder Transmission

## 2024-04-29 ENCOUNTER — Ambulatory Visit: Payer: 59

## 2024-05-08 NOTE — Progress Notes (Signed)
 Remote Loop Recorder Transmission

## 2024-05-14 ENCOUNTER — Ambulatory Visit (INDEPENDENT_AMBULATORY_CARE_PROVIDER_SITE_OTHER)

## 2024-05-14 DIAGNOSIS — I639 Cerebral infarction, unspecified: Secondary | ICD-10-CM

## 2024-05-14 LAB — CUP PACEART REMOTE DEVICE CHECK
Date Time Interrogation Session: 20251013231029
Implantable Pulse Generator Implant Date: 20221110

## 2024-05-15 ENCOUNTER — Ambulatory Visit: Payer: Self-pay | Admitting: Cardiology

## 2024-05-15 DIAGNOSIS — I110 Hypertensive heart disease with heart failure: Secondary | ICD-10-CM | POA: Diagnosis not present

## 2024-05-15 DIAGNOSIS — J3089 Other allergic rhinitis: Secondary | ICD-10-CM | POA: Diagnosis not present

## 2024-05-15 DIAGNOSIS — K5909 Other constipation: Secondary | ICD-10-CM | POA: Diagnosis not present

## 2024-05-15 DIAGNOSIS — I5032 Chronic diastolic (congestive) heart failure: Secondary | ICD-10-CM | POA: Diagnosis not present

## 2024-05-15 DIAGNOSIS — I872 Venous insufficiency (chronic) (peripheral): Secondary | ICD-10-CM | POA: Diagnosis not present

## 2024-05-15 DIAGNOSIS — R7303 Prediabetes: Secondary | ICD-10-CM | POA: Diagnosis not present

## 2024-05-15 DIAGNOSIS — I2510 Atherosclerotic heart disease of native coronary artery without angina pectoris: Secondary | ICD-10-CM | POA: Diagnosis not present

## 2024-05-15 DIAGNOSIS — J441 Chronic obstructive pulmonary disease with (acute) exacerbation: Secondary | ICD-10-CM | POA: Diagnosis not present

## 2024-05-15 NOTE — Progress Notes (Signed)
 Remote Loop Recorder Transmission

## 2024-05-16 ENCOUNTER — Ambulatory Visit

## 2024-05-28 ENCOUNTER — Telehealth: Payer: Self-pay

## 2024-05-28 DIAGNOSIS — K5909 Other constipation: Secondary | ICD-10-CM | POA: Diagnosis not present

## 2024-05-28 DIAGNOSIS — J4489 Other specified chronic obstructive pulmonary disease: Secondary | ICD-10-CM | POA: Diagnosis not present

## 2024-05-28 DIAGNOSIS — R7303 Prediabetes: Secondary | ICD-10-CM | POA: Diagnosis not present

## 2024-05-28 DIAGNOSIS — I2510 Atherosclerotic heart disease of native coronary artery without angina pectoris: Secondary | ICD-10-CM | POA: Diagnosis not present

## 2024-05-28 DIAGNOSIS — J3089 Other allergic rhinitis: Secondary | ICD-10-CM | POA: Diagnosis not present

## 2024-05-28 DIAGNOSIS — I110 Hypertensive heart disease with heart failure: Secondary | ICD-10-CM | POA: Diagnosis not present

## 2024-05-28 DIAGNOSIS — I5032 Chronic diastolic (congestive) heart failure: Secondary | ICD-10-CM | POA: Diagnosis not present

## 2024-05-28 DIAGNOSIS — I872 Venous insufficiency (chronic) (peripheral): Secondary | ICD-10-CM | POA: Diagnosis not present

## 2024-05-28 NOTE — Progress Notes (Signed)
 05/28/2024 Name: Jocelyn [name] MRN: [id number] DOB: Sep 30, 1960  Chief Complaint  Patient presents with   Medication Management    Jocelyn [name] is a 63 y.o. year old female who was referred for medication management by their primary care provider, Catalina Bare, MD. They presented for a face to face visit today.   They were referred to the pharmacist by their PCP for assistance in managing complex medication management    Subjective:  Care Team: Primary Care Provider: Catalina Bare, MD ; Next Scheduled Visit: *** {careteamprovider:27366}  Medication Access/Adherence  Current Pharmacy:  [id number] [address], [place] - [address] [place] [address] Phone: [phone] Fax: [phone]  [facility] - [place], [place] - [address] [place] [address] Phone: [phone] Fax: [phone]   Patient reports affordability concerns with their medications: {YES/NO:21197} Patient reports access/transportation concerns to their pharmacy: {YES/NO:21197} Patient reports adherence concerns with their medications:  {YES/NO:21197} ***   Medication Management:  Current adherence strategy: ***  Patient reports {Good Fair Poor:[phone]} adherence to medications  Patient reports the following barriers to adherence: ***  Recent fill dates:    Objective:  Lab Results  Component Value Date   HGBA1C 6.1 (H) 09/03/2022    Lab Results  Component Value Date   CREATININE 0.80 10/08/2023   BUN 8 10/08/2023   NA 142 10/08/2023   K 3.0 (L) 10/08/2023   CL 106 10/08/2023   CO2 28 10/08/2023    Lab Results  Component Value Date   CHOL 183 02/23/2022   HDL 82 02/23/2022   LDLCALC 79 02/23/2022   TRIG 128 02/23/2022   CHOLHDL 2.2 02/23/2022    Medications Reviewed Today     Reviewed by Graylon Keen, [facility] (Pharmacist) on 05/28/24 at 1452  Med  List Status: <None>   Medication Order Taking? Sig Documenting Provider Last Dose Status Informant  albuterol  (PROVENTIL  HFA;VENTOLIN  HFA) 108 (90 Base) MCG/ACT inhaler [id number] Yes Inhale 2 puffs into the lungs every 6 (six) hours as needed for wheezing or shortness of breath. [provider]  Active Self, Pharmacy Records           Med Note ARLETTE, EDSEL CHRISTELLA   Fri May 29, 2020 11:14 AM)    albuterol  (PROVENTIL ) (2.5 MG/3ML) 0.083% nebulizer solution [id number] Yes Take 3 mLs (2.5 mg total) by nebulization every 4 (four) hours as needed for wheezing or shortness of breath. Bryn Bernardino NOVAK, MD  Active Self, Pharmacy Records  ALPRAZolam  (XANAX ) 1 MG tablet [id number] Yes Take 1 mg by mouth daily.  Patient taking differently: Take 1 mg by mouth 2 (two) times daily as needed.   [provider]  Active Self, Pharmacy Records           Med Note ARLETTE EDSEL CHRISTELLA   Fri May 29, 2020 11:14 AM)    aspirin  EC 81 MG tablet [id number] Yes Take 81 mg by mouth daily. Swallow whole. [provider]  Active Self  clobetasol (TEMOVATE) 0.05 % external solution [id number]  Apply 1 Application topically 2 (two) times daily.  Patient not taking: Reported on 05/28/2024   [provider]  Active   clopidogrel  (PLAVIX ) 75 MG tablet [id number] Yes Take 75 mg by mouth daily. [provider]  Active    Patient not taking:   Discontinued 05/28/24 1442 (No longer needed (for PRN medications)) furosemide  (LASIX )  40 MG tablet [id number] Yes Take 40 mg by mouth daily.  Patient taking differently: Take 20 mg by mouth daily.   [provider]  Active Self, Pharmacy Records  gabapentin  (NEURONTIN ) 300 MG capsule [id number]  Take 300 mg by mouth at bedtime as needed (pain). [provider]  Active Self, Pharmacy Records  guaiFENesin -dextromethorphan  (ROBITUSSIN DM) 100-10 MG/5ML syrup [id number]  Take 5 mLs by mouth every 4 (four) hours as needed for cough.  Patient not  taking: Reported on 02/01/2024   Pokhrel, Laxman, MD  Active   hydrOXYzine (VISTARIL) 25 MG capsule [id number]  Take 25 mg by mouth at bedtime.  Patient not taking: Reported on 05/28/2024   [provider]  Active     Discontinued 05/28/24 1409 (Patient has not taken in last 30 days) ipratropium-albuterol  (DUONEB) 0.5-2.5 (3) MG/3ML SOLN [id number] Yes Take 3 mLs by nebulization every 6 (six) hours as needed. [name], Brayton RAMAN, MD  Active Self, Pharmacy Records  isosorbide  mononitrate (IMDUR ) 30 MG 24 hr tablet [id number] Yes Take 30 mg by mouth every morning. [provider]  Active Self, Pharmacy Records  levothyroxine  (SYNTHROID ) 100 MCG tablet [id number] Yes Take 100 mcg by mouth daily. [provider]  Active Self, Pharmacy Records  LINZESS  145 MCG CAPS capsule [id number]  Take 145 mcg by mouth daily.  Patient not taking: Reported on 05/28/2024   [provider]  Active   MELATONIN PO [id number] Yes Take 10-20 mg by mouth at bedtime as needed (sleep). [provider]  Active Self, Pharmacy Records  meloxicam (MOBIC) 15 MG tablet [id number]  1 tab(s) orally once a day; Duration: 30 days  Patient not taking: Reported on 05/28/2024   [provider]  Active   Methylsulfonylmethane (MSM) 1000 MG TABS [id number]  Take 1,000 mg by mouth daily.  Patient not taking: Reported on 02/01/2024   [provider]  Active Self, Pharmacy Records           Med Note [facility] [place], [place] A   Thu Aug 03, 2023  7:29 PM)    montelukast  (SINGULAIR ) 10 MG tablet [id number] Yes Take 10 mg by mouth daily. [provider]  Active Self, Pharmacy Records  nicotine  polacrilex (NICORETTE ) 2 MG gum [id number]  Take 1 each (2 mg total) by mouth as needed for smoking cessation.  Patient not taking: Reported on 05/28/2024   Sonjia Held, MD  Active    Patient not taking:   Discontinued 05/28/24 1409 (Patient has not taken in last 30 days)   predniSONE   (DELTASONE ) 10 MG tablet [id number]  Take 4 tablets (40 mg) daily for 2 days, then, Take 3 tablets (30 mg) daily for 2 days, then, Take 2 tablets (20 mg) daily for 2 days, then, Take 1 tablets (10 mg) daily for 1 days, then stop  Patient not taking: Reported on 02/01/2024   Sonjia Held, MD  Active   rosuvastatin  (CRESTOR ) 20 MG tablet [id number] Yes Take 20 mg by mouth daily. [provider]  Active Self, Pharmacy Records  SYMBICORT 160-4.5 MCG/ACT inhaler [id number] Yes Inhale 2 puffs into the lungs 2 (two) times daily.  Patient taking differently: Inhale 2 puffs into the lungs 2 (two) times daily.   [provider]  Active Self, Pharmacy Records  triamcinolone  cream (KENALOG) 0.5 % [id number] Yes Apply 0.5 Applications topically as needed. [provider]  Active               Assessment/Plan:   Medication  Management: - Currently strategy {sufficient/insufficient:26424} to maintain appropriate adherence to prescribed medication regimen - Suggested use of weekly pill box to organize medications - Created list of medication, indication, and administration time. Provided to patient - Discussed collaboration with local pharmacies for adherence packaging. Reviewed local pharmacies with adherence packaging options. Patient elects to ***    Follow Up Plan: ***  ***

## 2024-05-30 ENCOUNTER — Ambulatory Visit

## 2024-05-31 DIAGNOSIS — N644 Mastodynia: Secondary | ICD-10-CM | POA: Diagnosis not present

## 2024-06-03 ENCOUNTER — Encounter: Payer: Self-pay | Admitting: Physician Assistant

## 2024-06-04 ENCOUNTER — Other Ambulatory Visit: Payer: Self-pay | Admitting: Physician Assistant

## 2024-06-04 DIAGNOSIS — N644 Mastodynia: Secondary | ICD-10-CM

## 2024-06-06 ENCOUNTER — Ambulatory Visit

## 2024-06-06 ENCOUNTER — Ambulatory Visit
Admission: RE | Admit: 2024-06-06 | Discharge: 2024-06-06 | Disposition: A | Discharge status: Home / Self Care | Source: Ambulatory Visit | Attending: Physician Assistant | Admitting: Physician Assistant

## 2024-06-06 DIAGNOSIS — N644 Mastodynia: Secondary | ICD-10-CM

## 2024-06-14 ENCOUNTER — Ambulatory Visit (INDEPENDENT_AMBULATORY_CARE_PROVIDER_SITE_OTHER)

## 2024-06-14 DIAGNOSIS — I639 Cerebral infarction, unspecified: Secondary | ICD-10-CM | POA: Diagnosis not present

## 2024-06-14 LAB — CUP PACEART REMOTE DEVICE CHECK
Date Time Interrogation Session: 20251113231027
Implantable Pulse Generator Implant Date: 20221110

## 2024-06-17 ENCOUNTER — Ambulatory Visit

## 2024-06-18 ENCOUNTER — Ambulatory Visit: Payer: Self-pay | Admitting: Cardiology

## 2024-06-18 NOTE — Progress Notes (Signed)
 Remote Loop Recorder Transmission

## 2024-07-01 ENCOUNTER — Ambulatory Visit

## 2024-07-15 ENCOUNTER — Ambulatory Visit

## 2024-07-15 DIAGNOSIS — I639 Cerebral infarction, unspecified: Secondary | ICD-10-CM | POA: Diagnosis not present

## 2024-07-16 LAB — CUP PACEART REMOTE DEVICE CHECK
Date Time Interrogation Session: 20251214231447
Implantable Pulse Generator Implant Date: 20221110

## 2024-07-19 ENCOUNTER — Ambulatory Visit: Payer: Self-pay | Admitting: Cardiology

## 2024-07-19 NOTE — Progress Notes (Signed)
 Remote Loop Recorder Transmission

## 2024-07-23 ENCOUNTER — Telehealth: Payer: Self-pay

## 2024-07-23 NOTE — Progress Notes (Signed)
" ° °  07/23/2024  Patient ID: Jocelyn [name], female   DOB: [birth year], 63 y.o.   MRN: [id number]  Contacted patient regarding referral for medication management from Osei-Bonsu, Zachary, MD .   I was unable to leave a HIPAA compliant voicemail for the patient due to her number being disconnected. I will try to reach the patient again in the new year.   Heather Factor, PharmD Clinical Pharmacist  [phone]    "

## 2024-08-01 ENCOUNTER — Ambulatory Visit

## 2024-08-12 ENCOUNTER — Emergency Department (HOSPITAL_BASED_OUTPATIENT_CLINIC_OR_DEPARTMENT_OTHER)
Admission: EM | Admit: 2024-08-12 | Discharge: 2024-08-12 | Disposition: A | Discharge status: Home / Self Care | Attending: Emergency Medicine | Admitting: Emergency Medicine

## 2024-08-12 ENCOUNTER — Encounter (HOSPITAL_BASED_OUTPATIENT_CLINIC_OR_DEPARTMENT_OTHER): Payer: Self-pay | Admitting: Emergency Medicine

## 2024-08-12 ENCOUNTER — Emergency Department (HOSPITAL_BASED_OUTPATIENT_CLINIC_OR_DEPARTMENT_OTHER)

## 2024-08-12 ENCOUNTER — Other Ambulatory Visit: Payer: Self-pay

## 2024-08-12 DIAGNOSIS — R059 Cough, unspecified: Secondary | ICD-10-CM | POA: Diagnosis present

## 2024-08-12 DIAGNOSIS — J441 Chronic obstructive pulmonary disease with (acute) exacerbation: Secondary | ICD-10-CM | POA: Diagnosis not present

## 2024-08-12 DIAGNOSIS — Z7902 Long term (current) use of antithrombotics/antiplatelets: Secondary | ICD-10-CM | POA: Diagnosis not present

## 2024-08-12 DIAGNOSIS — Z7982 Long term (current) use of aspirin: Secondary | ICD-10-CM | POA: Insufficient documentation

## 2024-08-12 DIAGNOSIS — I509 Heart failure, unspecified: Secondary | ICD-10-CM | POA: Diagnosis not present

## 2024-08-12 LAB — COMPREHENSIVE METABOLIC PANEL WITH GFR
ALT: 8 U/L (ref 0–44)
AST: 17 U/L (ref 15–41)
Albumin: 3.9 g/dL (ref 3.5–5.0)
Alkaline Phosphatase: 72 U/L (ref 38–126)
Anion gap: 11 (ref 5–15)
BUN: <5 mg/dL — ABNORMAL LOW (ref 8–23)
CO2: 26 mmol/L (ref 22–32)
Calcium: 9.1 mg/dL (ref 8.9–10.3)
Chloride: 106 mmol/L (ref 98–111)
Creatinine, Ser: 0.61 mg/dL (ref 0.44–1.00)
GFR, Estimated: >60 mL/min
Glucose, Bld: 94 mg/dL (ref 70–99)
Potassium: 3.3 mmol/L — ABNORMAL LOW (ref 3.5–5.1)
Sodium: 143 mmol/L (ref 135–145)
Total Bilirubin: 0.4 mg/dL (ref 0.0–1.2)
Total Protein: 6.4 g/dL — ABNORMAL LOW (ref 6.5–8.1)

## 2024-08-12 LAB — CBC WITH DIFFERENTIAL/PLATELET
Abs Immature Granulocytes: 0.01 K/uL (ref 0.00–0.07)
Basophils Absolute: 0 K/uL (ref 0.0–0.1)
Basophils Relative: 1 %
Eosinophils Absolute: 0.1 K/uL (ref 0.0–0.5)
Eosinophils Relative: 1 %
HCT: 40.1 % (ref 36.0–46.0)
Hemoglobin: 13.8 g/dL (ref 12.0–15.0)
Immature Granulocytes: 0 %
Lymphocytes Relative: 59 %
Lymphs Abs: 3.6 K/uL (ref 0.7–4.0)
MCH: 29.6 pg (ref 26.0–34.0)
MCHC: 34.4 g/dL (ref 30.0–36.0)
MCV: 86.1 fL (ref 80.0–100.0)
Monocytes Absolute: 0.5 K/uL (ref 0.1–1.0)
Monocytes Relative: 8 %
Neutro Abs: 1.9 K/uL (ref 1.7–7.7)
Neutrophils Relative %: 31 %
Platelets: 244 K/uL (ref 150–400)
RBC: 4.66 MIL/uL (ref 3.87–5.11)
RDW: 13.6 % (ref 11.5–15.5)
WBC: 6.1 K/uL (ref 4.0–10.5)
nRBC: 0 % (ref 0.0–0.2)

## 2024-08-12 LAB — RESP PANEL BY RT-PCR (RSV, FLU A&B, COVID)  RVPGX2
Influenza A by PCR: NEGATIVE
Influenza B by PCR: NEGATIVE
Resp Syncytial Virus by PCR: NEGATIVE
SARS Coronavirus 2 by RT PCR: NEGATIVE

## 2024-08-12 LAB — PRO BRAIN NATRIURETIC PEPTIDE: Pro Brain Natriuretic Peptide: 230 pg/mL

## 2024-08-12 LAB — TROPONIN T, HIGH SENSITIVITY: Troponin T High Sensitivity: <15 ng/L (ref 0–19)

## 2024-08-12 MED ORDER — IPRATROPIUM-ALBUTEROL 0.5-2.5 (3) MG/3ML IN SOLN: 3.0000 mL | Freq: Four times a day (QID) | RESPIRATORY_TRACT | 1 refills | Status: AC | PRN

## 2024-08-12 MED ORDER — IPRATROPIUM-ALBUTEROL 0.5-2.5 (3) MG/3ML IN SOLN
RESPIRATORY_TRACT | Status: AC
  Administered 2024-08-12: 3 mL
  Filled 2024-08-12: qty 3

## 2024-08-12 MED ORDER — DOXYCYCLINE HYCLATE 100 MG PO CAPS: 100.0000 mg | ORAL_CAPSULE | Freq: Two times a day (BID) | ORAL | 0 refills | Status: AC

## 2024-08-12 MED ORDER — PREDNISONE 10 MG PO TABS: 40.0000 mg | ORAL_TABLET | Freq: Every day | ORAL | 0 refills | Status: AC

## 2024-08-12 MED ORDER — ALBUTEROL SULFATE (2.5 MG/3ML) 0.083% IN NEBU
INHALATION_SOLUTION | RESPIRATORY_TRACT | Status: AC
  Administered 2024-08-12: 2.5 mg
  Filled 2024-08-12: qty 3

## 2024-08-12 MED ORDER — PREDNISONE 50 MG PO TABS
60.0000 mg | ORAL_TABLET | Freq: Once | ORAL | Status: AC
  Administered 2024-08-12: 60 mg via ORAL
  Filled 2024-08-12: qty 1

## 2024-08-12 MED ORDER — DOXYCYCLINE HYCLATE 100 MG PO TABS
100.0000 mg | ORAL_TABLET | Freq: Once | ORAL | Status: AC
  Administered 2024-08-12: 100 mg via ORAL
  Filled 2024-08-12: qty 1

## 2024-08-12 NOTE — ED Triage Notes (Signed)
 Cough and sob since last Tuesday

## 2024-08-12 NOTE — ED Notes (Signed)

## 2024-08-12 NOTE — ED Provider Notes (Addendum)
 " [place] [facility] Provider Note   CSN: [id number] Arrival date & time: 08/12/24  1749     Patient presents with: Cough and Shortness of Breath   Jocelyn [name] is a 64 y.o. female.    Cough Associated symptoms: shortness of breath   Shortness of Breath Associated symptoms: cough      64 year old female with medical history significant for COPD, CHF (last EF 40 to 45% in 2022) who presents to the emergency department with a cough since this past Tuesday, productive of sputum initially yellow and then subsequently green.  She denies any fevers or chills.  She denies any chest pain.  Had mild shortness of breath.  Denies any sore throat, nasal congestion.  Prior to Admission medications  Medication Sig Start Date End Date Taking? Authorizing Provider  doxycycline  (VIBRAMYCIN ) 100 MG capsule Take 1 capsule (100 mg total) by mouth 2 (two) times daily for 5 days. 08/12/24 08/17/24 Yes Jerrol Agent, MD  predniSONE  (DELTASONE ) 10 MG tablet Take 4 tablets (40 mg total) by mouth daily for 5 days. 08/12/24 08/17/24 Yes Jerrol Agent, MD  albuterol  (PROVENTIL  HFA;VENTOLIN  HFA) 108 (90 Base) MCG/ACT inhaler Inhale 2 puffs into the lungs every 6 (six) hours as needed for wheezing or shortness of breath. 06/21/15 05/28/24  [provider]  ALPRAZolam  (XANAX ) 1 MG tablet Take 1 mg by mouth daily. Patient taking differently: Take 1 mg by mouth 2 (two) times daily as needed. 05/10/20   [provider]  aspirin  EC 81 MG tablet Take 81 mg by mouth daily. Swallow whole.    [provider]  clobetasol (TEMOVATE) 0.05 % external solution Apply 1 Application topically 2 (two) times daily. Patient not taking: Reported on 05/28/2024 04/08/24   [provider]  clopidogrel  (PLAVIX ) 75 MG tablet Take 75 mg by mouth daily.    [provider]  furosemide  (LASIX ) 40 MG tablet Take 40 mg by mouth daily. Patient taking differently:  Take 20 mg by mouth daily.    [provider]  hydrOXYzine (VISTARIL) 25 MG capsule Take 25 mg by mouth at bedtime. Patient not taking: Reported on 05/28/2024    [provider]  ipratropium-albuterol  (DUONEB) 0.5-2.5 (3) MG/3ML SOLN Take 3 mLs by nebulization every 6 (six) hours as needed. 08/12/24   Jerrol Agent, MD  isosorbide  mononitrate (IMDUR ) 30 MG 24 hr tablet Take 30 mg by mouth every morning. 07/06/23   [provider]  levocetirizine (XYZAL) 5 MG tablet Take 5 mg by mouth every evening.    [provider]  levothyroxine  (SYNTHROID ) 100 MCG tablet Take 100 mcg by mouth daily. 11/02/21   [provider]  LINZESS  145 MCG CAPS capsule Take 145 mcg by mouth daily. Patient not taking: Reported on 05/28/2024    [provider]  MELATONIN PO Take 10-20 mg by mouth at bedtime as needed (sleep).    [provider]  meloxicam (MOBIC) 15 MG tablet 1 tab(s) orally once a day; Duration: 30 days Patient not taking: Reported on 05/28/2024    [provider]  montelukast  (SINGULAIR ) 10 MG tablet Take 10 mg by mouth daily. 10/12/22   [provider]  nicotine  polacrilex (NICORETTE ) 2 MG gum Take 1 each (2 mg total) by mouth as needed for smoking cessation. Patient not taking: Reported on 05/28/2024 08/04/23   Pokhrel, Laxman, MD  rosuvastatin  (CRESTOR ) 20 MG tablet Take 20 mg by mouth daily. 06/07/22   [provider]  SYMBICORT 160-4.5 MCG/ACT inhaler Inhale 2 puffs into the lungs 2 (two) times daily. 08/08/22   [provider]  triamcinolone  cream (KENALOG) 0.5 % Apply 0.5 Applications topically as needed. 04/06/16   [provider]    Allergies: Hydrocodone     Review of Systems  Respiratory:  Positive for cough and shortness of breath.   All other systems reviewed and are negative.   Updated Vital Signs BP 101/61 (BP Location: Right Arm)   Pulse 79   Temp 97.9 F (36.6 C) (Oral)   Resp (!) 22    Ht 5' 4 (1.626 m)   Wt 99.8 kg   SpO2 96%   BMI 37.76 kg/m   Physical Exam Vitals and nursing note reviewed.  Constitutional:      General: She is not in acute distress.    Appearance: She is well-developed.  HENT:     Head: Normocephalic and atraumatic.  Eyes:     Conjunctiva/sclera: Conjunctivae normal.  Cardiovascular:     Rate and Rhythm: Normal rate and regular rhythm.     Heart sounds: No murmur heard. Pulmonary:     Effort: Pulmonary effort is normal. No respiratory distress.     Breath sounds: Wheezing present.  Abdominal:     Palpations: Abdomen is soft.     Tenderness: There is no abdominal tenderness.  Musculoskeletal:        General: No swelling.     Cervical back: Neck supple.  Skin:    General: Skin is warm and dry.     Capillary Refill: Capillary refill takes less than 2 seconds.  Neurological:     Mental Status: She is alert.  Psychiatric:        Mood and Affect: Mood normal.     (all labs ordered are listed, but only abnormal results are displayed) Labs Reviewed  COMPREHENSIVE METABOLIC PANEL WITH GFR - Abnormal; Notable for the following components:      Result Value   Potassium 3.3 (*)    BUN <5 (*)    Total Protein 6.4 (*)    All other components within normal limits  RESP PANEL BY RT-PCR (RSV, FLU A&B, COVID)  [id number]  CBC WITH DIFFERENTIAL/PLATELET  PRO BRAIN NATRIURETIC PEPTIDE  TROPONIN T, HIGH SENSITIVITY    EKG: EKG Interpretation Date/Time:  Monday August 12 2024 18:08:42 EST Ventricular Rate:  86 PR Interval:  142 QRS Duration:  89 QT Interval:  408 QTC Calculation: 488 R Axis:   57  Text Interpretation: Sinus rhythm Probable left atrial enlargement Abnormal R-wave progression, early transition Borderline prolonged QT interval Confirmed by Jerrol Agent ([id number]) on 08/12/2024 6:12:14 PM  Radiology: DG Chest 2 View Result Date: 08/12/2024 CLINICAL DATA:  Cough and short of breath EXAM: CHEST - 2 VIEW COMPARISON:  08/03/2023  FINDINGS: Resolution of previously noted right upper lobe opacity. Electronic recording device over the left chest. No acute airspace disease, pleural effusion or pneumothorax. Normal cardiomediastinal silhouette. IMPRESSION: No active cardiopulmonary disease. Resolution of previously noted right upper lobe opacity. Electronically Signed   By: Luke Bun M.D.   On: 08/12/2024 19:45     Procedures   Medications Ordered in the ED  doxycycline  (VIBRA -TABS) tablet 100 mg (has no administration in time range)  predniSONE  (DELTASONE ) tablet 60 mg (has no administration in time range)  ipratropium-albuterol  (DUONEB) 0.5-2.5 (3) MG/3ML nebulizer solution (3 mLs  Given 08/12/24 1815)  albuterol  (PROVENTIL ) (2.5 MG/3ML) 0.083% nebulizer solution (2.5 mg  Given 08/12/24 1815)  Medical Decision Making Amount and/or Complexity of Data Reviewed Labs: ordered. Radiology: ordered.  Risk Prescription drug management.    64 year old female with medical history significant for COPD, CHF (last EF 40 to 45% in 2022) who presents to the emergency department with a cough since this past Tuesday, productive of sputum initially yellow and then subsequently green.  She denies any fevers or chills.  She denies any chest pain.  Had mild shortness of breath.  Denies any sore throat, nasal congestion.  On arrival, the patient was afebrile, not tachycardic, tachypneic RR 22, BP 101/61, saturating 96% on room air.  On exam the patient had faint expiratory wheezing present, presenting with almost 1 week of cough.  Differential includes COPD exacerbation, viral infection, pneumonia.  She had wheezing in triage and was administered a DuoNeb with RT.  Feeling symptomatically improved.  EKG: Sinus rhythm, ventricular rate 86, borderline prolonged QT at 488, no acute ischemic changes.  CXR:  IMPRESSION:  No active cardiopulmonary disease. Resolution of previously noted  right upper  lobe opacity.    Labs: Troponin normal, BNP normal, CBC without a leukocytosis or anemia, CMP with mild hypokalemia to 3.3 in the setting of albuterol  use, otherwise unremarkable.  Overall well-appearing, saturating well on room air, symptomatically with very mild wheezing.  Suspect likely mild COPD exacerbation.  Will discharge on steroids and doxycycline , advised close follow-up outpatient with her primary care provider, return precautions provided.     Final diagnoses:  COPD exacerbation [facility])    ED Discharge Orders          Ordered    predniSONE  (DELTASONE ) 10 MG tablet  Daily        08/12/24 1951    doxycycline  (VIBRAMYCIN ) 100 MG capsule  2 times daily        08/12/24 1951    ipratropium-albuterol  (DUONEB) 0.5-2.5 (3) MG/3ML SOLN  Every 6 hours PRN        08/12/24 1951               Jerrol Agent, MD 08/12/24 DESMA    Jerrol Agent, MD 08/12/24 2005  "

## 2024-08-12 NOTE — Discharge Instructions (Addendum)
 Chest x-ray showed no evidence of pneumonia.  Labs were overall reassuring to include negative COVID and flu.  Will treat you for mild COPD exacerbation with doxycycline  and prednisone .  Continue with outpatient nebulizers.

## 2024-08-15 ENCOUNTER — Ambulatory Visit

## 2024-08-15 DIAGNOSIS — I639 Cerebral infarction, unspecified: Secondary | ICD-10-CM

## 2024-08-15 LAB — CUP PACEART REMOTE DEVICE CHECK
Date Time Interrogation Session: 20260114231015
Implantable Pulse Generator Implant Date: 20221110

## 2024-08-18 ENCOUNTER — Ambulatory Visit: Payer: Self-pay | Admitting: Cardiology

## 2024-08-23 NOTE — Progress Notes (Signed)
 Remote Loop Recorder Transmission

## 2024-09-02 ENCOUNTER — Ambulatory Visit

## 2024-09-15 ENCOUNTER — Ambulatory Visit

## 2024-10-03 ENCOUNTER — Ambulatory Visit

## 2024-10-16 ENCOUNTER — Ambulatory Visit

## 2024-11-16 ENCOUNTER — Ambulatory Visit
